# Patient Record
Sex: Female | Born: 1935
Health system: Southern US, Community
[De-identification: ages and names within clinical notes are randomized; demographics above are authoritative.]

## PROBLEM LIST (undated history)

## (undated) DIAGNOSIS — I493 Ventricular premature depolarization: Secondary | ICD-10-CM

## (undated) DIAGNOSIS — E785 Hyperlipidemia, unspecified: Secondary | ICD-10-CM

## (undated) DIAGNOSIS — E039 Hypothyroidism, unspecified: Secondary | ICD-10-CM

## (undated) DIAGNOSIS — I2510 Atherosclerotic heart disease of native coronary artery without angina pectoris: Secondary | ICD-10-CM

## (undated) DIAGNOSIS — I779 Disorder of arteries and arterioles, unspecified: Secondary | ICD-10-CM

## (undated) DIAGNOSIS — I447 Left bundle-branch block, unspecified: Secondary | ICD-10-CM

## (undated) DIAGNOSIS — R55 Syncope and collapse: Secondary | ICD-10-CM

## (undated) DIAGNOSIS — I5022 Chronic systolic (congestive) heart failure: Secondary | ICD-10-CM

## (undated) HISTORY — PX: KNEE ARTHROSCOPY: SHX127

## (undated) HISTORY — PX: FRACTURE SURGERY: SHX138

## (undated) HISTORY — PX: CATARACT EXTRACTION W/ INTRAOCULAR LENS  IMPLANT, BILATERAL: SHX1307

## (undated) HISTORY — PX: APPENDECTOMY: SHX54

## (undated) HISTORY — PX: BACK SURGERY: SHX140

## (undated) HISTORY — DX: Atherosclerotic heart disease of native coronary artery without angina pectoris: I25.10

## (undated) HISTORY — PX: WRIST FRACTURE SURGERY: SHX121

## (undated) HISTORY — DX: Hypothyroidism, unspecified: E03.9

## (undated) HISTORY — PX: OVARIAN CYST SURGERY: SHX726

## (undated) HISTORY — PX: CORONARY ANGIOPLASTY WITH STENT PLACEMENT: SHX49

---

## 1976-05-04 HISTORY — PX: LUMBAR LAMINECTOMY: SHX95

## 1998-11-21 ENCOUNTER — Other Ambulatory Visit: Admission: RE | Admit: 1998-11-21 | Discharge: 1998-11-21 | Payer: Self-pay | Admitting: Obstetrics and Gynecology

## 1998-12-30 ENCOUNTER — Encounter: Payer: Self-pay | Admitting: Cardiovascular Disease

## 1998-12-30 ENCOUNTER — Ambulatory Visit (HOSPITAL_COMMUNITY): Admission: RE | Admit: 1998-12-30 | Discharge: 1998-12-30 | Payer: Self-pay | Admitting: Cardiovascular Disease

## 1999-01-01 ENCOUNTER — Ambulatory Visit: Admission: RE | Admit: 1999-01-01 | Discharge: 1999-01-01 | Payer: Self-pay | Admitting: Cardiovascular Disease

## 1999-01-10 ENCOUNTER — Ambulatory Visit (HOSPITAL_COMMUNITY): Admission: RE | Admit: 1999-01-10 | Discharge: 1999-01-10 | Payer: Self-pay | Admitting: Cardiovascular Disease

## 2000-12-21 ENCOUNTER — Other Ambulatory Visit: Admission: RE | Admit: 2000-12-21 | Discharge: 2000-12-21 | Payer: Self-pay | Admitting: Obstetrics and Gynecology

## 2001-05-16 ENCOUNTER — Ambulatory Visit (HOSPITAL_COMMUNITY): Admission: RE | Admit: 2001-05-16 | Discharge: 2001-05-16 | Payer: Self-pay | Admitting: Gastroenterology

## 2003-11-13 ENCOUNTER — Inpatient Hospital Stay (HOSPITAL_COMMUNITY): Admission: EM | Admit: 2003-11-13 | Discharge: 2003-11-15 | Payer: Self-pay | Admitting: Emergency Medicine

## 2005-01-28 ENCOUNTER — Encounter: Admission: RE | Admit: 2005-01-28 | Discharge: 2005-01-28 | Payer: Self-pay | Admitting: Internal Medicine

## 2007-08-22 ENCOUNTER — Encounter: Admission: RE | Admit: 2007-08-22 | Discharge: 2007-08-22 | Payer: Self-pay | Admitting: Orthopedic Surgery

## 2007-09-01 ENCOUNTER — Encounter: Admission: RE | Admit: 2007-09-01 | Discharge: 2007-09-01 | Payer: Self-pay | Admitting: Orthopedic Surgery

## 2007-09-02 ENCOUNTER — Ambulatory Visit (HOSPITAL_BASED_OUTPATIENT_CLINIC_OR_DEPARTMENT_OTHER): Admission: RE | Admit: 2007-09-02 | Discharge: 2007-09-03 | Payer: Self-pay | Admitting: Orthopedic Surgery

## 2007-12-08 ENCOUNTER — Inpatient Hospital Stay (HOSPITAL_COMMUNITY): Admission: EM | Admit: 2007-12-08 | Discharge: 2007-12-09 | Payer: Self-pay | Admitting: Emergency Medicine

## 2007-12-08 ENCOUNTER — Other Ambulatory Visit: Payer: Self-pay | Admitting: Orthopedic Surgery

## 2008-03-08 ENCOUNTER — Ambulatory Visit (HOSPITAL_COMMUNITY): Admission: RE | Admit: 2008-03-08 | Discharge: 2008-03-08 | Payer: Self-pay | Admitting: Internal Medicine

## 2008-12-15 ENCOUNTER — Encounter: Admission: RE | Admit: 2008-12-15 | Discharge: 2008-12-15 | Payer: Self-pay | Admitting: Sports Medicine

## 2009-05-08 ENCOUNTER — Ambulatory Visit (HOSPITAL_COMMUNITY): Admission: RE | Admit: 2009-05-08 | Discharge: 2009-05-08 | Payer: Self-pay | Admitting: Internal Medicine

## 2009-12-31 ENCOUNTER — Ambulatory Visit: Payer: Self-pay | Admitting: Cardiovascular Disease

## 2010-01-23 ENCOUNTER — Observation Stay (HOSPITAL_COMMUNITY): Admission: EM | Admit: 2010-01-23 | Discharge: 2010-01-24 | Payer: Self-pay | Admitting: Emergency Medicine

## 2010-05-12 ENCOUNTER — Ambulatory Visit (HOSPITAL_COMMUNITY)
Admission: RE | Admit: 2010-05-12 | Discharge: 2010-05-12 | Payer: Self-pay | Source: Home / Self Care | Attending: Internal Medicine | Admitting: Internal Medicine

## 2010-07-17 ENCOUNTER — Ambulatory Visit: Payer: Self-pay | Admitting: Cardiovascular Disease

## 2010-07-17 LAB — PROTIME-INR
INR: 0.93 (ref 0.00–1.49)
Prothrombin Time: 12.7 seconds (ref 11.6–15.2)

## 2010-07-17 LAB — DIFFERENTIAL
Basophils Absolute: 0 10*3/uL (ref 0.0–0.1)
Lymphocytes Relative: 18 % (ref 12–46)
Monocytes Absolute: 0.4 10*3/uL (ref 0.1–1.0)
Neutro Abs: 5.7 10*3/uL (ref 1.7–7.7)
Neutrophils Relative %: 77 % (ref 43–77)

## 2010-07-17 LAB — BASIC METABOLIC PANEL
BUN: 10 mg/dL (ref 6–23)
Calcium: 9.3 mg/dL (ref 8.4–10.5)
GFR calc non Af Amer: >60 mL/min (ref >60)
Potassium: 3.7 mEq/L (ref 3.5–5.1)
Sodium: 142 mEq/L (ref 135–145)

## 2010-07-17 LAB — CBC
HCT: 37.3 % (ref 36.0–46.0)
RDW: 15.4 % (ref 11.5–15.5)
WBC: 7.4 10*3/uL (ref 4.0–10.5)

## 2010-07-17 LAB — TSH: TSH: 0.659 u[IU]/mL (ref 0.350–4.500)

## 2010-08-11 ENCOUNTER — Encounter: Payer: Self-pay | Admitting: Cardiovascular Disease

## 2010-08-11 ENCOUNTER — Ambulatory Visit (INDEPENDENT_AMBULATORY_CARE_PROVIDER_SITE_OTHER): Payer: Medicare Other | Admitting: Cardiovascular Disease

## 2010-08-11 DIAGNOSIS — I2510 Atherosclerotic heart disease of native coronary artery without angina pectoris: Secondary | ICD-10-CM | POA: Insufficient documentation

## 2010-08-11 DIAGNOSIS — E039 Hypothyroidism, unspecified: Secondary | ICD-10-CM | POA: Insufficient documentation

## 2010-08-11 DIAGNOSIS — E785 Hyperlipidemia, unspecified: Secondary | ICD-10-CM

## 2010-08-11 NOTE — Progress Notes (Signed)
History of Present Illness:  Susan [name] is an elderly female with a history of coronary artery disease-status post PTCA and stenting of her right coronary artery. She has a moderate stenosis in her proximal left anterior descending artery. She also has a history of Hypothyroidism.  She denies any episodes of chest pain or shortness breath. She still plays tennis on a regular basis.  Current Outpatient Prescriptions  Medication Sig Dispense Refill  . aspirin 81 MG tablet Take 81 mg by mouth daily.        . Calcium Carbonate-Vitamin D (CALCIUM + D PO) Take by mouth 2 (two) times daily.        . clopidogrel (PLAVIX) 75 MG tablet Take 75 mg by mouth daily.        . fish oil-omega-3 fatty acids 1000 MG capsule Take by mouth daily.        Marland Kitchen levothyroxine (SYNTHROID, LEVOTHROID) 50 MCG tablet Take 50 mcg by mouth daily.        . Multiple Vitamins-Minerals (PRESERVISION/LUTEIN PO) Take by mouth 2 (two) times daily.        . nitroGLYCERIN (NITROSTAT) 0.4 MG SL tablet Place 0.4 mg under the tongue every 5 (five) minutes as needed.        . rosuvastatin (CRESTOR) 20 MG tablet Take 20 mg by mouth daily.        Marland Kitchen venlafaxine (EFFEXOR-XR) 150 MG 24 hr capsule Take 150 mg by mouth daily.        . vitamin B-12 (CYANOCOBALAMIN) 1000 MCG tablet Take 1,000 mcg by mouth daily.        . Zoledronic Acid (RECLAST IV) Inject into the vein daily. YEARLY         No Known Allergies  Past Medical History  Diagnosis Date  . Coronary artery disease     POST PTCA AND STENTING OF HER RIGHT CORONARY ARTERY  . LAD stenosis     MODERATE 50-60% STENOSIS  . Hypothyroidism     Past Surgical History  Procedure Date  . Knee surgery     History  Smoking status  . Never Smoker   Smokeless tobacco  . Not on file    History  Alcohol Use No    Family History  Problem Relation Age of Onset  . Alzheimer's disease Mother   . Heart attack Father   . Aneurysm Sister     Reviw of Systems:  The patient denies any  heat or cold intolerance.  No weight gain or weight loss.  The patient denies headaches or blurry vision.  There is no cough or sputum production.  The patient denies dizziness.  There is no hematuria or hematochezia.  The patient denies any muscle aches or arthritis.  The patient denies any rash.  The patient denies frequent falling or instability.  There is no history of depression or anxiety.  All other systems were reviewed and are negative.  Physical Exam: BP 130/70  Pulse 76  Ht 5\' 3"  (1.6 m)  Wt 133 lb 3.2 oz (60.419 kg)  BMI 23.60 kg/m2 The patient is alert and oriented x 3.  The mood and affect are normal.  The skin is warm and dry.  Color is normal.  The HEENT exam reveals that the sclera are nonicteric.  The mucous membranes are moist.  The carotids are 2+ without bruits.  There is no thyromegaly.  There is no JVD.  The lungs are clear.  The chest wall is non tender.  The  heart exam reveals a regular rate with a normal S1 and S2.  There are no murmurs, gallops, or rubs.  The PMI is not displaced.   Abdominal exam reveals good bowel sounds.  There is no guarding or rebound.  There is no hepatosplenomegaly or tenderness.  There are no masses.  Exam of the legs reveal no clubbing, cyanosis, or edema.  The legs are without rashes.  The distal pulses are intact.  Cranial nerves II - XII are intact.  Motor and sensory functions are intact.  The gait is normal.  Assessment / Plan:

## 2010-08-11 NOTE — Assessment & Plan Note (Signed)
Susan [name] is doing very well. I'm pleased that she is not having any episodes chest pain. We will continue with the same medications. I'll see her again in 9 months for office visit, lipid profile, basic metabolic profile, and hepatic profile.

## 2010-08-16 ENCOUNTER — Emergency Department (HOSPITAL_COMMUNITY): Payer: Medicare Other

## 2010-08-16 ENCOUNTER — Inpatient Hospital Stay (HOSPITAL_COMMUNITY)
Admission: EM | Admit: 2010-08-16 | Discharge: 2010-08-19 | DRG: 287 | Disposition: A | Discharge status: Home / Self Care | Payer: Medicare Other | Attending: Endocrinology | Admitting: Endocrinology

## 2010-08-16 DIAGNOSIS — I5020 Unspecified systolic (congestive) heart failure: Secondary | ICD-10-CM | POA: Diagnosis present

## 2010-08-16 DIAGNOSIS — R55 Syncope and collapse: Secondary | ICD-10-CM

## 2010-08-16 DIAGNOSIS — E785 Hyperlipidemia, unspecified: Secondary | ICD-10-CM | POA: Diagnosis present

## 2010-08-16 DIAGNOSIS — E538 Deficiency of other specified B group vitamins: Secondary | ICD-10-CM | POA: Diagnosis present

## 2010-08-16 DIAGNOSIS — I509 Heart failure, unspecified: Secondary | ICD-10-CM | POA: Diagnosis present

## 2010-08-16 DIAGNOSIS — E039 Hypothyroidism, unspecified: Secondary | ICD-10-CM | POA: Diagnosis present

## 2010-08-16 DIAGNOSIS — E559 Vitamin D deficiency, unspecified: Secondary | ICD-10-CM | POA: Diagnosis present

## 2010-08-16 DIAGNOSIS — F3289 Other specified depressive episodes: Secondary | ICD-10-CM | POA: Diagnosis present

## 2010-08-16 DIAGNOSIS — F329 Major depressive disorder, single episode, unspecified: Secondary | ICD-10-CM | POA: Diagnosis present

## 2010-08-16 DIAGNOSIS — Z7902 Long term (current) use of antithrombotics/antiplatelets: Secondary | ICD-10-CM

## 2010-08-16 DIAGNOSIS — H3530 Unspecified macular degeneration: Secondary | ICD-10-CM | POA: Diagnosis present

## 2010-08-16 DIAGNOSIS — Z7982 Long term (current) use of aspirin: Secondary | ICD-10-CM

## 2010-08-16 DIAGNOSIS — M810 Age-related osteoporosis without current pathological fracture: Secondary | ICD-10-CM | POA: Diagnosis present

## 2010-08-16 LAB — POCT CARDIAC MARKERS
CKMB, poc: 2.2 ng/mL (ref 1.0–8.0)
Myoglobin, poc: 226 ng/mL (ref 12–200)
Troponin i, poc: <0.05 ng/mL (ref 0.00–0.09)

## 2010-08-16 LAB — TROPONIN I: Troponin I: 0.01 ng/mL (ref 0.00–0.06)

## 2010-08-16 LAB — CK TOTAL AND CKMB (NOT AT ARMC)
Relative Index: INVALID (ref 0.0–2.5)
Total CK: 75 U/L (ref 7–177)

## 2010-08-16 LAB — URINALYSIS, ROUTINE W REFLEX MICROSCOPIC
Glucose, UA: NEGATIVE mg/dL
Hgb urine dipstick: NEGATIVE
Protein, ur: NEGATIVE mg/dL
Specific Gravity, Urine: 1.008 (ref 1.005–1.030)
Urobilinogen, UA: 0.2 mg/dL (ref 0.0–1.0)

## 2010-08-16 LAB — CARDIAC PANEL(CRET KIN+CKTOT+MB+TROPI)
CK, MB: 2.3 ng/mL (ref 0.3–4.0)
Total CK: 80 U/L (ref 7–177)
Troponin I: 0.03 ng/mL (ref 0.00–0.06)

## 2010-08-16 LAB — CBC
Platelets: 240 10*3/uL (ref 150–400)
RBC: 3.93 MIL/uL (ref 3.87–5.11)
RDW: 13.9 % (ref 11.5–15.5)
WBC: 5.9 10*3/uL (ref 4.0–10.5)

## 2010-08-16 LAB — COMPREHENSIVE METABOLIC PANEL
Albumin: 3.8 g/dL (ref 3.5–5.2)
BUN: 11 mg/dL (ref 6–23)
Creatinine, Ser: 0.9 mg/dL (ref 0.4–1.2)
Glucose, Bld: 92 mg/dL (ref 70–99)
Total Protein: 6.9 g/dL (ref 6.0–8.3)

## 2010-08-16 LAB — DIFFERENTIAL
Basophils Absolute: 0 10*3/uL (ref 0.0–0.1)
Basophils Relative: 1 % (ref 0–1)
Eosinophils Absolute: 0.2 10*3/uL (ref 0.0–0.7)
Eosinophils Relative: 3 % (ref 0–5)
Lymphs Abs: 2.4 10*3/uL (ref 0.7–4.0)
Neutrophils Relative %: 49 % (ref 43–77)

## 2010-08-18 DIAGNOSIS — I2510 Atherosclerotic heart disease of native coronary artery without angina pectoris: Secondary | ICD-10-CM

## 2010-08-18 LAB — BASIC METABOLIC PANEL
CO2: 24 mEq/L (ref 19–32)
GFR calc non Af Amer: >60 mL/min (ref >60)
Glucose, Bld: 86 mg/dL (ref 70–99)
Potassium: 4.7 mEq/L (ref 3.5–5.1)
Sodium: 138 mEq/L (ref 135–145)

## 2010-08-18 LAB — PROTIME-INR: INR: 0.95 (ref 0.00–1.49)

## 2010-08-18 LAB — CARDIAC PANEL(CRET KIN+CKTOT+MB+TROPI)
CK, MB: 2 ng/mL (ref 0.3–4.0)
Relative Index: INVALID (ref 0.0–2.5)
Total CK: 74 U/L (ref 7–177)

## 2010-08-18 LAB — GLUCOSE, CAPILLARY: Glucose-Capillary: 89 mg/dL (ref 70–99)

## 2010-08-18 LAB — APTT: aPTT: 31 seconds (ref 24–37)

## 2010-08-19 DIAGNOSIS — I059 Rheumatic mitral valve disease, unspecified: Secondary | ICD-10-CM

## 2010-08-19 LAB — CBC
Hemoglobin: 11.9 g/dL — ABNORMAL LOW (ref 12.0–15.0)
MCH: 32 pg (ref 26.0–34.0)
RBC: 3.72 MIL/uL — ABNORMAL LOW (ref 3.87–5.11)
WBC: 6.3 10*3/uL (ref 4.0–10.5)

## 2010-08-19 LAB — BASIC METABOLIC PANEL
CO2: 25 mEq/L (ref 19–32)
Chloride: 108 mEq/L (ref 96–112)
Creatinine, Ser: 0.93 mg/dL (ref 0.4–1.2)
GFR calc Af Amer: >60 mL/min (ref >60)
Potassium: 4.2 mEq/L (ref 3.5–5.1)

## 2010-08-20 NOTE — Cardiovascular Report (Signed)
  NAMESELA, FALK              ACCOUNT NO.:  [id number]  MEDICAL RECORD NO.:  [id number]           PATIENT TYPE:  I  LOCATION:  3739                         FACILITY:  [facility]  PHYSICIAN:  Verne Carrow, MDDATE OF BIRTH:  11-24-1933  DATE OF PROCEDURE:  08/18/2010 DATE OF DISCHARGE:                           CARDIAC CATHETERIZATION   PRIMARY CARDIOLOGIST:  Vesta Mixer, MD  PRIMARY CARE PHYSICIAN:  Redge Gainer. Perini, MD  PROCEDURES PERFORMED: 1. Left heart catheterization. 2. Selective coronary angiography. 3. Left ventricular angiogram.  OPERATOR:  Verne Carrow, MD  INDICATIONS:  This is a 77 year old Caucasian female with a history of coronary artery disease and hyperlipidemia who underwent catheterization with placement of 2 drug-eluting stents in the right coronary artery in July 2005.  She was admitted to the hospital after a syncopal episode. Her cardiac enzymes were negative.  Cardiac catheterization was arranged because the patient's presentation prior to her last catheterization in 2005 with syncope.  PROCEDURE IN DETAIL:  The patient was brought to the Main Cardiac Catheterization Laboratory after signing informed consent for the procedure.  The right groin was prepped and draped in a sterile fashion. Lidocaine 1% was used for local anesthesia.  A 5-French sheath was inserted into the right femoral artery without difficulty.  Standard diagnostic catheters were used to perform selective coronary angiography.  A pigtail catheter was used to perform a left ventricular angiogram.  The patient tolerated the procedure well.  She was taken to the recovery area in stable condition.  There were no immediate complications.  HEMODYNAMIC FINDINGS:  Central aortic pressure 155/85.  Left ventricular pressure 151/6/10.  ANGIOGRAPHIC FINDINGS: 1. The left main coronary artery had no evidence of disease. 2. The left anterior descending was a moderate-sized  vessel in its     proximal portion and tapered to a smaller caliber vessel throughout     the mid and distal portion.  The midvessel had a long tubular 50%     stenosis.  This appeared to be unchanged from film in 2005. 3. The circumflex artery had minor luminal irregularities. 4. The right coronary artery was a large dominant vessel with 30%     proximal stenosis just prior to the stent in the mid segment.  The     stent in the mid segment was patent with no evidence of restenosis.     The stent in the distal segment was patent with no restenosis. 5. Left ventricular angiogram was performed in the RAO projection and     showed mild left ventricular systolic dysfunction with ejection     fraction of 45-50%.  IMPRESSION: 1. Double-vessel coronary artery disease, stable. 2. Mild left ventricular systolic dysfunction.  RECOMMENDATIONS:  I would recommend continued medical management at this time.  We will check a 2-D echocardiogram in the morning.     Verne Carrow, MD     CM/MEDQ  D:  08/18/2010  T:  08/19/2010  Job:  [id number]  cc:   Vesta Mixer, M.D. Redge Gainer. Perini, M.D.  Electronically Signed by Verne Carrow MD on 08/20/2010 01:58:27 PM

## 2010-08-21 NOTE — Consult Note (Signed)
NAMESHIRELY, TOREN              ACCOUNT NO.:  [id number]  MEDICAL RECORD NO.:  [id number]           PATIENT TYPE:  I  LOCATION:  3739                         FACILITY:  [facility]  PHYSICIAN:  Corbin Falck C. Eden Emms, MD, FACCDATE OF BIRTH:  02/24/1934  DATE OF CONSULTATION:  08/16/2010 DATE OF DISCHARGE:                                CONSULTATION   PRIMARY CARE PHYSICIAN:  Mark A. Perini, MD  PRIMARY CARDIOLOGIST:  Vesta Mixer, MD  CHIEF COMPLAINT:  Syncope.  HISTORY OF PRESENT ILLNESS:  Susan [name] is a 77 year old female with a history of coronary artery disease.  She was in her usual state of health until yesterday when she had general malaise all day.  She had no specific complaints.  Her BUN take was poor because she just did not feel like eating, but she was not really nauseated.  She did have some additional belching.  She was able to do her usual activities.  This a.m., she got out of bed and took her Synthroid first thing without eating as usual.  When she got up and went to the bathroom, she felt presyncopal and laid on the floor.  She had a complete loss of consciousness of unknown, but probably brief duration.  When she woke up, she was concerned and still felt weak, so she called 911 and came to the emergency room.  She had similar symptoms prior to her stent to the RCA in 2005 and was concerned about this.  PAST MEDICAL HISTORY: 1. Status post cardiac catheterization in July 2005 with LAD 60%,     circumflex normal, RCA mid 80% treated with PTCA and 2.75 x 16-mm     Taxus drug-eluting stent reducing the stenosis to 0, 60% distal RCA     and 20% PDA.  Her EF was normal, although the anterior apical wall     was felt to be slightly hypokinetic. 2. Hyperlipidemia. 3. Family history of coronary artery disease. 4. Hypothyroidism. 5. History of migraines. 6. History of macular degeneration. 7. History of osteoporosis. 8. History of depression since her husband  died.  SURGICAL HISTORY:  She is status post wrist surgery x2 after trauma, cardiac catheterization, knee surgery, lower back surgery and thyroid surgery.  ALLERGIES:  No known drug allergies.  CURRENT MEDICATIONS: 1. Reclast injection one see year. 2. Venlafaxine daily. 3. Plavix 75 mg a day. 4. Ocuvite b.i.d. 5. Fish oil daily. 6. Vitamin B12 daily. 7. Crestor 20 mg a day. 8. Calcium plus D daily. 9. Aspirin 81 mg a day. 10.Levoxyl 50 mcg daily. 11.Tylenol p.r.n.  SOCIAL HISTORY:  She lives in [place] alone since her husband died. She is a retired Haematologist.  She has no history of alcohol, tobacco or drug abuse.  She plays tennis three times a week and mows her yard without symptoms.  FAMILY HISTORY:  Mother died at 21 with coronary artery disease and respiratory issues.  Her father died at 58 with heart disease and she had one brother that died in his 50s with an MI.  REVIEW OF SYSTEMS:  Her depression is better on medications.  She  rarely has arthralgias.  She has problems with constipation, which she treats with fiber and high fiber foods.  She has had no recent illnesses, fevers or chills.  Full 14-point review of systems is otherwise negative except as stated in the HPI.  PHYSICAL EXAMINATION:  VITAL SIGNS:  Temperature is 98.2, blood pressure lying 133/83, heart rate 68; sitting 144/91, heart rate 78; standing 116/82, heart rate 101 with dizziness; respiratory rate 18; O2 saturation 100% on room air. GENERAL:  She is a well-developed elderly white female in no acute distress at rest. HEENT:  Normal. NECK:  There is no lymphadenopathy, thyromegaly, bruit or JVD noted. CV:  Her heart is regular in rate and rhythm with an S1-S2 and no significant murmur, rub, or gallop is noted.  Distal pulses are intact in all four extremities. LUNGS:  Clear to auscultation bilaterally. SKIN:  No rashes or lesions are noted. ABDOMEN:  Soft and nontender with active bowel  sounds. EXTREMITIES:  There is no cyanosis, clubbing or edema noted. MUSCULOSKELETAL:  There is no joint deformity or effusions and no spine or CVA tenderness noted. NEUROLOGIC:  She is alert and oriented.  Cranial nerves II through XII grossly intact.  Chest x-ray, no acute disease.  EKG; sinus rhythm, rate 78, with no acute ischemic changes.  LABORATORY VALUES:  Hemoglobin 12.8, hematocrit 37.6, WBCs 5.9, platelets 240.  Sodium 138, potassium 3.8, chloride 106, CO2 25, BUN 11, creatinine 0.9, glucose 92.  CK-MB and troponin-I negative x1.  Initial point-of-care markers negative except for an elevated myoglobin at 226 and urinalysis was negative.  IMPRESSION:  Susan [name] was seen today by Dr. Eden Emms, the patient evaluated and the data reviewed.  She is a 77 year old female with a history of coronary artery disease.  She had exactly the same symptoms today that she had prior to her previous stent in 2005.  At that time, she had moderate left anterior descending artery disease.  The risks and benefits of treatments were discussed with the patient and her daughter. Cardiac catheterization is recommended and the patient and her daughter indicated understanding and agreed with proceed.  We will add heparin and IV nitroglycerin if her enzymes elevate and make sure she is on full strength aspirin.  We will continue her on Plavix and add a low- dose beta-blocker if her blood pressure and heart rate will tolerate. Her orthostatics were positive and she is being hydrated by Dr. Evlyn Kanner. We will continue to follow her closely with you.     Theodore Demark, PA-C   ______________________________ Noralyn Pick. Eden Emms, MD, [facility]    RB/MEDQ  D:  08/16/2010  T:  08/17/2010  Job:  [id number]  Electronically Signed by Theodore Demark PA-C on 08/20/2010 10:10:42 AM Electronically Signed by Charlton Haws MD FACC on 08/21/2010 11:32:18 AM

## 2010-09-02 NOTE — Discharge Summary (Signed)
[name], [name]              ACCOUNT NO.:  [id number]  MEDICAL RECORD NO.:  [id number]           PATIENT TYPE:  I  LOCATION:  3739                         FACILITY:  [facility]  PHYSICIAN:  Carsen Machi A. Kiet Geer, M.D.   DATE OF BIRTH:  1934/04/22  DATE OF ADMISSION:  08/16/2010 DATE OF DISCHARGE:  08/19/2010                              DISCHARGE SUMMARY   DISCHARGE DIAGNOSES: 1. Syncope of unclear cause.  She may have been somewhat     intravascularly depleted from being somewhat dehydrated from     activity. 2. Atherosclerotic coronary disease status post cardiac     catheterization this admission showed patent stent and moderate     coronary disease, otherwise 3. Mild systolic congestive heart failure by cardiac catheterization.     Left ventricular function data, however, 2-D echocardiogram     appeared essentially normal. 4. Hyperlipidemia on treatment. 5. Hypothyroidism. 6. Osteoporosis. 7. Chronic depression. 8. Vitamin D deficiency. 9. Vitamin B12 deficiency. 10.Macular degeneration.  PROCEDURES: 1. Cardiac catheterization on August 18, 2010 showing stable double-     vessel coronary artery disease and mild left ventricular systolic     dysfunction with an EF of 45-50%.  However, 2-D echo on August 19, 2010 showed normal left ventricular size with mild left ventricular     hypertrophy with normal systolic function with an ejection fraction     of 55-60% with no regional wall motion abnormalities and grade 1     diastolic dysfunction.  There was mild mitral regurgitation,     trivial pulmonic regurgitation, mild tricuspid regurgitation.  CT     scan of the head without contrast showed no intracranial     abnormality that was acute.  She had stable atrophy with mild white     matter disease, chronic right maxillary sinus disease.  There was     mild prominence of the pituitary gland but it was difficult to     assess on the noncontrasted CT.  A nonemergent MRI could be  done if     felt necessary for further evaluation.  DISCHARGE MEDICATIONS: 1. Tylenol 650 mg every 6 hours as needed. 2. Aspirin 81 mg daily. 3. Calcium with D 1 tablet daily with food. 4. Crestor 20 mg daily. 5. Vitamin B12 250 mcg daily long-term. 6. Fish oil 1 tablet daily. 7. Levoxyl 50 mcg daily. 8. Ocuvite 1 tablet twice daily. 9. Plavix 75 mg daily. 10.Effexor XR 150 mg daily. 11.Reclast injections once yearly for osteoporosis.  HISTORY OF PRESENT ILLNESS:  Susan [name] is a pleasant 77 year old female who had been in her usual state of health up.  She had mowing her lawn and playing tennis and volunteering frequently.  The day before admission, she had a general sense of malaise and sort of an out of body feeling that she relates to the way she felt prior to her previous presentation of cardiac disease in 2005.  She went to bed the night before admission and felt fairly normal but when she awoke, she still had this vague sense of malaise.  She went to sit on  the toilet and had a feeling that she was going to pass out.  She was able to lower herself carefully to the floor and did not fall or injure herself.  She is not sure if she totally passed out.  She had no incontinence. Symptoms did resolve.  She had slight diarrhea the day before admission but no fevers, chills, or sweats.  She presented to the emergency room and was admitted for further evaluation.  HOSPITAL COURSE:  Yaret was admitted to a telemetry bed.  She remained stable from a cardiovascular and pulmonary standpoint during her admission and there were no concerning findings on telemetry.  She did have occasional PVCs but was otherwise in normal sinus rhythm. Cardiology was consulted and a cardiac catheterization was performed with results as noted above.  A 2-D echocardiogram was also performed. Fortunately all of these appeared relatively benign.  There were no new findings or cause of syncope found.   Therefore on August 19, 2010, she was deemed stable for discharge home.  DISCHARGE PHYSICAL EXAM:  VITAL SIGNS:  Temperature 97.9, afebrile, pulse 56, blood pressure 112/75, respiratory rate 18, 95% saturation on room air. GENERAL:  She is in no acute distress. LUNGS:  Clear to auscultation bilaterally with no wheezes, rales, or rhonchi. HEART:  Regular rate and rhythm with no murmur, rub, or gallop. ABDOMEN:  Soft, nontender, nondistended with no edema.  She moved extremities x4 and was grossly neurologically intact and she was ambulating in the halls prior to discharge.  DISCHARGE LABORATORY DATA:  On August 19, 2010, sodium was 139, potassium 4.2, chloride 108, CO2 25, BUN 12, creatinine 0.93, GFR 59, glucose 86, calcium 9.1, white count 6.3, hemoglobin 11.9, platelet count 219,000, MCV 95.7, platelet function P2Y12 was 177 which is low, P2Y12 percent inhibition was 54%, platelet baseline function was 387.  PTT, PT, and INR were all normal on August 18, 2010.  TSH was 1.33.  Cardiac enzymes remained negative.  Urinalysis was negative upon admission.  DISCHARGE INSTRUCTIONS:  Malessa is to follow a low-salt, heart-healthy diet.  She is to increase her activity slowly.  She is to take care of her cath wound per cardiology instructions.  She is to follow with Dr. Waynard Edwards with her June 2012 physical and keep her next visit with Dr. Eden Emms and is to call or return to the emergency room with any recurrent problems.  A 40 minutes were spent in the discharge process on the unit with the patient.     Jaystin Mcgarvey A. Waynard Edwards, M.D.     MAP/MEDQ  D:  08/21/2010  T:  08/21/2010  Job:  [id number]  Electronically Signed by Rodrigo Ran M.D. on 09/02/2010 08:04:15 AM

## 2010-09-08 NOTE — H&P (Signed)
Susan [name], Susan [name]              ACCOUNT NO.:  [id number]  MEDICAL RECORD NO.:  [id number]           PATIENT TYPE:  E  LOCATION:  [facility]                         FACILITY:  [facility]  PHYSICIAN:  Tera Mater. [place], M.D. DATE OF BIRTH:  10-03-1933  DATE OF ADMISSION:  08/16/2010 DATE OF DISCHARGE:                             HISTORY & PHYSICAL   HISTORY OF PRESENT ILLNESS:  This is a 77 year old white female with a history of coronary artery disease, hypertension, hyperlipidemia, who presents today after syncopal episode.  She has been in her usual state of good health last 3-4 weeks.  She has been mowing her lawn, she has been playing competitive tennis and volunteering frequently.  Yesterday, she had a general sense of malaise, sort of an out-of-body kind of feeling that she relates to the way she felt when she previously presented with cardiac disease.  She did volunteer again last evening, went to bed at the normal time, woke up this morning, feeling reasonably well, but still is vague malaise persisted.  She went to the bathroom to take her Levoxyl and was sitting on the toilet and had this sensation that she was going to pass out.  She was able to lower herself carefully to the floor and did not fall or injure herself in anyway.  She is sure that she totally passed out, the length of time is undeterminable since she was thereby herself.  There is no evidence of any incontinence.  No soreness of her tongue or lips.  No pain in her extremities as if she had hit anything.  No muscle soreness to suggest a seizure-like event. She did have some pain in her left face and some tingling around her mouth.  This is now resolved partially.  She still has some vague uneasy feeling in her left face.  She again has been able to be fairly active. She has had no exertional angina.  She did have a little diarrhea yesterday.  She has had no fevers, chills, or sweats.  She had a tiny amount of nausea  this morning.  Her vision has been stable.  She had no gait disorder prior to this.  After awakening, she did call her aunts and niece to a phone to call 911.  At that time, she was able to communicate effectively with them with no evidence of any dysarthria or other deficits.  She has not had a passing out episode before this she is aware of.  Again, she has been doing well relatively recently.  PAST MEDICAL HISTORY:  Primary hypothyroidism, osteoporosis, hyperlipidemia, macular generation, depression, coronary artery disease with a stent back in 2005, vitamin D deficiency, vitamin B12 deficiency, seasonal allergic rhinitis, history of endometriosis.  Her cath in 2005 did show LAD, there was 60% stenosis and was irregular, left circumflex was fairly normal, right coronary was large and dominant with the proximal 20-30 stenosis followed by an 80%, some luminal irregularities, PDA had 20-30% ejection fraction, is not discussed, there were stents placed to the mid RCA and distal RCA, left ventricular systolic function was normal.  I do not see any cardiac  studies after that in the [facility] system.  Her last admission here was in 2011 when I admitted her with headache and new onset of hypertension.  PAST SURGICAL HISTORY:  Right wrist fracture twice since 1997, unilateral oophorectomy in 1971, and ruptured disk back in 1977, appendectomy in 1971, cataract removal of the left eye, and left wrist fracture most recently in April 2009.  SOCIAL HISTORY:  She has been widowed since September 2010.  She has three children, three grandchildren.  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  Father died at 5 of heart tach.  Mother died at 43. Brother died at 20, heart disease.  Sister died at 39.  MEDICATIONS:  She comes to [place] today on the medications including, 1. Effexor XR 150 once daily. 2. Plavix 75 once daily. 3. Crestor 20 mg once daily. 4. Levothyroxine 50 mcg once daily and p.r.n. Robaxin.   I do see that     she had been on Reclast in the past.  PHYSICAL EXAMINATION:  VITAL SIGNS:  On exam here in the emergency room, blood pressure is 155/78, pulse was 72 and what appears to be a sinus rhythm with occasional PVCs, respirations 60 and unlabored, O2 sat is 100% on room air. GENERAL:  She is well tanned and seems a bit uncomfortable and anxious, a bit tearful. HEENT:  Sclerae anicteric.  The left pupil does oscillate a little where the cataract repair has been done.  Extraocular movements were intact with no nystagmus.  She has conjugate gaze.  Left face appears symmetric to the right.  She says the sensation is down a little bit.  I do not see any flattening or nasal labial fold.  Oral mucous membranes are moist.  No evidence of tongue or teeth trauma is present. NECK:  Supple.  I cannot appreciate any bruits.  No thyromegaly is present. LUNGS:  Clear without wheezes, rales, or rhonchi. HEART:  Regular.  I can hear occasional premature beats.  There is a soft murmur. ABDOMEN:  Soft and nontender.  No splenomegaly is present.  Bowel sounds are normoactive. EXTREMITIES:  Distal pulses are strong.  There is no edema.  There is no evidence of any trauma of her hands or feet or arms or legs. NEUROLOGIC:  The patient is awake and alert.  She has clear low volume speech.  Her mentation appears intact.  Speech is fluent.  Grip is equal bilaterally.  Gait was not tested.  She has no resting tremor.  LABORATORY DATA:  Lab data thus far she has had is essentially normal workup including a UA, which was negative.  Chemistries showed a sodium 138, potassium 3.8, chloride 106, CO2 of 25, BUN 11, creatinine 0.90, glucose of 92.  Estimated GFR greater than 60.  Her alk phos is 56, AST is 26, ALT is 15, albumin is 3.8, total protein is 6.9, calcium is 9. Estimated GFR is greater than 60.  First cardiac markers, a point of care myoglobin is 226, slightly elevated.  Troponin is less than  0.05. CK-MB is 2.2.  White count is 5900, hemoglobin 12.8, platelets 240,000. Radiology imaging already completed.  A two-view chest shows stable nodular density in the right base.  No changes since 2009.  No acute cardiopulmonary abnormalities.  CT of the head has been done today, which shows mild prominence of pituitary gland, stable atrophy, mild white matter disease, chronic right maxillary sinusitis, no acute intracranial abnormality or significant interval change.  EKG is sinus rhythm  with no ischemic changes.  ASSESSMENT AND PLAN:  In summary, we have a 77 year old white female with known cardiac disease, prior hypertension, hyperlipidemia, presenting now with a syncopal episode.  It is quite unusual, she did have a very clear prodrome, which gave her enough time to get to the floor and was out for undetermined amount of time.  There is no clear evidence of any seizure activity.  She had no incontinence, tongue biting, or muscle soreness.  Her myoglobin is not up significantly.  Her electrolytes all look fine.  Her EKG monitor shows some PVCs that seem to be unifocal and not any runs.  I wonder if this is perhaps a vasovagal episode, arrhythmia, or Stokes-Adams attack.  The face pain is bit unusual.  CT acutely is negative for any bleeding or tumor.  She may need an MRI if this persists.  I will get Cardiology to look over my shoulder just given that she presented before with cardiac symptoms that were vague like this.  First set of enzymes, however, are negative. Blood pressure is up a bit, but not too bad for been in the emergency room after a syncopal episode.  She is not currently on therapy.  Her thyroid disease will be checked with a TSH.  Her lipid management is done as an outpatient.  The patient will be watched on telemetry, Lovenox, continuation of Plavix, and some gentle hydration will be given.  We will keep her at bedrest for the present time.           ______________________________ Tera Mater Evlyn Kanner, M.D.     SAS/MEDQ  D:  08/16/2010  T:  08/16/2010  Job:  [id number]  Electronically Signed by Adrian Prince M.D. on 09/08/2010 12:45:25 PM

## 2010-09-16 NOTE — Op Note (Signed)
Susan [name], Susan [name]              ACCOUNT NO.:  [id number]   MEDICAL RECORD NO.:  [id number]          PATIENT TYPE:  AMB   LOCATION:  [facility]                          FACILITY:  [facility]   PHYSICIAN:  Cindee Salt, M.D.       DATE OF BIRTH:  Jan 16, 1934   DATE OF PROCEDURE:  12/08/2007  DATE OF DISCHARGE:                               OPERATIVE REPORT   PREOPERATIVE DIAGNOSIS:  Status post open reduction and internal  fixation left distal radius fracture with a volar Orthofix plate with  collapse of the radial styloid penetration of one screw.   POSTOPERATIVE DIAGNOSIS:  Status post open reduction and internal  fixation left distal radius fracture with a volar Orthofix plate with  collapse of the radial styloid penetration of one screw.   OPERATION:  Removal of dorsal distal volar radius plate, left wrist.   SURGEON:  Cindee Salt, MD.   ASSISTANT:  None.   ANESTHESIA:  General.   DATE OF OPERATION:  December 08, 2007.   ANESTHESIOLOGIST:  Kaylyn Layer. Michelle Piper, MD   HISTORY:  The patient is a 77 year old female with a history of an intra-  articular fracture of her left distal radius which underwent open  reduction and internal fixation.  The radial styloid fragment collapsed  with penetration of one screw.  She is admitted now for removal of the  plate and screws.  She is aware of the risks and complications including  infection, recurrence injury to arteries, nerves, and tendons, complete  relief of symptoms, and dystrophy.  In the preoperative area, the  patient is seen, the extremity marked by both patient and surgeon.  Antibiotic given.   PROCEDURE:  The patient was brought to the operating room and general  anesthetic carried out under the direction of Dr. Michelle Piper.  She was  prepped using DuraPrep, supine position, left arm free.  The limb was  exsanguinated after a time-out.  A tourniquet placed high on the upper  arm was inflated to 250 mmHg.  The old incision was used, carried down  through the subcutaneous tissue.  Bleeders were electrocauterized.  Dissection carried down through the flexor carpi radialis sheath.  The  flexor pollicis longus was elevated.  The pronator quadratus has healed,  was intact.  This was incised on its radial aspect.  The plate and  screws were removed without difficulty.  The fracture was healed.  X-  rays confirmed this.  The wound irrigated.  The pronator quadratus  repaired with figure-of-eight 4-0 Vicryl sutures.  The subcutaneous  tissue with interrupted 4-0 Vicryl and the skin with interrupted 5-0  Vicryl Rapide  sutures.  Sterile compressive dressing and splint to the wrist was  applied.  The patient tolerated the procedure well and was taken to the  recovery room for observation in satisfactory condition.  She will be  discharged home to return to the [facility] in 1 week, on  Vicodin.           ______________________________  Cindee Salt, M.D.     GK/MEDQ  D:  12/08/2007  T:  12/09/2007  Job:  [id number]
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
## 2010-09-16 NOTE — Discharge Summary (Signed)
[name], Susan              ACCOUNT NO.:  [id number]   MEDICAL RECORD NO.:  [id number]          PATIENT TYPE:  INP   LOCATION:  2629                         FACILITY:  [facility]   PHYSICIAN:  Colleen Can. Deborah Chalk, M.D.DATE OF BIRTH:  1933-12-31   DATE OF ADMISSION:  12/08/2007  DATE OF DISCHARGE:  12/09/2007                               DISCHARGE SUMMARY   DISCHARGE DIAGNOSES:  1. Postoperative chest pain.  2. Revised DVR plate to the left radius per Dr. Merlyn Lot.  3. Known ischemic heart disease with previous stenting of the right      coronary in July 2005 with known residual left anterior descending      disease.  4. Previous left first wrist fracture in April 2009.  5. Hyperlipidemia on Crestor.  6. Hypothyroidism on replacement.   HISTORY OF PRESENT ILLNESS:  Susan [name] is a very pleasant 77 year old  female who [facility]  Emergency Room with chest discomfort.  She has had revision of right  plate in her left radius from a previous fracture that she suffered back  in April.  Following this surgery, she had chest heaviness.  She did  have some shortness of breath.  She was treated with nitroglycerin and  Dilaudid and she was subsequently transferred to the emergency room and  admitted for overnight observation.  This discomfort is not like her  previous presentation when she had her stent placement in 2005.  At that  time, she had syncope.   Please see history and physical for further patient presentation and  profile.   LABORATORY DATA ON ADMISSION:  Her TSH is normal.  Her CBC is normal.  Her chemistries are normal except for a glucose of 113.  Cardiac enzymes  are all negative x3.  Urinalysis is negative.  Chest x-ray showed no  acute disease and EKG showed no acute findings.   HOSPITAL COURSE:  The patient was admitted.  She was placed on Nitrol  paste and ruled out per serial enzymes.  She had significant headache  with subsequent nausea from the nitroglycerin which was subsequently  discontinued.  She ruled out negative for myocardial infarction and  today on December 09, 2007, she is basically doing well with no further  episodes of chest discomfort.  She is felt to be a satisfactory  candidate for discharge with probable outpatient evaluation.   DISCHARGE CONDITION:  Stable.   DISCHARGE DIET:  Heart healthy.   DISCHARGE MEDICINES:  All that she was taking before per the med rec  sheet, which include;  1. Crestor 20 mg a day.  2. Plavix 75 mg a day.  3. Levoxyl 50 mg a day.  4. Fish oil daily.  5. Calcium supplement daily.  6. Aspirin 81 mg a day.  7. Ocuvite drops daily.   We will be adding Pepcid 40 mg a day as well as nitroglycerin sublingual  p.r.n.   We will have her see Dr. Kristeen Miss in the office next week for  further evaluation.  She is to call if any problems arise  in the  interim.      Sharlee Blew, N.P.      Colleen Can. Deborah Chalk, M.D.  Electronically Signed    LC/MEDQ  D:  12/09/2007  T:  12/09/2007  Job:  [id number]   cc:   Vesta Mixer, M.D.

## 2010-09-19 NOTE — Cardiovascular Report (Signed)
NAME:  Susan [name], Susan [name]                        ACCOUNT NO.:  [id number]   MEDICAL RECORD NO.:  [id number]                   PATIENT TYPE:  INP   LOCATION:  3736                                 FACILITY:  [facility]   PHYSICIAN:  Vesta Mixer, M.D.              DATE OF BIRTH:  [birth year]   DATE OF PROCEDURE:  11/14/2003  DATE OF DISCHARGE:                              CARDIAC CATHETERIZATION   INDICATIONS FOR PROCEDURE:  Susan [name] is 77 year old female with a known  history of moderate coronary artery disease.  She was admitted to the  hospital yesterday after having a presyncopal spell after playing tennis for  many hours.  She ruled out for myocardial infarction.  She is brought to the  catheterization lab because of some EKG changes.   PROCEDURE:  Left-heart catheterization with coronary angiography.   DESCRIPTION OF PROCEDURE:  The right femoral artery was easily cannulated  using a modified Seldinger technique.   HEMODYNAMICS:  The LV pressure is 149/13 with an aortic pressure of 149/85.   ANGIOGRAPHY:  1. The left main coronary artery is fairly smooth and normal.  2. The left anterior descending artery is diffusely diseased.  The midvessel     has a very long, 60% stenosis.  This stenosis is fairly irregular.  The     remainder of the LAD has moderate luminal irregularities.  3. The left circumflex artery is a moderate size vessel and is fairly     normal.  4. The right coronary artery is large and dominant. There is a proximal 20-     30% stenosis followed by a mid-80% stenosis.  There are minor luminal     irregularities and then there is a 60% stenosis in the distal right     coronary artery.  The posterior descending artery has a 20-30% stenosis     at its origin.  The posterolateral segment artery has moderate luminal     irregularities.   LEFT VENTRICULOGRAM:  A left ventriculogram was performed in a 30 RAO  position. It reveals overall normal left ventricular  systolic function. The  anterior apical wall may be slightly hypokinetic.   PTCA:  The right coronary artery was engaged using the Judkins right 4, 6  [place] guide. The right coronary artery was wired using a BMW guidewire. A  3.0 x 15 mm Quantum Mavik was positioned across the stenosis.  It was  inflated up to 16 atmospheres for 36 seconds followed by 16 atmospheres for  22 seconds.  This was done with improvement of the vessel lumen.  Following  this it was moved down to the distal aspect of the vessel and was inflated  up to 8 atmospheres for 20 seconds.  At this point a 2.75 x 16 mm Taxus  stent was placed across the mid-stenosis.  It was deployed at 18 atmospheres  for 43 seconds and then a follow up inflation  of 15 atmospheres for 25  seconds was performed.  This balloon was removed and a 2.75 x 32 mm Taxus  stent was positioned across the distal stenosis. It was deployed at 14  atmospheres for 29 seconds; a second inflation of 10 atmospheres for 12  seconds was performed to help loosen the balloon from the stent. At this  point, the 3.0 x 15 mm Quantum Mavik was reinserted for postdilatation. We  positioned the balloon in the distal stent and performed 3 separate  inflations at 12 atmospheres each.  The Quantum Mavik was then pulled back  to the midlesion where it was inflated to 15 atmospheres for 25 seconds.  This resulted in a marked improvement of the vessel lumen with brisk flow.   COMPLICATIONS:  None.   CONCLUSIONS:  1. Modecate disease of the mid-LAD. This lesion may need a percutaneous     transluminal coronary angioplasty and stent later on.  We will perform a     stress Cardiolite study in the near future for further evaluation.  This     lesion does not look as straightforward as the right coronary lesions,     and may be best treated medically.  We will perform a stress test before     proceeding with angioplasty of this unless she has further symptoms, at     that  point the mid-LAD.  2. Successful percutaneous transluminal coronary angioplasty and stenting of     the mid-RCA and distal RCA.  3. She has normal left ventricular systolic function.                                               Vesta Mixer, M.D.    PJN/MEDQ  D:  11/14/2003  T:  11/14/2003  Job:  [id number]

## 2010-09-19 NOTE — Discharge Summary (Signed)
NAME:  Susan [name], Susan [name]                        ACCOUNT NO.:  [id number]   MEDICAL RECORD NO.:  [id number]                   PATIENT TYPE:  INP   LOCATION:  6522                                 FACILITY:  [facility]   PHYSICIAN:  Vesta Mixer, M.D.              DATE OF BIRTH:  1933/08/22   DATE OF ADMISSION:  11/13/2003  DATE OF DISCHARGE:  11/15/2003                                 DISCHARGE SUMMARY   DISCHARGE DIAGNOSES:  1.  Coronary artery disease.  She is status post percutaneous transluminal      coronary angioplasty and stenting of the mid right coronary artery and      the distal right coronary artery.  2.  Hypothyroidism.  3.  Hyperlipidemia.   DISCHARGE MEDICATIONS:  1.  Aspirin 325 mg a day.  2.  Plavix 75 mg a day.  3.  Zocor 40 mg a day.  4.  Synthroid 0.05 mg a day.  5.  Nitroglycerin as needed.   DISPOSITION:  The patient will see Dr. Elease Hashimoto in one to two weeks.   FOLLOWUP:  She will follow up with Dr. Rodrigo Ran in several weeks.   HISTORY:  [name] is a 77 year old female with a history of moderate  coronary artery disease by heart catheterization several years ago.  She  presented through the emergency room with episodes of weakness and vague  chest pain.   HOSPITAL COURSE:  Problem #1.  Coronary artery disease.  The patient  underwent heart catheterization on November 14, 2003.  She was found to have  moderate disease involving the mid left anterior descending artery between  50 and 60%.  This vessel was relatively small size, and the lesion was  approximately 35 mm in length.  It was not favorable for stenting although  stenting could be considered in the future.  She also had mid 80% stenosis,  and a distal 60-70% stenosis.  She underwent PTCA and stenting of the mid  and distal lesions.  She tolerated the procedure quite well.  She did not have any further episodes of chest pain or shortness of breath.  She will be followed up by Dr. Elease Hashimoto in the next  several weeks.   All of her other medical problems are stable, and will be followed by Dr.  Waynard Edwards.                                                Vesta Mixer, M.D.    PJN/MEDQ  D:  01/11/2004  T:  01/12/2004  Job:  [id number]   cc:   Loraine Leriche A. Waynard Edwards, M.D.  [address]  [place]  [place] [address]  Fax: [phone]

## 2010-09-19 NOTE — H&P (Signed)
NAME:  Susan [name], BLOODSWORTH                        ACCOUNT NO.:  [id number]   MEDICAL RECORD NO.:  [id number]                   PATIENT TYPE:  INP   LOCATION:  1824                                 FACILITY:  [facility]   PHYSICIAN:  Vesta Mixer, M.D.              DATE OF BIRTH:  1934/02/18   DATE OF ADMISSION:  11/13/2003  DATE OF DISCHARGE:                                HISTORY & PHYSICAL   Susan [name] is a 77 year old female with a history of moderate coronary artery  disease.  She is admitted for evaluation after having an episode of weakness  and presyncope.   The patient has a history of moderate coronary artery disease.  She had a  heart catheterization back in 2000 which revealed moderate coronary disease.  She was treated medically and did very well.  She is quite active and plays  tennis several times a week.  Today, she was playing tennis in the hot sun.  It was approximately 92 degrees outside with very high humidity.  She tried  to keep herself hydrated with water, but after she had been playing for an  hour or so, she became very weak and presyncopal.  She was brought to the  emergency room  for further evaluation.  Here, she was found to have some  EKG changes and is now admitted for further evaluation.   She denies any previous problems.  She plays tennis several times a week on  a competitive basis and has not had any problems.   CURRENT MEDICATIONS:  Synthroid once a day, Lescol once a day.   ALLERGIES:  She has no allergies.   PAST MEDICAL HISTORY:  1. Moderate coronary artery disease.  2. Hypothyroidism.  3. Hypercholesterolemia.   SOCIAL HISTORY:  The patient is very active.  She plays tennis  competitively.  She does not smoke.   REVIEW OF SYMPTOMS:  Essentially negative.   FAMILY HISTORY:  Unremarkable.   PHYSICAL EXAMINATION:  GENERAL:  She is an elderly female in no acute distress.  She is alert and  oriented x 3.  Her mood and affect are normal.  VITAL SIGNS:  Heart rate 80, blood pressure 130/75.  NECK:  No JVD, no thyromegaly.  LUNGS:  Clear to auscultation.  HEART:  Regular rate, S1 and S2.  She has no murmurs, gallops, or rubs.  ABDOMEN:  Good bowel sounds, nontender.  EXTREMITIES:  No clubbing, cyanosis or edema.  NEUROLOGICAL:  Nonfocal.  Her pulses are intact.   LABORATORY DATA:  Her initial set of CPK and MB are negative.  Her EKG  reveals normal sinus rhythm.  She has poor R wave progression anteriorly  consistent with a previous anterior septal myocardial infarction.  She has  slight widening of her QRS duration.   IMPRESSION:  Susan [name] presents with an episode of vague chest pain, some  weakness, and presyncope.  Her EKG suggests that  she might have had an  anterior wall MI in the past.  Given her history and these new symptoms and  EKG changes, I would favor heart catheterization tomorrow.  We will place  her on heparin tonight.  She appears to be quite comfortable and I do not  think that we need to do the dual heart cath tonight.  We have discussed the  risks, benefits, and options of heart catheterization.  She understands and  agrees to proceed.                                                Vesta Mixer, M.D.    PJN/MEDQ  D:  11/13/2003  T:  11/13/2003  Job:  [id number]   cc:   Loraine Leriche A. Waynard Edwards, M.D.  [address]  [place]  [place] [address]  Fax: [phone]

## 2010-09-19 NOTE — Procedures (Signed)
[facility]  Patient:    Susan [name], Susan [name] Visit Number: [id number] MRN: [id number]          Service Type: END Location: ENDO Attending Physician:  Louie Bun Dictated by:   Everardo All Madilyn Fireman, M.D. Proc. Date: 05/16/01 Admit Date:  05/16/2001   CC:         Rodrigo Ran, M.D.   Procedure Report  PROCEDURE:  Colonoscopy.  INDICATION FOR PROCEDURE:  Screening colonoscopy in a 77 year old patient with no previous colon screening.  DESCRIPTION OF PROCEDURE:  The patient was placed in the left lateral decubitus position and placed on the pulse monitor with continuous low-flow oxygen delivered by nasal cannula.  She was sedated with 70 mg of IV Demerol and 7.5 mg of IV Versed.  The Olympus video colonoscope was inserted into the rectum and advanced to the cecum, confirmed by transillumination at McBurneys point and visualization of the ileocecal valve and appendiceal orifice.  The prep was suboptimal in the right colon, particularly the cecum, and I could not rule out small lesions less than 1 cm in all areas in the cecum, and there were some limitations to visibility in the ascending colon as well. Otherwise, the cecum, ascending, transverse, descending, and sigmoid colon all appeared normal with no masses, polyps, diverticula or other mucosal abnormalities.  The rectum likewise appeared normal, and retroflex view of the anus revealed no obvious internal hemorrhoids.  The colonoscope was then withdrawn, and the patient returned to the recovery room in stable condition. She tolerated the procedure well, and there were no immediate complications.  IMPRESSION:  Essentially normal screening colonoscopy. Dictated by:   Everardo All Madilyn Fireman, M.D. Attending Physician:  Louie Bun DD:  05/16/01 TD:  05/16/01 Job: [id number]

## 2011-01-30 LAB — COMPREHENSIVE METABOLIC PANEL
Alkaline Phosphatase: 52
BUN: 15
Calcium: 8.5
Creatinine, Ser: 0.66
Glucose, Bld: 113 — ABNORMAL HIGH
Total Protein: 6.2

## 2011-01-30 LAB — URINALYSIS, ROUTINE W REFLEX MICROSCOPIC
Bilirubin Urine: NEGATIVE
Ketones, ur: NEGATIVE
Nitrite: NEGATIVE
pH: 7.5

## 2011-01-30 LAB — CARDIAC PANEL(CRET KIN+CKTOT+MB+TROPI)
CK, MB: 2.5
Relative Index: INVALID
Total CK: 99

## 2011-01-30 LAB — CK TOTAL AND CKMB (NOT AT ARMC)
CK, MB: 2.8
Total CK: 100

## 2011-01-30 LAB — CBC
MCHC: 32.9
RDW: 14.1
WBC: 7.6

## 2011-01-30 LAB — TSH: TSH: 2.715

## 2011-01-30 LAB — PROTIME-INR
INR: 1
Prothrombin Time: 13.6

## 2011-01-30 LAB — TROPONIN I: Troponin I: 0.01

## 2011-01-30 LAB — POCT HEMOGLOBIN-HEMACUE: Hemoglobin: 13.4

## 2011-03-20 ENCOUNTER — Other Ambulatory Visit: Payer: Self-pay | Admitting: Cardiovascular Disease

## 2011-05-06 ENCOUNTER — Other Ambulatory Visit: Payer: Self-pay | Admitting: Cardiovascular Disease

## 2011-05-06 NOTE — Telephone Encounter (Signed)
Pt needs appointment then refill can be made Fax Received. Refill Completed. Susan [name] (R.M.A)   

## 2011-05-11 ENCOUNTER — Other Ambulatory Visit: Payer: Self-pay | Admitting: *Deleted

## 2011-05-11 ENCOUNTER — Other Ambulatory Visit: Payer: Self-pay | Admitting: Cardiovascular Disease

## 2011-05-11 MED ORDER — CLOPIDOGREL BISULFATE 75 MG PO TABS: 75.0000 mg | ORAL_TABLET | Freq: Every day | ORAL | Status: DC

## 2011-05-11 MED ORDER — ROSUVASTATIN CALCIUM 20 MG PO TABS: 20.0000 mg | ORAL_TABLET | Freq: Every day | ORAL | Status: DC

## 2011-05-11 NOTE — Telephone Encounter (Signed)
Called pt msg left, rx was sent,

## 2011-05-11 NOTE — Telephone Encounter (Signed)
Refill completed.

## 2011-05-11 NOTE — Telephone Encounter (Signed)
New msg  Pt wants generic rx for plavix and refill crestor . She is going out of town and needs refill before appt in feb She uses [facility] on [address]

## 2011-05-12 ENCOUNTER — Other Ambulatory Visit (HOSPITAL_COMMUNITY): Payer: Self-pay | Admitting: *Deleted

## 2011-05-13 ENCOUNTER — Other Ambulatory Visit (HOSPITAL_COMMUNITY): Payer: Self-pay

## 2011-05-15 ENCOUNTER — Encounter (HOSPITAL_COMMUNITY): Payer: Self-pay

## 2011-05-15 ENCOUNTER — Encounter (HOSPITAL_COMMUNITY)
Admission: RE | Admit: 2011-05-15 | Discharge: 2011-05-15 | Disposition: A | Discharge status: Home / Self Care | Payer: Medicare Other | Source: Ambulatory Visit | Attending: Internal Medicine | Admitting: Internal Medicine

## 2011-05-15 DIAGNOSIS — M810 Age-related osteoporosis without current pathological fracture: Secondary | ICD-10-CM | POA: Insufficient documentation

## 2011-05-15 MED ORDER — ZOLEDRONIC ACID 5 MG/100ML IV SOLN
5.0000 mg | Freq: Once | INTRAVENOUS | Status: AC
  Administered 2011-05-15: 5 mg via INTRAVENOUS
  Filled 2011-05-15: qty 100

## 2011-05-15 MED ORDER — SODIUM CHLORIDE 0.9 % IV SOLN
INTRAVENOUS | Status: AC
  Administered 2011-05-15: 11:00:00 via INTRAVENOUS

## 2011-05-20 ENCOUNTER — Encounter (HOSPITAL_COMMUNITY): Payer: Medicare Other

## 2011-06-15 ENCOUNTER — Telehealth: Payer: Self-pay | Admitting: Cardiovascular Disease

## 2011-06-15 NOTE — Telephone Encounter (Signed)
SAMPLES PROVIDED

## 2011-06-15 NOTE — Telephone Encounter (Signed)
New Msg: pt calling wanting to know if we have any crestor 20 mg samples. Please return pt call to discuss further.    Pt pharmacy refuses to refill RX until pt sees Md. Pt has appt to see Dr. Elease Hashimoto 06/22/11.

## 2011-06-22 ENCOUNTER — Encounter: Payer: Self-pay | Admitting: Cardiovascular Disease

## 2011-06-22 ENCOUNTER — Ambulatory Visit (INDEPENDENT_AMBULATORY_CARE_PROVIDER_SITE_OTHER): Payer: Medicare Other | Admitting: Cardiovascular Disease

## 2011-06-22 VITALS — BP 137/87 | HR 83 | Ht 63.5 in | Wt 132.0 lb

## 2011-06-22 DIAGNOSIS — I2510 Atherosclerotic heart disease of native coronary artery without angina pectoris: Secondary | ICD-10-CM

## 2011-06-22 MED ORDER — ROSUVASTATIN CALCIUM 20 MG PO TABS: 20.0000 mg | ORAL_TABLET | Freq: Every day | ORAL | Status: DC

## 2011-06-22 MED ORDER — NITROGLYCERIN 0.4 MG SL SUBL: 0.4000 mg | SUBLINGUAL_TABLET | SUBLINGUAL | Status: AC | PRN

## 2011-06-22 NOTE — Assessment & Plan Note (Signed)
She is doing well.  Her BP is a bit elevated today - most likely due to eating [place] food yesterday.  Her BP is typically OK. She has not had any angina. We will consider getting a stress myoview after her next visit.

## 2011-06-22 NOTE — Progress Notes (Signed)
Susan [name] Date of Birth  1933-06-05 [facility]      Office  [address]    [address]   [address] [place], [place]  [address]    [place], [place]  [address] [phone]  Fax  [phone]  [phone]  Fax [phone]  Problem list: 1. Coronary artery disease-status post PTCA and stenting of her right coronary artery. We placed a 2.75 x 16 mm Taxus stent deployed at 18 atmospheres. She also has a moderate proximal LAD stenosis. 2. Hypothyroidism  History of Present Illness:   Susan [name] is a 78 yo with a hx of CAD.  She is still playing tennis regularly.  She has not had any angina.  She has some DOE.    Current Outpatient Prescriptions on File Prior to Visit  Medication Sig Dispense Refill  . aspirin 81 MG tablet Take 81 mg by mouth daily.        . Calcium Carbonate-Vitamin D (CALCIUM + D PO) Take by mouth 2 (two) times daily.        . clopidogrel (PLAVIX) 75 MG tablet Take 1 tablet (75 mg total) by mouth daily.  30 tablet  1  . fish oil-omega-3 fatty acids 1000 MG capsule Take by mouth daily.        Marland Kitchen levothyroxine (SYNTHROID, LEVOTHROID) 50 MCG tablet Take 50 mcg by mouth daily.        . Multiple Vitamins-Minerals (PRESERVISION/LUTEIN PO) Take by mouth daily.       . nitroGLYCERIN (NITROSTAT) 0.4 MG SL tablet Place 0.4 mg under the tongue every 5 (five) minutes as needed.        . rosuvastatin (CRESTOR) 20 MG tablet Take 1 tablet (20 mg total) by mouth daily.  30 tablet  1  . venlafaxine (EFFEXOR-XR) 150 MG 24 hr capsule Take 150 mg by mouth daily.        . vitamin B-12 (CYANOCOBALAMIN) 1000 MCG tablet Take 1,000 mcg by mouth daily.        . Zoledronic Acid (RECLAST IV) Inject into the vein daily. YEARLY         No Known Allergies  Past Medical History  Diagnosis Date  . Coronary artery disease     POST PTCA AND STENTING OF HER RIGHT CORONARY ARTERY  . LAD stenosis     MODERATE 50-60% STENOSIS  . Hypothyroidism   . Osteoporosis      Past Surgical History  Procedure Date  . Knee surgery     arthroscopic  torn meniscus  . Lumbar laminectomy     years  ago  . Ovarian cyst rupture and appy   . Fractured wrist repair     History  Smoking status  . Never Smoker   Smokeless tobacco  . Not on file    History  Alcohol Use No    Family History  Problem Relation Age of Onset  . Alzheimer's disease Mother   . Heart attack Father   . Aneurysm Sister     Reviw of Systems:  Reviewed in the HPI.  All other systems are negative.  Physical Exam: Blood pressure 137/87, pulse 83, height 5' 3.5" (1.613 m), weight 132 lb (59.875 kg). General: Well developed, well nourished, in no acute distress.  Head: Normocephalic, atraumatic, sclera non-icteric, mucus membranes are moist,   Neck: Supple. Negative for carotid bruits. JVD not elevated.  Lungs: Clear bilaterally to auscultation without wheezes, rales, or rhonchi. Breathing is unlabored.  Heart:  RRR with S1 S2. No murmurs, rubs, or gallops appreciated.  Abdomen: Soft, non-tender, non-distended with normoactive bowel sounds. No hepatomegaly. No rebound/guarding. No obvious abdominal masses.  Msk:  Strength and tone appear normal for age.  Extremities: No clubbing or cyanosis. No edema.  Distal pedal pulses are 2+ and equal bilaterally.  Neuro: Alert and oriented X 3. Moves all extremities spontaneously.  Psych:  Responds to questions appropriately with a normal affect.  ECG:  Assessment / Plan:

## 2011-06-22 NOTE — Patient Instructions (Signed)
Your physician wants you to follow-up in: 6 months.   You will receive a reminder letter in the mail two months in advance. If you don't receive a letter, please call our office to schedule the follow-up appointment.  Your physician recommends that you return for lab work in: 6 months at your next appt (lipid, liver and bmet)

## 2011-07-13 ENCOUNTER — Emergency Department (HOSPITAL_COMMUNITY): Payer: Medicare Other

## 2011-07-13 ENCOUNTER — Other Ambulatory Visit: Payer: Self-pay

## 2011-07-13 ENCOUNTER — Inpatient Hospital Stay (HOSPITAL_COMMUNITY)
Admission: EM | Admit: 2011-07-13 | Discharge: 2011-07-15 | DRG: 645 | Disposition: A | Discharge status: Home / Self Care | Payer: Medicare Other | Attending: Internal Medicine | Admitting: Internal Medicine

## 2011-07-13 ENCOUNTER — Encounter (HOSPITAL_COMMUNITY): Payer: Self-pay | Admitting: Emergency Medicine

## 2011-07-13 DIAGNOSIS — G459 Transient cerebral ischemic attack, unspecified: Secondary | ICD-10-CM | POA: Diagnosis present

## 2011-07-13 DIAGNOSIS — E860 Dehydration: Secondary | ICD-10-CM | POA: Diagnosis present

## 2011-07-13 DIAGNOSIS — R55 Syncope and collapse: Secondary | ICD-10-CM | POA: Diagnosis present

## 2011-07-13 DIAGNOSIS — E039 Hypothyroidism, unspecified: Secondary | ICD-10-CM | POA: Diagnosis present

## 2011-07-13 DIAGNOSIS — D352 Benign neoplasm of pituitary gland: Principal | ICD-10-CM | POA: Diagnosis present

## 2011-07-13 DIAGNOSIS — I2510 Atherosclerotic heart disease of native coronary artery without angina pectoris: Secondary | ICD-10-CM | POA: Diagnosis present

## 2011-07-13 DIAGNOSIS — R479 Unspecified speech disturbances: Secondary | ICD-10-CM | POA: Insufficient documentation

## 2011-07-13 DIAGNOSIS — Z9861 Coronary angioplasty status: Secondary | ICD-10-CM

## 2011-07-13 DIAGNOSIS — M810 Age-related osteoporosis without current pathological fracture: Secondary | ICD-10-CM | POA: Diagnosis present

## 2011-07-13 LAB — CBC
Hemoglobin: 11.1 g/dL — ABNORMAL LOW (ref 12.0–15.0)
MCH: 30.7 pg (ref 26.0–34.0)
RBC: 3.61 MIL/uL — ABNORMAL LOW (ref 3.87–5.11)

## 2011-07-13 LAB — DIFFERENTIAL
Lymphocytes Relative: 20 % (ref 12–46)
Monocytes Absolute: 0.7 10*3/uL (ref 0.1–1.0)
Monocytes Relative: 8 % (ref 3–12)
Neutro Abs: 6.1 10*3/uL (ref 1.7–7.7)

## 2011-07-13 LAB — URINE MICROSCOPIC-ADD ON

## 2011-07-13 LAB — POCT I-STAT, CHEM 8
BUN: 10 mg/dL (ref 6–23)
HCT: 35 % — ABNORMAL LOW (ref 36.0–46.0)
Sodium: 142 mEq/L (ref 135–145)
TCO2: 22 mmol/L (ref 0–100)

## 2011-07-13 LAB — URINALYSIS, ROUTINE W REFLEX MICROSCOPIC
Glucose, UA: NEGATIVE mg/dL
Hgb urine dipstick: NEGATIVE
Ketones, ur: NEGATIVE mg/dL
Protein, ur: NEGATIVE mg/dL

## 2011-07-13 LAB — PROTIME-INR: INR: 1.08 (ref 0.00–1.49)

## 2011-07-13 LAB — COMPREHENSIVE METABOLIC PANEL
BUN: 12 mg/dL (ref 6–23)
CO2: 19 mEq/L (ref 19–32)
Chloride: 104 mEq/L (ref 96–112)
Creatinine, Ser: 0.84 mg/dL (ref 0.50–1.10)
GFR calc non Af Amer: 66 mL/min — ABNORMAL LOW (ref >90)
Total Bilirubin: 0.3 mg/dL (ref 0.3–1.2)

## 2011-07-13 LAB — GLUCOSE, CAPILLARY: Glucose-Capillary: 135 mg/dL — ABNORMAL HIGH (ref 70–99)

## 2011-07-13 LAB — CK TOTAL AND CKMB (NOT AT ARMC)
CK, MB: 3.2 ng/mL (ref 0.3–4.0)
Total CK: 89 U/L (ref 7–177)

## 2011-07-13 MED ORDER — SODIUM CHLORIDE 0.9 % IV SOLN
INTRAVENOUS | Status: DC
  Administered 2011-07-14: 02:00:00 via INTRAVENOUS

## 2011-07-13 MED ORDER — ONDANSETRON HCL 4 MG/2ML IJ SOLN
INTRAMUSCULAR | Status: AC
  Filled 2011-07-13: qty 2

## 2011-07-13 MED ORDER — ASPIRIN 81 MG PO CHEW
324.0000 mg | CHEWABLE_TABLET | Freq: Once | ORAL | Status: AC
  Administered 2011-07-13: 324 mg via ORAL
  Filled 2011-07-13: qty 4

## 2011-07-13 NOTE — ED Notes (Signed)
To CT with pt.

## 2011-07-13 NOTE — ED Notes (Signed)
Pt arrived from [facility] ED for a Code Stroke.  MD at bedside.  Neurology to meet pt in CT.  Pt with L sided facial droop.  Confused and stuttering.

## 2011-07-13 NOTE — ED Notes (Signed)
Unable to obtain blood prior to pt transport.

## 2011-07-13 NOTE — H&P (Signed)
PCP:   Ezequiel Kayser, MD, MD  Card[facility]  Chief Complaint:   presyncope  HPI: Susan [name] is a 78 y.o. female   has a past medical history of Coronary artery disease; LAD stenosis; Hypothyroidism; and Osteoporosis.   Presented with  Feeling like she is about to pass out. Today she mowed her yard, had only couple of crackers and apple for lunch and imidiatly went to church and donated blood almost immediately fell weak and felt like she about to pass out. No chest pain no numbness no weakness. Her blood pressure went down to 60's. She was brought to [facility]. While at [facility] she developed trouble answering questions and code stroke was called her symptoms rapidly improved. She remembers the event states she could not understand questions that are asked of her.  NO TPA was given patient is being admitted to medicine.   Review of Systems:    Pertinent positives include: chills, trouble verbalizing  Constitutional:  No weight loss, night sweats, Fevers, chills, fatigue, weight loss  HEENT:  No headaches, Difficulty swallowing,Tooth/dental problems,Sore throat,  No sneezing, itching, ear ache, nasal congestion, post nasal drip,  Cardio-vascular:  No chest pain, Orthopnea, PND, anasarca, dizziness, palpitations.no Bilateral lower extremity swelling  GI:  No heartburn, indigestion, abdominal pain, nausea, vomiting, diarrhea, change in bowel habits, loss of appetite, melena, blood in stool, hematoemesis Resp:  no shortness of breath at rest. No dyspnea on exertion, No excess mucus, no productive cough, No non-productive cough, No coughing up of blood.No change in color of mucus.No wheezing. Skin:  no rash or lesions. No jaundice GU:  no dysuria, change in color of urine, no urgency or frequency. No straining to urinate.  No flank pain.  Musculoskeletal:  No joint pain or no joint swelling. No decreased range of motion. No back pain.  Psych:  No change in mood or affect. No  depression or anxiety. No memory loss.  Neuro: no localizing neurological complaints, no tingling, no weakness, no double vision, no gait abnormality, no slurred speech, no confusion  Otherwise ROS are negative except for above, 10 systems were reviewed  Past Medical History: Past Medical History  Diagnosis Date  . Coronary artery disease     POST PTCA AND STENTING OF HER RIGHT CORONARY ARTERY  . LAD stenosis     MODERATE 50-60% STENOSIS  . Hypothyroidism   . Osteoporosis    Past Surgical History  Procedure Date  . Knee surgery     arthroscopic  torn meniscus  . Lumbar laminectomy     years  ago  . Ovarian cyst rupture and appy   . Fractured wrist repair      Medications: Prior to Admission medications   Medication Sig Start Date End Date Taking? Authorizing Provider  aspirin 81 MG tablet Take 81 mg by mouth daily.      Historical Provider, MD  Calcium Carbonate-Vitamin D (CALCIUM + D PO) Take by mouth 2 (two) times daily.      Historical Provider, MD  clopidogrel (PLAVIX) 75 MG tablet Take 1 tablet (75 mg total) by mouth daily. 05/11/11   Vesta Mixer, MD  fish oil-omega-3 fatty acids 1000 MG capsule Take by mouth daily.      Historical Provider, MD  levothyroxine (SYNTHROID, LEVOTHROID) 50 MCG tablet Take 50 mcg by mouth daily.      Historical Provider, MD  Multiple Vitamins-Minerals (PRESERVISION/LUTEIN PO) Take by mouth daily.     Historical Provider, MD  nitroGLYCERIN (  NITROSTAT) 0.4 MG SL tablet Place 1 tablet (0.4 mg total) under the tongue every 5 (five) minutes as needed. 06/22/11   Vesta Mixer, MD  rosuvastatin (CRESTOR) 20 MG tablet Take 1 tablet (20 mg total) by mouth daily. 06/22/11   Vesta Mixer, MD  venlafaxine (EFFEXOR-XR) 150 MG 24 hr capsule Take 150 mg by mouth daily.      Historical Provider, MD  vitamin B-12 (CYANOCOBALAMIN) 1000 MCG tablet Take 1,000 mcg by mouth daily.      Historical Provider, MD  Zoledronic Acid (RECLAST IV) Inject into the vein  daily. YEARLY     Historical Provider, MD    Allergies:  No Known Allergies  Social History:  Ambulatory independently very active Lives at home alone, she is a widow   reports that she has never smoked. She does not have any smokeless tobacco history on file. She reports that she does not drink alcohol or use illicit drugs.   Family History: family history includes Alzheimer's disease in her mother; Aneurysm in her sister; and Heart attack in her father.    Physical Exam: Patient Vitals for the past 24 hrs:  BP Temp Temp src Pulse Resp SpO2 Height Weight  07/14/11 0505 104/63 mmHg 97.9 F (36.6 C) Oral 73  17  93 % - -  07/14/11 0300 97/61 mmHg 98.1 F (36.7 C) Oral 79  16  95 % - -  07/14/11 0100 95/60 mmHg 98.2 F (36.8 C) Oral 77  18  94 % - -  07/13/11 2306 119/78 mmHg 98 F (36.7 C) Oral 72  16  93 % 5\' 3"  (1.6 m) 60.3 kg (132 lb 15 oz)  07/13/11 2151 136/69 mmHg - - 88  18  100 % - -  07/13/11 2100 - 98.6 F (37 C) - - - - - -  07/13/11 2021 125/67 mmHg - - 88  16  100 % - -  07/13/11 2015 125/67 mmHg - - 84  13  99 % - -  07/13/11 2000 120/65 mmHg - - 85  17  100 % - -  07/13/11 1933 - 97.7 F (36.5 C) Oral - - - - -  07/13/11 1929 126/72 mmHg - - 83  16  100 % - -  07/13/11 1844 152/74 mmHg 97.5 F (36.4 C) Oral 88  25  100 % - -  07/13/11 1757 143/52 mmHg 97.5 F (36.4 C) Oral 72  - 100 % - -    1. General:  in No Acute distress 2. Psychological: Alert and Oriented 3. Head/ENT:    Dry Mucous Membranes                          Head Non traumatic, neck supple                          Normal Dentition 4. SKIN:  decreased Skin turgor,  Skin clean Dry and intact no rash 5. Heart: Regular rate and rhythm no Murmur, Rub or gallop, heart rate increases as patient sits up 6. Lungs: Clear to auscultation bilaterally, no wheezes or crackles   7. Abdomen: Soft, non-tender, Non distended 8. Lower extremities: no clubbing, cyanosis, or edema 9. Neurologically strength  5 out of 5 in all 4 extremities cranial 2 through 12 intact 10. MSK: Normal range of motion  body mass index is 23.55 kg/(m^2).   Labs on Admission:  Basename 07/13/11 2121 07/13/11 1930  NA 142 136  K 3.7 3.5  CL 108 104  CO2 -- 19  GLUCOSE 111* 128*  BUN 10 12  CREATININE 1.00 0.84  CALCIUM -- 8.8  MG -- --  PHOS -- --    Basename 07/13/11 1930  AST 19  ALT 13  ALKPHOS 56  BILITOT 0.3  PROT 6.9  ALBUMIN 3.6   No results found for this basename: LIPASE:2,AMYLASE:2 in the last 72 hours  Basename 07/13/11 2121 07/13/11 1930  WBC -- 8.5  NEUTROABS -- 6.1  HGB 11.9* 11.1*  HCT 35.0* 33.2*  MCV -- 92.0  PLT -- 190    Basename 07/13/11 1930  CKTOTAL 89  CKMB 3.2  CKMBINDEX --  TROPONINI <0.30   No results found for this basename: TSH,T4TOTAL,FREET3,T3FREE,THYROIDAB in the last 72 hours No results found for this basename: VITAMINB12:2,FOLATE:2,FERRITIN:2,TIBC:2,IRON:2,RETICCTPCT:2 in the last 72 hours No results found for this basename: HGBA1C    Estimated Creatinine Clearance: 40.2 ml/min (by C-G formula based on Cr of 1). ABG    Component Value Date/Time   TCO2 22 07/13/2011 2121     No results found for this basename: DDIMER     Other results:  I have pearsonaly reviewed this: ECG REPORT  Rate:87  Rhythm: NSR ST&T Change: No ischemic changes  UA 3-6 wbc asymptomatic   Cultures: No results found for this basename: sdes, specrequest, cult, reptstatus       Radiological Exams on Admission: Ct Head Wo Contrast  07/13/2011  *RADIOLOGY REPORT*  Clinical Data: Left facial droop  CT HEAD WITHOUT CONTRAST  Technique:  Contiguous axial images were obtained from the base of the skull through the vertex without contrast.  Comparison: None.  Findings: Chronic ischemic changes.  Mild global atrophy. Hypodensity in the anterior limb of the right internal capsule is slightly more pronounced.  No mass effect, midline shift, or acute intracranial hemorrhage.  Dense mucosal thickening in the right maxillary sinus is redemonstrated.  There is thickening of the wall of the right maxillary sinus indicating chronic inflammation.  IMPRESSION: Chronic ischemic changes and atrophy are noted.  Hypodensity in the right internal capsule is slightly more pronounced an acute ischemia in this vicinity is not excluded.  Original Report Authenticated By: Donavan Burnet, M.D.    Assessment/Plan  78 yo w possible TIA vs CVA like symptoms in the setting of dehydration and blood donation  Present on Admission:  .Speech abnormality - seems to be at a typical but will workup as a TIA  .TIA (transient ischemic attack) -  - will admit based on TIA/CVA protocol, await results of MRA/MRI, Carotid Doppler and Echo, obtain cardiac enzymes,  ECG, Homocysteine, Lipid panel, TSH. Order PT/OT evaluation. Will make sure patient is on antiplatelet agent. Neurology seen in ED.      Marland KitchenCoronary artery disease - given presyncopal-like state we'll cycle cardiac markers obtained serial EKGs obtain echo gram  .Dehydration - we'll give IV fluids Abnormal UA slightly no symptoms will await urine culture results before treating   Prophylaxis: SCD Protonix  CODE STATUS: Full Code   Zaniya Mcaulay 07/13/2011, 11:28 PM

## 2011-07-13 NOTE — ED Notes (Signed)
Dr Thad Ranger assessing pt.  Pt speaking in complete sentences.

## 2011-07-13 NOTE — ED Notes (Signed)
Per EMS, pt gave blood and passed out afterwards. Pt reports dizziness at the time but denies n/v/dizziness now.

## 2011-07-13 NOTE — ED Notes (Signed)
Pt has bed.  Internal medicine paged.

## 2011-07-13 NOTE — ED Notes (Signed)
New order for Code Stroke, [facility] here to transport patient at time of order. Emtala, notes and face sheet sent with [facility]. Charge nurse aware.

## 2011-07-13 NOTE — ED Notes (Signed)
Per EMS, pt received 4mg  zofran enroute

## 2011-07-13 NOTE — ED Provider Notes (Signed)
History     CSN: [id number]  Arrival date & time 07/13/11  1744   First MD Initiated Contact with Patient 07/13/11 1820    5 caveat due to patient's to call the with speech  Chief Complaint  Patient presents with  . Loss of Consciousness    (Consider location/radiation/quality/duration/timing/severity/associated sxs/prior treatment) HPI  Patient Is a 78 year old female whose history is given by her friend. The friend was with her this afternoon when she gave blood. Her friend states that Ms. Underwood stated that she would be better in a minute and place her head between her knees. She then sat up but continued to have a glazed look on her face. She is having speech difficulty since that time. The patient is able to speak to me to me but states that she is having difficulty speaking.  She is unable to remember her health history or her medication list. She normally is very active and plays tennis and was working in her yard this morning.   Past Medical History  Diagnosis Date  . Coronary artery disease     POST PTCA AND STENTING OF HER RIGHT CORONARY ARTERY  . LAD stenosis     MODERATE 50-60% STENOSIS  . Hypothyroidism   . Osteoporosis     Past Surgical History  Procedure Date  . Knee surgery     arthroscopic  torn meniscus  . Lumbar laminectomy     years  ago  . Ovarian cyst rupture and appy   . Fractured wrist repair     Family History  Problem Relation Age of Onset  . Alzheimer's disease Mother   . Heart attack Father   . Aneurysm Sister     History  Substance Use Topics  . Smoking status: Never Smoker   . Smokeless tobacco: Not on file  . Alcohol Use: No    OB History    Grav Para Term Preterm Abortions TAB SAB Ect Mult Living                  Review of Systems  Unable to perform ROS   Allergies  Review of patient's allergies indicates no known allergies.  Home Medications   Current Outpatient Rx  Name Route Sig Dispense Refill  . ASPIRIN 81 MG  PO TABS Oral Take 81 mg by mouth daily.      Marland Kitchen CALCIUM + D PO Oral Take by mouth 2 (two) times daily.      Marland Kitchen CLOPIDOGREL BISULFATE 75 MG PO TABS Oral Take 1 tablet (75 mg total) by mouth daily. 30 tablet 1  . OMEGA-3 FATTY ACIDS 1000 MG PO CAPS Oral Take by mouth daily.      Marland Kitchen LEVOTHYROXINE SODIUM 50 MCG PO TABS Oral Take 50 mcg by mouth daily.      Marland Kitchen PRESERVISION/LUTEIN PO Oral Take by mouth daily.     Marland Kitchen NITROGLYCERIN 0.4 MG SL SUBL Sublingual Place 1 tablet (0.4 mg total) under the tongue every 5 (five) minutes as needed. 25 tablet 3  . ROSUVASTATIN CALCIUM 20 MG PO TABS Oral Take 1 tablet (20 mg total) by mouth daily. 90 tablet 3    Pt needs appointment then refill can be made  . VENLAFAXINE HCL ER 150 MG PO CP24 Oral Take 150 mg by mouth daily.      Marland Kitchen VITAMIN B-12 1000 MCG PO TABS Oral Take 1,000 mcg by mouth daily.      . RECLAST IV Intravenous Inject into the  vein daily. YEARLY       BP 143/52  Pulse 72  Temp(Src) 97.5 F (36.4 C) (Oral)  SpO2 100%  Physical Exam  Nursing note and vitals reviewed. Constitutional: She is oriented to person, place, and time. She appears well-developed and well-nourished.  HENT:  Head: Normocephalic and atraumatic.  Right Ear: External ear normal.  Left Ear: External ear normal.  Mouth/Throat: Oropharynx is clear and moist.  Eyes: Conjunctivae are normal. Pupils are equal, round, and reactive to light.  Neck: Normal range of motion. Neck supple.  Cardiovascular: Normal rate and regular rhythm.   Pulmonary/Chest: Effort normal and breath sounds normal.  Abdominal: Soft.  Musculoskeletal: Normal range of motion.  Neurological: She is alert and oriented to person, place, and time. She has normal reflexes. She displays normal reflexes.       The patient's speech is hesitant and stuttering. Does not have any lateralized deficits noted.  Skin: Skin is warm and dry.    ED Course  Procedures (including critical care time)   Labs Reviewed  CBC    BASIC METABOLIC PANEL  PROTIME-INR  APTT  CARDIAC PANEL(CRET KIN+CKTOT+MB+TROPI)   No results found.   No diagnosis found.    MDM  I called a code stroke upon evaluating the patient at 1840. Dr. Roseanne Reno called back and the patient will be transferred to [facility].      Hilario Quarry, MD 07/13/11 [phone]

## 2011-07-13 NOTE — Consult Note (Signed)
Referring Physician: Ray     Chief Complaint: Dizziness and difficulty with speech  HPI: Susan [name] is an 78 y.o. female who was at [facility] today giving blood.  Did well but afterward became dizzy and felt as if she was going to pass out.  Then became unable to get her words out.  Code stroke was called and patient was transported to [facility] for further evaluation.  Initial NIHSS of 0.  LSN: [id number] tPA Given: No: Resolution of symptoms mRankin: 0  Past Medical History  Diagnosis Date  . Coronary artery disease     POST PTCA AND STENTING OF HER RIGHT CORONARY ARTERY  . LAD stenosis     MODERATE 50-60% STENOSIS  . Hypothyroidism   . Osteoporosis      -  Depression  Past Surgical History  Procedure Date  . Knee surgery     arthroscopic  torn meniscus  . Lumbar laminectomy     years  ago  . Ovarian cyst rupture and appy   . Fractured wrist repair     Family History  Problem Relation Age of Onset  . Alzheimer's disease Mother   . Heart attack Father   . Aneurysm Sister    Social History:  reports that she has never smoked. She does not have any smokeless tobacco history on file. She reports that she does not drink alcohol or use illicit drugs.  Allergies: No Known Allergies  Medications: I have reviewed the patient's current medications. Prior to Admission:  Synthroid, ASA, Plavix, Crestor, Calcium carbonate, Fish Oil, MVI, Nitrostat, Vitamin B-12, Reclast, Effexor XR  ROS: History obtained from the patient  General ROS: negative for - chills, fatigue, fever, night sweats, weight gain or weight loss Psychological ROS: negative for - behavioral disorder, hallucinations, memory difficulties, mood swings or suicidal ideation Ophthalmic ROS: negative for - blurry vision, double vision, eye pain or loss of vision ENT ROS: negative for - epistaxis, nasal discharge, oral lesions, sore throat, tinnitus  Allergy and Immunology ROS: negative for - hives or itchy/watery  eyes Hematological and Lymphatic ROS: negative for - bleeding problems, bruising or swollen lymph nodes Endocrine ROS: negative for - galactorrhea, hair pattern changes, polydipsia/polyuria or temperature intolerance Respiratory ROS: negative for - cough, hemoptysis, shortness of breath or wheezing Cardiovascular ROS: negative for - chest pain, dyspnea on exertion, edema or irregular heartbeat Gastrointestinal ROS: negative for - abdominal pain, diarrhea, hematemesis, nausea/vomiting or stool incontinence Genito-Urinary ROS: negative for - dysuria, hematuria, incontinence or urinary frequency/urgency Musculoskeletal ROS: negative for - joint swelling or muscular weakness Neurological ROS: as noted in HPI Dermatological ROS: negative for rash and skin lesion changes  Physical Examination: Blood pressure 126/72, pulse 83, temperature 97.5 F (36.4 C), temperature source Oral, resp. rate 16, SpO2 100.00%.  Neurologic Examination: Mental Status: Alert, oriented, thought content appropriate.  Speech fluent without evidence of aphasia.  Able to follow 3 step commands without difficulty. Cranial Nerves: II: visual fields grossly normal, pupils equal, round, reactive to light and accommodation III,IV, VI: ptosis not present, extra-ocular motions intact bilaterally V,VII: smile symmetric, facial light touch sensation normal bilaterally VIII: hearing normal bilaterally IX,X: gag reflex present XI: trapezius strength/neck flexion strength normal bilaterally XII: tongue strength normal  Motor: Right : Upper extremity   5/5    Left:     Upper extremity   5/5  Lower extremity   5/5     Lower extremity   5/5 Tone and bulk:normal tone throughout; no atrophy  noted Sensory: Pinprick and light touch intact throughout, bilaterally Deep Tendon Reflexes: 2+ and symmetric throughout Plantars: Right: mute   Left: mute Cerebellar: normal finger-to-nose and normal heel-to-shin test  Results for orders  placed during the hospital encounter of 07/13/11 (from the past 48 hour(s))  GLUCOSE, CAPILLARY     Status: Abnormal   Collection Time   07/13/11  7:25 PM      Component Value Range Comment   Glucose-Capillary 135 (*) 70 - 99 (mg/dL)    Comment 1 Documented in Chart      Comment 2 Notify RN      No results found.  Assessment: 78 y.o. female presenting with dizziness and difficulty with speech.  Symptoms have cleared.  Head CT shows no acute changes.  tPA not given due to resolution of symptoms but can not rule out possibility of TIA with patient's risk factors.  Further work up recommended.  Stroke Risk Factors - hyperlipidemia and CAD  Plan: 1. HgbA1c, fasting lipid panel 2. MRI, MRA  of the brain without contrast 4. Echocardiogram 5. Carotid dopplers 6. Prophylactic therapy-continue ASA and Plavix 7. Risk factor modification 8. Telemetry monitoring  Thana Farr, MD [facility] [phone] 07/13/2011, 7:29 PM

## 2011-07-13 NOTE — ED Provider Notes (Signed)
Dr Thad Ranger has evaluated the patient.  The symptoms have resolved.  She would like the patient worked up for TIA.  Please see dr Ray's note.  Celene Kras, MD 07/13/11 [phone]

## 2011-07-13 NOTE — ED Notes (Signed)
Patient transported to MRI 

## 2011-07-13 NOTE — ED Notes (Signed)
[id number] Ready

## 2011-07-13 NOTE — ED Notes (Signed)
Lab is saying that they do not see orders for labs.

## 2011-07-14 ENCOUNTER — Inpatient Hospital Stay (HOSPITAL_COMMUNITY): Payer: Medicare Other

## 2011-07-14 DIAGNOSIS — R55 Syncope and collapse: Secondary | ICD-10-CM | POA: Diagnosis present

## 2011-07-14 DIAGNOSIS — E860 Dehydration: Secondary | ICD-10-CM | POA: Diagnosis present

## 2011-07-14 DIAGNOSIS — G459 Transient cerebral ischemic attack, unspecified: Secondary | ICD-10-CM | POA: Diagnosis present

## 2011-07-14 DIAGNOSIS — I359 Nonrheumatic aortic valve disorder, unspecified: Secondary | ICD-10-CM

## 2011-07-14 LAB — CARDIAC PANEL(CRET KIN+CKTOT+MB+TROPI)
CK, MB: 2.9 ng/mL (ref 0.3–4.0)
CK, MB: 2.8 ng/mL (ref 0.3–4.0)
CK, MB: 2.9 ng/mL (ref 0.3–4.0)
Relative Index: INVALID (ref 0.0–2.5)
Relative Index: INVALID (ref 0.0–2.5)
Relative Index: INVALID (ref 0.0–2.5)
Total CK: 90 U/L (ref 7–177)
Total CK: 91 U/L (ref 7–177)
Total CK: 90 U/L (ref 7–177)
Troponin I: <0.3 ng/mL (ref <0.30)
Troponin I: <0.3 ng/mL (ref <0.30)
Troponin I: <0.3 ng/mL (ref <0.30)
Troponin I: <0.3 ng/mL (ref <0.30)

## 2011-07-14 LAB — LIPID PANEL
Cholesterol: 132 mg/dL (ref 0–200)
HDL: 47 mg/dL (ref >39)
LDL Cholesterol: 58 mg/dL (ref 0–99)
Total CHOL/HDL Ratio: 2.8 RATIO
Triglycerides: 137 mg/dL (ref <150)
VLDL: 27 mg/dL (ref 0–40)

## 2011-07-14 LAB — GLUCOSE, CAPILLARY
Glucose-Capillary: 73 mg/dL (ref 70–99)
Glucose-Capillary: 77 mg/dL (ref 70–99)

## 2011-07-14 LAB — FOLLICLE STIMULATING HORMONE: FSH: 16.9 m[IU]/mL

## 2011-07-14 LAB — TSH: TSH: 3.771 u[IU]/mL (ref 0.350–4.500)

## 2011-07-14 LAB — HEMOGLOBIN A1C
Hgb A1c MFr Bld: 6 % — ABNORMAL HIGH (ref <5.7)
Mean Plasma Glucose: 126 mg/dL — ABNORMAL HIGH (ref <117)

## 2011-07-14 LAB — LUTEINIZING HORMONE: LH: 8.5 m[IU]/mL

## 2011-07-14 LAB — PROLACTIN: Prolactin: 35.5 ng/mL

## 2011-07-14 MED ORDER — VITAMIN B-12 1000 MCG PO TABS
1000.0000 ug | ORAL_TABLET | Freq: Every day | ORAL | Status: DC
  Administered 2011-07-14 – 2011-07-15 (×2): 1000 ug via ORAL
  Filled 2011-07-14 (×2): qty 1

## 2011-07-14 MED ORDER — CLOPIDOGREL BISULFATE 75 MG PO TABS
75.0000 mg | ORAL_TABLET | Freq: Every day | ORAL | Status: DC
  Administered 2011-07-14 – 2011-07-15 (×2): 75 mg via ORAL
  Filled 2011-07-14 (×2): qty 1

## 2011-07-14 MED ORDER — ATORVASTATIN CALCIUM 40 MG PO TABS
40.0000 mg | ORAL_TABLET | Freq: Every day | ORAL | Status: DC
  Administered 2011-07-14: 40 mg via ORAL
  Filled 2011-07-14 (×2): qty 1

## 2011-07-14 MED ORDER — ASPIRIN 81 MG PO TABS: 81.0000 mg | ORAL_TABLET | Freq: Every day | ORAL | Status: DC

## 2011-07-14 MED ORDER — LEVOTHYROXINE SODIUM 50 MCG PO TABS
50.0000 ug | ORAL_TABLET | Freq: Every day | ORAL | Status: DC
  Administered 2011-07-14 – 2011-07-15 (×2): 50 ug via ORAL
  Filled 2011-07-14 (×4): qty 1

## 2011-07-14 MED ORDER — VENLAFAXINE HCL ER 150 MG PO CP24
150.0000 mg | ORAL_CAPSULE | Freq: Every day | ORAL | Status: DC
  Administered 2011-07-14 – 2011-07-15 (×2): 150 mg via ORAL
  Filled 2011-07-14 (×2): qty 1

## 2011-07-14 MED ORDER — ACETAMINOPHEN 325 MG PO TABS
650.0000 mg | ORAL_TABLET | ORAL | Status: DC | PRN
  Administered 2011-07-14: 650 mg via ORAL

## 2011-07-14 MED ORDER — GADOBENATE DIMEGLUMINE 529 MG/ML IV SOLN
8.0000 mL | Freq: Once | INTRAVENOUS | Status: AC
  Administered 2011-07-14: 8 mL via INTRAVENOUS

## 2011-07-14 MED ORDER — ASPIRIN 81 MG PO CHEW
81.0000 mg | CHEWABLE_TABLET | Freq: Every day | ORAL | Status: DC
  Administered 2011-07-14 – 2011-07-15 (×2): 81 mg via ORAL
  Filled 2011-07-14 (×2): qty 1

## 2011-07-14 MED ORDER — IOHEXOL 350 MG/ML SOLN
50.0000 mL | Freq: Once | INTRAVENOUS | Status: AC | PRN
  Administered 2011-07-14: 50 mL via INTRAVENOUS

## 2011-07-14 NOTE — Consult Note (Signed)
Reason for Consult: Pituitary lesion Referring Physician: Dr. Siri Cole is an 78 y.o. female.  HPI: Asian is a 78 year old right-handed white female who had an episode of near syncope after having donated blood yesterday. Workup included a CT scan of the brain which demonstrated an abnormality in the pituitary region a subsequent MRI demonstrates the presence of a enlargement of the pituitary fossa. Enhanced MRI has not been performed nor have specific cuts through the pituitary fossa been performed. The patient denies any problems with her vision. She notes that her health in general has been good however she felt weak nearly immediately after donating blood yesterday.  Past Medical History  Diagnosis Date  . Coronary artery disease     POST PTCA AND STENTING OF HER RIGHT CORONARY ARTERY  . LAD stenosis     MODERATE 50-60% STENOSIS  . Hypothyroidism   . Osteoporosis     Past Surgical History  Procedure Date  . Knee surgery     arthroscopic  torn meniscus  . Lumbar laminectomy     years  ago  . Ovarian cyst rupture and appy   . Fractured wrist repair     Family History  Problem Relation Age of Onset  . Alzheimer's disease Mother   . Heart attack Father   . Aneurysm Sister     Social History:  reports that she has never smoked. She does not have any smokeless tobacco history on file. She reports that she drinks alcohol. She reports that she does not use illicit drugs.  Allergies: No Known Allergies  Medications: Not reviewed  Results for orders placed during the hospital encounter of 07/13/11 (from the past 48 hour(s))  GLUCOSE, CAPILLARY     Status: Abnormal   Collection Time   07/13/11  7:25 PM      Component Value Range Comment   Glucose-Capillary 135 (*) 70 - 99 (mg/dL)    Comment 1 Documented in Chart      Comment 2 Notify RN     PROTIME-INR     Status: Normal   Collection Time   07/13/11  7:30 PM      Component Value Range Comment   Prothrombin  Time 14.2  11.6 - 15.2 (seconds)    INR 1.08  0.00 - 1.49    APTT     Status: Normal   Collection Time   07/13/11  7:30 PM      Component Value Range Comment   aPTT 26  24 - 37 (seconds)   CBC     Status: Abnormal   Collection Time   07/13/11  7:30 PM      Component Value Range Comment   WBC 8.5  4.0 - 10.5 (K/uL)    RBC 3.61 (*) 3.87 - 5.11 (MIL/uL)    Hemoglobin 11.1 (*) 12.0 - 15.0 (g/dL)    HCT 11.9 (*) 14.7 - 46.0 (%)    MCV 92.0  78.0 - 100.0 (fL)    MCH 30.7  26.0 - 34.0 (pg)    MCHC 33.4  30.0 - 36.0 (g/dL)    RDW 82.9  56.2 - 13.0 (%)    Platelets 190  150 - 400 (K/uL)   DIFFERENTIAL     Status: Normal   Collection Time   07/13/11  7:30 PM      Component Value Range Comment   Neutrophils Relative 72  43 - 77 (%)    Neutro Abs 6.1  1.7 - 7.7 (K/uL)  Lymphocytes Relative 20  12 - 46 (%)    Lymphs Abs 1.7  0.7 - 4.0 (K/uL)    Monocytes Relative 8  3 - 12 (%)    Monocytes Absolute 0.7  0.1 - 1.0 (K/uL)    Eosinophils Relative 1  0 - 5 (%)    Eosinophils Absolute 0.1  0.0 - 0.7 (K/uL)    Basophils Relative 0  0 - 1 (%)    Basophils Absolute 0.0  0.0 - 0.1 (K/uL)   COMPREHENSIVE METABOLIC PANEL     Status: Abnormal   Collection Time   07/13/11  7:30 PM      Component Value Range Comment   Sodium 136  135 - 145 (mEq/L)    Potassium 3.5  3.5 - 5.1 (mEq/L)    Chloride 104  96 - 112 (mEq/L)    CO2 19  19 - 32 (mEq/L)    Glucose, Bld 128 (*) 70 - 99 (mg/dL)    BUN 12  6 - 23 (mg/dL)    Creatinine, Ser 1.61  0.50 - 1.10 (mg/dL)    Calcium 8.8  8.4 - 10.5 (mg/dL)    Total Protein 6.9  6.0 - 8.3 (g/dL)    Albumin 3.6  3.5 - 5.2 (g/dL)    AST 19  0 - 37 (U/L)    ALT 13  0 - 35 (U/L)    Alkaline Phosphatase 56  39 - 117 (U/L)    Total Bilirubin 0.3  0.3 - 1.2 (mg/dL)    GFR calc non Af Amer 66 (*) >90 (mL/min)    GFR calc Af Amer 77 (*) >90 (mL/min)   CK TOTAL AND CKMB     Status: Normal   Collection Time   07/13/11  7:30 PM      Component Value Range Comment   Total  CK 89  7 - 177 (U/L)    CK, MB 3.2  0.3 - 4.0 (ng/mL)    Relative Index RELATIVE INDEX IS INVALID  0.0 - 2.5    TROPONIN I     Status: Normal   Collection Time   07/13/11  7:30 PM      Component Value Range Comment   Troponin I <0.30  <0.30 (ng/mL)   URINALYSIS, ROUTINE W REFLEX MICROSCOPIC     Status: Abnormal   Collection Time   07/13/11  8:36 PM      Component Value Range Comment   Color, Urine YELLOW  YELLOW     APPearance CLOUDY (*) CLEAR     Specific Gravity, Urine 1.006  1.005 - 1.030     pH 7.5  5.0 - 8.0     Glucose, UA NEGATIVE  NEGATIVE (mg/dL)    Hgb urine dipstick NEGATIVE  NEGATIVE     Bilirubin Urine NEGATIVE  NEGATIVE     Ketones, ur NEGATIVE  NEGATIVE (mg/dL)    Protein, ur NEGATIVE  NEGATIVE (mg/dL)    Urobilinogen, UA 0.2  0.0 - 1.0 (mg/dL)    Nitrite NEGATIVE  NEGATIVE     Leukocytes, UA SMALL (*) NEGATIVE    URINE MICROSCOPIC-ADD ON     Status: Normal   Collection Time   07/13/11  8:36 PM      Component Value Range Comment   Squamous Epithelial / LPF RARE  RARE     WBC, UA 3-6  <3 (WBC/hpf)    RBC / HPF 0-2  <3 (RBC/hpf)    Bacteria, UA RARE  RARE  POCT I-STAT, CHEM 8     Status: Abnormal   Collection Time   07/13/11  9:21 PM      Component Value Range Comment   Sodium 142  135 - 145 (mEq/L)    Potassium 3.7  3.5 - 5.1 (mEq/L)    Chloride 108  96 - 112 (mEq/L)    BUN 10  6 - 23 (mg/dL)    Creatinine, Ser 4.09  0.50 - 1.10 (mg/dL)    Glucose, Bld 811 (*) 70 - 99 (mg/dL)    Calcium, Ion 9.14  1.12 - 1.32 (mmol/L)    TCO2 22  0 - 100 (mmol/L)    Hemoglobin 11.9 (*) 12.0 - 15.0 (g/dL)    HCT 78.2 (*) 95.6 - 46.0 (%)   CARDIAC PANEL(CRET KIN+CKTOT+MB+TROPI)     Status: Normal   Collection Time   07/14/11 12:26 AM      Component Value Range Comment   Total CK 92  7 - 177 (U/L)    CK, MB 3.2  0.3 - 4.0 (ng/mL)    Troponin I <0.30  <0.30 (ng/mL)    Relative Index RELATIVE INDEX IS INVALID  0.0 - 2.5    HEMOGLOBIN A1C     Status: Abnormal   Collection  Time   07/14/11 12:27 AM      Component Value Range Comment   Hemoglobin A1C 6.0 (*) <5.7 (%)    Mean Plasma Glucose 126 (*) <117 (mg/dL)   LIPID PANEL     Status: Normal   Collection Time   07/14/11  5:54 AM      Component Value Range Comment   Cholesterol 132  0 - 200 (mg/dL)    Triglycerides 213  <150 (mg/dL)    HDL 47  >08 (mg/dL)    Total CHOL/HDL Ratio 2.8      VLDL 27  0 - 40 (mg/dL)    LDL Cholesterol 58  0 - 99 (mg/dL)   CARDIAC PANEL(CRET KIN+CKTOT+MB+TROPI)     Status: Normal   Collection Time   07/14/11  6:15 AM      Component Value Range Comment   Total CK 90  7 - 177 (U/L)    CK, MB 2.9  0.3 - 4.0 (ng/mL)    Troponin I <0.30  <0.30 (ng/mL)    Relative Index RELATIVE INDEX IS INVALID  0.0 - 2.5    GLUCOSE, CAPILLARY     Status: Normal   Collection Time   07/14/11  6:55 AM      Component Value Range Comment   Glucose-Capillary 73  70 - 99 (mg/dL)    Comment 1 Notify RN     GLUCOSE, CAPILLARY     Status: Normal   Collection Time   07/14/11 11:36 AM      Component Value Range Comment   Glucose-Capillary 77  70 - 99 (mg/dL)   CARDIAC PANEL(CRET KIN+CKTOT+MB+TROPI)     Status: Normal   Collection Time   07/14/11 12:33 PM      Component Value Range Comment   Total CK 91  7 - 177 (U/L)    CK, MB 2.8  0.3 - 4.0 (ng/mL)    Troponin I <0.30  <0.30 (ng/mL)    Relative Index RELATIVE INDEX IS INVALID  0.0 - 2.5      Ct Head Wo Contrast  07/13/2011  *RADIOLOGY REPORT*  Clinical Data: Left facial droop  CT HEAD WITHOUT CONTRAST  Technique:  Contiguous axial images were obtained from the  base of the skull through the vertex without contrast.  Comparison: None.  Findings: Chronic ischemic changes.  Mild global atrophy. Hypodensity in the anterior limb of the right internal capsule is slightly more pronounced.  No mass effect, midline shift, or acute intracranial hemorrhage. Dense mucosal thickening in the right maxillary sinus is redemonstrated.  There is thickening of the wall of  the right maxillary sinus indicating chronic inflammation.  IMPRESSION: Chronic ischemic changes and atrophy are noted.  Hypodensity in the right internal capsule is slightly more pronounced an acute ischemia in this vicinity is not excluded.  Original Report Authenticated By: Donavan Burnet, M.D.   Mr Angiogram Head Wo Contrast  07/14/2011  *RADIOLOGY REPORT*  Clinical Data:  Dizziness after blood donation.  Left facial droop and aphasia, now resolved.  MRI HEAD WITHOUT CONTRAST MRA HEAD WITHOUT CONTRAST  Technique:  Multiplanar, multiecho pulse sequences of the brain and surrounding structures were obtained without intravenous contrast. Angiographic images of the head were obtained using MRA technique without contrast.  Comparison:  CT head earlier in the day.  MRI HEAD  Findings:  There is no evidence for acute infarction, intracranial hemorrhage, mass lesion, hydrocephalus, or extra-axial fluid.  Mild to moderate atrophy is present.  There is mild chronic microvascular ischemic change in the periventricular and subcortical white matter.  The major intracranial vascular structures are patent.  Within the sella turcica, there is an apparent pituitary lesion with cross-sectional measurements of 13 x 15 x 9 mm.  This lesion has slightly decreased signal on T2-weighted images and intermediate signal on T1-weighted images.  I cannot exclude the possibility there may be a hemorrhagic component. No chiasmatic compression.  Slight extension into the right cavernous sinus.  MRI of the pituitary without and with contrast is recommended for further evaluation.  Near complete opacity of the right maxillary sinus with sclerotic surrounding bone suggests chronicity.   No air-fluid levels are seen.  There is moderate cervical spondylosis which appears worst at C3-4, incompletely evaluated.  There is no mastoid fluid.  IMPRESSION: 13 x 15 x 9 mm intrasellar lesion, likely pituitary adenoma, possibly hemorrhagic.  Recommend  MRI of the brain without and with contrast for further evaluation.  No acute stroke.  Mild to moderate atrophy and chronic microvascular ischemic change.  MRA HEAD  Findings: Widely patent internal carotid arteries, although the right ICA appears mildly stretched and displaced, possibly from the intrasellar lesion.  Basilar artery is widely patent with the right greater than left vertebral arteries contributing.  No intracranial stenosis or aneurysm.  IMPRESSION: Mild uncoiling of the right internal carotid artery in its cavernous segment could reflect the intrasellar lesion.  No focal stenosis.  Original Report Authenticated By: Elsie Stain, M.D.   Mr Brain Wo Contrast  07/14/2011  *RADIOLOGY REPORT*  Clinical Data:  Dizziness after blood donation.  Left facial droop and aphasia, now resolved.  MRI HEAD WITHOUT CONTRAST MRA HEAD WITHOUT CONTRAST  Technique:  Multiplanar, multiecho pulse sequences of the brain and surrounding structures were obtained without intravenous contrast. Angiographic images of the head were obtained using MRA technique without contrast.  Comparison:  CT head earlier in the day.  MRI HEAD  Findings:  There is no evidence for acute infarction, intracranial hemorrhage, mass lesion, hydrocephalus, or extra-axial fluid.  Mild to moderate atrophy is present.  There is mild chronic microvascular ischemic change in the periventricular and subcortical white matter.  The major intracranial vascular structures are patent.  Within the  sella turcica, there is an apparent pituitary lesion with cross-sectional measurements of 13 x 15 x 9 mm.  This lesion has slightly decreased signal on T2-weighted images and intermediate signal on T1-weighted images.  I cannot exclude the possibility there may be a hemorrhagic component. No chiasmatic compression.  Slight extension into the right cavernous sinus.  MRI of the pituitary without and with contrast is recommended for further evaluation.  Near complete  opacity of the right maxillary sinus with sclerotic surrounding bone suggests chronicity.   No air-fluid levels are seen.  There is moderate cervical spondylosis which appears worst at C3-4, incompletely evaluated.  There is no mastoid fluid.  IMPRESSION: 13 x 15 x 9 mm intrasellar lesion, likely pituitary adenoma, possibly hemorrhagic.  Recommend MRI of the brain without and with contrast for further evaluation.  No acute stroke.  Mild to moderate atrophy and chronic microvascular ischemic change.  MRA HEAD  Findings: Widely patent internal carotid arteries, although the right ICA appears mildly stretched and displaced, possibly from the intrasellar lesion.  Basilar artery is widely patent with the right greater than left vertebral arteries contributing.  No intracranial stenosis or aneurysm.  IMPRESSION: Mild uncoiling of the right internal carotid artery in its cavernous segment could reflect the intrasellar lesion.  No focal stenosis.  Original Report Authenticated By: Elsie Stain, M.D.    Review of Systems  Constitutional: Negative for fever, chills, weight loss, malaise/fatigue and diaphoresis.  HENT: Negative.   Eyes: Negative.  Negative for blurred vision, double vision, photophobia, pain, discharge and redness.  Respiratory: Negative.   Cardiovascular: Negative.   Gastrointestinal: Negative.   Genitourinary: Negative.   Musculoskeletal: Negative.   Skin: Negative.  Negative for itching and rash.  Neurological: Positive for loss of consciousness and weakness.  Endo/Heme/Allergies:       Recent blood donation  Psychiatric/Behavioral: Negative.    Blood pressure 120/77, pulse 68, temperature 98.3 F (36.8 C), temperature source Oral, resp. rate 17, height 5\' 3"  (1.6 m), weight 60.3 kg (132 lb 15 oz), SpO2 96.00%. Physical Exam  Constitutional: She is oriented to person, place, and time. She appears well-developed and well-nourished.  HENT:  Head: Normocephalic and atraumatic.  Eyes:  Conjunctivae and EOM are normal. Pupils are equal, round, and reactive to light.  Neck: Normal range of motion. Neck supple.  Cardiovascular: Normal rate, regular rhythm, normal heart sounds and intact distal pulses.   Respiratory: Effort normal and breath sounds normal.  GI: Soft. Bowel sounds are normal.  Musculoskeletal: Normal range of motion. She exhibits no edema and no tenderness.  Neurological: She is alert and oriented to person, place, and time. She has normal reflexes. She displays normal reflexes. No cranial nerve deficit. She exhibits normal muscle tone. Coordination normal.  Skin: Skin is warm and dry.  Psychiatric: She has a normal mood and affect. Her behavior is normal. Judgment and thought content normal.    Assessment/Plan: Pituitary lesion by CT and MRI.  Patient will require repeat MRI with specific pituitary protocol my suspicion is that this is a long-standing lesion that is not likely related to her current symptom complex and unless there are either visual changes or profound endocrinologic changes I would simply follow this lesion in this individual. We will review the MRI when it is performed.  Yanilen Adamik J 07/14/2011, 3:02 PM

## 2011-07-14 NOTE — Evaluation (Signed)
Physical Therapy Evaluation Patient Details Name: Susan [name] MRN: [id number] DOB: 03-09-1934 Today's Date: 07/14/2011  Problem List:  Patient Active Problem List  Diagnoses  . Coronary artery disease  . Hypothyroidism  . Speech abnormality  . TIA (transient ischemic attack)  . Dehydration    Past Medical History:  Past Medical History  Diagnosis Date  . Coronary artery disease     POST PTCA AND STENTING OF HER RIGHT CORONARY ARTERY  . LAD stenosis     MODERATE 50-60% STENOSIS  . Hypothyroidism   . Osteoporosis    Past Surgical History:  Past Surgical History  Procedure Date  . Knee surgery     arthroscopic  torn meniscus  . Lumbar laminectomy     years  ago  . Ovarian cyst rupture and appy   . Fractured wrist repair     PT Assessment/Plan/Recommendation PT Assessment Clinical Impression Statement: Patient presents with NIHSS2 (now 0) with symptoms suggestive of TIA. She presents with no physical deficits and is a very active 78 y/o lady. I anticipate no issues with return to all prior activities. We have discussed her risk factors for stroke and the warning signs as patient reports reading the beyond hospital booklet and is trying to educate herself to prevent future issues. PT Recommendation/Assessment: Patent does not need any further PT services No Skilled PT: All education completed;Patient at baseline level of functioning;Patient is independent with all acitivity/mobility PT Recommendation Follow Up Recommendations: No PT follow up Equipment Recommended: None recommended by PT PT Goals     PT Evaluation Precautions/Restrictions    Prior Functioning  Home Living Lives With: Alone Type of Home: House Home Layout: One level Home Access: Stairs to enter Entrance Stairs-Rails: Left Entrance Stairs-Number of Steps: 4 Bathroom Toilet: Standard Home Adaptive Equipment: None Prior Function Level of Independence: Independent with basic ADLs;Independent with  homemaking with ambulation Driving: Yes 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 Cognition Cognition Arousal/Alertness: Awake/alert Overall Cognitive Status: Appears within functional limits for tasks assessed Orientation Level: Oriented X4 Sensation/Coordination Sensation Light Touch: Appears Intact Proprioception: Appears Intact Coordination Gross Motor Movements are Fluid and Coordinated: Yes Fine Motor Movements are Fluid and Coordinated: Yes Extremity Assessment RLE Assessment RLE Assessment: Within Functional Limits LLE Assessment LLE Assessment: Within Functional Limits Mobility (including Balance) Bed Mobility Bed Mobility: Yes Rolling Right: 7: Independent Supine to Sit: 7: Independent Sit to Supine: 7: Independent Transfers Transfers: Yes Sit to Stand: 7: Independent;From bed;Without upper extremity assist Stand to Sit: 7: Independent;To bed;Without upper extremity assist Ambulation/Gait Ambulation/Gait: Yes Ambulation/Gait Assistance: 7: Independent Ambulation Distance (Feet): 400 Feet Assistive device: None Gait Pattern: Within Functional Limits Stairs: Yes Stairs Assistance: 7: Independent Stair Management Technique: No rails;Alternating pattern;Forwards Number of Stairs: 5   Posture/Postural Control Posture/Postural Control: No significant limitations Balance Balance Assessed: Yes Static Standing Balance Static Standing - Balance Support: No upper extremity supported Static Standing - Level of Assistance: 7: Independent Single Leg Stance - Right Leg: 10  Single Leg Stance - Left Leg: 10  Tandem Stance - Right Leg: 30  Tandem Stance - Left Leg: 30  Rhomberg - Eyes Opened: 60  Rhomberg - Eyes Closed: 60  Balance activities terminated by PT secondary to goal met Dynamic Gait Index Level Surface: Normal Change in Gait Speed: Normal Gait with Horizontal Head Turns: Normal Gait with Vertical Head Turns:  Normal Gait and Pivot Turn: Normal Step Over Obstacle: Normal Step Around Obstacles:  Normal Steps: Normal Total Score: 24  End of Session PT - End of Session Equipment Utilized During Treatment: Gait belt Activity Tolerance: Patient tolerated treatment well Patient left: in bed;with call bell in reach Nurse Communication: Mobility status for transfers;Mobility status for ambulation General Behavior During Session: [facility] for tasks performed Cognition: [facility] for tasks performed  Edwyna Perfect, PT  Pager [phone]  07/14/2011, 8:47 AM

## 2011-07-14 NOTE — Progress Notes (Signed)
Utilization Review Completed.Susan [name] T3/04/2012   

## 2011-07-14 NOTE — Progress Notes (Addendum)
Patient ID: Susan [name], female   DOB: October 26, 1933, 78 y.o.   MRN: [id number]  Assessment/Plan:  Principal Problem:   *Pre-syncope - unclear etiology but perhaps related to pituitary adenoma versus TIA - proceed with stroke work up; 2 D ECHO and carotid doppler - as per neurology, CT angio neck to explore carotid artery disease - neurosurgery consult appreciated for input on management of pituitary adenoma - follow up MRI brain with contrast - follow up prolactin, TSH, LH, FSH, ACTH levels - continue aspirin and plavix - PT evaluation - no PT follow up recomended  Active Problems:   HYPOTHYROIDISM - follow up TSH level - continue levothyroxine   EDUCATION - test results and diagnostic studies were discussed with patientat the bedside - patient has verbalized the understanding - questions were answered at the bedside and contact information was provided for additional questions or concerns  CONSULTS TO DATE: 1. NEUROLOGY 2. NEUROSURGERY 3. - PHYSICAL THERAPY  Subjective: No events overnight. Patient denies chest pain, shortness of breath, abdominal pain.   Objective:  Vital signs in last 24 hours:  Filed Vitals:   07/14/11 0300 07/14/11 0505 07/14/11 0653 07/14/11 1000  BP: 97/61 104/63 113/74 102/67  Pulse: 79 73 72 75  Temp: 98.1 F (36.7 C) 97.9 F (36.6 C) 97.9 F (36.6 C) 98.9 F (37.2 C)  TempSrc: Oral Oral Axillary Oral  Resp: 16 17 17 17   SpO2: 95% 93% 93% 95%    Intake/Output from previous day:   Intake/Output Summary (Last 24 hours) at 07/14/11 1404 Last data filed at 07/14/11 0620  Gross per 24 hour  Intake 458.33 ml  Output      0 ml  Net 458.33 ml    Physical Exam: General: Alert, awake, oriented x3, in no acute distress. HEENT: No bruits, no goiter. Moist mucous membranes, no scleral icterus, no conjunctival pallor. Heart: Regular rate and rhythm, S1/S2 +, no murmurs, rubs, gallops. Lungs: Clear to auscultation bilaterally. No  wheezing, no rhonchi, no rales.  Abdomen: Soft, nontender, nondistended, positive bowel sounds. Extremities: No clubbing or cyanosis, no pitting edema,  positive pedal pulses. Neuro: Grossly nonfocal.  Lab Results:   Lab 07/13/11 2121 07/13/11 1930  WBC -- 8.5  HGB 11.9* 11.1*  HCT 35.0* 33.2*  PLT -- 190  MCV -- 92.0    Lab 07/13/11 2121 07/13/11 1930  NA 142 136  K 3.7 3.5  CL 108 104  CO2 -- 19  GLUCOSE 111* 128*  BUN 10 12  CREATININE 1.00 0.84    Lab 07/13/11 1930  INR 1.08   Cardiac markers:  Lab 07/14/11 0615 07/14/11 0026 07/13/11 1930  CKMB 2.9 3.2 3.2  TROPONINI <0.30 <0.30 <0.30    Studies/Results: Ct Head Wo Contrast 07/13/2011   IMPRESSION: Chronic ischemic changes and atrophy are noted.  Hypodensity in the right internal capsule is slightly more pronounced an acute ischemia in this vicinity is not excluded.    Mr Angiogram Head Wo Contrast 07/14/2011  IMPRESSION: 13 x 15 x 9 mm intrasellar lesion, likely pituitary adenoma, possibly hemorrhagic.  Recommend MRI of the brain without and with contrast for further evaluation.  No acute stroke.  Mild to moderate atrophy and chronic microvascular ischemic change.  MRA HEAD  Findings: Widely patent internal carotid arteries, although the right ICA appears mildly stretched and displaced, possibly from the intrasellar lesion.  Basilar artery is widely patent with the right greater than left vertebral arteries contributing.  No intracranial stenosis or aneurysm.  IMPRESSION: Mild uncoiling of the right internal carotid artery in its cavernous segment could reflect the intrasellar lesion.  No focal stenosis.    Mr Brain Wo Contrast 07/14/2011   IMPRESSION: 13 x 15 x 9 mm intrasellar lesion, likely pituitary adenoma, possibly hemorrhagic.  Recommend MRI of the brain without and with contrast for further evaluation.  No acute stroke.  Mild to moderate atrophy and chronic microvascular ischemic change.  MRA HEAD  Findings:  Widely patent internal carotid arteries, although the right ICA appears mildly stretched and displaced, possibly from the intrasellar lesion.  Basilar artery is widely patent with the right greater than left vertebral arteries contributing.  No intracranial stenosis or aneurysm.  IMPRESSION: Mild uncoiling of the right internal carotid artery in its cavernous segment could reflect the intrasellar lesion.  No focal stenosis.     Medications: Scheduled Meds:   . aspirin  324 mg Oral Once  . aspirin  81 mg Oral Daily  . atorvastatin  40 mg Oral q1800  . clopidogrel  75 mg Oral Q breakfast  . levothyroxine  50 mcg Oral QAC breakfast  . ondansetron      . venlafaxine  150 mg Oral Daily  . vitamin B-12  1,000 mcg Oral Daily  . DISCONTD: aspirin  81 mg Oral Daily     LOS: 1 day   Susan [name] 07/14/2011, 2:04 PM  [facility] Pager: [phone]

## 2011-07-14 NOTE — Progress Notes (Signed)
Subjective:  Susan [name] is an 78 y.o. female who was at [facility] today giving blood. Did well but afterward became dizzy and felt as if she was going to pass out. Then became unable to get her words out. Code stroke was called and patient was transported to [facility] for further evaluation. Initial NIHSS of 0. She denies any focal neurological symptoms. She has no known history of carotid disease. She has no new complaints today  Objective: Vital signs in last 24 hours: Temp:  [97.5 F (36.4 C)-98.9 F (37.2 C)] 98.9 F (37.2 C) (03/12 1000) Pulse Rate:  [72-88] 75  (03/12 1000) Resp:  [13-25] 17  (03/12 1000) BP: (95-152)/(52-78) 102/67 mmHg (03/12 1000) SpO2:  [93 %-100 %] 95 % (03/12 1000) Weight:  [60.3 kg (132 lb 15 oz)] 60.3 kg (132 lb 15 oz) (03/11 2306) Weight change:     Intake/Output from previous day: 03/11 0701 - 03/12 0700 In: 458.3 [I.V.:458.3] Out: -  Intake/Output this shift:    Physical exam pleasant elderly Caucasian lady currently not in distress. Head is non traumatic. Neck is supple with soft right carotid bruit. Hearing is mildly decreased bilaterally. Cardiac exam no murmur or gallop. Lungs are clear to auscultation. Abdomen soft nontender.  Neurological exam Awake  Alert oriented x 3. Normal speech and language.eye movements full without nystagmus. Face symmetric. Tongue midline. Normal strength, tone, reflexes and coordination. Normal sensation. Gait deferred.  Lab Results:  [facility] 07/13/11 2121 07/13/11 1930  WBC -- 8.5  HGB 11.9* 11.1*  HCT 35.0* 33.2*  PLT -- 190   BMET  Basename 07/13/11 2121 07/13/11 1930  NA 142 136  K 3.7 3.5  CL 108 104  CO2 -- 19  GLUCOSE 111* 128*  BUN 10 12  CREATININE 1.00 0.84  CALCIUM -- 8.8    Studies/Results: Ct Head Wo Contrast  07/13/2011  *RADIOLOGY REPORT*  Clinical Data: Left facial droop  CT HEAD WITHOUT CONTRAST  Technique:  Contiguous axial images were obtained from the base of the skull through the  vertex without contrast.  Comparison: None.  Findings: Chronic ischemic changes.  Mild global atrophy. Hypodensity in the anterior limb of the right internal capsule is slightly more pronounced.  No mass effect, midline shift, or acute intracranial hemorrhage. Dense mucosal thickening in the right maxillary sinus is redemonstrated.  There is thickening of the wall of the right maxillary sinus indicating chronic inflammation.  IMPRESSION: Chronic ischemic changes and atrophy are noted.  Hypodensity in the right internal capsule is slightly more pronounced an acute ischemia in this vicinity is not excluded.  Original Report Authenticated By: Donavan Burnet, M.D.   Mr Angiogram Head Wo Contrast  07/14/2011  *RADIOLOGY REPORT*  Clinical Data:  Dizziness after blood donation.  Left facial droop and aphasia, now resolved.  MRI HEAD WITHOUT CONTRAST MRA HEAD WITHOUT CONTRAST  Technique:  Multiplanar, multiecho pulse sequences of the brain and surrounding structures were obtained without intravenous contrast. Angiographic images of the head were obtained using MRA technique without contrast.  Comparison:  CT head earlier in the day.  MRI HEAD  Findings:  There is no evidence for acute infarction, intracranial hemorrhage, mass lesion, hydrocephalus, or extra-axial fluid.  Mild to moderate atrophy is present.  There is mild chronic microvascular ischemic change in the periventricular and subcortical white matter.  The major intracranial vascular structures are patent.  Within the sella turcica, there is an apparent pituitary lesion with cross-sectional measurements of 13 x 15  x 9 mm.  This lesion has slightly decreased signal on T2-weighted images and intermediate signal on T1-weighted images.  I cannot exclude the possibility there may be a hemorrhagic component. No chiasmatic compression.  Slight extension into the right cavernous sinus.  MRI of the pituitary without and with contrast is recommended for further  evaluation.  Near complete opacity of the right maxillary sinus with sclerotic surrounding bone suggests chronicity.   No air-fluid levels are seen.  There is moderate cervical spondylosis which appears worst at C3-4, incompletely evaluated.  There is no mastoid fluid.  IMPRESSION: 13 x 15 x 9 mm intrasellar lesion, likely pituitary adenoma, possibly hemorrhagic.  Recommend MRI of the brain without and with contrast for further evaluation.  No acute stroke.  Mild to moderate atrophy and chronic microvascular ischemic change.  MRA HEAD  Findings: Widely patent internal carotid arteries, although the right ICA appears mildly stretched and displaced, possibly from the intrasellar lesion.  Basilar artery is widely patent with the right greater than left vertebral arteries contributing.  No intracranial stenosis or aneurysm.  IMPRESSION: Mild uncoiling of the right internal carotid artery in its cavernous segment could reflect the intrasellar lesion.  No focal stenosis.  Original Report Authenticated By: Elsie Stain, M.D.   Mr Brain Wo Contrast  07/14/2011  *RADIOLOGY REPORT*  Clinical Data:  Dizziness after blood donation.  Left facial droop and aphasia, now resolved.  MRI HEAD WITHOUT CONTRAST MRA HEAD WITHOUT CONTRAST  Technique:  Multiplanar, multiecho pulse sequences of the brain and surrounding structures were obtained without intravenous contrast. Angiographic images of the head were obtained using MRA technique without contrast.  Comparison:  CT head earlier in the day.  MRI HEAD  Findings:  There is no evidence for acute infarction, intracranial hemorrhage, mass lesion, hydrocephalus, or extra-axial fluid.  Mild to moderate atrophy is present.  There is mild chronic microvascular ischemic change in the periventricular and subcortical white matter.  The major intracranial vascular structures are patent.  Within the sella turcica, there is an apparent pituitary lesion with cross-sectional measurements of 13  x 15 x 9 mm.  This lesion has slightly decreased signal on T2-weighted images and intermediate signal on T1-weighted images.  I cannot exclude the possibility there may be a hemorrhagic component. No chiasmatic compression.  Slight extension into the right cavernous sinus.  MRI of the pituitary without and with contrast is recommended for further evaluation.  Near complete opacity of the right maxillary sinus with sclerotic surrounding bone suggests chronicity.   No air-fluid levels are seen.  There is moderate cervical spondylosis which appears worst at C3-4, incompletely evaluated.  There is no mastoid fluid.  IMPRESSION: 13 x 15 x 9 mm intrasellar lesion, likely pituitary adenoma, possibly hemorrhagic.  Recommend MRI of the brain without and with contrast for further evaluation.  No acute stroke.  Mild to moderate atrophy and chronic microvascular ischemic change.  MRA HEAD  Findings: Widely patent internal carotid arteries, although the right ICA appears mildly stretched and displaced, possibly from the intrasellar lesion.  Basilar artery is widely patent with the right greater than left vertebral arteries contributing.  No intracranial stenosis or aneurysm.  IMPRESSION: Mild uncoiling of the right internal carotid artery in its cavernous segment could reflect the intrasellar lesion.  No focal stenosis.  Original Report Authenticated By: Elsie Stain, M.D.   Carotid Doppler preliminary results suggest 60-79% right ICA stenosis   Medications: I have reviewed the patient's current medications.  Assessment/Plan:  32 year Caucasian lady with near syncopal episode following blood donation and non-focal neurological exam. MRI shows no evidence of stroke. She does have moderate extracranial carotid stenosis which could have been hemodynamically significant. Plan check CT angiogram of the neck to evaluate the right carotid stenosis further. Continue aspirin and rest of the stroke workup.  LOS: 1 day    Angila Wombles,PRAMODKUMAR P 07/14/2011, 12:21 PM

## 2011-07-14 NOTE — Progress Notes (Signed)
Bilateral carotid artery duplex completed.  Preliminary report is 60-79% ICA stenosis on the right and no evidence of significant stenosis on the left.

## 2011-07-15 DIAGNOSIS — D352 Benign neoplasm of pituitary gland: Secondary | ICD-10-CM | POA: Diagnosis present

## 2011-07-15 LAB — CBC
Hemoglobin: 9.8 g/dL — ABNORMAL LOW (ref 12.0–15.0)
MCH: 30.7 pg (ref 26.0–34.0)
MCHC: 33.1 g/dL (ref 30.0–36.0)
Platelets: 171 10*3/uL (ref 150–400)

## 2011-07-15 LAB — BASIC METABOLIC PANEL
Calcium: 8.5 mg/dL (ref 8.4–10.5)
GFR calc Af Amer: 74 mL/min — ABNORMAL LOW (ref >90)
GFR calc non Af Amer: 64 mL/min — ABNORMAL LOW (ref >90)
Glucose, Bld: 84 mg/dL (ref 70–99)
Potassium: 3.8 mEq/L (ref 3.5–5.1)
Sodium: 140 mEq/L (ref 135–145)

## 2011-07-15 NOTE — Progress Notes (Signed)
OT Discharge Note  Patient is being discharged from OT services secondary to:    Goals met and no further therapy needs identified.  Please see latest Therapy Progress Note for current level of functioning and progress toward goals.  Progress and discharge plan and discussed with patient/caregiver and they    Agree     Pt reports "i am back to myself" pt with no sensation changes or visual changes at this time. Pt independently dressed per pt report. Pt observed sit<>stand from chair to answer phone across the room during session.  OT TO SIGN OFF  Lucile Shutters   OTR/L Pager: [phone] Office: [phone] .

## 2011-07-15 NOTE — Progress Notes (Signed)
Patient ID: Susan [name], female   DOB: 12-07-1933, 78 y.o.   MRN: [id number] MRI reviewed. Lesion is most consistent with pituitary macroadenoma. Lesion does not appear to be compressing the optic chiasm or the optic apparatus. If patient is endocrinologically stable this lesion can be observed and I would recommend a followup MRI in one years time. If it is consistent with a prolactinoma patient can be treated medically. Will follow patient as an outpatient basis.

## 2011-07-15 NOTE — Discharge Instructions (Signed)
STROKE/TIA DISCHARGE INSTRUCTIONS SMOKING Cigarette smoking nearly doubles your risk of having a stroke & is the single most alterable risk factor  If you smoke or have smoked in the last 12 months, you are advised to quit smoking for your health.  Most of the excess cardiovascular risk related to smoking disappears within a year of stopping.  Ask you doctor about anti-smoking medications  [place] Quit Line: 1-800-QUIT NOW  Free Smoking Cessation Classes [phone]  CHOLESTEROL Know your levels; limit fat & cholesterol in your diet  Lipid Panel     Component Value Date/Time   CHOL 132 07/14/2011 0554   TRIG 137 07/14/2011 0554   HDL 47 07/14/2011 0554   CHOLHDL 2.8 07/14/2011 0554   VLDL 27 07/14/2011 0554   LDLCALC 58 07/14/2011 0554      Many patients benefit from treatment even if their cholesterol is at goal.  Goal: Total Cholesterol (CHOL) less than 160  Goal:  Triglycerides (TRIG) less than 150  Goal:  HDL greater than 40  Goal:  LDL (LDLCALC) less than 100   BLOOD PRESSURE American Stroke Association blood pressure target is less that 120/80 mm/Hg  Your discharge blood pressure is:  BP: 105/66 mmHg  Monitor your blood pressure  Limit your salt and alcohol intake  Many individuals will require more than one medication for high blood pressure  DIABETES (A1c is a blood sugar average for last 3 months) Goal HGBA1c is under 7% (HBGA1c is blood sugar average for last 3 months)  Diabetes: {STROKE DC DIABETES:22357}    Lab Results  Component Value Date   HGBA1C 6.0* 07/14/2011     Your HGBA1c can be lowered with medications, healthy diet, and exercise.  Check your blood sugar as directed by your physician  Call your physician if you experience unexplained or low blood sugars.  PHYSICAL ACTIVITY/REHABILITATION Goal is 30 minutes at least 4 days per week    {STROKE DC ACTIVITY/REHAB:22359}  Activity decreases your risk of heart attack and stroke and makes your heart  stronger.  It helps control your weight and blood pressure; helps you relax and can improve your mood.  Participate in a regular exercise program.  Talk with your doctor about the best form of exercise for you (dancing, walking, swimming, cycling).  DIET/WEIGHT Goal is to maintain a healthy weight  Your discharge diet is: Cardiac *** liquids Your height is:  Height: 5\' 3"  (160 cm) Your current weight is: Weight: 60.3 kg (132 lb 15 oz) Your Body Mass Index (BMI) is:  BMI (Calculated): 23.6   Following the type of diet specifically designed for you will help prevent another stroke.  Your goal weight range is:  ***  Your goal Body Mass Index (BMI) is 19-24.  Healthy food habits can help reduce 3 risk factors for stroke:  High cholesterol, hypertension, and excess weight.  RESOURCES Stroke/Support Group:  Call [phone]  they meet the 3rd Sunday of the month on the Rehab Unit at [facility], [place] ( no meetings June, July & Aug).  STROKE EDUCATION PROVIDED/REVIEWED AND GIVEN TO PATIENT Stroke warning signs and symptoms How to activate emergency medical system (call 911). Medications prescribed at discharge. Need for follow-up after discharge. Personal risk factors for stroke. Pneumonia vaccine given:   {STROKE DC YES/NO/DATE:22363} Flu vaccine given:   {STROKE DC YES/NO/DATE:22363} My questions have been answered, the writing is legible, and I understand these instructions.  I will adhere to these goals & educational materials that have  been provided to me after my discharge from the hospital.

## 2011-07-30 NOTE — Discharge Summary (Signed)
Physician Discharge Summary  Patient ID: Susan [name] MRN: [id number] DOB/AGE: Aug 21, 1933 78 y.o.  Admit date: 07/13/2011 Discharge date: 07/30/2011  Primary Care Physician:  Ezequiel Kayser, MD, MD   Discharge Diagnoses:   1. Pre-syncope 2. pituitary macroadenoma 3. 50-60% stenosis of the left vertebral artery 4. Coronary artery disease, status post PCI and stenting of the RCA, LAD with 50% stenosis 5. Hypothyroidism 6. Dehydration 7. osteoporosis   Medication List  As of 07/30/2011  3:03 PM   TAKE these medications         aspirin 81 MG tablet   Take 81 mg by mouth daily.      CALCIUM + D PO   Take by mouth 2 (two) times daily.      clopidogrel 75 MG tablet   Commonly known as: PLAVIX   Take 1 tablet (75 mg total) by mouth daily.      fish oil-omega-3 fatty acids 1000 MG capsule   Take by mouth daily.      levothyroxine 50 MCG tablet   Commonly known as: SYNTHROID, LEVOTHROID   Take 50 mcg by mouth daily.      nitroGLYCERIN 0.4 MG SL tablet   Commonly known as: NITROSTAT   Place 1 tablet (0.4 mg total) under the tongue every 5 (five) minutes as needed.      PRESERVISION/LUTEIN PO   Take by mouth daily.      rosuvastatin 20 MG tablet   Commonly known as: CRESTOR   Take 1 tablet (20 mg total) by mouth daily.      venlafaxine 150 MG 24 hr capsule   Commonly known as: EFFEXOR-XR   Take 150 mg by mouth daily.      vitamin B-12 1000 MCG tablet   Commonly known as: CYANOCOBALAMIN   Take 1,000 mcg by mouth daily.      zolendronic acid 4 MG/5ML injection   Commonly known as: ZOMETA   Inject 4 mg into the vein once.             Disposition and Follow-up:  1. PCP in 1 week 2. Dr.Elsner in 1 year  Consults:  1. Dr.Sethi-neurology 2. Dr.Elsner-neurosurgery   Significant Diagnostic Studies:  Ct Head Wo Contrast 07/13/2011 .  IMPRESSION: Chronic ischemic changes and atrophy are noted.  Hypodensity in the right internal capsule is slightly more  pronounced an acute ischemia in this vicinity is not excluded.  Original Report Authenticated By: Donavan Burnet, M.D.   Ct Angio Neck W/cm &/or Wo/cm 07/14/2011  IMPRESSION:  1.  50-60% stenosis of the proximal left vertebral artery. 2.  Atherosclerotic calcifications at the carotid bifurcations bilaterally without significant stenosis relative to the distal vessels. 3.  Fibromuscular dysplasia bilaterally. 4.  Small ulceration of the right proximal internal carotid artery. 5.  Focal spondylosis of the cervical spine at C3-4.  Original Report Authenticated By: Jamesetta Orleans. MATTERN, M.D.   Mr Angiogram Head Wo Contrast 07/14/2011  IMPRESSION: 13 x 15 x 9 mm intrasellar lesion, likely pituitary adenoma, possibly hemorrhagic.  Recommend MRI of the brain without and with contrast for further evaluation.  No acute stroke.  Mild to moderate atrophy and chronic microvascular ischemic change.  MRA HEAD  Findings: Widely patent internal carotid arteries, although the right ICA appears mildly stretched and displaced, possibly from the intrasellar lesion.  Basilar artery is widely patent with the right greater than left vertebral arteries contributing.  No intracranial stenosis or aneurysm.  IMPRESSION: Mild uncoiling of the right  internal carotid artery in its cavernous segment could reflect the intrasellar lesion.  No focal stenosis.  Original Report Authenticated By: Elsie Stain, M.D.   Mr Brain Wo Contrast 07/14/2011    IMPRESSION: 13 x 15 x 9 mm intrasellar lesion, likely pituitary adenoma, possibly hemorrhagic.  Recommend MRI of the brain without and with contrast for further evaluation.  No acute stroke.  Mild to moderate atrophy and chronic microvascular ischemic change.  MRA HEAD  Findings: Widely patent internal carotid arteries, although the right ICA appears mildly stretched and displaced, possibly from the intrasellar lesion.  Basilar artery is widely patent with the right greater than left vertebral  arteries contributing.  No intracranial stenosis or aneurysm.  IMPRESSION: Mild uncoiling of the right internal carotid artery in its cavernous segment could reflect the intrasellar lesion.  No focal stenosis.  Original Report Authenticated By: Elsie Stain, M.D.    Brief H and P: Feeling like she is about to pass out. On the day of admission  she mowed her yard, had only couple of crackers and apple for lunch and imidiatly went to church and donated blood almost immediately fell weak and felt like she about to pass out. No chest pain no numbness no weakness. Her blood pressure went down to 60's. She was brought to [facility]. While at [facility] she developed trouble answering questions and code stroke was called her symptoms rapidly improved. She remembers the event states she could not understand questions that are asked of her. NO TPA was given patient is being admitted to medicine.   Hospital Course:  1. Pre-syncope: This was apparently associated questionable speech abnormality and hence initially there was concern for TIA versus CVA. She was seen by Dr.Sethi-neurology in consultation who recommended a full stroke workup with MRI MRA carotid Dopplers etc. her stroke workup was negative however it did reveal a small pituitary macro adenoma. Suspected that her presyncope was likely secondary to a vagal response in the setting of blood donation that day and dehydration. She also ruled out for acute MI based on EKG and cardiac markers, and 2-D echocardiogram was unremarkable as well. 2. Pituitary adenoma: This was an incidental finding on MRI , She was seen by Dr. Barnett Abu in consultation, and hence was evaluated for pituitary-related endocrine abnormalities. Her prolactin FSH and LH levels all came back normal. Dr. Danielle Dess recommend followup MRI in one year.   Time spent on Discharge:  Signed: Daiveon Markman [facility] Pager: [phone] 07/30/2011, 3:03 PM

## 2011-08-04 DIAGNOSIS — H3530 Unspecified macular degeneration: Secondary | ICD-10-CM | POA: Insufficient documentation

## 2012-01-05 ENCOUNTER — Encounter: Payer: Self-pay | Admitting: *Deleted

## 2012-01-20 ENCOUNTER — Encounter: Payer: Self-pay | Admitting: Cardiovascular Disease

## 2012-02-16 ENCOUNTER — Other Ambulatory Visit: Payer: Self-pay | Admitting: Neurological Surgery

## 2012-02-16 DIAGNOSIS — D497 Neoplasm of unspecified behavior of endocrine glands and other parts of nervous system: Secondary | ICD-10-CM

## 2012-02-17 ENCOUNTER — Ambulatory Visit (INDEPENDENT_AMBULATORY_CARE_PROVIDER_SITE_OTHER): Payer: Medicare Other | Admitting: Cardiovascular Disease

## 2012-02-17 ENCOUNTER — Encounter: Payer: Self-pay | Admitting: Cardiovascular Disease

## 2012-02-17 VITALS — BP 131/78 | HR 89 | Ht 64.0 in | Wt 133.6 lb

## 2012-02-17 DIAGNOSIS — I2510 Atherosclerotic heart disease of native coronary artery without angina pectoris: Secondary | ICD-10-CM

## 2012-02-17 NOTE — Progress Notes (Signed)
Susan [name] Date of Birth  [birth year] [facility]      Office  [address]    [address]   [address] [place], [place]  [address]    [place], [place]  [address] [phone]  Fax  [phone]  [phone]  Fax [phone]  Problem list: 1. Coronary artery disease-status post PTCA and stenting of her right coronary artery. We placed a 2.75 x 16 mm Taxus stent deployed at 18 atmospheres. She also has a moderate proximal LAD stenosis. 2. Hypothyroidism 3. Hyperlipidemia-   History of Present Illness:   Susan [name] is a 78 yo with a hx of CAD.  She is still playing tennis regularly.  She has not had any angina.  She has some DOE.     Current Outpatient Prescriptions on File Prior to Visit  Medication Sig Dispense Refill  . aspirin 81 MG tablet Take 81 mg by mouth daily.        . Calcium Carbonate-Vitamin D (CALCIUM + D PO) Take by mouth 2 (two) times daily.        . clopidogrel (PLAVIX) 75 MG tablet Take 1 tablet (75 mg total) by mouth daily.  30 tablet  1  . fish oil-omega-3 fatty acids 1000 MG capsule Take by mouth daily.        Marland Kitchen levothyroxine (SYNTHROID, LEVOTHROID) 50 MCG tablet Take 50 mcg by mouth daily. Take 1 and 1/2 pill      . Multiple Vitamins-Minerals (PRESERVISION/LUTEIN PO) Take by mouth daily.       . nitroGLYCERIN (NITROSTAT) 0.4 MG SL tablet Place 1 tablet (0.4 mg total) under the tongue every 5 (five) minutes as needed.  25 tablet  3  . rosuvastatin (CRESTOR) 20 MG tablet Take 1 tablet (20 mg total) by mouth daily.  90 tablet  3  . venlafaxine (EFFEXOR-XR) 150 MG 24 hr capsule Take 150 mg by mouth daily.        . vitamin B-12 (CYANOCOBALAMIN) 1000 MCG tablet Take 1,000 mcg by mouth daily.        Marland Kitchen zolendronic acid (ZOMETA) 4 MG/5ML injection Inject 4 mg into the vein once.        No Known Allergies  Past Medical History  Diagnosis Date  . Coronary artery disease     POST PTCA AND STENTING OF HER RIGHT CORONARY ARTERY  . LAD  stenosis     MODERATE 50-60% STENOSIS  . Hypothyroidism   . Osteoporosis     Past Surgical History  Procedure Date  . Knee surgery     arthroscopic  torn meniscus  . Lumbar laminectomy     years  ago  . Ovarian cyst rupture and appy   . Fractured wrist repair     History  Smoking status  . Never Smoker   Smokeless tobacco  . Not on file    History  Alcohol Use  . Yes    wine occasional    Family History  Problem Relation Age of Onset  . Alzheimer's disease Mother   . Heart attack Father   . Aneurysm Sister     Reviw of Systems:  Reviewed in the HPI.  All other systems are negative.  Physical Exam: Blood pressure 131/78, pulse 89, height 5\' 4"  (1.626 m), weight 133 lb 9.6 oz (60.601 kg). General: Well developed, well nourished, in no acute distress.  Head: Normocephalic, atraumatic, sclera non-icteric, mucus membranes are moist,   Neck: Supple. Negative for carotid  bruits. JVD not elevated.  Lungs: Clear bilaterally to auscultation without wheezes, rales, or rhonchi. Breathing is unlabored.  Heart: RRR with S1 S2. No murmurs, rubs, or gallops appreciated.  Abdomen: Soft, non-tender, non-distended with normoactive bowel sounds. No hepatomegaly. No rebound/guarding. No obvious abdominal masses.  Msk:  Strength and tone appear normal for age.  Extremities: No clubbing or cyanosis. No edema.  Distal pedal pulses are 2+ and equal bilaterally.  Neuro: Alert and oriented X 3. Moves all extremities spontaneously.  Psych:  Responds to questions appropriately with a normal affect.  ECG:  Assessment / Plan:

## 2012-02-17 NOTE — Patient Instructions (Addendum)
Your physician wants you to follow-up in: 6 months  You will receive a reminder letter in the mail two months in advance. If you don't receive a letter, please call our office to schedule the follow-up appointment.  Your physician recommends that you continue on your current medications as directed. Please refer to the Current Medication list given to you today.  

## 2012-02-17 NOTE — Assessment & Plan Note (Signed)
Susan [name] is doing very well. She's not had any episodes of chest pain. We'll continue with her same medications. Please that she's been able to remain active with tennis.  See her again in 6 months. We'll check fasting lipids at that time.

## 2012-02-22 ENCOUNTER — Encounter: Payer: Self-pay | Admitting: Cardiovascular Disease

## 2012-02-23 ENCOUNTER — Ambulatory Visit
Admission: RE | Admit: 2012-02-23 | Discharge: 2012-02-23 | Disposition: A | Discharge status: Home / Self Care | Payer: Medicare Other | Source: Ambulatory Visit | Attending: Neurological Surgery | Admitting: Neurological Surgery

## 2012-02-23 DIAGNOSIS — D497 Neoplasm of unspecified behavior of endocrine glands and other parts of nervous system: Secondary | ICD-10-CM

## 2012-02-23 MED ORDER — GADOBENATE DIMEGLUMINE 529 MG/ML IV SOLN
9.0000 mL | Freq: Once | INTRAVENOUS | Status: AC | PRN
  Administered 2012-02-23: 9 mL via INTRAVENOUS

## 2012-03-16 ENCOUNTER — Other Ambulatory Visit: Payer: Self-pay | Admitting: *Deleted

## 2012-03-16 MED ORDER — CLOPIDOGREL BISULFATE 75 MG PO TABS: 75.0000 mg | ORAL_TABLET | Freq: Every day | ORAL | Status: DC

## 2012-03-16 NOTE — Telephone Encounter (Signed)
Fax Received. Refill Completed. Felesia Stahlecker Chowoe (R.M.A)   

## 2012-03-18 ENCOUNTER — Other Ambulatory Visit: Payer: Self-pay | Admitting: *Deleted

## 2012-03-18 MED ORDER — CLOPIDOGREL BISULFATE 75 MG PO TABS: 75.0000 mg | ORAL_TABLET | Freq: Every day | ORAL | Status: DC

## 2012-07-01 ENCOUNTER — Other Ambulatory Visit (HOSPITAL_COMMUNITY): Payer: Self-pay | Admitting: Internal Medicine

## 2012-07-06 ENCOUNTER — Encounter (HOSPITAL_COMMUNITY): Payer: Self-pay

## 2012-07-06 ENCOUNTER — Ambulatory Visit (HOSPITAL_COMMUNITY)
Admission: RE | Admit: 2012-07-06 | Discharge: 2012-07-06 | Disposition: A | Discharge status: Home / Self Care | Payer: Medicare Other | Source: Ambulatory Visit | Attending: Internal Medicine | Admitting: Internal Medicine

## 2012-07-06 DIAGNOSIS — M810 Age-related osteoporosis without current pathological fracture: Secondary | ICD-10-CM | POA: Insufficient documentation

## 2012-07-06 MED ORDER — SODIUM CHLORIDE 0.9 % IV SOLN
Freq: Once | INTRAVENOUS | Status: AC
  Administered 2012-07-06: 11:00:00 via INTRAVENOUS

## 2012-07-06 MED ORDER — ZOLEDRONIC ACID 5 MG/100ML IV SOLN
5.0000 mg | Freq: Once | INTRAVENOUS | Status: AC
  Administered 2012-07-06: 5 mg via INTRAVENOUS
  Filled 2012-07-06: qty 100

## 2012-08-12 DIAGNOSIS — H26491 Other secondary cataract, right eye: Secondary | ICD-10-CM | POA: Insufficient documentation

## 2012-08-12 DIAGNOSIS — T8529XA Other mechanical complication of intraocular lens, initial encounter: Secondary | ICD-10-CM | POA: Insufficient documentation

## 2012-08-12 DIAGNOSIS — H2510 Age-related nuclear cataract, unspecified eye: Secondary | ICD-10-CM | POA: Insufficient documentation

## 2012-08-19 ENCOUNTER — Ambulatory Visit: Payer: Medicare Other | Admitting: Cardiovascular Disease

## 2012-08-26 ENCOUNTER — Other Ambulatory Visit: Payer: Self-pay | Admitting: *Deleted

## 2012-08-26 MED ORDER — ROSUVASTATIN CALCIUM 20 MG PO TABS: 20.0000 mg | ORAL_TABLET | Freq: Every day | ORAL | Status: DC

## 2012-08-26 NOTE — Telephone Encounter (Signed)
Fax Received. Refill Completed. Dejon Jungman Chowoe (R.M.A)   

## 2012-10-26 ENCOUNTER — Encounter: Payer: Self-pay | Admitting: Cardiovascular Disease

## 2012-10-26 ENCOUNTER — Ambulatory Visit (INDEPENDENT_AMBULATORY_CARE_PROVIDER_SITE_OTHER): Payer: Medicare Other | Admitting: Cardiovascular Disease

## 2012-10-26 VITALS — BP 100/70 | HR 73 | Ht 63.0 in | Wt 131.0 lb

## 2012-10-26 DIAGNOSIS — I2510 Atherosclerotic heart disease of native coronary artery without angina pectoris: Secondary | ICD-10-CM

## 2012-10-26 DIAGNOSIS — E785 Hyperlipidemia, unspecified: Secondary | ICD-10-CM

## 2012-10-26 NOTE — Progress Notes (Signed)
Susan [name] Date of Birth  03-Aug-1933 [facility]     [facility]ael [facility]o Office  [address]    [address]   [address] [place], [place]  [address]    [place], [place]  [address] [phone]  Fax  [phone]  [phone]  Fax [phone]  Problem list: 1. Coronary artery disease-status post PTCA and stenting of her right coronary artery. We placed a 2.75 x 16 mm Taxus stent deployed at 18 atmospheres. She also has a moderate proximal LAD stenosis. 2. Hypothyroidism 3. Hyperlipidemia-   History of Present Illness:   Susan [name] is a 79 yo with a hx of CAD.  She is still playing tennis regularly.  She has not had any angina.  She has some DOE.     October 26, 2012:  Susan [name] is doing well.  She is still playing lots of tennis.  No CP.  She has had a lifeline screening - her carotid arteries are normal.  She denies episodes of chest pain or shortness of breath.  Current Outpatient Prescriptions on File Prior to Visit  Medication Sig Dispense Refill  . aspirin 81 MG tablet Take 81 mg by mouth daily.        . Calcium Carbonate-Vitamin D (CALCIUM + D PO) Take by mouth 2 (two) times daily.        . clopidogrel (PLAVIX) 75 MG tablet Take 1 tablet (75 mg total) by mouth daily.  90 tablet  1  . fish oil-omega-3 fatty acids 1000 MG capsule Take by mouth daily.        Marland Kitchen levothyroxine (SYNTHROID, LEVOTHROID) 50 MCG tablet Take 50 mcg by mouth daily. Take 1 and 1/2 pill      . Multiple Vitamins-Minerals (PRESERVISION/LUTEIN PO) Take by mouth daily.       . nitroGLYCERIN (NITROSTAT) 0.4 MG SL tablet Place 1 tablet (0.4 mg total) under the tongue every 5 (five) minutes as needed.  25 tablet  3  . rosuvastatin (CRESTOR) 20 MG tablet Take 1 tablet (20 mg total) by mouth daily.  90 tablet  3  . venlafaxine (EFFEXOR-XR) 150 MG 24 hr capsule Take 150 mg by mouth daily.        . vitamin B-12 (CYANOCOBALAMIN) 1000 MCG tablet Take 1,000 mcg by mouth daily.        Marland Kitchen zolendronic acid (ZOMETA) 4  MG/5ML injection Inject 4 mg into the vein once.       No current facility-administered medications on file prior to visit.    No Known Allergies  Past Medical History  Diagnosis Date  . Coronary artery disease     POST PTCA AND STENTING OF HER RIGHT CORONARY ARTERY  . LAD stenosis     MODERATE 50-60% STENOSIS  . Hypothyroidism   . Osteoporosis     Past Surgical History  Procedure Laterality Date  . Knee surgery      arthroscopic  torn meniscus  . Lumbar laminectomy      years  ago  . Ovarian cyst rupture and appy    . Fractured wrist repair      History  Smoking status  . Never Smoker   Smokeless tobacco  . Not on file    History  Alcohol Use  . Yes    Comment: wine occasional    Family History  Problem Relation Age of Onset  . Alzheimer's disease Mother   . Heart attack Father   . Aneurysm Sister  Reviw of Systems:  Reviewed in the HPI.  All other systems are negative.  Physical Exam: Blood pressure 100/70, pulse 73, height 5\' 3"  (1.6 m), weight 131 lb (59.421 kg). General: Well developed, well nourished, in no acute distress.  Head: Normocephalic, atraumatic, sclera non-icteric, mucus membranes are moist,   Neck: Supple. Negative for carotid bruits. JVD not elevated.  Lungs: Clear bilaterally to auscultation without wheezes, rales, or rhonchi. Breathing is unlabored.  Heart: RRR with S1 S2. No murmurs, rubs, or gallops appreciated.  Abdomen: Soft, non-tender, non-distended with normoactive bowel sounds. No hepatomegaly. No rebound/guarding. No obvious abdominal masses.  Msk:  Strength and tone appear normal for age.  Extremities: No clubbing or cyanosis. No edema.  Distal pedal pulses are 2+ and equal bilaterally.  Neuro: Alert and oriented X 3. Moves all extremities spontaneously.  Psych:  Responds to questions appropriately with a normal affect.  ECG: 10/26/2012: EKG reveals normal sinus rhythm. She has an incomplete left bundle branch  block. The left bundle branch block is new from her previous tracing. Assessment / Plan:

## 2012-10-26 NOTE — Patient Instructions (Addendum)
Your physician recommends that you return for lab work in: 4 WEEKS LAB FOR LIPOMED PROFILE.  Your physician wants you to follow-up in: 6 MONTHS  You will receive a reminder letter in the mail two months in advance. If you don't receive a letter, please call our office to schedule the follow-up appointment.  Your physician recommends that you return for a FASTING lipid profile: 6 MONTHS FOR LIPOMED

## 2012-11-23 ENCOUNTER — Other Ambulatory Visit: Payer: Medicare Other

## 2012-11-23 DIAGNOSIS — I2510 Atherosclerotic heart disease of native coronary artery without angina pectoris: Secondary | ICD-10-CM

## 2012-11-23 DIAGNOSIS — E785 Hyperlipidemia, unspecified: Secondary | ICD-10-CM

## 2013-02-17 ENCOUNTER — Encounter: Payer: Self-pay | Admitting: Cardiovascular Disease

## 2013-04-19 ENCOUNTER — Other Ambulatory Visit: Payer: Self-pay | Admitting: Cardiovascular Disease

## 2013-07-12 ENCOUNTER — Other Ambulatory Visit (HOSPITAL_COMMUNITY): Payer: Self-pay | Admitting: Internal Medicine

## 2013-07-12 ENCOUNTER — Encounter (HOSPITAL_COMMUNITY)
Admission: RE | Admit: 2013-07-12 | Discharge: 2013-07-12 | Disposition: A | Discharge status: Home / Self Care | Payer: Medicare Other | Source: Ambulatory Visit | Attending: Internal Medicine | Admitting: Internal Medicine

## 2013-07-12 ENCOUNTER — Encounter (HOSPITAL_COMMUNITY): Payer: Self-pay

## 2013-07-12 DIAGNOSIS — M810 Age-related osteoporosis without current pathological fracture: Secondary | ICD-10-CM | POA: Insufficient documentation

## 2013-07-12 MED ORDER — ZOLEDRONIC ACID 5 MG/100ML IV SOLN: 5.0000 mg | Freq: Once | INTRAVENOUS | Status: DC

## 2013-07-12 MED ORDER — ZOLEDRONIC ACID 5 MG/100ML IV SOLN
5.0000 mg | Freq: Once | INTRAVENOUS | Status: AC
  Administered 2013-07-12: 5 mg via INTRAVENOUS
  Filled 2013-07-12: qty 100

## 2013-07-12 MED ORDER — SODIUM CHLORIDE 0.9 % IV SOLN
Freq: Once | INTRAVENOUS | Status: AC
  Administered 2013-07-12: 250 mL via INTRAVENOUS

## 2013-07-12 NOTE — Discharge Instructions (Signed)
Drink fluids/water as tolerated over next 72hrs °Tylenol or Ibuprofen OTC as directed °Continue calcium and Vit D as directed by your MDZoledronic Acid injection (Paget's Disease, Osteoporosis) °What is this medicine? °ZOLEDRONIC ACID (ZOE le dron ik AS id) lowers the amount of calcium loss from bone. It is used to treat Paget's disease and osteoporosis in women. °This medicine may be used for other purposes; ask your health care provider or pharmacist if you have questions. °COMMON BRAND NAME(S): Reclast, Zometa °What should I tell my health care provider before I take this medicine? °They need to know if you have any of these conditions: °-aspirin-sensitive asthma °-cancer, especially if you are receiving medicines used to treat cancer °-dental disease or wear dentures °-infection °-kidney disease °-low levels of calcium in the blood °-past surgery on the parathyroid gland or intestines °-receiving corticosteroids like dexamethasone or prednisone °-an unusual or allergic reaction to zoledronic acid, other medicines, foods, dyes, or preservatives °-pregnant or trying to get pregnant °-breast-feeding °How should I use this medicine? °This medicine is for infusion into a vein. It is given by a health care professional in a hospital or clinic setting. °Talk to your pediatrician regarding the use of this medicine in children. This medicine is not approved for use in children. °Overdosage: If you think you have taken too much of this medicine contact a poison control center or emergency room at once. °NOTE: This medicine is only for you. Do not share this medicine with others. °What if I miss a dose? °It is important not to miss your dose. Call your doctor or health care professional if you are unable to keep an appointment. °What may interact with this medicine? °-certain antibiotics given by injection °-NSAIDs, medicines for pain and inflammation, like ibuprofen or naproxen °-some diuretics like bumetanide,  furosemide °-teriparatide °This list may not describe all possible interactions. Give your health care provider a list of all the medicines, herbs, non-prescription drugs, or dietary supplements you use. Also tell them if you smoke, drink alcohol, or use illegal drugs. Some items may interact with your medicine. °What should I watch for while using this medicine? °Visit your doctor or health care professional for regular checkups. It may be some time before you see the benefit from this medicine. Do not stop taking your medicine unless your doctor tells you to. Your doctor may order blood tests or other tests to see how you are doing. °Women should inform their doctor if they wish to become pregnant or think they might be pregnant. There is a potential for serious side effects to an unborn child. Talk to your health care professional or pharmacist for more information. °You should make sure that you get enough calcium and vitamin D while you are taking this medicine. Discuss the foods you eat and the vitamins you take with your health care professional. °Some people who take this medicine have severe bone, joint, and/or muscle pain. This medicine may also increase your risk for jaw problems or a broken thigh bone. Tell your doctor right away if you have severe pain in your jaw, bones, joints, or muscles. Tell your doctor if you have any pain that does not go away or that gets worse. °Tell your dentist and dental surgeon that you are taking this medicine. You should not have major dental surgery while on this medicine. See your dentist to have a dental exam and fix any dental problems before starting this medicine. Take good care of your teeth while on   this medicine. Make sure you see your dentist for regular follow-up appointments. °What side effects may I notice from receiving this medicine? °Side effects that you should report to your doctor or health care professional as soon as possible: °-allergic reactions  like skin rash, itching or hives, swelling of the face, lips, or tongue °-anxiety, confusion, or depression °-breathing problems °-changes in vision °-eye pain °-feeling faint or lightheaded, falls °-jaw pain, especially after dental work °-mouth sores °-muscle cramps, stiffness, or weakness °-trouble passing urine or change in the amount of urine °Side effects that usually do not require medical attention (report to your doctor or health care professional if they continue or are bothersome): °-bone, joint, or muscle pain °-constipation °-diarrhea °-fever °-hair loss °-irritation at site where injected °-loss of appetite °-nausea, vomiting °-stomach upset °-trouble sleeping °-trouble swallowing °-weak or tired °This list may not describe all possible side effects. Call your doctor for medical advice about side effects. You may report side effects to FDA at 1-800-FDA-1088. °Where should I keep my medicine? °This drug is given in a hospital or clinic and will not be stored at home. °NOTE: This sheet is a summary. It may not cover all possible information. If you have questions about this medicine, talk to your doctor, pharmacist, or health care provider. °© 2014, [employer]vier/Gold Standard. (2012-10-03 10:03:48) ° °

## 2013-08-08 ENCOUNTER — Other Ambulatory Visit: Payer: Self-pay | Admitting: Cardiovascular Disease

## 2013-08-30 ENCOUNTER — Other Ambulatory Visit: Payer: Self-pay | Admitting: Cardiovascular Disease

## 2013-09-01 ENCOUNTER — Other Ambulatory Visit: Payer: Self-pay

## 2013-09-01 MED ORDER — CLOPIDOGREL BISULFATE 75 MG PO TABS: ORAL_TABLET | ORAL | Status: DC

## 2013-09-14 ENCOUNTER — Other Ambulatory Visit: Payer: Self-pay | Admitting: Cardiovascular Disease

## 2013-10-11 ENCOUNTER — Ambulatory Visit (INDEPENDENT_AMBULATORY_CARE_PROVIDER_SITE_OTHER): Payer: Medicare Other | Admitting: Cardiovascular Disease

## 2013-10-11 ENCOUNTER — Encounter: Payer: Self-pay | Admitting: Cardiovascular Disease

## 2013-10-11 VITALS — BP 130/82 | HR 109 | Ht 63.5 in | Wt 131.0 lb

## 2013-10-11 DIAGNOSIS — F419 Anxiety disorder, unspecified: Secondary | ICD-10-CM

## 2013-10-11 DIAGNOSIS — I2510 Atherosclerotic heart disease of native coronary artery without angina pectoris: Secondary | ICD-10-CM

## 2013-10-11 DIAGNOSIS — E039 Hypothyroidism, unspecified: Secondary | ICD-10-CM

## 2013-10-11 DIAGNOSIS — F411 Generalized anxiety disorder: Secondary | ICD-10-CM

## 2013-10-11 MED ORDER — CLOPIDOGREL BISULFATE 75 MG PO TABS: ORAL_TABLET | ORAL | Status: DC

## 2013-10-11 MED ORDER — METOPROLOL TARTRATE 25 MG PO TABS: 25.0000 mg | ORAL_TABLET | Freq: Two times a day (BID) | ORAL | Status: DC

## 2013-10-11 MED ORDER — VENLAFAXINE HCL ER 150 MG PO CP24: 150.0000 mg | ORAL_CAPSULE | Freq: Every day | ORAL | Status: DC

## 2013-10-11 NOTE — Assessment & Plan Note (Signed)
She seems to be very stable. She's not had any episodes of chest pain or shortness of breath.

## 2013-10-11 NOTE — Patient Instructions (Addendum)
Your physician recommends that you have lipid profile: [place] has recommended you make the following change in your medication:  1. START METOPROLOL TART 25 MG 1 TAB TWICE DAILY  Your physician recommends that you schedule a follow-up appointment in: 2 -3 MONTHS WITH DR. Acie Fredrickson

## 2013-10-11 NOTE — Assessment & Plan Note (Addendum)
She presents today with lots of anxiety issues.   One of her friends died recently. She was diagnosed as having some some damage on her face. She ran out of her medications because she could not get back in to see me. All of these issues cause her to be quite anxious.   She feels like she is going to "crawl out of her skin". Her heart rate is very fast. She is in sinus rhythm but she does have some PACs.  We will check a TSH. I've given her some metoprolol 25 mg twice a day to help slow her heart rate down. I'll see her again in several months for followup visit and we'll reassess things.  She is quite anxious about not being able to get in to see me in a timely manner. She ran out of her medications for time before they were refilled.I have asked her to call Dr. Silvestre Mesi office for further recs.  Addendum: TSH is supressed Lab Results  Component Value Date   TSH 0.25* 10/11/2013   Will have her decrease her dose to 50 mcg a day  ( from 75 mcg a day with none on Sundays) and call Dr. Silvestre Mesi office .   I have called patient and explained. This.

## 2013-10-11 NOTE — Progress Notes (Signed)
Susan [name] Date of Birth  12/29/1933 [place] [address]    [place]   [place], [place]  [address]    [place], [place]  [address] [phone]  Fax  [phone]  [phone]  Fax [phone]  Problem list: 1. Coronary artery disease-status post PTCA and stenting of her right coronary artery. We placed a 2.75 x 16 mm Taxus stent deployed at 18 atmospheres. She also has a moderate proximal LAD stenosis. 2. Hypothyroidism 3. Hyperlipidemia-   History of Present Illness:   Susan [name] is a 80 yo with a hx of CAD.  She is still playing tennis regularly.  She has not had any angina.  She has some DOE.     October 26, 2012:  Susan [name] is doing well.  She is still playing lots of tennis.  No CP.  She has had a lifeline screening - her carotid arteries are normal.  She denies episodes of chest pain or shortness of breath.  October 11, 2013:  Susan [name] is not feeling well today. "Feels like she is jumping out of her skin" Got back from a trip , she found out that a friend had died.   She ha  Current Outpatient Prescriptions on File Prior to Visit  Medication Sig Dispense Refill  . Calcium Carbonate-Vitamin D (CALCIUM + D PO) Take by mouth 2 (two) times daily.        . clopidogrel (PLAVIX) 75 MG tablet TAKE ONE TABLET BY MOUTH ONCE DAILY-NEEDS TO BE SEEN  30 tablet  1  . CRESTOR 20 MG tablet TAKE ONE TABLET BY MOUTH ONCE DAILY  90 tablet  0  . fish oil-omega-3 fatty acids 1000 MG capsule Take by mouth daily.        Marland Kitchen levothyroxine (SYNTHROID, LEVOTHROID) 50 MCG tablet Take 50 mcg by mouth daily. Take 1 and 1/2 pill      . nitroGLYCERIN (NITROSTAT) 0.4 MG SL tablet Place 1 tablet (0.4 mg total) under the tongue every 5 (five) minutes as needed.  25 tablet  3  . venlafaxine (EFFEXOR-XR) 150 MG 24 hr capsule Take 150 mg by mouth daily.        . vitamin B-12 (CYANOCOBALAMIN) 1000 MCG tablet Take 1,000 mcg by mouth daily.        Marland Kitchen  zolendronic acid (ZOMETA) 4 MG/5ML injection Inject 4 mg into the vein once.       No current facility-administered medications on file prior to visit.    No Known Allergies  Past Medical History  Diagnosis Date  . Coronary artery disease     POST PTCA AND STENTING OF HER RIGHT CORONARY ARTERY  . LAD stenosis     MODERATE 50-60% STENOSIS  . Hypothyroidism   . Osteoporosis     Past Surgical History  Procedure Laterality Date  . Knee surgery      arthroscopic  torn meniscus  . Lumbar laminectomy      years  ago  . Ovarian cyst rupture and appy    . Fractured wrist repair      History  Smoking status  . Never Smoker   Smokeless tobacco  . Not on file    History  Alcohol Use  . Yes    Comment: wine occasional    Family History  Problem Relation Age of Onset  . Alzheimer's disease Mother   . Heart attack Father   . Aneurysm Sister  Reviw of Systems:  Reviewed in the HPI.  All other systems are negative.  Physical Exam: Blood pressure 130/82, pulse 109, height 5' 3.5" (1.613 m), weight 131 lb (59.421 kg). General: Well developed, well nourished, in no acute distress.  Head: Normocephalic, atraumatic, sclera non-icteric, mucus membranes are moist,   Neck: Supple. Negative for carotid bruits. JVD not elevated.  Lungs: Clear bilaterally to auscultation without wheezes, rales, or rhonchi. Breathing is unlabored.  Heart: Irreg. Irreg.  No murmurs, rubs, or gallops appreciated.  Abdomen: Soft, non-tender, non-distended with normoactive bowel sounds. No hepatomegaly. No rebound/guarding. No obvious abdominal masses.  Msk:  Strength and tone appear normal for age.  Extremities: No clubbing or cyanosis. No edema.  Distal pedal pulses are 2+ and equal bilaterally.  Neuro: Alert and oriented X 3. Moves all extremities spontaneously.  Psych:  She is very anxious today, crying. .  ECG: 10/11/2013: Sinus tachycardia with occasional PACs. Her heart rate is 109.  She is a left bundle branch block. Assessment / Plan:

## 2013-10-12 LAB — LIPID PANEL
CHOLESTEROL: 177 mg/dL (ref 0–200)
HDL: 50.3 mg/dL (ref >39.00)
LDL CALC: 71 mg/dL (ref 0–99)
NonHDL: 126.7
Total CHOL/HDL Ratio: 4
Triglycerides: 277 mg/dL — ABNORMAL HIGH (ref 0.0–149.0)
VLDL: 55.4 mg/dL — ABNORMAL HIGH (ref 0.0–40.0)

## 2013-10-12 LAB — HEPATIC FUNCTION PANEL
ALK PHOS: 52 U/L (ref 39–117)
ALT: 15 U/L (ref 0–35)
AST: 20 U/L (ref 0–37)
Albumin: 3.9 g/dL (ref 3.5–5.2)
BILIRUBIN DIRECT: 0 mg/dL (ref 0.0–0.3)
BILIRUBIN TOTAL: 0.7 mg/dL (ref 0.2–1.2)
Total Protein: 7.3 g/dL (ref 6.0–8.3)

## 2013-10-12 LAB — BASIC METABOLIC PANEL
BUN: 15 mg/dL (ref 6–23)
CALCIUM: 9.7 mg/dL (ref 8.4–10.5)
CO2: 25 mEq/L (ref 19–32)
CREATININE: 0.9 mg/dL (ref 0.4–1.2)
Chloride: 107 mEq/L (ref 96–112)
GFR: 64.39 mL/min (ref >60.00)
Glucose, Bld: 85 mg/dL (ref 70–99)
Potassium: 4 mEq/L (ref 3.5–5.1)
Sodium: 140 mEq/L (ref 135–145)

## 2013-10-12 LAB — TSH: TSH: 0.25 u[IU]/mL — ABNORMAL LOW (ref 0.35–4.50)

## 2013-10-12 MED ORDER — LEVOTHYROXINE SODIUM 50 MCG PO TABS: 50.0000 ug | ORAL_TABLET | Freq: Every day | ORAL | Status: DC

## 2013-10-12 NOTE — Addendum Note (Signed)
Addended by: Thayer Headings on: 10/12/2013 05:35 PM   Modules accepted: Orders, Medications

## 2013-10-25 DIAGNOSIS — Z961 Presence of intraocular lens: Secondary | ICD-10-CM | POA: Insufficient documentation

## 2013-12-15 ENCOUNTER — Ambulatory Visit: Payer: Medicare Other | Admitting: Cardiovascular Disease

## 2013-12-18 ENCOUNTER — Other Ambulatory Visit: Payer: Self-pay | Admitting: Cardiovascular Disease

## 2013-12-18 ENCOUNTER — Ambulatory Visit: Payer: Medicare Other | Admitting: Cardiovascular Disease

## 2013-12-22 ENCOUNTER — Ambulatory Visit (INDEPENDENT_AMBULATORY_CARE_PROVIDER_SITE_OTHER): Payer: Medicare Other | Admitting: Cardiovascular Disease

## 2013-12-22 ENCOUNTER — Encounter: Payer: Self-pay | Admitting: Cardiovascular Disease

## 2013-12-22 VITALS — BP 110/80 | HR 80 | Ht 63.5 in | Wt 132.1 lb

## 2013-12-22 DIAGNOSIS — I2510 Atherosclerotic heart disease of native coronary artery without angina pectoris: Secondary | ICD-10-CM

## 2013-12-22 MED ORDER — CLOPIDOGREL BISULFATE 75 MG PO TABS: ORAL_TABLET | ORAL | Status: DC

## 2013-12-22 MED ORDER — ROSUVASTATIN CALCIUM 20 MG PO TABS: 20.0000 mg | ORAL_TABLET | Freq: Every day | ORAL | Status: DC

## 2013-12-22 NOTE — Patient Instructions (Signed)
Your physician recommends that you continue on your current medications as directed. Please refer to the Current Medication list given to you today.  Your physician wants you to follow-up in: 6 months with Dr. Nahser.  You will receive a reminder letter in the mail two months in advance. If you don't receive a letter, please call our office to schedule the follow-up appointment.  

## 2013-12-22 NOTE — Progress Notes (Signed)
Susan [name] Date of Birth  29-Oct-1933 [place] [address]    [place]   [place], [place]  [address]    [place], [place]  [address] [phone]  Fax  [phone]  [phone]  Fax [phone]  Problem list: 1. Coronary artery disease-status post PTCA and stenting of her right coronary artery. We placed a 2.75 x 16 mm Taxus stent deployed at 18 atmospheres. She also has a moderate proximal LAD stenosis. 2. Hypothyroidism 3. Hyperlipidemia-   History of Present Illness:   Susan [name] is a 80 yo with a hx of CAD.  She is still playing tennis regularly.  She has not had any angina.  She has some DOE.     October 26, 2012:  Susan [name] is doing well.  She is still playing lots of tennis.  No CP.  She has had a lifeline screening - her carotid arteries are normal.  She denies episodes of chest pain or shortness of breath.  October 11, 2013:  Susan [name] is not feeling well today. "Feels like she is jumping out of her skin" Got back from a trip , she found out that a friend had died.   01-07-14:  Susan [name] is doing ok.  She has had some eye problems.  Her eyesite is ok.   No angina.   She does have some mild dyspnea if she climbs 3-4 flights of stairs.  Still playing tennis regularly.    Current Outpatient Prescriptions on File Prior to Visit  Medication Sig Dispense Refill  . Calcium Carbonate-Vitamin D (CALCIUM + D PO) Take by mouth 2 (two) times daily.        . clopidogrel (PLAVIX) 75 MG tablet TAKE ONE TABLET BY MOUTH ONCE DAILY-NEEDS TO BE SEEN  90 tablet  1  . CRESTOR 20 MG tablet TAKE ONE TABLET BY MOUTH ONCE DAILY  90 tablet  0  . fish oil-omega-3 fatty acids 1000 MG capsule Take by mouth daily.        Marland Kitchen levothyroxine (SYNTHROID) 50 MCG tablet Take 1 tablet (50 mcg total) by mouth daily before breakfast.  1 tablet    . nitroGLYCERIN (NITROSTAT) 0.4 MG SL tablet Place 1 tablet (0.4 mg total) under the tongue every 5  (five) minutes as needed.  25 tablet  3  . venlafaxine XR (EFFEXOR-XR) 150 MG 24 hr capsule Take 1 capsule (150 mg total) by mouth daily.  90 capsule  1  . vitamin B-12 (CYANOCOBALAMIN) 1000 MCG tablet Take 1,000 mcg by mouth daily.        Marland Kitchen zolendronic acid (ZOMETA) 4 MG/5ML injection Inject 4 mg into the vein once.       No current facility-administered medications on file prior to visit.    No Known Allergies  Past Medical History  Diagnosis Date  . Coronary artery disease     POST PTCA AND STENTING OF HER RIGHT CORONARY ARTERY  . LAD stenosis     MODERATE 50-60% STENOSIS  . Hypothyroidism   . Osteoporosis     Past Surgical History  Procedure Laterality Date  . Knee surgery      arthroscopic  torn meniscus  . Lumbar laminectomy      years  ago  . Ovarian cyst rupture and appy    . Fractured wrist repair      History  Smoking status  . Never Smoker   Smokeless tobacco  .  Not on file    History  Alcohol Use  . Yes    Comment: wine occasional    Family History  Problem Relation Age of Onset  . Alzheimer's disease Mother   . Heart attack Father   . Aneurysm Sister     Reviw of Systems:  Reviewed in the HPI.  All other systems are negative.  Physical Exam: Blood pressure 110/80, pulse 80, height 5' 3.5" (1.613 m), weight 132 lb 1.9 oz (59.929 kg). General: Well developed, well nourished, in no acute distress.  Head: Normocephalic, atraumatic, sclera non-icteric, mucus membranes are moist,   Neck: Supple. Negative for carotid bruits. JVD not elevated.  Lungs: Clear bilaterally to auscultation without wheezes, rales, or rhonchi. Breathing is unlabored.  Heart: Irreg. Irreg.  No murmurs, rubs, or gallops appreciated.  Abdomen: Soft, non-tender, non-distended with normoactive bowel sounds. No hepatomegaly. No rebound/guarding. No obvious abdominal masses.  Msk:  Strength and tone appear normal for age.  Extremities: No clubbing or cyanosis. No edema.   Distal pedal pulses are 2+ and equal bilaterally.  Neuro: Alert and oriented X 3. Moves all extremities spontaneously.  Psych:  She is very anxious today, crying. .  ECG: 10/11/2013: Sinus tachycardia with occasional PACs. Her heart rate is 109. She is a left bundle branch block. Assessment / Plan:

## 2013-12-22 NOTE — Assessment & Plan Note (Signed)
Part is feeling well. She's not having episodes of angina. She still active playing tennis on a regular basis. Continue with same medications.

## 2014-07-17 DIAGNOSIS — H353190 Nonexudative age-related macular degeneration, unspecified eye, stage unspecified: Secondary | ICD-10-CM | POA: Insufficient documentation

## 2014-07-17 DIAGNOSIS — Z961 Presence of intraocular lens: Secondary | ICD-10-CM | POA: Insufficient documentation

## 2014-07-18 ENCOUNTER — Encounter: Payer: Self-pay | Admitting: Cardiovascular Disease

## 2014-07-18 ENCOUNTER — Ambulatory Visit (INDEPENDENT_AMBULATORY_CARE_PROVIDER_SITE_OTHER): Payer: Medicare Other | Admitting: Cardiovascular Disease

## 2014-07-18 VITALS — BP 126/83 | HR 85 | Ht 63.5 in | Wt 130.8 lb

## 2014-07-18 DIAGNOSIS — I2510 Atherosclerotic heart disease of native coronary artery without angina pectoris: Secondary | ICD-10-CM | POA: Diagnosis not present

## 2014-07-18 DIAGNOSIS — E785 Hyperlipidemia, unspecified: Secondary | ICD-10-CM

## 2014-07-18 NOTE — Patient Instructions (Signed)
Your physician recommends that you continue on your current medications as directed. Please refer to the Current Medication list given to you today.  Your physician wants you to follow-up in: 1 year with Dr. Nahser.  You will receive a reminder letter in the mail two months in advance. If you don't receive a letter, please call our office to schedule the follow-up appointment.  

## 2014-07-18 NOTE — Progress Notes (Signed)
Cardiology Office Note   Date:  07/18/2014   ID:  Susan [name], Susan [name] [birth year], MRN [id number]  PCP:  Jerlyn Ly, MD  Cardiologist:   Thayer Headings, MD   Chief Complaint  Patient presents with  . Coronary Artery Disease   1. Coronary artery disease-status post PTCA and stenting of her right coronary artery. We placed a 2.75 x 16 mm Taxus stent deployed at 18 atmospheres. She also has a moderate proximal LAD stenosis. 2. Hypothyroidism 3. Hyperlipidemia-   History of Present Illness:  Susan [name] is a 81 yo with a hx of CAD. She is still playing tennis regularly. She has not had any angina. She has some DOE.   October 26, 2012:  Susan [name] is doing well. She is still playing lots of tennis. No CP. She has had a lifeline screening - her carotid arteries are normal. She denies episodes of chest pain or shortness of breath.  October 11, 2013:  Susan [name] is not feeling well today. "Feels like she is jumping out of her skin" Got back from a trip , she found out that a friend had died.   01/20/2014:  Susan [name] is doing ok. She has had some eye problems. Her eyesite is ok.  No angina. She does have some mild dyspnea if she climbs 3-4 flights of stairs. Still playing tennis regularly.    July 18, 2014:  Susan [name] is a 81 y.o. female who presents for follow up for her CAD. Just got back from a mission trip in [place]. Did lots of painting and cleaning. No angina or dyspnea.   Is not playing as much tennis - her tennis team has moved on.  2 of her partners have died , 1 is too unstable on her feet.    Past Medical History  Diagnosis Date  . Coronary artery disease     POST PTCA AND STENTING OF HER RIGHT CORONARY ARTERY  . LAD stenosis     MODERATE 50-60% STENOSIS  . Hypothyroidism   . Osteoporosis     Past Surgical History  Procedure Laterality Date  . Knee surgery      arthroscopic  torn meniscus  . Lumbar laminectomy      years  ago    . Ovarian cyst rupture and appy    . Fractured wrist repair       Current Outpatient Prescriptions  Medication Sig Dispense Refill  . Calcium Carbonate-Vitamin D (CALCIUM + D PO) Take by mouth 2 (two) times daily.      . clopidogrel (PLAVIX) 75 MG tablet TAKE ONE TABLET BY MOUTH ONCE DAILY 92 tablet 3  . fish oil-omega-3 fatty acids 1000 MG capsule Take by mouth daily.      Marland Kitchen levothyroxine (SYNTHROID) 50 MCG tablet Take 1 tablet (50 mcg total) by mouth daily before breakfast. 1 tablet   . nitroGLYCERIN (NITROSTAT) 0.4 MG SL tablet Place 1 tablet (0.4 mg total) under the tongue every 5 (five) minutes as needed. 25 tablet 3  . rosuvastatin (CRESTOR) 20 MG tablet Take 1 tablet (20 mg total) by mouth daily. 90 tablet 3  . venlafaxine XR (EFFEXOR-XR) 150 MG 24 hr capsule Take 1 capsule (150 mg total) by mouth daily. 90 capsule 1  . vitamin B-12 (CYANOCOBALAMIN) 1000 MCG tablet Take 1,000 mcg by mouth daily.      Marland Kitchen zolendronic acid (ZOMETA) 4 MG/5ML injection Inject 4 mg into the vein once.     No current  facility-administered medications for this visit.    Allergies:   Review of patient's allergies indicates no known allergies.    Social History:  The patient  reports that she has never smoked. She does not have any smokeless tobacco history on file. She reports that she drinks alcohol. She reports that she does not use illicit drugs.   Family History:  The patient's family history includes Alzheimer's disease in her mother; Aneurysm in her sister; Heart attack in her father.    ROS:  Please see the history of present illness.    Review of Systems: Constitutional:  denies fever, chills, diaphoresis, appetite change and fatigue.  HEENT: denies photophobia, eye pain, redness, hearing loss, ear pain, congestion, sore throat, rhinorrhea, sneezing, neck pain, neck stiffness and tinnitus.  Respiratory: denies SOB, DOE, cough, chest tightness, and wheezing.  Cardiovascular: denies chest  pain, palpitations and leg swelling.  Gastrointestinal: denies nausea, vomiting, abdominal pain, diarrhea, constipation, blood in stool.  Genitourinary: denies dysuria, urgency, frequency, hematuria, flank pain and difficulty urinating.  Musculoskeletal: denies  myalgias, back pain, joint swelling, arthralgias and gait problem.   Skin: denies pallor, rash and wound.  Neurological: denies dizziness, seizures, syncope, weakness, light-headedness, numbness and headaches.   Hematological: denies adenopathy, easy bruising, personal or family bleeding history.  Psychiatric/ Behavioral: denies suicidal ideation, mood changes, confusion, nervousness, sleep disturbance and agitation.       All other systems are reviewed and negative.    PHYSICAL EXAM: VS:  BP 126/83 mmHg  Pulse 85  Ht 5' 3.5" (1.613 m)  Wt 130 lb 12.8 oz (59.33 kg)  BMI 22.80 kg/m2 , BMI Body mass index is 22.8 kg/(m^2). GEN: Well nourished, well developed, in no acute distress HEENT: normal Neck: no JVD, carotid bruits, or masses Cardiac: RRR; no murmurs, rubs, or gallops,no edema  Respiratory:  clear to auscultation bilaterally, normal work of breathing GI: soft, nontender, nondistended, + BS MS: no deformity or atrophy Skin: warm and dry, no rash Neuro:  Strength and sensation are intact Psych: normal   EKG:  EKG is ordered today. The ekg ordered today demonstrates NSR at 85 with PVCs, LBBB. No changes from previous tracing    Recent Labs: 10/11/2013: ALT 15; BUN 15; Creatinine 0.9; Potassium 4.0; Sodium 140; TSH 0.25*    Lipid Panel    Component Value Date/Time   CHOL 177 10/11/2013 1618   TRIG 277.0* 10/11/2013 1618   HDL 50.30 10/11/2013 1618   CHOLHDL 4 10/11/2013 1618   VLDL 55.4* 10/11/2013 1618   LDLCALC 71 10/11/2013 1618      Wt Readings from Last 3 Encounters:  07/18/14 130 lb 12.8 oz (59.33 kg)  12/22/13 132 lb 1.9 oz (59.929 kg)  10/11/13 131 lb (59.421 kg)      Other studies  Reviewed: Additional studies/ records that were reviewed today include: . Review of the above records demonstrates:    ASSESSMENT AND PLAN:  1. Coronary artery disease-status post PTCA and stenting of her right coronary artery. We placed a 2.75 x 16 mm Taxus stent deployed at 18 atmospheres. She also has a moderate proximal LAD stenosis. She remains very active.  No angina.    2. Hypothyroidism-  followed by Dr. Joylene Draft.  3. Hyperlipidemia- her lipid levels of been stable. Her most recent levels were at Dr. Silvestre Mesi office. Continue current medications.   Current medicines are reviewed at length with the patient today.  The patient does not have concerns regarding medicines.  The following changes  have been made:  no change  Labs/ tests ordered today include:  No orders of the defined types were placed in this encounter.     Disposition:   FU with me in 1 year    Signed, Cherrie Franca, Wonda Cheng, MD  07/18/2014 3:41 PM    [facility] [address], [place], [place]  [address] Phone: [phone]; Fax: [phone]

## 2014-07-19 ENCOUNTER — Encounter: Payer: Self-pay | Admitting: Cardiovascular Disease

## 2014-07-31 ENCOUNTER — Other Ambulatory Visit (HOSPITAL_COMMUNITY): Payer: Self-pay | Admitting: Internal Medicine

## 2014-08-02 ENCOUNTER — Encounter (HOSPITAL_COMMUNITY): Payer: Self-pay

## 2014-08-02 ENCOUNTER — Ambulatory Visit (HOSPITAL_COMMUNITY)
Admission: RE | Admit: 2014-08-02 | Discharge: 2014-08-02 | Disposition: A | Discharge status: Home / Self Care | Payer: Medicare Other | Source: Ambulatory Visit | Attending: Internal Medicine | Admitting: Internal Medicine

## 2014-08-02 DIAGNOSIS — M810 Age-related osteoporosis without current pathological fracture: Secondary | ICD-10-CM | POA: Insufficient documentation

## 2014-08-02 MED ORDER — SODIUM CHLORIDE 0.9 % IV SOLN
INTRAVENOUS | Status: AC
  Administered 2014-08-02: 15:00:00 via INTRAVENOUS

## 2014-08-02 MED ORDER — ZOLEDRONIC ACID 5 MG/100ML IV SOLN
5.0000 mg | Freq: Once | INTRAVENOUS | Status: AC
  Administered 2014-08-02: 5 mg via INTRAVENOUS
  Filled 2014-08-02: qty 100

## 2014-08-02 NOTE — Discharge Instructions (Signed)

## 2014-08-19 ENCOUNTER — Other Ambulatory Visit: Payer: Self-pay | Admitting: Cardiovascular Disease

## 2014-10-25 NOTE — Addendum Note (Signed)
Addended by: Stephannie Peters on: 10/25/2014 03:14 PM   Modules accepted: Orders

## 2015-01-05 ENCOUNTER — Other Ambulatory Visit: Payer: Self-pay | Admitting: Cardiovascular Disease

## 2015-03-22 ENCOUNTER — Other Ambulatory Visit: Payer: Self-pay | Admitting: Cardiovascular Disease

## 2015-04-11 ENCOUNTER — Telehealth: Payer: Self-pay | Admitting: *Deleted

## 2015-04-11 ENCOUNTER — Other Ambulatory Visit: Payer: Self-pay | Admitting: *Deleted

## 2015-04-11 MED ORDER — ROSUVASTATIN CALCIUM 20 MG PO TABS: 20.0000 mg | ORAL_TABLET | Freq: Every day | ORAL | Status: DC

## 2015-04-11 NOTE — Telephone Encounter (Signed)
Left message on voicemail asking for Generic of Crestor for cost reasons. Spoke with Dr. Elmarie Shiley Nurse Selinda Eon about patient wanting to switch to generic if it is ok per Dr. Acie Fredrickson. He is ok with the switch as long as [facility] has Rosuvastatin to fill rx.  I spoke with the pharmacy staff at [facility] on [address], they will have to order Rosuvastatin and will have it ready for her by the end of the weekend.  Notified patient about rx. She was enough for 5 days and is very thankful for the return call and verification of her rx.

## 2015-07-25 ENCOUNTER — Encounter: Payer: Self-pay | Admitting: Cardiovascular Disease

## 2015-07-25 ENCOUNTER — Ambulatory Visit (INDEPENDENT_AMBULATORY_CARE_PROVIDER_SITE_OTHER): Payer: Medicare Other | Admitting: Cardiovascular Disease

## 2015-07-25 VITALS — BP 118/74 | HR 67 | Ht 63.5 in | Wt 134.0 lb

## 2015-07-25 DIAGNOSIS — E785 Hyperlipidemia, unspecified: Secondary | ICD-10-CM | POA: Diagnosis not present

## 2015-07-25 DIAGNOSIS — I2510 Atherosclerotic heart disease of native coronary artery without angina pectoris: Secondary | ICD-10-CM

## 2015-07-25 MED ORDER — ROSUVASTATIN CALCIUM 40 MG PO TABS: 40.0000 mg | ORAL_TABLET | Freq: Every day | ORAL | Status: DC

## 2015-07-25 NOTE — Patient Instructions (Signed)

## 2015-07-25 NOTE — Progress Notes (Signed)
Cardiology Office Note   Date:  07/25/2015   ID:  Susan [name], Susan [name] 14-Jan-1934, MRN [id number]  PCP:  Jerlyn Ly, MD  Cardiologist:   Thayer Headings, MD   Chief Complaint  Patient presents with  . Follow-up  . Coronary Artery Disease   1. Coronary artery disease-status post PTCA and stenting of her right coronary artery. We placed a 2.75 x 16 mm Taxus stent deployed at 18 atmospheres. She also has a moderate proximal LAD stenosis. 2. Hypothyroidism 3. Hyperlipidemia-   History of Present Illness:  Susan [name] is a 82 yo with a hx of CAD. She is still playing tennis regularly. She has not had any angina. She has some DOE.   October 26, 2012:  Susan [name] is doing well. She is still playing lots of tennis. No CP. She has had a lifeline screening - her carotid arteries are normal. She denies episodes of chest pain or shortness of breath.  October 11, 2013:  Susan [name] is not feeling well today. "Feels like she is jumping out of her skin" Got back from a trip , she found out that a friend had died.   01/03/2014:  Susan [name] is doing ok. She has had some eye problems. Her eyesite is ok.  No angina. She does have some mild dyspnea if she climbs 3-4 flights of stairs. Still playing tennis regularly.    July 18, 2014:  Susan [name] is a 82 y.o. female who presents for follow up for her CAD. Just got back from a mission trip in [place]. Did lots of painting and cleaning. No angina or dyspnea.   Is not playing as much tennis - her tennis team has moved on.  2 of her partners have died , 1 is too unstable on her feet.   July 25, 2015:  Doing well.  Very active in her garden .  Plays tennis regularly . Plays on soft courts because of her back and knee pain .   Past Medical History  Diagnosis Date  . Coronary artery disease     POST PTCA AND STENTING OF HER RIGHT CORONARY ARTERY  . LAD stenosis     MODERATE 50-60% STENOSIS  . Hypothyroidism   .  Osteoporosis     Past Surgical History  Procedure Laterality Date  . Knee surgery      arthroscopic  torn meniscus  . Lumbar laminectomy      years  ago  . Ovarian cyst rupture and appy    . Fractured wrist repair       Current Outpatient Prescriptions  Medication Sig Dispense Refill  . Calcium Carbonate-Vitamin D (CALCIUM + D PO) Take by mouth 2 (two) times daily.      . clopidogrel (PLAVIX) 75 MG tablet TAKE ONE TABLET BY MOUTH ONCE DAILY. 90 tablet 2  . fish oil-omega-3 fatty acids 1000 MG capsule Take by mouth daily.      Marland Kitchen levothyroxine (SYNTHROID) 50 MCG tablet Take 1 tablet (50 mcg total) by mouth daily before breakfast. 1 tablet   . nitroGLYCERIN (NITROSTAT) 0.4 MG SL tablet Place 1 tablet (0.4 mg total) under the tongue every 5 (five) minutes as needed. 25 tablet 3  . rosuvastatin (CRESTOR) 20 MG tablet Take 1 tablet (20 mg total) by mouth daily. (Patient taking differently: Take 40 mg by mouth daily. ) 90 tablet 1  . venlafaxine XR (EFFEXOR-XR) 150 MG 24 hr capsule TAKE ONE CAPSULE BY MOUTH ONCE DAILY 90  capsule 3  . vitamin B-12 (CYANOCOBALAMIN) 1000 MCG tablet Take 1,000 mcg by mouth daily.      . zoledronic acid (RECLAST) 5 MG/100ML SOLN injection Inject 5 mg into the vein once.     No current facility-administered medications for this visit.    Allergies:   Review of patient's allergies indicates no known allergies.    Social History:  The patient  reports that she has never smoked. She does not have any smokeless tobacco history on file. She reports that she drinks alcohol. She reports that she does not use illicit drugs.   Family History:  The patient's family history includes Alzheimer's disease in her mother; Aneurysm in her sister; Heart attack in her father.    ROS:  Please see the history of present illness.    Review of Systems: Constitutional:  denies fever, chills, diaphoresis, appetite change and fatigue.  HEENT: denies photophobia, eye pain,  redness, hearing loss, ear pain, congestion, sore throat, rhinorrhea, sneezing, neck pain, neck stiffness and tinnitus.  Respiratory: denies SOB, DOE, cough, chest tightness, and wheezing.  Cardiovascular: denies chest pain, palpitations and leg swelling.  Gastrointestinal: denies nausea, vomiting, abdominal pain, diarrhea, constipation, blood in stool.  Genitourinary: denies dysuria, urgency, frequency, hematuria, flank pain and difficulty urinating.  Musculoskeletal: denies  myalgias, back pain, joint swelling, arthralgias and gait problem.   Skin: denies pallor, rash and wound.  Neurological: denies dizziness, seizures, syncope, weakness, light-headedness, numbness and headaches.   Hematological: denies adenopathy, easy bruising, personal or family bleeding history.  Psychiatric/ Behavioral: denies suicidal ideation, mood changes, confusion, nervousness, sleep disturbance and agitation.       All other systems are reviewed and negative.    PHYSICAL EXAM: VS:  BP 118/74 mmHg  Pulse 67  Ht 5' 3.5" (1.613 m)  Wt 134 lb (60.782 kg)  BMI 23.36 kg/m2  SpO2 98% , BMI Body mass index is 23.36 kg/(m^2). GEN: Well nourished, well developed, in no acute distress HEENT: normal Neck: no JVD, carotid bruits, or masses Cardiac: RRR; no murmurs, rubs, or gallops,no edema  Respiratory:  clear to auscultation bilaterally, normal work of breathing GI: soft, nontender, nondistended, + BS MS: no deformity or atrophy Skin: warm and dry, no rash Neuro:  Strength and sensation are intact Psych: normal   EKG:  EKG is ordered today. The ekg ordered today demonstrates NSR at 67   LBBB. The PVCs have resolved otherwise No changes from previous tracing    Recent Labs: No results found for requested labs within last 365 days.    Lipid Panel    Component Value Date/Time   CHOL 177 10/11/2013 1618   TRIG 277.0* 10/11/2013 1618   HDL 50.30 10/11/2013 1618   CHOLHDL 4 10/11/2013 1618   VLDL  55.4* 10/11/2013 1618   LDLCALC 71 10/11/2013 1618      Wt Readings from Last 3 Encounters:  07/25/15 134 lb (60.782 kg)  07/18/14 130 lb 12.8 oz (59.33 kg)  12/22/13 132 lb 1.9 oz (59.929 kg)      Other studies Reviewed: Additional studies/ records that were reviewed today include: . Review of the above records demonstrates:    ASSESSMENT AND PLAN:  1. Coronary artery disease-status post PTCA and stenting of her right coronary artery. We placed a 2.75 x 16 mm Taxus stent deployed at 18 atmospheres. She also has a moderate proximal LAD stenosis. She remains very active.  Working out in her garden .   No angina.   Continue  current meds.   2. Hypothyroidism-  followed by Dr. Joylene Draft.  3. Hyperlipidemia- crestor was increased to 40 mg a day  By Dr. Joylene Draft.   Will recheck labs at his office.   Current medicines are reviewed at length with the patient today.  The patient does not have concerns regarding medicines.  The following changes have been made:  no change  Labs/ tests ordered today include:  No orders of the defined types were placed in this encounter.     Disposition:   FU with me in 1 year    Signed, Dagmar Adcox, Wonda Cheng, MD  07/25/2015 9:46 AM    [facility] [address], [place], [place]  [address] Phone: [phone]; Fax: [phone]

## 2015-07-26 DIAGNOSIS — L244 Irritant contact dermatitis due to drugs in contact with skin: Secondary | ICD-10-CM | POA: Diagnosis not present

## 2015-07-26 DIAGNOSIS — L570 Actinic keratosis: Secondary | ICD-10-CM | POA: Diagnosis not present

## 2015-07-26 DIAGNOSIS — Z85828 Personal history of other malignant neoplasm of skin: Secondary | ICD-10-CM | POA: Diagnosis not present

## 2015-09-10 DIAGNOSIS — H353130 Nonexudative age-related macular degeneration, bilateral, stage unspecified: Secondary | ICD-10-CM | POA: Diagnosis not present

## 2015-09-10 DIAGNOSIS — Z961 Presence of intraocular lens: Secondary | ICD-10-CM | POA: Diagnosis not present

## 2015-09-10 DIAGNOSIS — H26492 Other secondary cataract, left eye: Secondary | ICD-10-CM | POA: Diagnosis not present

## 2015-09-20 DIAGNOSIS — E784 Other hyperlipidemia: Secondary | ICD-10-CM | POA: Diagnosis not present

## 2015-09-30 ENCOUNTER — Other Ambulatory Visit: Payer: Self-pay | Admitting: Cardiovascular Disease

## 2015-12-24 DIAGNOSIS — E559 Vitamin D deficiency, unspecified: Secondary | ICD-10-CM | POA: Diagnosis not present

## 2015-12-24 DIAGNOSIS — M810 Age-related osteoporosis without current pathological fracture: Secondary | ICD-10-CM | POA: Diagnosis not present

## 2015-12-31 DIAGNOSIS — E559 Vitamin D deficiency, unspecified: Secondary | ICD-10-CM | POA: Diagnosis not present

## 2015-12-31 DIAGNOSIS — M810 Age-related osteoporosis without current pathological fracture: Secondary | ICD-10-CM | POA: Diagnosis not present

## 2015-12-31 DIAGNOSIS — Z6824 Body mass index (BMI) 24.0-24.9, adult: Secondary | ICD-10-CM | POA: Diagnosis not present

## 2015-12-31 DIAGNOSIS — Z79899 Other long term (current) drug therapy: Secondary | ICD-10-CM | POA: Diagnosis not present

## 2016-01-07 ENCOUNTER — Other Ambulatory Visit: Payer: Self-pay | Admitting: Cardiovascular Disease

## 2016-01-09 ENCOUNTER — Ambulatory Visit (HOSPITAL_COMMUNITY)
Admission: RE | Admit: 2016-01-09 | Discharge: 2016-01-09 | Disposition: A | Discharge status: Home / Self Care | Payer: Medicare Other | Source: Ambulatory Visit | Attending: Internal Medicine | Admitting: Internal Medicine

## 2016-01-09 ENCOUNTER — Other Ambulatory Visit (HOSPITAL_COMMUNITY): Payer: Self-pay | Admitting: Internal Medicine

## 2016-01-09 ENCOUNTER — Encounter (HOSPITAL_COMMUNITY): Payer: Self-pay

## 2016-01-09 DIAGNOSIS — M810 Age-related osteoporosis without current pathological fracture: Secondary | ICD-10-CM | POA: Insufficient documentation

## 2016-01-09 MED ORDER — ZOLEDRONIC ACID 5 MG/100ML IV SOLN
5.0000 mg | Freq: Once | INTRAVENOUS | Status: AC
  Administered 2016-01-09: 5 mg via INTRAVENOUS
  Filled 2016-01-09: qty 100

## 2016-01-09 MED ORDER — SODIUM CHLORIDE 0.9 % IV SOLN
Freq: Once | INTRAVENOUS | Status: AC
  Administered 2016-01-09: 09:00:00 via INTRAVENOUS

## 2016-02-20 DIAGNOSIS — Z1231 Encounter for screening mammogram for malignant neoplasm of breast: Secondary | ICD-10-CM | POA: Diagnosis not present

## 2016-03-04 DIAGNOSIS — D225 Melanocytic nevi of trunk: Secondary | ICD-10-CM | POA: Diagnosis not present

## 2016-03-04 DIAGNOSIS — L821 Other seborrheic keratosis: Secondary | ICD-10-CM | POA: Diagnosis not present

## 2016-03-04 DIAGNOSIS — C44612 Basal cell carcinoma of skin of right upper limb, including shoulder: Secondary | ICD-10-CM | POA: Diagnosis not present

## 2016-03-04 DIAGNOSIS — Z85828 Personal history of other malignant neoplasm of skin: Secondary | ICD-10-CM | POA: Diagnosis not present

## 2016-03-04 DIAGNOSIS — L570 Actinic keratosis: Secondary | ICD-10-CM | POA: Diagnosis not present

## 2016-03-04 DIAGNOSIS — L820 Inflamed seborrheic keratosis: Secondary | ICD-10-CM | POA: Diagnosis not present

## 2016-03-04 DIAGNOSIS — D0461 Carcinoma in situ of skin of right upper limb, including shoulder: Secondary | ICD-10-CM | POA: Diagnosis not present

## 2016-03-17 DIAGNOSIS — Z85828 Personal history of other malignant neoplasm of skin: Secondary | ICD-10-CM | POA: Diagnosis not present

## 2016-03-17 DIAGNOSIS — D0461 Carcinoma in situ of skin of right upper limb, including shoulder: Secondary | ICD-10-CM | POA: Diagnosis not present

## 2016-03-17 DIAGNOSIS — L570 Actinic keratosis: Secondary | ICD-10-CM | POA: Diagnosis not present

## 2016-04-28 DIAGNOSIS — Z961 Presence of intraocular lens: Secondary | ICD-10-CM | POA: Diagnosis not present

## 2016-04-28 DIAGNOSIS — H353130 Nonexudative age-related macular degeneration, bilateral, stage unspecified: Secondary | ICD-10-CM | POA: Diagnosis not present

## 2016-04-28 DIAGNOSIS — H26491 Other secondary cataract, right eye: Secondary | ICD-10-CM | POA: Diagnosis not present

## 2016-04-28 DIAGNOSIS — H43813 Vitreous degeneration, bilateral: Secondary | ICD-10-CM | POA: Insufficient documentation

## 2016-05-13 DIAGNOSIS — E559 Vitamin D deficiency, unspecified: Secondary | ICD-10-CM | POA: Diagnosis not present

## 2016-05-13 DIAGNOSIS — E784 Other hyperlipidemia: Secondary | ICD-10-CM | POA: Diagnosis not present

## 2016-05-13 DIAGNOSIS — E038 Other specified hypothyroidism: Secondary | ICD-10-CM | POA: Diagnosis not present

## 2016-05-13 DIAGNOSIS — E538 Deficiency of other specified B group vitamins: Secondary | ICD-10-CM | POA: Diagnosis not present

## 2016-06-04 DIAGNOSIS — Z0000 Encounter for general adult medical examination without abnormal findings: Secondary | ICD-10-CM | POA: Diagnosis not present

## 2016-06-04 DIAGNOSIS — I2510 Atherosclerotic heart disease of native coronary artery without angina pectoris: Secondary | ICD-10-CM | POA: Diagnosis not present

## 2016-06-04 DIAGNOSIS — E538 Deficiency of other specified B group vitamins: Secondary | ICD-10-CM | POA: Diagnosis not present

## 2016-06-04 DIAGNOSIS — F329 Major depressive disorder, single episode, unspecified: Secondary | ICD-10-CM | POA: Diagnosis not present

## 2016-06-19 DIAGNOSIS — Z961 Presence of intraocular lens: Secondary | ICD-10-CM | POA: Diagnosis not present

## 2016-06-19 DIAGNOSIS — H26491 Other secondary cataract, right eye: Secondary | ICD-10-CM | POA: Diagnosis not present

## 2016-07-06 ENCOUNTER — Ambulatory Visit (INDEPENDENT_AMBULATORY_CARE_PROVIDER_SITE_OTHER): Payer: Medicare Other | Admitting: Cardiology

## 2016-07-06 ENCOUNTER — Encounter: Payer: Self-pay | Admitting: Cardiology

## 2016-07-06 VITALS — BP 118/60 | HR 81 | Ht 63.5 in | Wt 137.0 lb

## 2016-07-06 DIAGNOSIS — I2510 Atherosclerotic heart disease of native coronary artery without angina pectoris: Secondary | ICD-10-CM | POA: Diagnosis not present

## 2016-07-06 DIAGNOSIS — R002 Palpitations: Secondary | ICD-10-CM | POA: Diagnosis not present

## 2016-07-06 NOTE — Progress Notes (Signed)
Cardiology Office Note   Date:  07/06/2016   ID:  Annelise, Blizard 1933/09/17, MRN [id number]  PCP:  Jerlyn Ly, MD  Cardiologist:  Dr. Acie Fredrickson     Chief Complaint  Patient presents with  . Coronary Artery Disease    extra beats      History of Present Illness: Susan [name] is a 83 y.o. female who presents for CAD s/p PTCA and stenting of RCA.  Also mod. prox LAD stenosis.  HLD, hypothyoridism,   On last visit a year ago she was stable.  No chest pain.   Last echo 2013 with EF 50-55%.  Today pt asked to be seen earlier than scheduled appt.  She had to hard heart beats and was worried that she had a heart problem.  She was worried about her grandson at the time.  She has had no episodes sine.  I reviewed her last cath in 2012 which she did not remember.  She had been admitted with syncope.  She does become SOB with walking upstairs and has to stop.    Past Medical History:  Diagnosis Date  . Coronary artery disease    POST PTCA AND STENTING OF HER RIGHT CORONARY ARTERY  . Hypothyroidism   . LAD stenosis    MODERATE 50-60% STENOSIS  . Osteoporosis     Past Surgical History:  Procedure Laterality Date  . fractured wrist repair    . KNEE SURGERY     arthroscopic  torn meniscus  . LUMBAR LAMINECTOMY     years  ago  . ovarian cyst rupture and appy       Current Outpatient Prescriptions  Medication Sig Dispense Refill  . Calcium Carbonate-Vitamin D (CALCIUM + D PO) Take by mouth 2 (two) times daily.      . clopidogrel (PLAVIX) 75 MG tablet TAKE ONE TABLET BY MOUTH ONCE DAILY 90 tablet 1  . fish oil-omega-3 fatty acids 1000 MG capsule Take by mouth daily.      Marland Kitchen levothyroxine (SYNTHROID) 50 MCG tablet Take 1 tablet (50 mcg total) by mouth daily before breakfast. 1 tablet   . nitroGLYCERIN (NITROSTAT) 0.4 MG SL tablet Place 1 tablet (0.4 mg total) under the tongue every 5 (five) minutes as needed. 25 tablet 3  . rosuvastatin (CRESTOR) 40 MG tablet Take 1 tablet  (40 mg total) by mouth daily. 90 tablet 3  . venlafaxine XR (EFFEXOR-XR) 150 MG 24 hr capsule TAKE ONE CAPSULE BY MOUTH ONCE DAILY 90 capsule 2  . vitamin B-12 (CYANOCOBALAMIN) 1000 MCG tablet Take 1,000 mcg by mouth daily.      . zoledronic acid (RECLAST) 5 MG/100ML SOLN injection Inject 5 mg into the vein once.     No current facility-administered medications for this visit.     Allergies:   Patient has no known allergies.    Social History:  The patient  reports that she has never smoked. She has never used smokeless tobacco. She reports that she drinks alcohol. She reports that she does not use drugs.   Family History:  The patient's family history includes Alzheimer's disease in her mother; Aneurysm in her sister; Heart attack in her father.    ROS:  General:no colds or fevers, no weight changes Skin:no rashes or ulcers HEENT:no blurred vision, no congestion CV:see HPI PUL:see HPI GI:no diarrhea constipation or melena, no indigestion GU:no hematuria, no dysuria MS:no joint pain, no claudication Neuro:no syncope, no lightheadedness Endo:no diabetes, + thyroid disease  Wt  Readings from Last 3 Encounters:  07/06/16 137 lb (62.1 kg)  07/25/15 134 lb (60.8 kg)  07/18/14 130 lb 12.8 oz (59.3 kg)     PHYSICAL EXAM: VS:  BP 118/60   Pulse 81   Ht 5' 3.5" (1.613 m)   Wt 137 lb (62.1 kg)   BMI 23.89 kg/m  , BMI Body mass index is 23.89 kg/m. General:Pleasant affect, NAD Skin:Warm and dry, brisk capillary refill HEENT:normocephalic, sclera clear, mucus membranes moist Neck:supple, no JVD, no bruits  Heart:S1S2 RRR without murmur, gallup, rub or click Lungs:clear without rales, rhonchi, or wheezes [id number], non tender, + BS, do not palpate liver spleen or masses Ext:no lower ext edema, 2+ pedal pulses, 2+ radial pulses Neuro:alert and oriented X 3, MAE, follows commands, + facial symmetry    EKG:  EKG is ordered today. The ekg ordered today demonstrates SR with LBBB  chronic   Recent Labs: No results found for requested labs within last 8760 hours.    Lipid Panel    Component Value Date/Time   CHOL 177 10/11/2013 1618   TRIG 277.0 (H) 10/11/2013 1618   HDL 50.30 10/11/2013 1618   CHOLHDL 4 10/11/2013 1618   VLDL 55.4 (H) 10/11/2013 1618   LDLCALC 71 10/11/2013 1618       Other studies Reviewed: Additional studies/ records that were reviewed today include: .  Last cath 08/2010 ANGIOGRAPHIC FINDINGS: 1. The left main coronary artery had no evidence of disease. 2. The left anterior descending was a moderate-sized vessel in its     proximal portion and tapered to a smaller caliber vessel throughout     the mid and distal portion.  The midvessel had a long tubular 50%     stenosis.  This appeared to be unchanged from film in 2005. 3. The circumflex artery had minor luminal irregularities. 4. The right coronary artery was a large dominant vessel with 30%     proximal stenosis just prior to the stent in the mid segment.  The     stent in the mid segment was patent with no evidence of restenosis.     The stent in the distal segment was patent with no restenosis. 5. Left ventricular angiogram was performed in the RAO projection and     showed mild left ventricular systolic dysfunction with ejection     fraction of 45-50%.  IMPRESSION: 1. Double-vessel coronary artery disease, stable. 2. Mild left ventricular systolic dysfunction.  RECOMMENDATIONS:  I would recommend continued medical management  ASSESSMENT AND PLAN:  1.  Palpitations once episode may have been due to anxiety.  We discussed lexiscan myoview but she was convinced MD should be in the room with her like it was before.  I explained but she became nervous.  At this point she prefers to do nothing unless re-occurrence - her labs recently were normal.  She will follow up with Dr. Acie Fredrickson on 07/28/16  2. CAD with stents to RCA   Current medicines are reviewed with the patient  today.  The patient Has no concerns regarding medicines.  The following changes have been made:  See above Labs/ tests ordered today include:see above  Disposition:   FU:  see above  Signed, [name], NP  07/06/2016 5:31 PM    [facility] [address], [place], [place], [place] Phone: [phone]; Fax: [phone]

## 2016-07-06 NOTE — Patient Instructions (Signed)
Medication Instructions:  Your physician recommends that you continue on your current medications as directed. Please refer to the Current Medication list given to you today.   Labwork: None  Testing/Procedures: None  Follow-Up: You have an appointment with Dr. Acie Fredrickson 07/28/16 at 1:45PM.  Any Other Special Instructions Will Be Listed Below (If Applicable).     If you need a refill on your cardiac medications before your next appointment, please call your pharmacy.

## 2016-07-16 ENCOUNTER — Encounter: Payer: Self-pay | Admitting: *Deleted

## 2016-07-28 ENCOUNTER — Encounter: Payer: Self-pay | Admitting: Cardiovascular Disease

## 2016-07-28 ENCOUNTER — Ambulatory Visit (INDEPENDENT_AMBULATORY_CARE_PROVIDER_SITE_OTHER): Payer: Medicare Other | Admitting: Cardiovascular Disease

## 2016-07-28 ENCOUNTER — Ambulatory Visit: Payer: Medicare Other | Admitting: Cardiovascular Disease

## 2016-07-28 VITALS — BP 112/70 | HR 96 | Ht 63.5 in | Wt 134.1 lb

## 2016-07-28 DIAGNOSIS — I2510 Atherosclerotic heart disease of native coronary artery without angina pectoris: Secondary | ICD-10-CM

## 2016-07-28 DIAGNOSIS — E038 Other specified hypothyroidism: Secondary | ICD-10-CM | POA: Diagnosis not present

## 2016-07-28 NOTE — Progress Notes (Signed)
Cardiology Office Note   Date:  07/28/2016   ID:  Susan [name], Susan [name] 12-Mar-1934, MRN [id number]  PCP:  Jerlyn Ly, MD  Cardiologist:   Mertie Moores, MD   Chief Complaint  Patient presents with  . Coronary Artery Disease  . Hyperlipidemia   1. Coronary artery disease-status post PTCA and stenting of her right coronary artery. We placed a 2.75 x 16 mm Taxus stent deployed at 18 atmospheres. She also has a moderate proximal LAD stenosis. 2. Hypothyroidism 3. Hyperlipidemia-   Previous Notes:    Susan [name] is a 83 yo with a hx of CAD. She is still playing tennis regularly. She has not had any angina. She has some DOE.   October 26, 2012:  Susan [name] is doing well. She is still playing lots of tennis. No CP. She has had a lifeline screening - her carotid arteries are normal. She denies episodes of chest pain or shortness of breath.  October 11, 2013:  Susan [name] is not feeling well today. "Feels like she is jumping out of her skin" Got back from a trip , she found out that a friend had died.   2014-01-11:  Susan [name] is doing ok. She has had some eye problems. Her eyesite is ok.  No angina. She does have some mild dyspnea if she climbs 3-4 flights of stairs. Still playing tennis regularly.    July 18, 2014:  Susan [name] is a 83 y.o. female who presents for follow up for her CAD. Just got back from a mission trip in [place]. Did lots of painting and cleaning. No angina or dyspnea.   Is not playing as much tennis - her tennis team has moved on.  2 of her partners have died , 1 is too unstable on her feet.   July 25, 2015:  Doing well.  Very active in her garden .  Plays tennis regularly . Plays on soft courts because of her back and knee pain .  July 28, 2016:  Susan [name] is seen today  Recent labs at [facility] reveals Chol = 155 Trigs = 113 HDL = 59 LDL = 73  She is upset today .   Her grandson OD'd on cough syrup He is doing to a  rehab place today   Saw Susan Kicks, NP on March 5 for palpitations.  Walking some.   Looking forward to getting back into tennis . No CP,  Palpitations have resolved.    Past Medical History:  Diagnosis Date  . Coronary artery disease    POST PTCA AND STENTING OF HER RIGHT CORONARY ARTERY  . Hypothyroidism   . LAD stenosis    MODERATE 50-60% STENOSIS  . Osteoporosis     Past Surgical History:  Procedure Laterality Date  . fractured wrist repair    . KNEE SURGERY     arthroscopic  torn meniscus  . LUMBAR LAMINECTOMY     years  ago  . ovarian cyst rupture and appy       Current Outpatient Prescriptions  Medication Sig Dispense Refill  . Calcium Carbonate-Vitamin D (CALCIUM + D PO) Take by mouth 2 (two) times daily.      . clopidogrel (PLAVIX) 75 MG tablet TAKE ONE TABLET BY MOUTH ONCE DAILY 90 tablet 1  . fish oil-omega-3 fatty acids 1000 MG capsule Take by mouth daily.      Marland Kitchen levothyroxine (SYNTHROID) 50 MCG tablet Take 1 tablet (50 mcg total) by mouth daily before breakfast.  1 tablet   . nitroGLYCERIN (NITROSTAT) 0.4 MG SL tablet Place 1 tablet (0.4 mg total) under the tongue every 5 (five) minutes as needed. 25 tablet 3  . rosuvastatin (CRESTOR) 40 MG tablet Take 1 tablet (40 mg total) by mouth daily. 90 tablet 3  . venlafaxine XR (EFFEXOR-XR) 150 MG 24 hr capsule TAKE ONE CAPSULE BY MOUTH ONCE DAILY 90 capsule 2  . vitamin B-12 (CYANOCOBALAMIN) 1000 MCG tablet Take 1,000 mcg by mouth daily.      . zoledronic acid (RECLAST) 5 MG/100ML SOLN injection Inject 5 mg into the vein once.     No current facility-administered medications for this visit.     Allergies:   Patient has no known allergies.    Social History:  The patient  reports that she has never smoked. She has never used smokeless tobacco. She reports that she drinks alcohol. She reports that she does not use drugs.   Family History:  The patient's family history includes Alzheimer's disease in her mother;  Aneurysm in her sister; Heart attack in her father.    ROS:  Please see the history of present illness.    Review of Systems: Constitutional:  denies fever, chills, diaphoresis, appetite change and fatigue.  HEENT: denies photophobia, eye pain, redness, hearing loss, ear pain, congestion, sore throat, rhinorrhea, sneezing, neck pain, neck stiffness and tinnitus.  Respiratory: denies SOB, DOE, cough, chest tightness, and wheezing.  Cardiovascular: denies chest pain, palpitations and leg swelling.  Gastrointestinal: denies nausea, vomiting, abdominal pain, diarrhea, constipation, blood in stool.  Genitourinary: denies dysuria, urgency, frequency, hematuria, flank pain and difficulty urinating.  Musculoskeletal: denies  myalgias, back pain, joint swelling, arthralgias and gait problem.   Skin: denies pallor, rash and wound.  Neurological: denies dizziness, seizures, syncope, weakness, light-headedness, numbness and headaches.   Hematological: denies adenopathy, easy bruising, personal or family bleeding history.  Psychiatric/ Behavioral: denies suicidal ideation, mood changes, confusion, nervousness, sleep disturbance and agitation.       All other systems are reviewed and negative.    PHYSICAL EXAM: VS:  BP 112/70 (BP Location: Right Arm, Patient Position: Sitting, Cuff Size: Normal)   Pulse 96   Ht 5' 3.5" (1.613 m)   Wt 134 lb 1.9 oz (60.8 kg)   SpO2 96%   BMI 23.39 kg/m  , BMI Body mass index is 23.39 kg/m. GEN: Well nourished, well developed, in no acute distress  HEENT: normal  Neck: no JVD, carotid bruits, or masses Cardiac: RRR; no murmurs, rubs, or gallops,no edema  Respiratory:  clear to auscultation bilaterally, normal work of breathing GI: soft, nontender, nondistended, + BS MS: no deformity or atrophy  Skin: warm and dry, no rash Neuro:  Strength and sensation are intact Psych: normal   EKG:  EKG is ordered today. The ekg ordered today demonstrates NSR at 67    LBBB. The PVCs have resolved otherwise No changes from previous tracing    Recent Labs: No results found for requested labs within last 8760 hours.    Lipid Panel    Component Value Date/Time   CHOL 177 10/11/2013 1618   TRIG 277.0 (H) 10/11/2013 1618   HDL 50.30 10/11/2013 1618   CHOLHDL 4 10/11/2013 1618   VLDL 55.4 (H) 10/11/2013 1618   LDLCALC 71 10/11/2013 1618      Wt Readings from Last 3 Encounters:  07/28/16 134 lb 1.9 oz (60.8 kg)  07/06/16 137 lb (62.1 kg)  07/25/15 134 lb (60.8 kg)  Other studies Reviewed: Additional studies/ records that were reviewed today include: . Review of the above records demonstrates:    ASSESSMENT AND PLAN:  1. Coronary artery disease-status post PTCA and stenting of her right coronary artery in 2005 . We placed a 2.75 x 16 mm Taxus stent deployed at 18 atmospheres. She also has a moderate proximal LAD stenosis. Repeat cath by [facility] in 2012 .   She remains very active.      No angina.   Continue current meds.  return in 6 months   2. Hypothyroidism-  followed by Dr. Joylene Draft.  3. Hyperlipidemia-  On crestor  40 mg a day.   Will recheck labs in 6 months   Current medicines are reviewed at length with the patient today.  The patient does not have concerns regarding medicines.  The following changes have been made:  no change  Labs/ tests ordered today include:  No orders of the defined types were placed in this encounter.     Signed, Mertie Moores, MD  07/28/2016 1:56 PM    [facility] [place], [place], [place]  [address] Phone: [phone]; Fax: [phone]

## 2016-07-28 NOTE — Patient Instructions (Signed)

## 2016-08-10 ENCOUNTER — Other Ambulatory Visit: Payer: Self-pay | Admitting: Cardiovascular Disease

## 2016-09-28 ENCOUNTER — Other Ambulatory Visit: Payer: Self-pay | Admitting: Cardiovascular Disease

## 2016-10-27 DIAGNOSIS — Z961 Presence of intraocular lens: Secondary | ICD-10-CM | POA: Diagnosis not present

## 2016-10-27 DIAGNOSIS — H43813 Vitreous degeneration, bilateral: Secondary | ICD-10-CM | POA: Diagnosis not present

## 2016-10-27 DIAGNOSIS — H26491 Other secondary cataract, right eye: Secondary | ICD-10-CM | POA: Diagnosis not present

## 2016-10-27 DIAGNOSIS — H353131 Nonexudative age-related macular degeneration, bilateral, early dry stage: Secondary | ICD-10-CM | POA: Diagnosis not present

## 2017-03-04 DIAGNOSIS — Z1231 Encounter for screening mammogram for malignant neoplasm of breast: Secondary | ICD-10-CM | POA: Diagnosis not present

## 2017-03-10 DIAGNOSIS — Z23 Encounter for immunization: Secondary | ICD-10-CM | POA: Diagnosis not present

## 2017-03-11 ENCOUNTER — Other Ambulatory Visit: Payer: Self-pay | Admitting: Cardiovascular Disease

## 2017-03-11 ENCOUNTER — Encounter: Payer: Self-pay | Admitting: Cardiovascular Disease

## 2017-03-11 ENCOUNTER — Encounter (INDEPENDENT_AMBULATORY_CARE_PROVIDER_SITE_OTHER): Payer: Self-pay

## 2017-03-11 ENCOUNTER — Ambulatory Visit: Payer: Medicare Other | Admitting: Cardiovascular Disease

## 2017-03-11 VITALS — BP 110/60 | HR 76 | Ht 63.5 in | Wt 131.4 lb

## 2017-03-11 DIAGNOSIS — I2510 Atherosclerotic heart disease of native coronary artery without angina pectoris: Secondary | ICD-10-CM | POA: Diagnosis not present

## 2017-03-11 DIAGNOSIS — E782 Mixed hyperlipidemia: Secondary | ICD-10-CM | POA: Diagnosis not present

## 2017-03-11 NOTE — Patient Instructions (Signed)
Medication Instructions:  Your physician recommends that you continue on your current medications as directed. Please refer to the Current Medication list given to you today.   Labwork: None Ordered   Testing/Procedures: None Ordered   Follow-Up: Your physician wants you to follow-up in: 6 months with Dr. Nahser.  You will receive a reminder letter in the mail two months in advance. If you don't receive a letter, please call our office to schedule the follow-up appointment.   If you need a refill on your cardiac medications before your next appointment, please call your pharmacy.   Thank you for choosing [facility]! Rainah Kirshner, RN [phone]    

## 2017-03-11 NOTE — Progress Notes (Signed)
Cardiology Office Note   Date:  03/11/2017   ID:  Susan [name], Susan [name] 10-May-1933, MRN [id number]  PCP:  Crist Infante, MD  Cardiologist:   Mertie Moores, MD   Chief Complaint  Patient presents with  . Coronary Artery Disease   1. Coronary artery disease-status post PTCA and stenting of her right coronary artery. We placed a 2.75 x 16 mm Taxus stent deployed at 18 atmospheres. She also has a moderate proximal LAD stenosis. 2. Hypothyroidism 3. Hyperlipidemia-   Previous Notes:    Susan [name] is a 83 yo with a hx of CAD. She is still playing tennis regularly. She has not had any angina. She has some DOE.   October 26, 2012:  Susan [name] is doing well. She is still playing lots of tennis. No CP. She has had a lifeline screening - her carotid arteries are normal. She denies episodes of chest pain or shortness of breath.  October 11, 2013:  Susan [name] is not feeling well today. "Feels like she is jumping out of her skin" Got back from a trip , she found out that a friend had died.   01-10-14:  Susan [name] is doing ok. She has had some eye problems. Her eyesite is ok.  No angina. She does have some mild dyspnea if she climbs 3-4 flights of stairs. Still playing tennis regularly.    July 18, 2014:  Susan [name] is a 83 y.o. female who presents for follow up for her CAD. Just got back from a mission trip in [place]. Did lots of painting and cleaning. No angina or dyspnea.   Is not playing as much tennis - her tennis team has moved on.  2 of her partners have died , 1 is too unstable on her feet.   July 25, 2015:  Doing well.  Very active in her garden .  Plays tennis regularly . Plays on soft courts because of her back and knee pain .  July 28, 2016:  Susan [name] is seen today  Recent labs at [facility] reveals Chol = 155 Trigs = 113 HDL = 59 LDL = 73  She is upset today .   Her grandson OD'd on cough syrup He is doing to a rehab place today     Saw [name], NP on March 5 for palpitations.  Walking some.   Looking forward to getting back into tennis . No CP,  Palpitations have resolved.   Nov. 8, 2018  Doing well Still playing tennis  Plays on soft courts  No CP or dyspnea  Has occasional episodes of near syncope .  Most of the time when she is sitting down.   Last for a second or so.   Feels fine right after the episode  Has had 2 episodes over the past 6 months .   Did not pass out   Past Medical History:  Diagnosis Date  . Coronary artery disease    POST PTCA AND STENTING OF HER RIGHT CORONARY ARTERY  . Hypothyroidism   . LAD stenosis    MODERATE 50-60% STENOSIS  . Osteoporosis     Past Surgical History:  Procedure Laterality Date  . fractured wrist repair    . KNEE SURGERY     arthroscopic  torn meniscus  . LUMBAR LAMINECTOMY     years  ago  . ovarian cyst rupture and appy       Current Outpatient Medications  Medication Sig Dispense Refill  . Calcium  Carbonate-Vitamin D (CALCIUM + D PO) Take by mouth 2 (two) times daily.      . clopidogrel (PLAVIX) 75 MG tablet TAKE ONE TABLET BY MOUTH ONCE DAILY. 90 tablet 1  . fish oil-omega-3 fatty acids 1000 MG capsule Take by mouth daily.      Marland Kitchen levothyroxine (SYNTHROID) 50 MCG tablet Take 1 tablet (50 mcg total) by mouth daily before breakfast. 1 tablet   . multivitamin-lutein (OCUVITE-LUTEIN) CAPS capsule Take 1 capsule daily by mouth.    . nitroGLYCERIN (NITROSTAT) 0.4 MG SL tablet Place 1 tablet (0.4 mg total) under the tongue every 5 (five) minutes as needed. 25 tablet 3  . rosuvastatin (CRESTOR) 40 MG tablet TAKE ONE TABLET BY MOUTH ONCE DAILY 90 tablet 2  . venlafaxine XR (EFFEXOR-XR) 150 MG 24 hr capsule TAKE ONE CAPSULE BY MOUTH ONCE DAILY 90 capsule 2  . vitamin B-12 (CYANOCOBALAMIN) 1000 MCG tablet Take 1,000 mcg by mouth daily.       No current facility-administered medications for this visit.     Allergies:   Patient has no known allergies.     Social History:  The patient  reports that  has never smoked. she has never used smokeless tobacco. She reports that she drinks alcohol. She reports that she does not use drugs.   Family History:  The patient's family history includes Alzheimer's disease in her mother; Aneurysm in her sister; Heart attack in her father.    ROS:  As per history, otherwise negative     All other systems are reviewed and negative.   Physical Exam: Blood pressure 110/60, pulse 76, height 5' 3.5" (1.613 m), weight 131 lb 6.4 oz (59.6 kg), SpO2 97 %.  GEN:  Well nourished, well developed in no acute distress HEENT: Normal NECK: No JVD; No carotid bruits LYMPHATICS: No lymphadenopathy CARDIAC: RRR , no murmurs, rubs, gallops RESPIRATORY:  Clear to auscultation without rales, wheezing or rhonchi  ABDOMEN: Soft, non-tender, non-distended MUSCULOSKELETAL:  No edema; No deformity  SKIN: Warm and dry NEUROLOGIC:  Alert and oriented x 3   EKG:    Recent Labs: No results found for requested labs within last 8760 hours.    Lipid Panel    Component Value Date/Time   CHOL 177 10/11/2013 1618   TRIG 277.0 (H) 10/11/2013 1618   HDL 50.30 10/11/2013 1618   CHOLHDL 4 10/11/2013 1618   VLDL 55.4 (H) 10/11/2013 1618   LDLCALC 71 10/11/2013 1618      Wt Readings from Last 3 Encounters:  03/11/17 131 lb 6.4 oz (59.6 kg)  07/28/16 134 lb 1.9 oz (60.8 kg)  07/06/16 137 lb (62.1 kg)      Other studies Reviewed: Additional studies/ records that were reviewed today include: . Review of the above records demonstrates:    ASSESSMENT AND PLAN:  1. Coronary artery disease-status post PTCA and stenting of her right coronary artery in 2005 . We placed a 2.75 x 16 mm Taxus stent deployed at 18 atmospheres. She also has a moderate proximal LAD stenosis. Repeat cath by [facility] in 2012 .  Is not having any CP or dyspnea.  Plays tennis without any problems. Overall  she seems to be very stable.    2.  Near syncope:   Offered to place a monitor.   The episodes are not frequent enough.   She does not want a monitor yet    3. Hyperlipidemia-  Managed by Dr. Joylene Draft. Trigs = 113 HDL = 59 LDL =  73   Current medicines are reviewed at length with the patient today.  The patient does not have concerns regarding medicines.  The following changes have been made:  no change  Labs/ tests ordered today include:  No orders of the defined types were placed in this encounter.     Signed, Mertie Moores, MD  03/11/2017 9:11 AM    [facility] [place], [place], [place]  [address] Phone: [phone]; Fax: [phone]

## 2017-06-18 DIAGNOSIS — R82998 Other abnormal findings in urine: Secondary | ICD-10-CM | POA: Diagnosis not present

## 2017-06-18 DIAGNOSIS — E7849 Other hyperlipidemia: Secondary | ICD-10-CM | POA: Diagnosis not present

## 2017-06-18 DIAGNOSIS — E538 Deficiency of other specified B group vitamins: Secondary | ICD-10-CM | POA: Diagnosis not present

## 2017-06-18 DIAGNOSIS — E559 Vitamin D deficiency, unspecified: Secondary | ICD-10-CM | POA: Diagnosis not present

## 2017-06-18 DIAGNOSIS — E038 Other specified hypothyroidism: Secondary | ICD-10-CM | POA: Diagnosis not present

## 2017-06-22 ENCOUNTER — Other Ambulatory Visit: Payer: Self-pay | Admitting: Cardiovascular Disease

## 2017-06-25 DIAGNOSIS — E538 Deficiency of other specified B group vitamins: Secondary | ICD-10-CM | POA: Diagnosis not present

## 2017-06-25 DIAGNOSIS — F3289 Other specified depressive episodes: Secondary | ICD-10-CM | POA: Diagnosis not present

## 2017-06-25 DIAGNOSIS — Z0000 Encounter for general adult medical examination without abnormal findings: Secondary | ICD-10-CM | POA: Diagnosis not present

## 2017-06-25 DIAGNOSIS — I2510 Atherosclerotic heart disease of native coronary artery without angina pectoris: Secondary | ICD-10-CM | POA: Diagnosis not present

## 2017-07-01 DIAGNOSIS — Z1212 Encounter for screening for malignant neoplasm of rectum: Secondary | ICD-10-CM | POA: Diagnosis not present

## 2017-07-05 ENCOUNTER — Other Ambulatory Visit: Payer: Self-pay | Admitting: Cardiovascular Disease

## 2017-07-06 DIAGNOSIS — H26491 Other secondary cataract, right eye: Secondary | ICD-10-CM | POA: Diagnosis not present

## 2017-07-06 DIAGNOSIS — H43813 Vitreous degeneration, bilateral: Secondary | ICD-10-CM | POA: Diagnosis not present

## 2017-07-06 DIAGNOSIS — H353131 Nonexudative age-related macular degeneration, bilateral, early dry stage: Secondary | ICD-10-CM | POA: Diagnosis not present

## 2017-07-06 DIAGNOSIS — Z961 Presence of intraocular lens: Secondary | ICD-10-CM | POA: Diagnosis not present

## 2017-07-28 DIAGNOSIS — D0461 Carcinoma in situ of skin of right upper limb, including shoulder: Secondary | ICD-10-CM | POA: Diagnosis not present

## 2017-07-28 DIAGNOSIS — C44722 Squamous cell carcinoma of skin of right lower limb, including hip: Secondary | ICD-10-CM | POA: Diagnosis not present

## 2017-07-28 DIAGNOSIS — L821 Other seborrheic keratosis: Secondary | ICD-10-CM | POA: Diagnosis not present

## 2017-07-28 DIAGNOSIS — Z85828 Personal history of other malignant neoplasm of skin: Secondary | ICD-10-CM | POA: Diagnosis not present

## 2017-08-25 DIAGNOSIS — E038 Other specified hypothyroidism: Secondary | ICD-10-CM | POA: Diagnosis not present

## 2017-08-31 DIAGNOSIS — K1370 Unspecified lesions of oral mucosa: Secondary | ICD-10-CM | POA: Diagnosis not present

## 2017-08-31 DIAGNOSIS — E038 Other specified hypothyroidism: Secondary | ICD-10-CM | POA: Diagnosis not present

## 2017-09-22 ENCOUNTER — Encounter (INDEPENDENT_AMBULATORY_CARE_PROVIDER_SITE_OTHER): Payer: Self-pay

## 2017-09-22 ENCOUNTER — Encounter: Payer: Self-pay | Admitting: Cardiovascular Disease

## 2017-09-22 ENCOUNTER — Ambulatory Visit: Payer: Medicare Other | Admitting: Cardiovascular Disease

## 2017-09-22 VITALS — BP 118/80 | HR 71 | Ht 63.0 in | Wt 134.0 lb

## 2017-09-22 DIAGNOSIS — R0600 Dyspnea, unspecified: Secondary | ICD-10-CM

## 2017-09-22 DIAGNOSIS — R0609 Other forms of dyspnea: Secondary | ICD-10-CM

## 2017-09-22 DIAGNOSIS — I2510 Atherosclerotic heart disease of native coronary artery without angina pectoris: Secondary | ICD-10-CM

## 2017-09-22 NOTE — Progress Notes (Signed)
Cardiology Office Note   Date:  09/22/2017   ID:  Susan, [name] [birth year], MRN [id number]  PCP:  Crist Infante, MD  Cardiologist:   Mertie Moores, MD   Chief Complaint  Patient presents with  . Coronary Artery Disease   1. Coronary artery disease-status post PTCA and stenting of her right coronary artery. We placed a 2.75 x 16 mm Taxus stent deployed at 18 atmospheres. She also has a moderate proximal LAD stenosis. 2. Hypothyroidism 3. Hyperlipidemia-   Previous Notes:    Susan [name] is a 84 yo with a hx of CAD. She is still playing tennis regularly. She has not had any angina. She has some DOE.   October 26, 2012:  Tailor is doing well. She is still playing lots of tennis. No CP. She has had a lifeline screening - her carotid arteries are normal. She denies episodes of chest pain or shortness of breath.  October 11, 2013:  Susan [name] is not feeling well today. "Feels like she is jumping out of her skin" Got back from a trip , she found out that a friend had died.   2014-01-19:  Susan [name] is doing ok. She has had some eye problems. Her eyesite is ok.  No angina. She does have some mild dyspnea if she climbs 3-4 flights of stairs. Still playing tennis regularly.    July 18, 2014:  Susan [name] is a 84 y.o. female who presents for follow up for her CAD. Just got back from a mission trip in [place]. Did lots of painting and cleaning. No angina or dyspnea.   Is not playing as much tennis - her tennis team has moved on.  2 of her partners have died , 1 is too unstable on her feet.   July 25, 2015:  Doing well.  Very active in her garden .  Plays tennis regularly . Plays on soft courts because of her back and knee pain .  July 28, 2016:  Susan [name] is seen today  Recent labs at [facility] reveals Chol = 155 Trigs = 113 HDL = 59 LDL = 73  She is upset today .   Her grandson OD'd on cough syrup He is doing to a rehab place today     Saw [name], NP on March 5 for palpitations.  Walking some.   Looking forward to getting back into tennis . No CP,  Palpitations have resolved.   Nov. 8, 2018  Doing well Still playing tennis  Plays on soft courts  No CP or dyspnea  Has occasional episodes of near syncope .  Most of the time when she is sitting down.   Last for a second or so.   Feels fine right after the episode  Has had 2 episodes over the past 6 months .   Did not pass out  Sep 22, 2017:  Doing well.   Not playing as much tennis now - most of her tennis friends have died. .    Has occasional DOE while playing tennis for a long point or walking up a hill.  No real chest pain   Past Medical History:  Diagnosis Date  . Coronary artery disease    POST PTCA AND STENTING OF HER RIGHT CORONARY ARTERY  . Hypothyroidism   . LAD stenosis    MODERATE 50-60% STENOSIS  . Osteoporosis     Past Surgical History:  Procedure Laterality Date  . fractured wrist repair    .  KNEE SURGERY     arthroscopic  torn meniscus  . LUMBAR LAMINECTOMY     years  ago  . ovarian cyst rupture and appy       Current Outpatient Medications  Medication Sig Dispense Refill  . Calcium Carbonate-Vitamin D (CALCIUM + D PO) Take by mouth 2 (two) times daily.      . clopidogrel (PLAVIX) 75 MG tablet TAKE 1 TABLET BY MOUTH ONCE DAILY 90 tablet 1  . fish oil-omega-3 fatty acids 1000 MG capsule Take by mouth daily.      Marland Kitchen levothyroxine (SYNTHROID) 50 MCG tablet Take 1 tablet (50 mcg total) by mouth daily before breakfast. 1 tablet   . multivitamin-lutein (OCUVITE-LUTEIN) CAPS capsule Take 1 capsule daily by mouth.    . nitroGLYCERIN (NITROSTAT) 0.4 MG SL tablet Place 1 tablet (0.4 mg total) under the tongue every 5 (five) minutes as needed. 25 tablet 3  . rosuvastatin (CRESTOR) 40 MG tablet TAKE 1 TABLET BY MOUTH ONCE DAILY 90 tablet 1  . venlafaxine XR (EFFEXOR-XR) 150 MG 24 hr capsule TAKE 1 CAPSULE BY MOUTH ONCE DAILY 90 capsule 2   . vitamin B-12 (CYANOCOBALAMIN) 1000 MCG tablet Take 1,000 mcg by mouth daily.       No current facility-administered medications for this visit.     Allergies:   Patient has no known allergies.    Social History:  The patient  reports that she has never smoked. She has never used smokeless tobacco. She reports that she drinks alcohol. She reports that she does not use drugs.   Family History:  The patient's family history includes Alzheimer's disease in her mother; Aneurysm in her sister; Heart attack in her father.    ROS:   Noted in current history, otherwise review of systems is negative.  Physical Exam: Blood pressure 118/80, pulse 71, height 5\' 3"  (1.6 m), weight 134 lb (60.8 kg), SpO2 99 %.  GEN:  Well nourished, well developed in no acute distress HEENT: Normal NECK: No JVD; No carotid bruits LYMPHATICS: No lymphadenopathy CARDIAC: RRR , no murmurs, rubs, gallops RESPIRATORY:  Clear to auscultation without rales, wheezing or rhonchi  ABDOMEN: Soft, non-tender, non-distended MUSCULOSKELETAL:  No edema; No deformity  SKIN: Warm and dry NEUROLOGIC:  Alert and oriented x 3   EKG:    Sep 22, 2017: Normal sinus rhythm at 71.  Left bundle branch block.  Recent Labs: No results found for requested labs within last 8760 hours.    Lipid Panel    Component Value Date/Time   CHOL 177 10/11/2013 1618   TRIG 277.0 (H) 10/11/2013 1618   HDL 50.30 10/11/2013 1618   CHOLHDL 4 10/11/2013 1618   VLDL 55.4 (H) 10/11/2013 1618   LDLCALC 71 10/11/2013 1618      Wt Readings from Last 3 Encounters:  09/22/17 134 lb (60.8 kg)  03/11/17 131 lb 6.4 oz (59.6 kg)  07/28/16 134 lb 1.9 oz (60.8 kg)      Other studies Reviewed: Additional studies/ records that were reviewed today include: . Review of the above records demonstrates:    ASSESSMENT AND PLAN:  1. Coronary artery disease-status post PTCA and stenting of her right coronary artery in 2005 . We placed a 2.75 x 16 mm  Taxus stent deployed at 18 atmospheres. She also has a moderate proximal LAD stenosis. Repeat cath by [facility] in 2012 .   2.  DOE:  Will get an echo Would have a low threshold to do a [employer]  myoview if she has an increase in symptoms .    2. Near syncope:   No recurrent symptoms   3. Hyperlipidemia-  Managed by Dr. Joylene Draft    Current medicines are reviewed at length with the patient today.  The patient does not have concerns regarding medicines.  The following changes have been made:  no change  Labs/ tests ordered today include:  No orders of the defined types were placed in this encounter.     Signed, Mertie Moores, MD  09/22/2017 11:31 AM    [facility] [place], [place], [place]  [address] Phone: [phone]; Fax: [phone]

## 2017-09-22 NOTE — Patient Instructions (Signed)
Medication Instructions:  Your physician recommends that you continue on your current medications as directed. Please refer to the Current Medication list given to you today.   Labwork: None Ordered   Testing/Procedures: Your physician has requested that you have an echocardiogram. Echocardiography is a painless test that uses sound waves to create images of your heart. It provides your doctor with information about the size and shape of your heart and how well your heart's chambers and valves are working. This procedure takes approximately one hour. There are no restrictions for this procedure.   Follow-Up Your physician wants you to follow-up in: 6 months with Dr. Nahser.  You will receive a reminder letter in the mail two months in advance. If you don't receive a letter, please call our office to schedule the follow-up appointment.   If you need a refill on your cardiac medications before your next appointment, please call your pharmacy.   Thank you for choosing [facility]! Zandra Lajeunesse, RN [phone]    

## 2017-09-22 NOTE — H&P (View-Only) (Signed)
Cardiology Office Note   Date:  09/22/2017   ID:  Susan [name], Susan [name] 1933/08/09, MRN [id number]  PCP:  Crist Infante, MD  Cardiologist:   Mertie Moores, MD   Chief Complaint  Patient presents with  . Coronary Artery Disease   1. Coronary artery disease-status post PTCA and stenting of her right coronary artery. We placed a 2.75 x 16 mm Taxus stent deployed at 18 atmospheres. She also has a moderate proximal LAD stenosis. 2. Hypothyroidism 3. Hyperlipidemia-   Previous Notes:    Susan [name] is a 84 yo with a hx of CAD. She is still playing tennis regularly. She has not had any angina. She has some DOE.   October 26, 2012:  Susan [name] is doing well. She is still playing lots of tennis. No CP. She has had a lifeline screening - her carotid arteries are normal. She denies episodes of chest pain or shortness of breath.  October 11, 2013:  Susan [name] is not feeling well today. "Feels like she is jumping out of her skin" Got back from a trip , she found out that a friend had died.   12/26/2013:  Susan [name] is doing ok. She has had some eye problems. Her eyesite is ok.  No angina. She does have some mild dyspnea if she climbs 3-4 flights of stairs. Still playing tennis regularly.    July 18, 2014:  Susan [name] is a 84 y.o. female who presents for follow up for her CAD. Just got back from a mission trip in [place]. Did lots of painting and cleaning. No angina or dyspnea.   Is not playing as much tennis - her tennis team has moved on.  2 of her partners have died , 1 is too unstable on her feet.   July 25, 2015:  Doing well.  Very active in her garden .  Plays tennis regularly . Plays on soft courts because of her back and knee pain .  July 28, 2016:  Susan [name] is seen today  Recent labs at [facility] reveals Chol = 155 Trigs = 113 HDL = 59 LDL = 73  She is upset today .   Her grandson OD'd on cough syrup He is doing to a rehab place today     Saw [name], NP on March 5 for palpitations.  Walking some.   Looking forward to getting back into tennis . No CP,  Palpitations have resolved.   Nov. 8, 2018  Doing well Still playing tennis  Plays on soft courts  No CP or dyspnea  Has occasional episodes of near syncope .  Most of the time when she is sitting down.   Last for a second or so.   Feels fine right after the episode  Has had 2 episodes over the past 6 months .   Did not pass out  Sep 22, 2017:  Doing well.   Not playing as much tennis now - most of her tennis friends have died. .    Has occasional DOE while playing tennis for a long point or walking up a hill.  No real chest pain   Past Medical History:  Diagnosis Date  . Coronary artery disease    POST PTCA AND STENTING OF HER RIGHT CORONARY ARTERY  . Hypothyroidism   . LAD stenosis    MODERATE 50-60% STENOSIS  . Osteoporosis     Past Surgical History:  Procedure Laterality Date  . fractured wrist repair    .  KNEE SURGERY     arthroscopic  torn meniscus  . LUMBAR LAMINECTOMY     years  ago  . ovarian cyst rupture and appy       Current Outpatient Medications  Medication Sig Dispense Refill  . Calcium Carbonate-Vitamin D (CALCIUM + D PO) Take by mouth 2 (two) times daily.      . clopidogrel (PLAVIX) 75 MG tablet TAKE 1 TABLET BY MOUTH ONCE DAILY 90 tablet 1  . fish oil-omega-3 fatty acids 1000 MG capsule Take by mouth daily.      Marland Kitchen levothyroxine (SYNTHROID) 50 MCG tablet Take 1 tablet (50 mcg total) by mouth daily before breakfast. 1 tablet   . multivitamin-lutein (OCUVITE-LUTEIN) CAPS capsule Take 1 capsule daily by mouth.    . nitroGLYCERIN (NITROSTAT) 0.4 MG SL tablet Place 1 tablet (0.4 mg total) under the tongue every 5 (five) minutes as needed. 25 tablet 3  . rosuvastatin (CRESTOR) 40 MG tablet TAKE 1 TABLET BY MOUTH ONCE DAILY 90 tablet 1  . venlafaxine XR (EFFEXOR-XR) 150 MG 24 hr capsule TAKE 1 CAPSULE BY MOUTH ONCE DAILY 90 capsule 2   . vitamin B-12 (CYANOCOBALAMIN) 1000 MCG tablet Take 1,000 mcg by mouth daily.       No current facility-administered medications for this visit.     Allergies:   Patient has no known allergies.    Social History:  The patient  reports that she has never smoked. She has never used smokeless tobacco. She reports that she drinks alcohol. She reports that she does not use drugs.   Family History:  The patient's family history includes Alzheimer's disease in her mother; Aneurysm in her sister; Heart attack in her father.    ROS:   Noted in current history, otherwise review of systems is negative.  Physical Exam: Blood pressure 118/80, pulse 71, height 5\' 3"  (1.6 m), weight 134 lb (60.8 kg), SpO2 99 %.  GEN:  Well nourished, well developed in no acute distress HEENT: Normal NECK: No JVD; No carotid bruits LYMPHATICS: No lymphadenopathy CARDIAC: RRR , no murmurs, rubs, gallops RESPIRATORY:  Clear to auscultation without rales, wheezing or rhonchi  ABDOMEN: Soft, non-tender, non-distended MUSCULOSKELETAL:  No edema; No deformity  SKIN: Warm and dry NEUROLOGIC:  Alert and oriented x 3   EKG:    Sep 22, 2017: Normal sinus rhythm at 71.  Left bundle branch block.  Recent Labs: No results found for requested labs within last 8760 hours.    Lipid Panel    Component Value Date/Time   CHOL 177 10/11/2013 1618   TRIG 277.0 (H) 10/11/2013 1618   HDL 50.30 10/11/2013 1618   CHOLHDL 4 10/11/2013 1618   VLDL 55.4 (H) 10/11/2013 1618   LDLCALC 71 10/11/2013 1618      Wt Readings from Last 3 Encounters:  09/22/17 134 lb (60.8 kg)  03/11/17 131 lb 6.4 oz (59.6 kg)  07/28/16 134 lb 1.9 oz (60.8 kg)      Other studies Reviewed: Additional studies/ records that were reviewed today include: . Review of the above records demonstrates:    ASSESSMENT AND PLAN:  1. Coronary artery disease-status post PTCA and stenting of her right coronary artery in 2005 . We placed a 2.75 x 16 mm  Taxus stent deployed at 18 atmospheres. She also has a moderate proximal LAD stenosis. Repeat cath by [facility] in 2012 .   2.  DOE:  Will get an echo Would have a low threshold to do a [employer]  myoview if she has an increase in symptoms .    2. Near syncope:   No recurrent symptoms   3. Hyperlipidemia-  Managed by Dr. Joylene Draft    Current medicines are reviewed at length with the patient today.  The patient does not have concerns regarding medicines.  The following changes have been made:  no change  Labs/ tests ordered today include:  No orders of the defined types were placed in this encounter.     Signed, Mertie Moores, MD  09/22/2017 11:31 AM    [facility] [place], [place], [place]  [address] Phone: [phone]; Fax: [phone]

## 2017-09-24 ENCOUNTER — Other Ambulatory Visit: Payer: Self-pay

## 2017-09-24 ENCOUNTER — Ambulatory Visit (HOSPITAL_COMMUNITY): Payer: Medicare Other | Attending: Cardiology

## 2017-09-24 DIAGNOSIS — I083 Combined rheumatic disorders of mitral, aortic and tricuspid valves: Secondary | ICD-10-CM | POA: Insufficient documentation

## 2017-09-24 DIAGNOSIS — R0600 Dyspnea, unspecified: Secondary | ICD-10-CM

## 2017-09-24 DIAGNOSIS — Z9861 Coronary angioplasty status: Secondary | ICD-10-CM | POA: Insufficient documentation

## 2017-09-24 DIAGNOSIS — I2510 Atherosclerotic heart disease of native coronary artery without angina pectoris: Secondary | ICD-10-CM | POA: Diagnosis not present

## 2017-09-24 DIAGNOSIS — E039 Hypothyroidism, unspecified: Secondary | ICD-10-CM | POA: Diagnosis not present

## 2017-09-24 DIAGNOSIS — R0609 Other forms of dyspnea: Secondary | ICD-10-CM

## 2017-09-24 DIAGNOSIS — E785 Hyperlipidemia, unspecified: Secondary | ICD-10-CM | POA: Diagnosis not present

## 2017-09-24 DIAGNOSIS — R55 Syncope and collapse: Secondary | ICD-10-CM | POA: Insufficient documentation

## 2017-09-28 ENCOUNTER — Telehealth: Payer: Self-pay | Admitting: Nurse Practitioner

## 2017-09-28 DIAGNOSIS — R0600 Dyspnea, unspecified: Secondary | ICD-10-CM

## 2017-09-28 DIAGNOSIS — R931 Abnormal findings on diagnostic imaging of heart and coronary circulation: Secondary | ICD-10-CM

## 2017-09-28 DIAGNOSIS — R0609 Other forms of dyspnea: Principal | ICD-10-CM

## 2017-09-28 NOTE — Telephone Encounter (Signed)
-----   Message from Thayer Headings, MD sent at 09/28/2017  5:54 AM EDT ----- EF has fallen from 55% to 30-35%. She has known CAD .  She will need a cath for further evaluation of this decreased EF Has LBBB.  If she does not have significant CAD to explain this fall in EF, she will need a referral to EP for consideration of bi-V pacer / ICD. Please start Losartan 25 mg a day following ( we can start the Losartan following cath )

## 2017-09-28 NOTE — Telephone Encounter (Signed)
Reviewed echo results and Dr. Elmarie Shiley advice with patient who verbalized understanding and agreement. She requests to be scheduled for cath on Friday June 7 and is aware that I will call her back with scheduling instructions and that Dr. Acie Fredrickson will call her to discuss risks and benefits of the procedure. I answered questions to her satisfaction and she thanked me for the call.

## 2017-09-28 NOTE — Telephone Encounter (Signed)
@  [place] OFFICE [address] [place] [address] Dept: [phone] Loc: [phone]  Susan [name]  09/28/2017  You are scheduled for a Cardiac Catheterization on Friday, June 7 with Dr. Harrell Gave End.  1. Please arrive at the [facility] (Main Entrance A) at [facility]: [place], [place] [address] at 8:00 AM (two hours before your procedure to ensure your preparation). Free valet parking service is available.   Special note: Every effort is made to have your procedure done on time. Please understand that emergencies sometimes delay scheduled procedures.  2. Diet: No solid food after midnight the night before. You may have clear liquids until 5 AM on the day of the procedure  3. Labs: You will need to have blood drawn on Wednesday, June 5 at [facility] at [facility]. [address]  Open: 7:30am - 5pm    Phone: [phone]. You do not need to be fasting.  4. Medication instructions in preparation for your procedure:   On the morning of your procedure, take your Plavix/Clopidogrel and ASPIRIN 81 MG and any morning medicines NOT listed above.  You may use sips of water.  5. Plan for one night stay--bring personal belongings. 6. Bring a current list of your medications and current insurance cards. 7. You MUST have a responsible person to drive you home. 8. Someone MUST be with you the first 24 hours after you arrive home or your discharge will be delayed. 9. Please wear clothes that are easy to get on and off and wear slip-on shoes.  Thank you for allowing [place] to care for you!   -- [facility]

## 2017-09-29 NOTE — Telephone Encounter (Signed)
I have discussed the risks, benefits, options of cardiac cath with possible PCI She understands and agrees to proceed.

## 2017-09-30 ENCOUNTER — Telehealth: Payer: Self-pay | Admitting: Cardiovascular Disease

## 2017-09-30 ENCOUNTER — Encounter: Payer: Self-pay | Admitting: Nurse Practitioner

## 2017-09-30 NOTE — Telephone Encounter (Signed)
Reviewed pre-cath instructions with patient who verbalized understanding. She is scheduled for lab appointment on Wed. June 5 and requests a copy of the instructions at that time. I advised her to ask for me on Wednesday and I will give her a printed copy. I advised that she may call back with additional questions or concerns prior to procedure. She thanked me for the call.

## 2017-09-30 NOTE — Telephone Encounter (Signed)
Called patient and advised her to continue Plavix, including an additional dose on the morning of the cardiac cath. See telephone encounter dated 5/28 for all pre-cath instructions. Patient thanked me for the call.

## 2017-09-30 NOTE — Telephone Encounter (Signed)
New Message:       Pt c/o medication issue:  1. Name of Medication: Plavix  2. How are you currently taking this medication (dosage and times per day)? clopidogrel (PLAVIX) 75 MG tablet  3. Are you having a reaction (difficulty breathing--STAT)? No  4. What is your medication issue? Pt states she is needing to know if she is supposed to stop her Plavix a week before her procedure on 6/7. Pt states to please leave it on her voicemail if she is unavailable.

## 2017-10-03 ENCOUNTER — Other Ambulatory Visit: Payer: Self-pay | Admitting: Cardiovascular Disease

## 2017-10-06 ENCOUNTER — Other Ambulatory Visit: Payer: Medicare Other | Admitting: *Deleted

## 2017-10-06 DIAGNOSIS — R931 Abnormal findings on diagnostic imaging of heart and coronary circulation: Secondary | ICD-10-CM | POA: Diagnosis not present

## 2017-10-06 DIAGNOSIS — R0609 Other forms of dyspnea: Secondary | ICD-10-CM | POA: Diagnosis not present

## 2017-10-06 DIAGNOSIS — R0600 Dyspnea, unspecified: Secondary | ICD-10-CM

## 2017-10-07 ENCOUNTER — Telehealth: Payer: Self-pay | Admitting: *Deleted

## 2017-10-07 LAB — BASIC METABOLIC PANEL
BUN/Creatinine Ratio: 12 (ref 12–28)
BUN: 10 mg/dL (ref 8–27)
CALCIUM: 9.8 mg/dL (ref 8.7–10.3)
CO2: 23 mmol/L (ref 20–29)
CREATININE: 0.86 mg/dL (ref 0.57–1.00)
Chloride: 103 mmol/L (ref 96–106)
GFR, EST AFRICAN AMERICAN: 73 mL/min/{1.73_m2} (ref >59)
GFR, EST NON AFRICAN AMERICAN: 64 mL/min/{1.73_m2} (ref >59)
Glucose: 81 mg/dL (ref 65–99)
Potassium: 4.3 mmol/L (ref 3.5–5.2)
Sodium: 141 mmol/L (ref 134–144)

## 2017-10-07 LAB — CBC
HEMATOCRIT: 43.5 % (ref 34.0–46.6)
HEMOGLOBIN: 14.3 g/dL (ref 11.1–15.9)
MCH: 31.8 pg (ref 26.6–33.0)
MCHC: 32.9 g/dL (ref 31.5–35.7)
MCV: 97 fL (ref 79–97)
Platelets: 258 10*3/uL (ref 150–450)
RBC: 4.49 x10E6/uL (ref 3.77–5.28)
RDW: 14.5 % (ref 12.3–15.4)
WBC: 8.2 10*3/uL (ref 3.4–10.8)

## 2017-10-07 NOTE — Telephone Encounter (Addendum)
Pt contacted pre-catheterization scheduled at [facility] for: Friday October 08, 2017 10:30 AM Verified arrival time and place: [place] Entrance A at: 8 AM  No solid food after midnight prior to cath, clear liquids until 5 AM day of procedure. Verified allergies in Epic Verified no diabetes medications.  AM meds can be  taken pre-cath with sip of water including: ASA 81 mg Clopidogrel 75 mg  Confirmed patient has responsible person to drive home post procedure and observe patient for 24 hours: yes  Dr End recommended  pre-cath IV rate of 2mL/hr, this has been ordered.

## 2017-10-08 ENCOUNTER — Encounter (HOSPITAL_COMMUNITY)
Admission: RE | Disposition: A | Discharge status: Home / Self Care | Payer: Self-pay | Source: Ambulatory Visit | Attending: Internal Medicine

## 2017-10-08 ENCOUNTER — Ambulatory Visit (HOSPITAL_COMMUNITY)
Admission: RE | Admit: 2017-10-08 | Discharge: 2017-10-08 | Disposition: A | Discharge status: Home / Self Care | Payer: Medicare Other | Source: Ambulatory Visit | Attending: Internal Medicine | Admitting: Internal Medicine

## 2017-10-08 DIAGNOSIS — Z955 Presence of coronary angioplasty implant and graft: Secondary | ICD-10-CM | POA: Insufficient documentation

## 2017-10-08 DIAGNOSIS — Z7901 Long term (current) use of anticoagulants: Secondary | ICD-10-CM | POA: Insufficient documentation

## 2017-10-08 DIAGNOSIS — Z8249 Family history of ischemic heart disease and other diseases of the circulatory system: Secondary | ICD-10-CM | POA: Diagnosis not present

## 2017-10-08 DIAGNOSIS — Z79899 Other long term (current) drug therapy: Secondary | ICD-10-CM | POA: Diagnosis not present

## 2017-10-08 DIAGNOSIS — E039 Hypothyroidism, unspecified: Secondary | ICD-10-CM | POA: Insufficient documentation

## 2017-10-08 DIAGNOSIS — I2510 Atherosclerotic heart disease of native coronary artery without angina pectoris: Secondary | ICD-10-CM

## 2017-10-08 DIAGNOSIS — I429 Cardiomyopathy, unspecified: Secondary | ICD-10-CM | POA: Diagnosis not present

## 2017-10-08 DIAGNOSIS — E785 Hyperlipidemia, unspecified: Secondary | ICD-10-CM | POA: Insufficient documentation

## 2017-10-08 DIAGNOSIS — R0600 Dyspnea, unspecified: Secondary | ICD-10-CM | POA: Diagnosis present

## 2017-10-08 DIAGNOSIS — M810 Age-related osteoporosis without current pathological fracture: Secondary | ICD-10-CM | POA: Diagnosis not present

## 2017-10-08 DIAGNOSIS — Z9889 Other specified postprocedural states: Secondary | ICD-10-CM | POA: Insufficient documentation

## 2017-10-08 DIAGNOSIS — I2584 Coronary atherosclerosis due to calcified coronary lesion: Secondary | ICD-10-CM | POA: Diagnosis not present

## 2017-10-08 DIAGNOSIS — H579 Unspecified disorder of eye and adnexa: Secondary | ICD-10-CM | POA: Diagnosis not present

## 2017-10-08 DIAGNOSIS — Z79890 Hormone replacement therapy (postmenopausal): Secondary | ICD-10-CM | POA: Diagnosis not present

## 2017-10-08 DIAGNOSIS — R0609 Other forms of dyspnea: Secondary | ICD-10-CM | POA: Insufficient documentation

## 2017-10-08 HISTORY — PX: RIGHT/LEFT HEART CATH AND CORONARY ANGIOGRAPHY: CATH118266

## 2017-10-08 LAB — POCT I-STAT 3, VENOUS BLOOD GAS (G3P V)
ACID-BASE DEFICIT: 2 mmol/L (ref 0.0–2.0)
ACID-BASE DEFICIT: 2 mmol/L (ref 0.0–2.0)
BICARBONATE: 23.1 mmol/L (ref 20.0–28.0)
Bicarbonate: 23.2 mmol/L (ref 20.0–28.0)
O2 SAT: 70 %
O2 SAT: 71 %
PO2 VEN: 39 mmHg (ref 32.0–45.0)
TCO2: 24 mmol/L (ref 22–32)
TCO2: 24 mmol/L (ref 22–32)
pCO2, Ven: 40.1 mmHg — ABNORMAL LOW (ref 44.0–60.0)
pCO2, Ven: 40.4 mmHg — ABNORMAL LOW (ref 44.0–60.0)
pH, Ven: 7.365 (ref 7.250–7.430)
pH, Ven: 7.369 (ref 7.250–7.430)
pO2, Ven: 38 mmHg (ref 32.0–45.0)

## 2017-10-08 LAB — POCT I-STAT 3, ART BLOOD GAS (G3+)
Acid-base deficit: 2 mmol/L (ref 0.0–2.0)
Bicarbonate: 21.6 mmol/L (ref 20.0–28.0)
O2 SAT: 95 %
PH ART: 7.414 (ref 7.350–7.450)
TCO2: 23 mmol/L (ref 22–32)
pCO2 arterial: 33.7 mmHg (ref 32.0–48.0)
pO2, Arterial: 76 mmHg — ABNORMAL LOW (ref 83.0–108.0)

## 2017-10-08 SURGERY — RIGHT/LEFT HEART CATH AND CORONARY ANGIOGRAPHY
Anesthesia: LOCAL

## 2017-10-08 MED ORDER — NITROGLYCERIN 1 MG/10 ML FOR IR/CATH LAB
INTRA_ARTERIAL | Status: DC | PRN
  Administered 2017-10-08: 200 mL via INTRACORONARY

## 2017-10-08 MED ORDER — IOHEXOL 350 MG/ML SOLN
INTRAVENOUS | Status: DC | PRN
  Administered 2017-10-08: 50 mL via INTRA_ARTERIAL

## 2017-10-08 MED ORDER — HEPARIN SODIUM (PORCINE) 1000 UNIT/ML IJ SOLN
INTRAMUSCULAR | Status: DC | PRN
  Administered 2017-10-08: 3000 [IU] via INTRAVENOUS

## 2017-10-08 MED ORDER — CARVEDILOL 3.125 MG PO TABS: 3.1250 mg | ORAL_TABLET | Freq: Two times a day (BID) | ORAL | 5 refills | Status: DC

## 2017-10-08 MED ORDER — FENTANYL CITRATE (PF) 100 MCG/2ML IJ SOLN
INTRAMUSCULAR | Status: AC
  Filled 2017-10-08: qty 2

## 2017-10-08 MED ORDER — SODIUM CHLORIDE 0.9% FLUSH: 3.0000 mL | INTRAVENOUS | Status: DC | PRN

## 2017-10-08 MED ORDER — HEPARIN (PORCINE) IN NACL 2-0.9 UNITS/ML
INTRAMUSCULAR | Status: AC | PRN
  Administered 2017-10-08: 500 mL

## 2017-10-08 MED ORDER — LIDOCAINE HCL (PF) 1 % IJ SOLN
INTRAMUSCULAR | Status: DC | PRN
  Administered 2017-10-08 (×2): 2 mL

## 2017-10-08 MED ORDER — MIDAZOLAM HCL 2 MG/2ML IJ SOLN
INTRAMUSCULAR | Status: AC
  Filled 2017-10-08: qty 2

## 2017-10-08 MED ORDER — FENTANYL CITRATE (PF) 100 MCG/2ML IJ SOLN
INTRAMUSCULAR | Status: DC | PRN
  Administered 2017-10-08: 25 ug via INTRAVENOUS

## 2017-10-08 MED ORDER — MIDAZOLAM HCL 2 MG/2ML IJ SOLN
INTRAMUSCULAR | Status: DC | PRN
  Administered 2017-10-08: 0.5 mg via INTRAVENOUS

## 2017-10-08 MED ORDER — SODIUM CHLORIDE 0.9 % IV SOLN
INTRAVENOUS | Status: DC
  Administered 2017-10-08: 09:00:00 via INTRAVENOUS

## 2017-10-08 MED ORDER — ASPIRIN 81 MG PO CHEW: 81.0000 mg | CHEWABLE_TABLET | ORAL | Status: DC

## 2017-10-08 MED ORDER — HEPARIN SODIUM (PORCINE) 1000 UNIT/ML IJ SOLN
INTRAMUSCULAR | Status: AC
  Filled 2017-10-08: qty 1

## 2017-10-08 MED ORDER — VERAPAMIL HCL 2.5 MG/ML IV SOLN
INTRAVENOUS | Status: DC | PRN
  Administered 2017-10-08: 11:00:00 via INTRA_ARTERIAL

## 2017-10-08 MED ORDER — SODIUM CHLORIDE 0.9 % IV SOLN: 250.0000 mL | INTRAVENOUS | Status: DC | PRN

## 2017-10-08 MED ORDER — LIDOCAINE HCL (PF) 1 % IJ SOLN
INTRAMUSCULAR | Status: AC
  Filled 2017-10-08: qty 30

## 2017-10-08 MED ORDER — HEPARIN (PORCINE) IN NACL 1000-0.9 UT/500ML-% IV SOLN
INTRAVENOUS | Status: AC
  Filled 2017-10-08: qty 1000

## 2017-10-08 MED ORDER — NITROGLYCERIN 1 MG/10 ML FOR IR/CATH LAB
INTRA_ARTERIAL | Status: AC
  Filled 2017-10-08: qty 10

## 2017-10-08 MED ORDER — VERAPAMIL HCL 2.5 MG/ML IV SOLN
INTRAVENOUS | Status: AC
  Filled 2017-10-08: qty 2

## 2017-10-08 MED ORDER — CLOPIDOGREL BISULFATE 75 MG PO TABS: 75.0000 mg | ORAL_TABLET | ORAL | Status: DC

## 2017-10-08 MED ORDER — SODIUM CHLORIDE 0.9% FLUSH: 3.0000 mL | Freq: Two times a day (BID) | INTRAVENOUS | Status: DC

## 2017-10-08 SURGICAL SUPPLY — 19 items
CATH BALLN WEDGE 5F 110CM (CATHETERS) ×1 IMPLANT
CATH INFINITI 5FR ANG PIGTAIL (CATHETERS) ×1 IMPLANT
CATH OPTITORQUE TIG 4.0 5F (CATHETERS) ×1 IMPLANT
COVER PRB 48X5XTLSCP FOLD TPE (BAG) IMPLANT
COVER PROBE 5X48 (BAG) ×2
DEVICE RAD TR BAND REGULAR (VASCULAR PRODUCTS) ×1 IMPLANT
ELECT DEFIB PAD ADLT CADENCE (PAD) ×1 IMPLANT
GUIDEWIRE INQWIRE 1.5J.035X260 (WIRE) IMPLANT
INQWIRE 1.5J .035X260CM (WIRE) ×2
KIT HEART LEFT (KITS) ×2 IMPLANT
NDL PERC 21GX4CM (NEEDLE) IMPLANT
NEEDLE PERC 21GX4CM (NEEDLE) ×2 IMPLANT
PACK CARDIAC CATHETERIZATION (CUSTOM PROCEDURE TRAY) ×2 IMPLANT
SHEATH RAIN 4/5FR (SHEATH) ×2 IMPLANT
SHEATH RAIN RADIAL 21G 6FR (SHEATH) ×1 IMPLANT
SYR MEDRAD MARK V 150ML (SYRINGE) ×2 IMPLANT
TRANSDUCER W/STOPCOCK (MISCELLANEOUS) ×2 IMPLANT
TUBING CIL FLEX 10 FLL-RA (TUBING) ×2 IMPLANT
WIRE HI TORQ VERSACORE-J 145CM (WIRE) ×1 IMPLANT

## 2017-10-08 NOTE — Interval H&P Note (Signed)
History and Physical Interval Note:  10/08/2017 9:40 AM  Susan [name]  has presented today for cardiac catheterization, with the diagnosis of dyspnea on exertion and cardiomyopathy. The various methods of treatment have been discussed with the patient and family. After consideration of risks, benefits and other options for treatment, the patient has consented to  Procedure(s): RIGHT/LEFT HEART CATH AND CORONARY ANGIOGRAPHY (N/A) as a surgical intervention .  The patient's history has been reviewed, patient examined, no change in status, stable for surgery.  I have reviewed the patient's chart and labs.  Questions were answered to the patient's satisfaction.     Cath Lab Visit (complete for each Cath Lab visit)  Clinical Evaluation Leading to the Procedure:   ACS: No.  Non-ACS:    Anginal/HF Classification: NYHA class II  Anti-ischemic medical therapy: No Therapy  Non-Invasive Test Results: No non-invasive testing performed; LVEF 30-35% by echo.  Prior CABG: No previous CABG  Franci Oshana

## 2017-10-08 NOTE — Brief Op Note (Signed)
Brief Cardiac Catheterization Note  Date: 10/08/2017 Time: 11:04 AM  PATIENT:  Susan [name]  84 y.o. female  PRE-OPERATIVE DIAGNOSIS:  Dyspnea on exertion and cardiomyopathy  POST-OPERATIVE DIAGNOSIS:  Same  PROCEDURE:  Procedure(s): RIGHT/LEFT HEART CATH AND CORONARY ANGIOGRAPHY (N/A)  SURGEON:  Surgeon(s) and Role:    * Ross Hefferan, MD - Primary  FINDINGS: 1. Moderate to severe mid LAD stenosis (60-70%), slightly worsen than prior catheterization in 2012. 2. Mild, non-obstructive LCx and RCA disease.  Mid and distal RCA stents are widely patent. 3. Severely reduced LVEF with global hypokinesis. 4. Normal left heart, right heart, and pulmonary artery pressures.  RECOMMENDATIONS: 1. Medical therapy.  Will add carvedilol 3.125 mg BID with plans for further optimization of evidence based HF therapy as an outpatient. 2. Aggressive secondary prevention of CAD. 3. Medical management of LAD disease, which is only slightly worse than in 2012.  Degree of cardiomyopathy is out of proportion to CAD.  Nelva Bush, MD [facility] Pager: [phone]

## 2017-10-08 NOTE — Discharge Instructions (Signed)
**Note Nollie Shiflett-identified via Obfuscation** Radial Site Care °Refer to this sheet in the next few weeks. These instructions provide you with information about caring for yourself after your procedure. Your health care provider may also give you more specific instructions. Your treatment has been planned according to current medical practices, but problems sometimes occur. Call your health care provider if you have any problems or questions after your procedure. °What can I expect after the procedure? °After your procedure, it is typical to have the following: °· Bruising at the radial site that usually fades within 1-2 weeks. °· Blood collecting in the tissue (hematoma) that may be painful to the touch. It should usually decrease in size and tenderness within 1-2 weeks. ° °Follow these instructions at home: °· Take medicines only as directed by your health care provider. °· You may shower 24-48 hours after the procedure or as directed by your health care provider. Remove the bandage (dressing) and gently wash the site with plain soap and water. Pat the area dry with a clean towel. Do not rub the site, because this may cause bleeding. °· Do not take baths, swim, or use a hot tub until your health care provider approves. °· Check your insertion site every day for redness, swelling, or drainage. °· Do not apply powder or lotion to the site. °· Do not flex or bend the affected arm for 24 hours or as directed by your health care provider. °· Do not push or pull heavy objects with the affected arm for 24 hours or as directed by your health care provider. °· Do not lift over 10 lb (4.5 kg) for 5 days after your procedure or as directed by your health care provider. °· Ask your health care provider when it is okay to: °? Return to work or school. °? Resume usual physical activities or sports. °? Resume sexual activity. °· Do not drive home if you are discharged the same day as the procedure. Have someone else drive you. °· You may drive 24 hours after the procedure  unless otherwise instructed by your health care provider. °· Do not operate machinery or power tools for 24 hours after the procedure. °· If your procedure was done as an outpatient procedure, which means that you went home the same day as your procedure, a responsible adult should be with you for the first 24 hours after you arrive home. °· Keep all follow-up visits as directed by your health care provider. This is important. °Contact a health care provider if: °· You have a fever. °· You have chills. °· You have increased bleeding from the radial site. Hold pressure on the site. °Get help right away if: °· You have unusual pain at the radial site. °· You have redness, warmth, or swelling at the radial site. °· You have drainage (other than a small amount of blood on the dressing) from the radial site. °· The radial site is bleeding, and the bleeding does not stop after 30 minutes of holding steady pressure on the site. °· Your arm or hand becomes pale, cool, tingly, or numb. °This information is not intended to replace advice given to you by your health care provider. Make sure you discuss any questions you have with your health care provider. °Document Released: 05/23/2010 Document Revised: 09/26/2015 Document Reviewed: 11/06/2013 °Elsevier Interactive Patient Education © 2018 [employer] ° °

## 2017-10-11 ENCOUNTER — Encounter (HOSPITAL_COMMUNITY): Payer: Self-pay | Admitting: Internal Medicine

## 2017-10-11 ENCOUNTER — Telehealth: Payer: Self-pay | Admitting: Cardiovascular Disease

## 2017-10-11 NOTE — Telephone Encounter (Signed)
See 10/11/17 phone note

## 2017-10-11 NOTE — Telephone Encounter (Signed)
We use ASA with plavix very frequently so it is a standard combination. However, since it is causing nose bleeds, it would be OK for her to hold the ASA I would be happy to write a letter for her.   I will be back in the office in Wednesday

## 2017-10-11 NOTE — Telephone Encounter (Signed)
New Message   Patient is calling because she recently had a heart cath. She advises that she was under the impression that she should not take aspirin. But they have taking one daily and yesterday she had a nose bleed.   Pt c/o medication issue:  1. Name of Medication: aspirin  2. How are you currently taking this medication (dosage and times per day)?   3. Are you having a reaction (difficulty breathing--STAT)? Nose bleed   4. What is your medication issue? Patient has developed a nose bleed. Please call.

## 2017-10-11 NOTE — Telephone Encounter (Signed)
Pt advised that she may hold Aspirin per Dr. Acie Fredrickson. Will mail her letter for her airline after Dr. Acie Fredrickson dictates it on Wed 10/13/17.

## 2017-10-11 NOTE — Telephone Encounter (Signed)
Spoke with the pt and she reports that she was discharged on 10/08/17 and was sent home on ASA 81mg . Pt reports that she has been told to hold ASA in the past since being on Plavix but was sent home on it and had a minor nose bleed in church yesterday. She is concerned since it has never been a "regular" med for her and would like Dr. Acie Fredrickson to let her know if he would like her to continue. I advised her that things may have changed based on her cath results.  Pt also wanting to know if okay to get a letter stating that she cannot travel due to medical reasons. She is trying to get 2 flight tickets refunded for her and her granddaughter. They were planning to leave this weekend for [place] but she is uncomfortable traveling with just a 84 year old with the addition of new med.. Coreg and her cath results. Advised her I would consult with Sharyn Lull / Dr. Acie Fredrickson and let her know any changes.

## 2017-10-13 ENCOUNTER — Encounter: Payer: Self-pay | Admitting: Nurse Practitioner

## 2017-10-13 NOTE — Telephone Encounter (Signed)
Left message for patient that letter has been completed and asked her to call back to let us know where to send it. Also advised f/u appointment is needed in the next few weeks per Dr. Acie Fredrickson

## 2017-10-14 NOTE — Telephone Encounter (Signed)
Follow up    Pt wanted to let you know she is going to come by and pick up the form.

## 2017-10-28 ENCOUNTER — Ambulatory Visit (INDEPENDENT_AMBULATORY_CARE_PROVIDER_SITE_OTHER): Payer: Medicare Other | Admitting: Cardiovascular Disease

## 2017-10-28 ENCOUNTER — Encounter: Payer: Self-pay | Admitting: Cardiovascular Disease

## 2017-10-28 ENCOUNTER — Encounter (INDEPENDENT_AMBULATORY_CARE_PROVIDER_SITE_OTHER): Payer: Self-pay

## 2017-10-28 VITALS — BP 102/68 | HR 68 | Ht 63.0 in | Wt 133.1 lb

## 2017-10-28 DIAGNOSIS — I2510 Atherosclerotic heart disease of native coronary artery without angina pectoris: Secondary | ICD-10-CM | POA: Diagnosis not present

## 2017-10-28 DIAGNOSIS — I779 Disorder of arteries and arterioles, unspecified: Secondary | ICD-10-CM | POA: Diagnosis not present

## 2017-10-28 DIAGNOSIS — R0989 Other specified symptoms and signs involving the circulatory and respiratory systems: Secondary | ICD-10-CM

## 2017-10-28 DIAGNOSIS — I739 Peripheral vascular disease, unspecified: Secondary | ICD-10-CM

## 2017-10-28 DIAGNOSIS — I447 Left bundle-branch block, unspecified: Secondary | ICD-10-CM | POA: Diagnosis not present

## 2017-10-28 NOTE — Progress Notes (Signed)
Cardiology Office Note   Date:  10/28/2017   ID:  Susan [name], Susan [name] 06-22-1933, MRN [id number]  PCP:  Crist Infante, MD  Cardiologist:   Mertie Moores, MD   Chief Complaint  Patient presents with  . Coronary Artery Disease   1. Coronary artery disease-status post PTCA and stenting of her right coronary artery. We placed a 2.75 x 16 mm Taxus stent deployed at 18 atmospheres. She also has a moderate proximal LAD stenosis. 2. Hypothyroidism 3. Hyperlipidemia-   Previous Notes:    Susan [name] is a 84 yo with a hx of CAD. She is still playing tennis regularly. She has not had any angina. She has some DOE.   October 26, 2012:  Susan [name] is doing well. She is still playing lots of tennis. No CP. She has had a lifeline screening - her carotid arteries are normal. She denies episodes of chest pain or shortness of breath.  October 11, 2013:  Susan [name] is not feeling well today. "Feels like she is jumping out of her skin" Got back from a trip , she found out that a friend had died.   Dec 29, 2013:  Susan [name] is doing ok. She has had some eye problems. Her eyesite is ok.  No angina. She does have some mild dyspnea if she climbs 3-4 flights of stairs. Still playing tennis regularly.    July 18, 2014:  Susan [name] is a 84 y.o. female who presents for follow up for her CAD. Just got back from a mission trip in [place]. Did lots of painting and cleaning. No angina or dyspnea.   Is not playing as much tennis - her tennis team has moved on.  2 of her partners have died , 1 is too unstable on her feet.   July 25, 2015:  Doing well.  Very active in her garden .  Plays tennis regularly . Plays on soft courts because of her back and knee pain .  July 28, 2016:  Susan [name] is seen today  Recent labs at [facility] reveals Chol = 155 Trigs = 113 HDL = 59 LDL = 73  She is upset today .   Her grandson OD'd on cough syrup He is doing to a rehab place today     Saw [name], NP on March 5 for palpitations.  Walking some.   Looking forward to getting back into tennis . No CP,  Palpitations have resolved.   Nov. 8, 2018  Doing well Still playing tennis  Plays on soft courts  No CP or dyspnea  Has occasional episodes of near syncope .  Most of the time when she is sitting down.   Last for a second or so.   Feels fine right after the episode  Has had 2 episodes over the past 6 months .   Did not pass out  Sep 22, 2017:  Doing well.   Not playing as much tennis now - most of her tennis friends have died. .    Has occasional DOE while playing tennis for a long point or walking up a hill.  No real chest pain   October 28, 2017:  Susan [name] is seen back today for follow-up visit.  She has a history of coronary artery disease and coronary stenting.  She was having some episodes of chest pain when I last saw her in May.  Repeat heart catheterization revealed a moderate to severe mid LAD stenosis of 60 to 70%.  This  is slightly worse than it was in her heart catheterization in 2012.  She was found to have severely reduced left ventricular systolic function.  She was started on carvedilol 3.125 mg twice a day.  Past Medical History:  Diagnosis Date  . Coronary artery disease    POST PTCA AND STENTING OF HER RIGHT CORONARY ARTERY  . Hypothyroidism   . LAD stenosis    MODERATE 50-60% STENOSIS  . Osteoporosis     Past Surgical History:  Procedure Laterality Date  . fractured wrist repair    . KNEE SURGERY     arthroscopic  torn meniscus  . LUMBAR LAMINECTOMY     years  ago  . ovarian cyst rupture and appy    . RIGHT/LEFT HEART CATH AND CORONARY ANGIOGRAPHY N/A 10/08/2017   Procedure: RIGHT/LEFT HEART CATH AND CORONARY ANGIOGRAPHY;  Surgeon: Nelva Bush, MD;  Location: [facility];  Service: Cardiovascular;  Laterality: N/A;     Current Outpatient Medications  Medication Sig Dispense Refill  . Calcium Carbonate-Vitamin D  (CALCIUM + D PO) Take 1 tablet by mouth at bedtime.     . carvedilol (COREG) 3.125 MG tablet Take 1 tablet (3.125 mg total) by mouth 2 (two) times daily. 60 tablet 5  . Cholecalciferol (VITAMIN D3) 2000 units TABS Take 2,000 Units by mouth daily.    . clopidogrel (PLAVIX) 75 MG tablet TAKE 1 TABLET BY MOUTH ONCE DAILY 90 tablet 3  . levothyroxine (SYNTHROID) 50 MCG tablet Take 1 tablet (50 mcg total) by mouth daily before breakfast. 1 tablet   . multivitamin-lutein (OCUVITE-LUTEIN) CAPS capsule Take 1 capsule daily by mouth.    . nitroGLYCERIN (NITROSTAT) 0.4 MG SL tablet Place 1 tablet (0.4 mg total) under the tongue every 5 (five) minutes as needed. 25 tablet 3  . Omega-3 Fatty Acids (FISH OIL) 1200 MG CAPS Take 1,200 mg by mouth at bedtime.    . rosuvastatin (CRESTOR) 40 MG tablet TAKE 1 TABLET BY MOUTH ONCE DAILY 90 tablet 1  . venlafaxine XR (EFFEXOR-XR) 150 MG 24 hr capsule TAKE 1 CAPSULE BY MOUTH ONCE DAILY 90 capsule 2   No current facility-administered medications for this visit.     Allergies:   Patient has no known allergies.    Social History:  The patient  reports that she has never smoked. She has never used smokeless tobacco. She reports that she drinks alcohol. She reports that she does not use drugs.   Family History:  The patient's family history includes Alzheimer's disease in her mother; Aneurysm in her sister; Heart attack in her father.    ROS:    Noted in current history, otherwise review of systems is negative.  Physical Exam: Blood pressure 102/68, pulse 68, height 5\' 3"  (1.6 m), weight 133 lb 1.9 oz (60.4 kg), SpO2 98 %.  GEN:  Well nourished, well developed in no acute distress HEENT: Normal NECK: No JVD; No carotid bruits LYMPHATICS: No lymphadenopathy CARDIAC: RRR RESPIRATORY:  Clear to auscultation without rales, wheezing or rhonchi  ABDOMEN: Soft, non-tender, non-distended MUSCULOSKELETAL:  No edema; No deformity  SKIN: Warm and dry NEUROLOGIC:   Alert and oriented x 3    EKG:      Recent Labs: 10/06/2017: BUN 10; Creatinine, Ser 0.86; Hemoglobin 14.3; Platelets 258; Potassium 4.3; Sodium 141    Lipid Panel    Component Value Date/Time   CHOL 177 10/11/2013 1618   TRIG 277.0 (H) 10/11/2013 1618   HDL 50.30 10/11/2013 1618  CHOLHDL 4 10/11/2013 1618   VLDL 55.4 (H) 10/11/2013 1618   LDLCALC 71 10/11/2013 1618      Wt Readings from Last 3 Encounters:  10/28/17 133 lb 1.9 oz (60.4 kg)  10/08/17 129 lb (58.5 kg)  09/22/17 134 lb (60.8 kg)      Other studies Reviewed: Additional studies/ records that were reviewed today include: . Review of the above records demonstrates:    ASSESSMENT AND PLAN:  1. Coronary artery disease-status post PTCA and stenting of her right coronary artery in 2005 . We placed a 2.75 x 16 mm Taxus stent deployed at 18 atmospheres. She also has a moderate proximal LAD stenosis. Repeat cath by [facility] in 2012 . Recent heart catheterization revealed patent stents in the right coronary artery.  Her LAD stenosis is perhaps slightly worse than it was in 2012 but not significantly so. At this point it appears that she is relatively asymptomatic.  I do not think that her reduction in LV function is due to this moderate to severe proximal/mid LAD stenosis.  We will continue to follow this.  2.  Chronic systolic CHF:   - EF 93-23%.   Has a LBBB  On Coreg 3. 125 BID .   BP and heart rate are about as low as she can tolerate.  We will not be able to increase her medications at this time. We will get another echocardiogram in about 2 months.  If her ejection fraction has remained in the 30 to 35% range I think that she will need to be referred to EP for consideration for biventricular pacing.  3.  Carotid artery disease: She had moderate carotid artery disease by carotid ultrasound in 2013.  She is not having any symptoms.  We will repeat carotid duplex scan.    3. Hyperlipidemia-  Managed by primary MD     Current medicines are reviewed at length with the patient today.  The patient does not have concerns regarding medicines.  The following changes have been made:  no change  Labs/ tests ordered today include:   Orders Placed This Encounter  Procedures  . ECHOCARDIOGRAM COMPLETE  - Carotid duplex scan      Signed, Mertie Moores, MD  10/28/2017 10:54 AM    [facility] [place], [place], [place]  [address] Phone: [phone]; Fax: [phone]

## 2017-10-28 NOTE — Patient Instructions (Signed)
Medication Instructions: Your physician recommends that you continue on your current medications as directed. Please refer to the Current Medication list given to you today.  Labwork: None Ordered  Procedures/Testing: Your physician has requested that you have an echocardiogram (in 2 months). Echocardiography is a painless test that uses sound waves to create images of your heart. It provides your doctor with information about the size and shape of your heart and how well your heart's chambers and valves are working. This procedure takes approximately one hour. There are no restrictions for this procedure.  Your physician has requested that you have a carotid duplex. This test is an ultrasound of the carotid arteries in your neck. It looks at blood flow through these arteries that supply the brain with blood. Allow one hour for this exam. There are no restrictions or special instructions.  Follow-Up: Your physician recommends that you schedule a follow-up appointment in: 3 MONTHS with Dr. Acie Fredrickson.   If you need a refill on your cardiac medications before your next appointment, please call your pharmacy.

## 2017-10-29 ENCOUNTER — Other Ambulatory Visit: Payer: Self-pay | Admitting: Cardiovascular Disease

## 2017-10-29 DIAGNOSIS — I6523 Occlusion and stenosis of bilateral carotid arteries: Secondary | ICD-10-CM

## 2017-11-09 ENCOUNTER — Ambulatory Visit (HOSPITAL_COMMUNITY)
Admission: RE | Admit: 2017-11-09 | Discharge: 2017-11-09 | Disposition: A | Discharge status: Home / Self Care | Payer: Medicare Other | Source: Ambulatory Visit | Attending: Cardiovascular Disease | Admitting: Cardiovascular Disease

## 2017-11-09 DIAGNOSIS — I6523 Occlusion and stenosis of bilateral carotid arteries: Secondary | ICD-10-CM | POA: Diagnosis not present

## 2017-12-08 DIAGNOSIS — E038 Other specified hypothyroidism: Secondary | ICD-10-CM | POA: Diagnosis not present

## 2017-12-10 ENCOUNTER — Telehealth: Payer: Self-pay | Admitting: Cardiovascular Disease

## 2017-12-10 NOTE — Telephone Encounter (Signed)
New Message:      Pt states she would like to know if it is okay for her to go to [facility]. She states the elevation is much higher there but she states she feels fine. Pt states it is fine to leave it on her voicemail if she does not answer.

## 2017-12-10 NOTE — Telephone Encounter (Signed)
Left detailed message for patient that per Dr. Acie Fredrickson, she may travel to [place]. I advised her to stay well hydrated and stay on her medications and to enjoy her time there. I advised she may call back with additional questions or concerns.

## 2017-12-17 ENCOUNTER — Other Ambulatory Visit: Payer: Self-pay | Admitting: Internal Medicine

## 2017-12-17 DIAGNOSIS — D352 Benign neoplasm of pituitary gland: Secondary | ICD-10-CM

## 2017-12-31 ENCOUNTER — Encounter (INDEPENDENT_AMBULATORY_CARE_PROVIDER_SITE_OTHER): Payer: Self-pay

## 2017-12-31 ENCOUNTER — Ambulatory Visit (HOSPITAL_COMMUNITY): Payer: Medicare Other | Attending: Cardiology

## 2017-12-31 ENCOUNTER — Other Ambulatory Visit: Payer: Self-pay

## 2017-12-31 ENCOUNTER — Telehealth: Payer: Self-pay | Admitting: Nurse Practitioner

## 2017-12-31 DIAGNOSIS — I2510 Atherosclerotic heart disease of native coronary artery without angina pectoris: Secondary | ICD-10-CM | POA: Diagnosis not present

## 2017-12-31 DIAGNOSIS — I77810 Thoracic aortic ectasia: Secondary | ICD-10-CM | POA: Diagnosis not present

## 2017-12-31 DIAGNOSIS — I088 Other rheumatic multiple valve diseases: Secondary | ICD-10-CM | POA: Diagnosis not present

## 2017-12-31 DIAGNOSIS — I447 Left bundle-branch block, unspecified: Secondary | ICD-10-CM | POA: Insufficient documentation

## 2017-12-31 DIAGNOSIS — R0989 Other specified symptoms and signs involving the circulatory and respiratory systems: Secondary | ICD-10-CM | POA: Diagnosis not present

## 2017-12-31 DIAGNOSIS — I5022 Chronic systolic (congestive) heart failure: Secondary | ICD-10-CM

## 2017-12-31 DIAGNOSIS — I080 Rheumatic disorders of both mitral and aortic valves: Secondary | ICD-10-CM | POA: Insufficient documentation

## 2017-12-31 NOTE — Telephone Encounter (Signed)
Left detailed message for patient to call back and that we will proceed with EP consult as was discussed with her at her office visit with Dr. Acie Fredrickson. Referral placed for EP and message to scheduling

## 2017-12-31 NOTE — Telephone Encounter (Signed)
-----   Message from Thayer Headings, MD sent at 12/31/2017  4:38 PM EDT ----- EF remains low Her relatively low BP prohibits [place] from adding additional meds.  Please refer to EP for consideration for BI-V pacer  ICD

## 2018-01-02 ENCOUNTER — Other Ambulatory Visit: Payer: Self-pay | Admitting: Cardiovascular Disease

## 2018-01-04 ENCOUNTER — Ambulatory Visit
Admission: RE | Admit: 2018-01-04 | Discharge: 2018-01-04 | Disposition: A | Discharge status: Home / Self Care | Payer: Medicare Other | Source: Ambulatory Visit | Attending: Internal Medicine | Admitting: Internal Medicine

## 2018-01-04 DIAGNOSIS — D352 Benign neoplasm of pituitary gland: Secondary | ICD-10-CM

## 2018-01-04 DIAGNOSIS — Z86018 Personal history of other benign neoplasm: Secondary | ICD-10-CM | POA: Diagnosis not present

## 2018-01-04 MED ORDER — GADOBENATE DIMEGLUMINE 529 MG/ML IV SOLN
9.0000 mL | Freq: Once | INTRAVENOUS | Status: AC | PRN
  Administered 2018-01-04: 9 mL via INTRAVENOUS

## 2018-01-05 NOTE — Telephone Encounter (Signed)
Left message for patient to call back to discuss treatment plan

## 2018-01-05 NOTE — Telephone Encounter (Signed)
Reviewed results of echo and plan of care with patient who verbalized understanding and agreement. She is aware of appointment with Dr. Lovena Le on 9/24 and thanked me for our help.

## 2018-01-17 DIAGNOSIS — M810 Age-related osteoporosis without current pathological fracture: Secondary | ICD-10-CM | POA: Diagnosis not present

## 2018-01-25 ENCOUNTER — Encounter: Payer: Self-pay | Admitting: Internal Medicine

## 2018-01-25 ENCOUNTER — Ambulatory Visit: Payer: Medicare Other | Admitting: Internal Medicine

## 2018-01-25 VITALS — BP 116/70 | HR 77 | Ht 63.0 in | Wt 130.6 lb

## 2018-01-25 DIAGNOSIS — I447 Left bundle-branch block, unspecified: Secondary | ICD-10-CM

## 2018-01-25 DIAGNOSIS — I5022 Chronic systolic (congestive) heart failure: Secondary | ICD-10-CM

## 2018-01-25 NOTE — Patient Instructions (Addendum)
Medication Instructions:  Your physician recommends that you continue on your current medications as directed. Please refer to the Current Medication list given to you today.  Labwork: None ordered.  Testing/Procedures: None ordered.  Follow-Up: Your physician wants you to follow-up in: as needed with Dr. Taylor.      Any Other Special Instructions Will Be Listed Below (If Applicable).  If you need a refill on your cardiac medications before your next appointment, please call your pharmacy.   

## 2018-01-25 NOTE — Progress Notes (Signed)
HPI Susan [name] is referred today by Dr. Acie Fredrickson for consideration for biv PPM insertion. She is a pleasant elderly woman with a h/o CAD, chronic systolic LV dysfunction, Carotid vascular disease, and dyslipidemia. She was noted to have LV dysfunction by echo. She has had long standing LBBB although her QRS duration is only around 130 ms. She remains very active and denies any limitation. She states that she works in her yard, mows her own grass and plays tennis. No syncope. She denies anginal symptoms. She is on good medical therapy for her CAD and LV dysfunction.  No Known Allergies   Current Outpatient Medications  Medication Sig Dispense Refill  . Calcium Carbonate-Vitamin D (CALCIUM + D PO) Take 1 tablet by mouth at bedtime.     . carvedilol (COREG) 3.125 MG tablet Take 1 tablet (3.125 mg total) by mouth 2 (two) times daily. 60 tablet 5  . Cholecalciferol (VITAMIN D3) 2000 units TABS Take 2,000 Units by mouth daily.    . clopidogrel (PLAVIX) 75 MG tablet TAKE 1 TABLET BY MOUTH ONCE DAILY 90 tablet 3  . levothyroxine (SYNTHROID, LEVOTHROID) 75 MCG tablet Take 1 tablet by mouth daily.  0  . multivitamin-lutein (OCUVITE-LUTEIN) CAPS capsule Take 1 capsule daily by mouth.    . nitroGLYCERIN (NITROSTAT) 0.4 MG SL tablet Place 1 tablet (0.4 mg total) under the tongue every 5 (five) minutes as needed. 25 tablet 3  . Omega-3 Fatty Acids (FISH OIL) 1200 MG CAPS Take 1,200 mg by mouth at bedtime.    . rosuvastatin (CRESTOR) 40 MG tablet TAKE 1 TABLET BY MOUTH ONCE DAILY 90 tablet 3  . venlafaxine XR (EFFEXOR-XR) 150 MG 24 hr capsule TAKE 1 CAPSULE BY MOUTH ONCE DAILY 90 capsule 2  . vitamin B-12 (CYANOCOBALAMIN) 1000 MCG tablet Take 2 tablets by mouth daily.     No current facility-administered medications for this visit.      Past Medical History:  Diagnosis Date  . Coronary artery disease    POST PTCA AND STENTING OF HER RIGHT CORONARY ARTERY  . Hypothyroidism   . LAD stenosis    MODERATE 50-60% STENOSIS  . Osteoporosis     ROS:   All systems reviewed and negative except as noted in the HPI.   Past Surgical History:  Procedure Laterality Date  . fractured wrist repair    . KNEE SURGERY     arthroscopic  torn meniscus  . LUMBAR LAMINECTOMY     years  ago  . ovarian cyst rupture and appy    . RIGHT/LEFT HEART CATH AND CORONARY ANGIOGRAPHY N/A 10/08/2017   Procedure: RIGHT/LEFT HEART CATH AND CORONARY ANGIOGRAPHY;  Surgeon: Nelva Bush, MD;  Location: [facility];  Service: Cardiovascular;  Laterality: N/A;     Family History  Problem Relation Age of Onset  . Alzheimer's disease Mother   . Heart attack Father   . Aneurysm Sister      Social History   Socioeconomic History  . Marital status: Widowed    Spouse name: Not on file  . Number of children: Not on file  . Years of education: Not on file  . Highest education level: Not on file  Occupational History  . Not on file  Social Needs  . Financial resource strain: Not on file  . Food insecurity:    Worry: Not on file    Inability: Not on file  . Transportation needs:    Medical: Not on file  Non-medical: Not on file  Tobacco Use  . Smoking status: Never Smoker  . Smokeless tobacco: Never Used  Substance and Sexual Activity  . Alcohol use: Yes    Comment: wine occasional  . Drug use: No  . Sexual activity: Not on file  Lifestyle  . Physical activity:    Days per week: Not on file    Minutes per session: Not on file  . Stress: Not on file  Relationships  . Social connections:    Talks on phone: Not on file    Gets together: Not on file    Attends religious service: Not on file    Active member of club or organization: Not on file    Attends meetings of clubs or organizations: Not on file    Relationship status: Not on file  . Intimate partner violence:    Fear of current or ex partner: Not on file    Emotionally abused: Not on file    Physically abused: Not on file     Forced sexual activity: Not on file  Other Topics Concern  . Not on file  Social History Narrative  . Not on file     BP 116/70   Pulse 77   Ht 5\' 3"  (1.6 m)   Wt 130 lb 9.6 oz (59.2 kg)   SpO2 97%   BMI 23.13 kg/m   Physical Exam:  Well appearing NAD HEENT: Unremarkable Neck:  No JVD, no thyromegally Lymphatics:  No adenopathy Back:  No CVA tenderness Lungs:  Clear with no wheezes HEART:  Regular rate rhythm, no murmurs, no rubs, no clicks Abd:  soft, positive bowel sounds, no organomegally, no rebound, no guarding Ext:  2 plus pulses, no edema, no cyanosis, no clubbing Skin:  No rashes no nodules Neuro:  CN II through XII intact, motor grossly intact  EKG - nsr with LBBB  Assess/Plan: 1. Chronic systolic heart failure - she has marked LV dysfunction and LBBB but her QRS is only about 130 and her symptoms are really class 1. As such, there is no indication for BiV PPM insertion. If she were to delevelop syncope or worsening CHF then a Biv PPM would be a strong consideration. If her QRS were to widen to over 150 or she develop symptomatic heart block, then PPM insertion would be a consideration. With her advanced age, I do not think she is a candidate for prophylactic ICD insertion.  2. Carotid vascular disease - she is asymptomatic. 3. Dyslipidemia - she is tolerating her crestor.  Mikle Bosworth.D.

## 2018-01-29 DIAGNOSIS — Z23 Encounter for immunization: Secondary | ICD-10-CM | POA: Diagnosis not present

## 2018-01-31 ENCOUNTER — Emergency Department (HOSPITAL_COMMUNITY): Payer: Medicare Other

## 2018-01-31 ENCOUNTER — Inpatient Hospital Stay (HOSPITAL_COMMUNITY)
Admission: EM | Admit: 2018-01-31 | Discharge: 2018-02-11 | DRG: 516 | Disposition: A | Discharge status: Skilled Nursing Facility | Payer: Medicare Other | Attending: Internal Medicine | Admitting: Internal Medicine

## 2018-01-31 ENCOUNTER — Encounter (HOSPITAL_COMMUNITY): Payer: Self-pay

## 2018-01-31 DIAGNOSIS — I447 Left bundle-branch block, unspecified: Secondary | ICD-10-CM | POA: Diagnosis not present

## 2018-01-31 DIAGNOSIS — W01190A Fall on same level from slipping, tripping and stumbling with subsequent striking against furniture, initial encounter: Secondary | ICD-10-CM | POA: Diagnosis present

## 2018-01-31 DIAGNOSIS — M6281 Muscle weakness (generalized): Secondary | ICD-10-CM | POA: Diagnosis not present

## 2018-01-31 DIAGNOSIS — E039 Hypothyroidism, unspecified: Secondary | ICD-10-CM | POA: Diagnosis not present

## 2018-01-31 DIAGNOSIS — Z4789 Encounter for other orthopedic aftercare: Secondary | ICD-10-CM | POA: Diagnosis not present

## 2018-01-31 DIAGNOSIS — S0990XA Unspecified injury of head, initial encounter: Secondary | ICD-10-CM | POA: Diagnosis not present

## 2018-01-31 DIAGNOSIS — E876 Hypokalemia: Secondary | ICD-10-CM | POA: Diagnosis not present

## 2018-01-31 DIAGNOSIS — R0781 Pleurodynia: Secondary | ICD-10-CM | POA: Diagnosis not present

## 2018-01-31 DIAGNOSIS — T1490XA Injury, unspecified, initial encounter: Secondary | ICD-10-CM

## 2018-01-31 DIAGNOSIS — Z79899 Other long term (current) drug therapy: Secondary | ICD-10-CM

## 2018-01-31 DIAGNOSIS — I2510 Atherosclerotic heart disease of native coronary artery without angina pectoris: Secondary | ICD-10-CM | POA: Diagnosis not present

## 2018-01-31 DIAGNOSIS — E785 Hyperlipidemia, unspecified: Secondary | ICD-10-CM | POA: Diagnosis not present

## 2018-01-31 DIAGNOSIS — Z7902 Long term (current) use of antithrombotics/antiplatelets: Secondary | ICD-10-CM | POA: Diagnosis not present

## 2018-01-31 DIAGNOSIS — R079 Chest pain, unspecified: Secondary | ICD-10-CM | POA: Diagnosis not present

## 2018-01-31 DIAGNOSIS — S299XXA Unspecified injury of thorax, initial encounter: Secondary | ICD-10-CM | POA: Diagnosis not present

## 2018-01-31 DIAGNOSIS — R5381 Other malaise: Secondary | ICD-10-CM | POA: Diagnosis not present

## 2018-01-31 DIAGNOSIS — S22050A Wedge compression fracture of T5-T6 vertebra, initial encounter for closed fracture: Secondary | ICD-10-CM | POA: Diagnosis present

## 2018-01-31 DIAGNOSIS — R600 Localized edema: Secondary | ICD-10-CM | POA: Diagnosis not present

## 2018-01-31 DIAGNOSIS — S22059A Unspecified fracture of T5-T6 vertebra, initial encounter for closed fracture: Secondary | ICD-10-CM | POA: Diagnosis not present

## 2018-01-31 DIAGNOSIS — I429 Cardiomyopathy, unspecified: Secondary | ICD-10-CM

## 2018-01-31 DIAGNOSIS — Z743 Need for continuous supervision: Secondary | ICD-10-CM | POA: Diagnosis not present

## 2018-01-31 DIAGNOSIS — R0789 Other chest pain: Secondary | ICD-10-CM | POA: Diagnosis not present

## 2018-01-31 DIAGNOSIS — R279 Unspecified lack of coordination: Secondary | ICD-10-CM | POA: Diagnosis not present

## 2018-01-31 DIAGNOSIS — R2689 Other abnormalities of gait and mobility: Secondary | ICD-10-CM | POA: Diagnosis not present

## 2018-01-31 DIAGNOSIS — Z955 Presence of coronary angioplasty implant and graft: Secondary | ICD-10-CM

## 2018-01-31 DIAGNOSIS — E782 Mixed hyperlipidemia: Secondary | ICD-10-CM | POA: Diagnosis not present

## 2018-01-31 DIAGNOSIS — Y9301 Activity, walking, marching and hiking: Secondary | ICD-10-CM | POA: Diagnosis present

## 2018-01-31 DIAGNOSIS — G43901 Migraine, unspecified, not intractable, with status migrainosus: Secondary | ICD-10-CM | POA: Diagnosis not present

## 2018-01-31 DIAGNOSIS — S22000A Wedge compression fracture of unspecified thoracic vertebra, initial encounter for closed fracture: Secondary | ICD-10-CM

## 2018-01-31 DIAGNOSIS — I5022 Chronic systolic (congestive) heart failure: Secondary | ICD-10-CM | POA: Diagnosis present

## 2018-01-31 DIAGNOSIS — S22050K Wedge compression fracture of T5-T6 vertebra, subsequent encounter for fracture with nonunion: Secondary | ICD-10-CM | POA: Diagnosis not present

## 2018-01-31 DIAGNOSIS — S3991XA Unspecified injury of abdomen, initial encounter: Secondary | ICD-10-CM | POA: Diagnosis not present

## 2018-01-31 DIAGNOSIS — I110 Hypertensive heart disease with heart failure: Secondary | ICD-10-CM | POA: Diagnosis present

## 2018-01-31 DIAGNOSIS — G43909 Migraine, unspecified, not intractable, without status migrainosus: Secondary | ICD-10-CM | POA: Diagnosis not present

## 2018-01-31 DIAGNOSIS — M810 Age-related osteoporosis without current pathological fracture: Secondary | ICD-10-CM | POA: Diagnosis present

## 2018-01-31 DIAGNOSIS — M4854XA Collapsed vertebra, not elsewhere classified, thoracic region, initial encounter for fracture: Secondary | ICD-10-CM | POA: Diagnosis not present

## 2018-01-31 DIAGNOSIS — W19XXXA Unspecified fall, initial encounter: Secondary | ICD-10-CM | POA: Diagnosis not present

## 2018-01-31 DIAGNOSIS — S22050S Wedge compression fracture of T5-T6 vertebra, sequela: Secondary | ICD-10-CM | POA: Diagnosis not present

## 2018-01-31 DIAGNOSIS — R064 Hyperventilation: Secondary | ICD-10-CM | POA: Diagnosis not present

## 2018-01-31 DIAGNOSIS — R2681 Unsteadiness on feet: Secondary | ICD-10-CM | POA: Diagnosis not present

## 2018-01-31 DIAGNOSIS — Z741 Need for assistance with personal care: Secondary | ICD-10-CM | POA: Diagnosis not present

## 2018-01-31 DIAGNOSIS — S199XXA Unspecified injury of neck, initial encounter: Secondary | ICD-10-CM | POA: Diagnosis not present

## 2018-01-31 LAB — CBC WITH DIFFERENTIAL/PLATELET
Abs Immature Granulocytes: 0 10*3/uL (ref 0.0–0.1)
BASOS ABS: 0 10*3/uL (ref 0.0–0.1)
BASOS PCT: 0 %
EOS PCT: 1 %
Eosinophils Absolute: 0.1 10*3/uL (ref 0.0–0.7)
HCT: 37 % (ref 36.0–46.0)
Hemoglobin: 12.2 g/dL (ref 12.0–15.0)
Immature Granulocytes: 1 %
Lymphocytes Relative: 29 %
Lymphs Abs: 2.2 10*3/uL (ref 0.7–4.0)
MCH: 32.5 pg (ref 26.0–34.0)
MCHC: 33 g/dL (ref 30.0–36.0)
MCV: 98.7 fL (ref 78.0–100.0)
MONO ABS: 0.4 10*3/uL (ref 0.1–1.0)
Monocytes Relative: 6 %
NEUTROS PCT: 63 %
Neutro Abs: 4.8 10*3/uL (ref 1.7–7.7)
PLATELETS: 183 10*3/uL (ref 150–400)
RBC: 3.75 MIL/uL — AB (ref 3.87–5.11)
RDW: 14 % (ref 11.5–15.5)
WBC: 7.6 10*3/uL (ref 4.0–10.5)

## 2018-01-31 LAB — I-STAT CHEM 8, ED
BUN: 16 mg/dL (ref 8–23)
CALCIUM ION: 1.05 mmol/L — AB (ref 1.15–1.40)
CHLORIDE: 107 mmol/L (ref 98–111)
Creatinine, Ser: 0.7 mg/dL (ref 0.44–1.00)
GLUCOSE: 93 mg/dL (ref 70–99)
HCT: 37 % (ref 36.0–46.0)
Hemoglobin: 12.6 g/dL (ref 12.0–15.0)
POTASSIUM: 3.1 mmol/L — AB (ref 3.5–5.1)
Sodium: 140 mmol/L (ref 135–145)
TCO2: 20 mmol/L — ABNORMAL LOW (ref 22–32)

## 2018-01-31 LAB — COMPREHENSIVE METABOLIC PANEL
ALT: 16 U/L (ref 0–44)
AST: 23 U/L (ref 15–41)
Albumin: 3.4 g/dL — ABNORMAL LOW (ref 3.5–5.0)
Alkaline Phosphatase: 53 U/L (ref 38–126)
Anion gap: 8 (ref 5–15)
BUN: 15 mg/dL (ref 8–23)
CALCIUM: 8.3 mg/dL — AB (ref 8.9–10.3)
CO2: 20 mmol/L — AB (ref 22–32)
Chloride: 109 mmol/L (ref 98–111)
Creatinine, Ser: 0.84 mg/dL (ref 0.44–1.00)
GFR calc non Af Amer: >60 mL/min (ref >60)
Glucose, Bld: 101 mg/dL — ABNORMAL HIGH (ref 70–99)
Potassium: 3.1 mmol/L — ABNORMAL LOW (ref 3.5–5.1)
SODIUM: 137 mmol/L (ref 135–145)
Total Bilirubin: 0.3 mg/dL (ref 0.3–1.2)
Total Protein: 5.9 g/dL — ABNORMAL LOW (ref 6.5–8.1)

## 2018-01-31 LAB — I-STAT TROPONIN, ED: TROPONIN I, POC: 0 ng/mL (ref 0.00–0.08)

## 2018-01-31 MED ORDER — MORPHINE SULFATE (PF) 2 MG/ML IV SOLN
2.0000 mg | Freq: Once | INTRAVENOUS | Status: AC
  Administered 2018-01-31: 2 mg via INTRAVENOUS
  Filled 2018-01-31: qty 1

## 2018-01-31 MED ORDER — MORPHINE SULFATE (PF) 2 MG/ML IV SOLN
2.0000 mg | Freq: Once | INTRAVENOUS | Status: DC
  Administered 2018-01-31: 2 mg via INTRAVENOUS
  Filled 2018-01-31: qty 1

## 2018-01-31 MED ORDER — IOPAMIDOL (ISOVUE-370) INJECTION 76%
100.0000 mL | Freq: Once | INTRAVENOUS | Status: AC | PRN
  Administered 2018-01-31: 100 mL via INTRAVENOUS

## 2018-01-31 NOTE — ED Notes (Signed)
MD at bedside. 

## 2018-01-31 NOTE — ED Provider Notes (Signed)
[name] Provider Note   CSN: [id number] Arrival date & time: 01/31/18  2103     History   Chief Complaint Chief Complaint  Patient presents with  . Fall    HPI Susan [name] is a 84 y.o. female.  The history is provided by the patient and a relative. No language interpreter was used.  Fall    Susan [name] is a 84 y.o. female who presents to the Emergency Department complaining of fall. He presents for evaluation of injuries following a fall that happened about 6 PM today. She was at church getting ready for a sale when she tripped and fell forward, striking her knees on some object and striking her upper chest and neck on a table. She did hit her head. She did not pass out. She attempted to go home but has experienced significant pain across her chest and difficulty breathing and presents for evaluation. She does have some anterior neck pain as well as upper abdominal pain. She denies any pain to her knees. She does have a history of coronary artery disease and is on Plavix. Past Medical History:  Diagnosis Date  . Coronary artery disease    POST PTCA AND STENTING OF HER RIGHT CORONARY ARTERY  . Hypothyroidism   . LAD stenosis    MODERATE 50-60% STENOSIS  . Osteoporosis     Patient Active Problem List   Diagnosis Date Noted  . Hypokalemia 02/01/2018  . Dyspnea on exertion 10/08/2017  . Cardiomyopathy ([place]) 10/08/2017  . Posterior vitreous detachment of both eyes 04/28/2016  . Hyperlipidemia 07/18/2014  . Nonexudative age-related macular degeneration 07/17/2014  . Status post intraocular lens implant 07/17/2014  . Pseudophakia 10/25/2013  . Anxiety 10/11/2013  . Mechanical complication of intraocular lens 08/12/2012  . Nuclear sclerosis 08/12/2012  . Posterior capsule opacification, right 08/12/2012  . Macular degeneration 08/04/2011  . Pituitary adenoma ([place]) 07/15/2011  . Dehydration 07/14/2011  . Pre-syncope  07/14/2011  . Arteriosclerosis of coronary artery   . Hypothyroidism     Past Surgical History:  Procedure Laterality Date  . fractured wrist repair    . KNEE SURGERY     arthroscopic  torn meniscus  . LUMBAR LAMINECTOMY     years  ago  . ovarian cyst rupture and appy    . RIGHT/LEFT HEART CATH AND CORONARY ANGIOGRAPHY N/A 10/08/2017   Procedure: RIGHT/LEFT HEART CATH AND CORONARY ANGIOGRAPHY;  Surgeon: Nelva Bush, MD;  Location: [facility];  Service: Cardiovascular;  Laterality: N/A;     OB History   None      Home Medications    Prior to Admission medications   Medication Sig Start Date End Date Taking? Authorizing Provider  Calcium Carbonate-Vitamin D (CALCIUM + D PO) Take 1 tablet by mouth at bedtime.     [provider]  carvedilol (COREG) 3.125 MG tablet Take 1 tablet (3.125 mg total) by mouth 2 (two) times daily. 10/08/17 10/08/18  End, Harrell Gave, MD  Cholecalciferol (VITAMIN D3) 2000 units TABS Take 2,000 Units by mouth daily.    [provider]  clopidogrel (PLAVIX) 75 MG tablet TAKE 1 TABLET BY MOUTH ONCE DAILY 10/04/17   Nahser, Wonda Cheng, MD  levothyroxine (SYNTHROID, LEVOTHROID) 75 MCG tablet Take 1 tablet by mouth daily. 11/30/17   [provider]  multivitamin-lutein (OCUVITE-LUTEIN) CAPS capsule Take 1 capsule daily by mouth.    [provider]  nitroGLYCERIN (NITROSTAT) 0.4 MG SL tablet Place 1  tablet (0.4 mg total) under the tongue every 5 (five) minutes as needed. 06/22/11   Nahser, Wonda Cheng, MD  Omega-3 Fatty Acids (FISH OIL) 1200 MG CAPS Take 1,200 mg by mouth at bedtime.    [provider]  rosuvastatin (CRESTOR) 40 MG tablet TAKE 1 TABLET BY MOUTH ONCE DAILY 01/05/18   Nahser, Wonda Cheng, MD  venlafaxine XR (EFFEXOR-XR) 150 MG 24 hr capsule TAKE 1 CAPSULE BY MOUTH ONCE DAILY 06/22/17   Nahser, Wonda Cheng, MD  vitamin B-12 (CYANOCOBALAMIN) 1000 MCG tablet Take 2 tablets by mouth daily.    [provider]     Family History Family History  Problem Relation Age of Onset  . Alzheimer's disease Mother   . Heart attack Father   . Aneurysm Sister     Social History Social History   Tobacco Use  . Smoking status: Never Smoker  . Smokeless tobacco: Never Used  Substance Use Topics  . Alcohol use: Yes    Comment: wine occasional  . Drug use: No     Allergies   Patient has no known allergies.   Review of Systems Review of Systems  All other systems reviewed and are negative.    Physical Exam Updated Vital Signs BP (!) 145/69   Pulse 72   Temp 98.2 F (36.8 C) (Oral)   Resp 18   Ht 5\' 3"  (1.6 m)   Wt 57.6 kg   SpO2 98%   BMI 22.50 kg/m   Physical Exam  Constitutional: She is oriented to person, place, and time. She appears well-developed and well-nourished.  HENT:  Head: Normocephalic.  Mouth/Throat: Oropharynx is clear and moist.  Small amount of ecchymosis under the right lower mandible without local tenderness. Range of motion intact throughout the jaw.  Neck:  Small amount of ecchymosis over the lower right lateral anterior neck. No posterior neck tenderness to palpation.  Cardiovascular: Normal rate and regular rhythm.  No murmur heard. Pulmonary/Chest: Effort normal and breath sounds normal. No respiratory distress.  Ecchymosis over the right upper chest wall with tenderness to palpation. Splinting respirations  Abdominal: Soft. There is no rebound and no guarding.  Mild generalized abdominal tenderness  Musculoskeletal:  Ecchymosis and swelling to bilateral knees with range of motion intact. Mild local tenderness.  Neurological: She is alert and oriented to person, place, and time.  Skin: Skin is warm and dry.  Psychiatric: She has a normal mood and affect. Her behavior is normal.  Nursing note and vitals reviewed.    ED Treatments / Results  Labs (all labs ordered are listed, but only abnormal results are displayed) Labs Reviewed  CBC WITH  DIFFERENTIAL/PLATELET - Abnormal; Notable for the following components:      Result Value   RBC 3.75 (*)    All other components within normal limits  COMPREHENSIVE METABOLIC PANEL - Abnormal; Notable for the following components:   Potassium 3.1 (*)    CO2 20 (*)    Glucose, Bld 101 (*)    Calcium 8.3 (*)    Total Protein 5.9 (*)    Albumin 3.4 (*)    All other components within normal limits  I-STAT CHEM 8, ED - Abnormal; Notable for the following components:   Potassium 3.1 (*)    Calcium, Ion 1.05 (*)    TCO2 20 (*)    All other components within normal limits  I-STAT TROPONIN, ED    EKG EKG Interpretation  Date/Time:  Monday January 31 2018 21:43:25 EDT  Ventricular Rate:  79 PR Interval:    QRS Duration: 134 QT Interval:  434 QTC Calculation: 498 R Axis:   -9 Text Interpretation:  Sinus rhythm Left bundle branch block Baseline wander in lead(s) V1 Partial missing lead(s): V1 Confirmed by Quintella Reichert [phone]) on 01/31/2018 10:06:42 PM   Radiology Ct Head Wo Contrast  Result Date: 01/31/2018 CLINICAL DATA:  Trip and fall on to table. History of pituitary adenoma. EXAM: CT HEAD WITHOUT CONTRAST CT CERVICAL SPINE WITHOUT CONTRAST TECHNIQUE: Multidetector CT imaging of the head and cervical spine was performed following the standard protocol without intravenous contrast. Multiplanar CT image reconstructions of the cervical spine were also generated. COMPARISON:  MRI head January 04, 2018 FINDINGS: CT HEAD FINDINGS BRAIN: No intraparenchymal hemorrhage, mass effect nor midline shift. The ventricles and sulci are normal for age. Old basal ganglia lacunar infarcts. Patchy supratentorial white matter hypodensities less than expected for patient's age, though non-specific are most compatible with chronic small vessel ischemic disease. No acute large vascular territory infarcts. No abnormal extra-axial fluid collections. Basal cisterns are patent. VASCULAR: Mild calcific  atherosclerosis of the carotid siphons. SKULL: No skull fracture. Osteopenia. No significant scalp soft tissue swelling. SINUSES/ORBITS: Severe chronic RIGHT maxillary sinusitis with bony remodeling. Mastoid air cells are well aerated.The included ocular globes and orbital contents are non-suspicious. Status post bilateral ocular lens implants. OTHER: None. CT CERVICAL SPINE FINDINGS ALIGNMENT: Straightened lordosis.  Vertebral bodies in alignment. SKULL BASE AND VERTEBRAE: Cervical vertebral bodies and posterior elements are intact. Severe C3-4 disc height loss with endplate spurring compatible with degenerative disc. No destructive bony lesions. C1-2 articulation maintained. SOFT TISSUES AND SPINAL CANAL: Nonacute. DISC LEVELS: Moderate canal stenosis C3-4. Severe bilateral C3-4 and LEFT C6-7 neural foraminal narrowing. UPPER CHEST: Lung apices are clear. OTHER: None. IMPRESSION: CT HEAD: 1. No acute intracranial process. 2. Mild chronic small vessel ischemic changes and old basal ganglia lacunar infarcts. CT cervical spine: 1. No fracture or malalignment. 2. Moderate canal stenosis C3-4. Severe bilateral C3-4 and LEFT C6-7 neural foraminal narrowing. Electronically Signed   By: Elon Alas M.D.   On: 01/31/2018 23:52   Ct Angio Neck W And/or Wo Contrast  Result Date: 01/31/2018 CLINICAL DATA:  Blunted neck trauma.  Trip and fall onto table. EXAM: CT ANGIOGRAPHY NECK TECHNIQUE: Multidetector CT imaging of the neck was performed using the standard protocol during bolus administration of intravenous contrast. Multiplanar CT image reconstructions and MIPs were obtained to evaluate the vascular anatomy. Carotid stenosis measurements (when applicable) are obtained utilizing NASCET criteria, using the distal internal carotid diameter as the denominator. CONTRAST:  142mL ISOVUE-370 IOPAMIDOL (ISOVUE-370) INJECTION 76% COMPARISON:  None. FINDINGS: AORTIC ARCH: Normal appearance of the thoracic arch, normal  branch pattern. Mild calcific atherosclerosis aortic arch. The origins of the innominate, left Common carotid artery and subclavian artery are widely patent; patulous LEFT subclavian artery origin. RIGHT CAROTID SYSTEM: Common carotid artery is patent, tortuous course. Calcific atherosclerosis resulting in less than 50% stenosis by NASCET criteria. Slight linear filling defect. Patent internal carotid artery, focally beaded and tortuous. LEFT CAROTID SYSTEM: Common carotid artery is patent. Severe calcific atherosclerosis distal common carotid artery and LEFT internal carotid artery origin with less than 50% stenosis by NASCET criteria. Patent internal carotid artery, beaded tortuous cervical internal carotid artery. VERTEBRAL ARTERIES:Codominant vertebral arteries. Moderate stenosis LEFT vertebral artery. Luminal irregularity bilateral vertebral arteries. SKELETON: No acute osseous process though bone windows have not been submitted. OTHER NECK: RIGHT clavicular soft tissue swelling, possible  contusion. UPPER CHEST: Please see dedicated CT of chest from same day, reported separately. IMPRESSION: 1. No acute vascular process. 2. Less than 50% stenosis RIGHT ICA origin. Small non flow-limiting RIGHT ICA web or old dissection without pseudoaneurysm. 3. 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 4. Moderate stenosis LEFT vertebral artery origin. Bilateral vertebral artery atherosclerosis or FMD. Aortic Atherosclerosis (ICD10-I70.0). Electronically Signed   By: Elon Alas M.D.   On: 01/31/2018 23:31   Ct Chest W Contrast  Result Date: 01/31/2018 CLINICAL DATA:  Trauma EXAM: CT CHEST, ABDOMEN, AND PELVIS WITH CONTRAST TECHNIQUE: Multidetector CT imaging of the chest, abdomen and pelvis was performed following the standard protocol during bolus administration of intravenous contrast. CONTRAST:  118mL ISOVUE-370 IOPAMIDOL (ISOVUE-370) INJECTION 76% COMPARISON:  Chest x-ray 01/31/2018  FINDINGS: CT CHEST FINDINGS Cardiovascular: Nonaneurysmal aorta. Mild aortic atherosclerosis. Coronary vascular calcification. Normal heart size. No pericardial effusion. Mediastinum/Nodes: No enlarged mediastinal, hilar, or axillary lymph nodes. Thyroid gland, trachea, and esophagus demonstrate no significant findings. Lungs/Pleura: Mild blebs at the lung bases. No acute consolidation, pleural effusion or pneumothorax. Musculoskeletal: Possible acute to subacute compression fracture T6 with about 25% loss of height of the anterior vertebral body. Sternum is intact. CT ABDOMEN PELVIS FINDINGS Hepatobiliary: Cyst in the left hepatic lobe. No calcified gallstone or biliary dilatation Pancreas: Unremarkable. No pancreatic ductal dilatation or surrounding inflammatory changes. Spleen: No splenic injury or perisplenic hematoma. Adrenals/Urinary Tract: Adrenal glands are within normal limits. No hydronephrosis. 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 Prominent right extrarenal pelvis. Dilated urinary bladder Stomach/Bowel: Stomach is within normal limits. No evidence of bowel wall thickening, distention, or inflammatory changes. Vascular/Lymphatic: Nonaneurysmal aorta. Moderate aortic atherosclerosis. No significantly enlarged lymph nodes. Reproductive: Uterus and bilateral adnexa are unremarkable. Other: Negative for free air or free fluid. Musculoskeletal: No acute or significant osseous findings. Degenerative changes IMPRESSION: 1. No CT evidence for acute intrathoracic, intra-abdominal or intrapelvic abnormality. 2. Possible acute to subacute compression fracture T6 with about 25% loss of height of the anterior vertebral body 3. Ectopic right pelvic kidney Electronically Signed   By: Donavan Foil M.D.   On: 01/31/2018 23:28   Ct Abdomen Pelvis W Contrast  Result Date: 01/31/2018 CLINICAL DATA:  Trauma EXAM: CT CHEST, ABDOMEN, AND PELVIS WITH CONTRAST TECHNIQUE: Multidetector CT imaging of the chest,  abdomen and pelvis was performed following the standard protocol during bolus administration of intravenous contrast. CONTRAST:  125mL ISOVUE-370 IOPAMIDOL (ISOVUE-370) INJECTION 76% COMPARISON:  Chest x-ray 01/31/2018 FINDINGS: CT CHEST FINDINGS Cardiovascular: Nonaneurysmal aorta. Mild aortic atherosclerosis. Coronary vascular calcification. Normal heart size. No pericardial effusion. Mediastinum/Nodes: No enlarged mediastinal, hilar, or axillary lymph nodes. Thyroid gland, trachea, and esophagus demonstrate no significant findings. Lungs/Pleura: Mild blebs at the lung bases. No acute consolidation, pleural effusion or pneumothorax. Musculoskeletal: Possible acute to subacute compression fracture T6 with about 25% loss of height of the anterior vertebral body. Sternum is intact. CT ABDOMEN PELVIS FINDINGS Hepatobiliary: Cyst in the left hepatic lobe. No calcified gallstone or biliary dilatation Pancreas: Unremarkable. No pancreatic ductal dilatation or surrounding inflammatory changes. Spleen: No splenic injury or perisplenic hematoma. Adrenals/Urinary Tract: Adrenal glands are within normal limits. No hydronephrosis. 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 Prominent right extrarenal pelvis. Dilated urinary bladder Stomach/Bowel: Stomach is within normal limits. No evidence of bowel wall thickening, distention, or inflammatory changes. Vascular/Lymphatic: Nonaneurysmal aorta. Moderate aortic atherosclerosis. No significantly enlarged lymph nodes. Reproductive: Uterus and bilateral adnexa are unremarkable. Other: Negative for free air or free fluid. Musculoskeletal:  No acute or significant osseous findings. Degenerative changes IMPRESSION: 1. No CT evidence for acute intrathoracic, intra-abdominal or intrapelvic abnormality. 2. Possible acute to subacute compression fracture T6 with about 25% loss of height of the anterior vertebral body 3. Ectopic right pelvic kidney Electronically Signed   By: Donavan Foil M.D.   On: 01/31/2018 23:28   Ct C-spine No Charge  Result Date: 01/31/2018 CLINICAL DATA:  Trip and fall on to table. History of pituitary adenoma. EXAM: CT HEAD WITHOUT CONTRAST CT CERVICAL SPINE WITHOUT CONTRAST TECHNIQUE: Multidetector CT imaging of the head and cervical spine was performed following the standard protocol without intravenous contrast. Multiplanar CT image reconstructions of the cervical spine were also generated. COMPARISON:  MRI head January 04, 2018 FINDINGS: CT HEAD FINDINGS BRAIN: No intraparenchymal hemorrhage, mass effect nor midline shift. The ventricles and sulci are normal for age. Old basal ganglia lacunar infarcts. Patchy supratentorial white matter hypodensities less than expected for patient's age, though non-specific are most compatible with chronic small vessel ischemic disease. No acute large vascular territory infarcts. No abnormal extra-axial fluid collections. Basal cisterns are patent. VASCULAR: Mild calcific atherosclerosis of the carotid siphons. SKULL: No skull fracture. Osteopenia. No significant scalp soft tissue swelling. SINUSES/ORBITS: Severe chronic RIGHT maxillary sinusitis with bony remodeling. Mastoid air cells are well aerated.The included ocular globes and orbital contents are non-suspicious. Status post bilateral ocular lens implants. OTHER: None. CT CERVICAL SPINE FINDINGS ALIGNMENT: Straightened lordosis.  Vertebral bodies in alignment. SKULL BASE AND VERTEBRAE: Cervical vertebral bodies and posterior elements are intact. Severe C3-4 disc height loss with endplate spurring compatible with degenerative disc. No destructive bony lesions. C1-2 articulation maintained. SOFT TISSUES AND SPINAL CANAL: Nonacute. DISC LEVELS: Moderate canal stenosis C3-4. Severe bilateral C3-4 and LEFT C6-7 neural foraminal narrowing. UPPER CHEST: Lung apices are clear. OTHER: None. IMPRESSION: CT HEAD: 1. No acute intracranial process. 2. Mild chronic small vessel  ischemic changes and old basal ganglia lacunar infarcts. CT cervical spine: 1. No fracture or malalignment. 2. Moderate canal stenosis C3-4. Severe bilateral C3-4 and LEFT C6-7 neural foraminal narrowing. Electronically Signed   By: Elon Alas M.D.   On: 01/31/2018 23:52   Dg Chest Port 1 View  Result Date: 01/31/2018 CLINICAL DATA:  Chest pain after trip and fall striking table. EXAM: PORTABLE CHEST 1 VIEW COMPARISON:  08/16/2010 FINDINGS: Lower lung volumes from prior exam lead to bronchovascular crowding. Mild cardiomegaly is similar allowing for differences in technique, likely accentuated by low lung volumes. Unchanged mediastinal contours. No pneumothorax or focal airspace disease. No large pleural effusion. The bones are under mineralized. No acute osseous abnormalities are seen. IMPRESSION: Low lung volumes without acute abnormality. Electronically Signed   By: Keith Rake M.D.   On: 01/31/2018 22:23   Dg Knee Complete 4 Views Left  Result Date: 01/31/2018 CLINICAL DATA:  Trip and fall into table with bilateral knee pain. EXAM: LEFT KNEE - COMPLETE 4+ VIEW COMPARISON:  None. FINDINGS: No fracture or dislocation. No significant joint effusion. There is prepatellar soft tissue edema. Chondrocalcinosis of the medial and lateral tibiofemoral compartments with mild tricompartmental joint space narrowing and peripheral spurring. The bones are under mineralized. IMPRESSION: 1. Prepatellar soft tissue edema. No fracture or dislocation. 2. Chondrocalcinosis and mild tricompartmental osteoarthritis. Electronically Signed   By: Keith Rake M.D.   On: 01/31/2018 22:26   Dg Knee Complete 4 Views Right  Result Date: 01/31/2018 CLINICAL DATA:  Trip and fall into table with bilateral knee pain. EXAM: RIGHT  KNEE - COMPLETE 4+ VIEW COMPARISON:  None. FINDINGS: No fracture or dislocation. No significant joint effusion. There is prepatellar soft tissue edema. Medial and lateral tibiofemoral  chondrocalcinosis. Medial joint space narrowing with peripheral spurring. Small quadriceps tendon enthesophyte. IMPRESSION: 1. Prepatellar soft tissue edema.  No fracture or dislocation. 2. Mild osteoarthritis and chondrocalcinosis. Electronically Signed   By: Keith Rake M.D.   On: 01/31/2018 22:27    Procedures Procedures (including critical care time)  Medications Ordered in ED Medications  morphine 2 MG/ML injection 2 mg (2 mg Intravenous Given 01/31/18 2152)  iopamidol (ISOVUE-370) 76 % injection 100 mL (100 mLs Intravenous Contrast Given 01/31/18 2247)  morphine 2 MG/ML injection 2 mg (2 mg Intravenous Given 01/31/18 2353)     Initial Impression / Assessment and Plan / ED Course  I have reviewed the triage vital signs and the nursing notes.  Pertinent labs & imaging results that were available during my care of the patient were reviewed by me and considered in my medical decision making (see chart for details).     Patient here for evaluation of injuries following a mechanical fall. She is in significant pain on evaluation in the emergency department and was treated with pain medications. She has pain to her chest and upper back. Extremities with no evidence of acute fracture on imaging. Given location of her pain as well as location of ecchymosis CT head, CTA neck and CT chest abdomen and pelvis with IV contrast were obtained. CT is positive for T6 compression fracture, 25%. She is neurologically intact on examination. She does have persistent pain despite multiple doses of medications. Plan to admit for pain control, TLSO. Medicine consulted for admission.  Final Clinical Impressions(s) / ED Diagnoses   Final diagnoses:  Fall, initial encounter  Thoracic compression fracture [facility])    ED Discharge Orders    None       Quintella Reichert, MD 02/01/18 [phone]

## 2018-01-31 NOTE — ED Triage Notes (Signed)
Patient BIB [facility] for fall. Patient reports walking in her living when she tripped and fell onto a table hitting RIGHT knee and chin. Denies LOC or hitting head. Patient also c/o substernal chest pain that is relieved when she "holds a pillow to her chest". Denies hitting chest. Patient currently taking Plavix.   EMS gave 4 mg Zofran and 100 mcg Fentanyl.   BP 141/91 HR 78 99% RA  CBG 127

## 2018-02-01 ENCOUNTER — Observation Stay (HOSPITAL_COMMUNITY): Payer: Medicare Other

## 2018-02-01 ENCOUNTER — Encounter (HOSPITAL_COMMUNITY): Payer: Self-pay | Admitting: General Practice

## 2018-02-01 ENCOUNTER — Other Ambulatory Visit: Payer: Self-pay

## 2018-02-01 DIAGNOSIS — E876 Hypokalemia: Secondary | ICD-10-CM | POA: Diagnosis present

## 2018-02-01 DIAGNOSIS — E782 Mixed hyperlipidemia: Secondary | ICD-10-CM

## 2018-02-01 DIAGNOSIS — I2510 Atherosclerotic heart disease of native coronary artery without angina pectoris: Secondary | ICD-10-CM | POA: Diagnosis present

## 2018-02-01 DIAGNOSIS — S22050A Wedge compression fracture of T5-T6 vertebra, initial encounter for closed fracture: Secondary | ICD-10-CM | POA: Diagnosis not present

## 2018-02-01 DIAGNOSIS — R079 Chest pain, unspecified: Secondary | ICD-10-CM | POA: Diagnosis not present

## 2018-02-01 DIAGNOSIS — W19XXXA Unspecified fall, initial encounter: Secondary | ICD-10-CM

## 2018-02-01 DIAGNOSIS — E039 Hypothyroidism, unspecified: Secondary | ICD-10-CM

## 2018-02-01 DIAGNOSIS — S22059A Unspecified fracture of T5-T6 vertebra, initial encounter for closed fracture: Secondary | ICD-10-CM | POA: Diagnosis not present

## 2018-02-01 LAB — COMPREHENSIVE METABOLIC PANEL
ALT: 19 U/L (ref 0–44)
ANION GAP: 10 (ref 5–15)
AST: 25 U/L (ref 15–41)
Albumin: 3.9 g/dL (ref 3.5–5.0)
Alkaline Phosphatase: 59 U/L (ref 38–126)
BILIRUBIN TOTAL: 0.6 mg/dL (ref 0.3–1.2)
BUN: 13 mg/dL (ref 8–23)
CHLORIDE: 102 mmol/L (ref 98–111)
CO2: 23 mmol/L (ref 22–32)
Calcium: 9.5 mg/dL (ref 8.9–10.3)
Creatinine, Ser: 0.88 mg/dL (ref 0.44–1.00)
GFR calc Af Amer: >60 mL/min (ref >60)
GFR calc non Af Amer: 60 mL/min — ABNORMAL LOW (ref >60)
Glucose, Bld: 128 mg/dL — ABNORMAL HIGH (ref 70–99)
POTASSIUM: 3.6 mmol/L (ref 3.5–5.1)
Sodium: 135 mmol/L (ref 135–145)
TOTAL PROTEIN: 7.1 g/dL (ref 6.5–8.1)

## 2018-02-01 LAB — CBC
HEMATOCRIT: 40.8 % (ref 36.0–46.0)
HEMOGLOBIN: 13.4 g/dL (ref 12.0–15.0)
MCH: 32.1 pg (ref 26.0–34.0)
MCHC: 32.8 g/dL (ref 30.0–36.0)
MCV: 97.6 fL (ref 78.0–100.0)
Platelets: 203 10*3/uL (ref 150–400)
RBC: 4.18 MIL/uL (ref 3.87–5.11)
RDW: 14 % (ref 11.5–15.5)
WBC: 9.7 10*3/uL (ref 4.0–10.5)

## 2018-02-01 LAB — PHOSPHORUS: PHOSPHORUS: 4 mg/dL (ref 2.5–4.6)

## 2018-02-01 LAB — MAGNESIUM: MAGNESIUM: 2.1 mg/dL (ref 1.7–2.4)

## 2018-02-01 LAB — TSH: TSH: 0.202 u[IU]/mL — AB (ref 0.350–4.500)

## 2018-02-01 MED ORDER — POTASSIUM CHLORIDE CRYS ER 20 MEQ PO TBCR
40.0000 meq | EXTENDED_RELEASE_TABLET | Freq: Once | ORAL | Status: AC
  Administered 2018-02-01: 40 meq via ORAL
  Filled 2018-02-01: qty 2

## 2018-02-01 MED ORDER — SODIUM CHLORIDE 0.9 % IV SOLN
INTRAVENOUS | Status: AC
  Administered 2018-02-01: 03:00:00 via INTRAVENOUS

## 2018-02-01 MED ORDER — POLYETHYLENE GLYCOL 3350 17 G PO PACK
17.0000 g | PACK | Freq: Every day | ORAL | Status: DC | PRN
  Administered 2018-02-05: 17 g via ORAL
  Filled 2018-02-01: qty 1

## 2018-02-01 MED ORDER — TRAMADOL HCL 50 MG PO TABS
100.0000 mg | ORAL_TABLET | Freq: Four times a day (QID) | ORAL | Status: DC | PRN
  Administered 2018-02-01 – 2018-02-02 (×3): 100 mg via ORAL
  Filled 2018-02-01 (×3): qty 2

## 2018-02-01 MED ORDER — VENLAFAXINE HCL ER 150 MG PO CP24
150.0000 mg | ORAL_CAPSULE | Freq: Every day | ORAL | Status: DC
  Administered 2018-02-01 – 2018-02-11 (×11): 150 mg via ORAL
  Filled 2018-02-01 (×11): qty 1

## 2018-02-01 MED ORDER — POTASSIUM CHLORIDE CRYS ER 20 MEQ PO TBCR: 40.0000 meq | EXTENDED_RELEASE_TABLET | Freq: Once | ORAL | Status: DC

## 2018-02-01 MED ORDER — CARVEDILOL 3.125 MG PO TABS
3.1250 mg | ORAL_TABLET | Freq: Two times a day (BID) | ORAL | Status: DC
  Administered 2018-02-01 – 2018-02-11 (×17): 3.125 mg via ORAL
  Filled 2018-02-01 (×20): qty 1

## 2018-02-01 MED ORDER — BISACODYL 10 MG RE SUPP
10.0000 mg | Freq: Every day | RECTAL | Status: DC | PRN
  Administered 2018-02-07: 10 mg via RECTAL
  Filled 2018-02-01: qty 1

## 2018-02-01 MED ORDER — METHOCARBAMOL 750 MG PO TABS
750.0000 mg | ORAL_TABLET | Freq: Three times a day (TID) | ORAL | Status: DC | PRN
  Administered 2018-02-01 – 2018-02-04 (×3): 750 mg via ORAL
  Filled 2018-02-01 (×3): qty 1

## 2018-02-01 MED ORDER — ACETAMINOPHEN 650 MG RE SUPP: 650.0000 mg | Freq: Four times a day (QID) | RECTAL | Status: DC | PRN

## 2018-02-01 MED ORDER — ONDANSETRON HCL 4 MG PO TABS: 4.0000 mg | ORAL_TABLET | Freq: Four times a day (QID) | ORAL | Status: DC | PRN

## 2018-02-01 MED ORDER — MORPHINE SULFATE (PF) 2 MG/ML IV SOLN: 2.0000 mg | INTRAVENOUS | Status: DC | PRN

## 2018-02-01 MED ORDER — LEVOTHYROXINE SODIUM 75 MCG PO TABS
75.0000 ug | ORAL_TABLET | Freq: Every day | ORAL | Status: DC
  Administered 2018-02-01 – 2018-02-11 (×11): 75 ug via ORAL
  Filled 2018-02-01 (×11): qty 1

## 2018-02-01 MED ORDER — ACETAMINOPHEN 325 MG PO TABS
650.0000 mg | ORAL_TABLET | Freq: Four times a day (QID) | ORAL | Status: DC | PRN
  Administered 2018-02-02 – 2018-02-06 (×4): 650 mg via ORAL
  Filled 2018-02-01 (×4): qty 2

## 2018-02-01 MED ORDER — HYDROMORPHONE HCL 1 MG/ML IJ SOLN
0.5000 mg | INTRAMUSCULAR | Status: DC | PRN
  Administered 2018-02-01 – 2018-02-05 (×17): 0.5 mg via INTRAVENOUS
  Filled 2018-02-01 (×17): qty 1

## 2018-02-01 MED ORDER — SENNA 8.6 MG PO TABS
1.0000 | ORAL_TABLET | Freq: Two times a day (BID) | ORAL | Status: DC
  Administered 2018-02-01 – 2018-02-11 (×20): 8.6 mg via ORAL
  Filled 2018-02-01 (×19): qty 1

## 2018-02-01 MED ORDER — ALUM & MAG HYDROXIDE-SIMETH 200-200-20 MG/5ML PO SUSP
30.0000 mL | Freq: Four times a day (QID) | ORAL | Status: DC | PRN
  Administered 2018-02-01 – 2018-02-04 (×4): 30 mL via ORAL
  Filled 2018-02-01 (×3): qty 30

## 2018-02-01 MED ORDER — ONDANSETRON HCL 4 MG/2ML IJ SOLN
4.0000 mg | Freq: Four times a day (QID) | INTRAMUSCULAR | Status: DC | PRN
  Administered 2018-02-03 – 2018-02-04 (×2): 4 mg via INTRAVENOUS
  Filled 2018-02-01 (×2): qty 2

## 2018-02-01 MED ORDER — ROSUVASTATIN CALCIUM 10 MG PO TABS
40.0000 mg | ORAL_TABLET | Freq: Every day | ORAL | Status: DC
  Administered 2018-02-01 – 2018-02-11 (×11): 40 mg via ORAL
  Filled 2018-02-01 (×12): qty 4

## 2018-02-01 NOTE — Progress Notes (Signed)
Patient c/o indigestion and mid chest pain r/t fall. No c/o sob or resp distress. Patient states when she's laying flat and not moving the pain subsides. Patient denies pain to left side of chest or arm. Maalox standing order initiated and administered with effective results. Patient in bed at this time. Will cont to monitor.

## 2018-02-01 NOTE — Progress Notes (Signed)
PROGRESS NOTE  Susan [name]:[id number] DOB: 10-29-1933 DOA: 01/31/2018 PCP: Crist Infante, MD   LOS: 0 days   Brief Narrative / Interim history: Susan [name] is a 84 y/o female with medical history significant for CAD on plavix, chronic systolic LV dysfunction, carotid vascular disease, LBBB, and hypothyroidism who presented to the ED 9/30 with complaints of sternal chest pain after a mechanical fall. On presentation, she was hypertensive with splinting respirations and all other vitals stable. She was found to have mild ecchymosis of right lower mandible and right lower lateral neck, as well as over the right upper chest wall, and bilateral knees. EKG sinus rhythm with LBBB. Imaging of CT head/neck, CT angio neck, CT chest, CT abdomen and pelvis, CT c-spine, CXR, and bilateral knee xrays were done and acute finding for T6 compression fracture with 25% loss anterior vertebral body height. Labs significant for K 3.1 and CO2 20. Troponin negative. Initially she was treated with IV morphine and fentanyl still with significant pain. She was admitted for further management of closed wedge compression fracture of T6 vertebra with uncontrolled pain and hypokalemia.   Subjective: Patient says she is still in a lot of pain that radiates between her back and chest surrounding her sternum. Denis SOB, abdominal pain, bowel/bladder change.   Assessment & Plan: Active Problems:   Arteriosclerosis of coronary artery   Hypothyroidism   Hyperlipidemia   Cardiomyopathy (HCC)   Hypokalemia   Closed wedge compression fracture of T6 vertebra (HCC)   Chest pain   Coronary artery disease   Closed wedge compression fracture of T6 vertebra, chest pain - Chest pain secondary do to fall, likely due to T6 compression fracture - TLSO brace, continue pain management with tylenol, IV dilaudid and morphine, PO tramadol, and robaxin PRN - Radiology reviewed imaging with Dr. Estanislado Pandy, recommend MRI T-spine and  awaiting IR consult   Hypokalemia - Potassium 3.1 on admission, repleted with Klor con 40; K 3.6 today, follow labs and replete as needed - Mag 2.1 stable  Arteriosclerosis of coronary artery, CAD, hyperlipidemia  - On Plavix, last stents 10 years ago- currently holding Plavix pending IR consult for possible procedure - Continue statin  Hypothyroidism - TSH 0.202 - Continue Synthroid 21mcg   Scheduled Meds: . carvedilol  3.125 mg Oral BID WC  . levothyroxine  75 mcg Oral QAC breakfast  . potassium chloride  40 mEq Oral Once  . rosuvastatin  40 mg Oral Daily  . senna  1 tablet Oral BID  . venlafaxine XR  150 mg Oral Daily   Continuous Infusions: . sodium chloride 75 mL/hr at 02/01/18 0301   PRN Meds:.acetaminophen **OR** acetaminophen, alum & mag hydroxide-simeth, bisacodyl, HYDROmorphone (DILAUDID) injection, methocarbamol, morphine injection, ondansetron **OR** ondansetron (ZOFRAN) IV, polyethylene glycol, traMADol  DVT prophylaxis: SCDs Code Status: full code Family Communication: none at bedside Disposition Plan: pending clinical improvement  Consultants:   IR  Procedures:   none  Antimicrobials:  none  Objective: Vitals:   02/01/18 0217 02/01/18 0725 02/01/18 0839 02/01/18 0947  BP: (!) 146/73 140/73    Pulse: 67 72 70 64  Resp: 18 17    Temp: 97.7 F (36.5 C) 98 F (36.7 C)    TempSrc: Oral Oral    SpO2: 100% 99%  100%  Weight:      Height:        Intake/Output Summary (Last 24 hours) at 02/01/2018 1012 Last data filed at 02/01/2018 0630 Gross per 24 hour  Intake -  Output 200 ml  Net -200 ml   Filed Weights   01/31/18 2110  Weight: 57.6 kg    Examination:  Constitutional: sedated, uncomfortable NAD Neck: mild ecchymoses on right lower mandible and right anterior neck Respiratory: good air movement bilaterally, no wheezing Cardiovascular: Regular rate and rhythm, no murmurs; mild ecchymoses on right upper chest wall Abdomen: soft, non  tender Musculoskeletal: mild eccymosis on bilateral knees, no deformities Skin: no rashes, ulcers Neurologic: oriented x3, non focal Psychiatric: mood and affect appropriate   Data Reviewed: I have independently reviewed following labs and imaging studies   CBC: Recent Labs  Lab 01/31/18 2145 01/31/18 2152 02/01/18 0245  WBC 7.6  --  9.7  NEUTROABS 4.8  --   --   HGB 12.2 12.6 13.4  HCT 37.0 37.0 40.8  MCV 98.7  --  97.6  PLT 183  --  144   Basic Metabolic Panel: Recent Labs  Lab 01/31/18 2145 01/31/18 2152 02/01/18 0245  NA 137 140 135  K 3.1* 3.1* 3.6  CL 109 107 102  CO2 20*  --  23  GLUCOSE 101* 93 128*  BUN 15 16 13   CREATININE 0.84 0.70 0.88  CALCIUM 8.3*  --  9.5  MG  --   --  2.1  PHOS  --   --  4.0   GFR: Estimated Creatinine Clearance: 40.8 mL/min (by C-G formula based on SCr of 0.88 mg/dL). Liver Function Tests: Recent Labs  Lab 01/31/18 2145 02/01/18 0245  AST 23 25  ALT 16 19  ALKPHOS 53 59  BILITOT 0.3 0.6  PROT 5.9* 7.1  ALBUMIN 3.4* 3.9   No results for input(s): LIPASE, AMYLASE in the last 168 hours. No results for input(s): AMMONIA in the last 168 hours. Coagulation Profile: No results for input(s): INR, PROTIME in the last 168 hours. Cardiac Enzymes: No results for input(s): CKTOTAL, CKMB, CKMBINDEX, TROPONINI in the last 168 hours. BNP (last 3 results) No results for input(s): PROBNP in the last 8760 hours. HbA1C: No results for input(s): HGBA1C in the last 72 hours. CBG: No results for input(s): GLUCAP in the last 168 hours. Lipid Profile: No results for input(s): CHOL, HDL, LDLCALC, TRIG, CHOLHDL, LDLDIRECT in the last 72 hours. Thyroid Function Tests: Recent Labs    02/01/18 0245  TSH 0.202*   Anemia Panel: No results for input(s): VITAMINB12, FOLATE, FERRITIN, TIBC, IRON, RETICCTPCT in the last 72 hours. Urine analysis:    Component Value Date/Time   COLORURINE YELLOW 07/13/2011 2036   APPEARANCEUR CLOUDY (A)  07/13/2011 2036   LABSPEC 1.006 07/13/2011 2036   PHURINE 7.5 07/13/2011 2036   GLUCOSEU NEGATIVE 07/13/2011 2036   HGBUR NEGATIVE 07/13/2011 2036   BILIRUBINUR NEGATIVE 07/13/2011 2036   KETONESUR NEGATIVE 07/13/2011 2036   PROTEINUR NEGATIVE 07/13/2011 2036   UROBILINOGEN 0.2 07/13/2011 2036   NITRITE NEGATIVE 07/13/2011 2036   LEUKOCYTESUR SMALL (A) 07/13/2011 2036   Sepsis Labs: Invalid input(s): PROCALCITONIN, LACTICIDVEN  No results found for this or any previous visit (from the past 240 hour(s)).    Radiology Studies: Ct Head Wo Contrast  Result Date: 01/31/2018 CLINICAL DATA:  Trip and fall on to table. History of pituitary adenoma. EXAM: CT HEAD WITHOUT CONTRAST CT CERVICAL SPINE WITHOUT CONTRAST TECHNIQUE: Multidetector CT imaging of the head and cervical spine was performed following the standard protocol without intravenous contrast. Multiplanar CT image reconstructions of the cervical spine were also generated. COMPARISON:  MRI head January 04, 2018  FINDINGS: CT HEAD FINDINGS BRAIN: No intraparenchymal hemorrhage, mass effect nor midline shift. The ventricles and sulci are normal for age. Old basal ganglia lacunar infarcts. Patchy supratentorial white matter hypodensities less than expected for patient's age, though non-specific are most compatible with chronic small vessel ischemic disease. No acute large vascular territory infarcts. No abnormal extra-axial fluid collections. Basal cisterns are patent. VASCULAR: Mild calcific atherosclerosis of the carotid siphons. SKULL: No skull fracture. Osteopenia. No significant scalp soft tissue swelling. SINUSES/ORBITS: Severe chronic RIGHT maxillary sinusitis with bony remodeling. Mastoid air cells are well aerated.The included ocular globes and orbital contents are non-suspicious. Status post bilateral ocular lens implants. OTHER: None. CT CERVICAL SPINE FINDINGS ALIGNMENT: Straightened lordosis.  Vertebral bodies in alignment. SKULL BASE  AND VERTEBRAE: Cervical vertebral bodies and posterior elements are intact. Severe C3-4 disc height loss with endplate spurring compatible with degenerative disc. No destructive bony lesions. C1-2 articulation maintained. SOFT TISSUES AND SPINAL CANAL: Nonacute. DISC LEVELS: Moderate canal stenosis C3-4. Severe bilateral C3-4 and LEFT C6-7 neural foraminal narrowing. UPPER CHEST: Lung apices are clear. OTHER: None. IMPRESSION: CT HEAD: 1. No acute intracranial process. 2. Mild chronic small vessel ischemic changes and old basal ganglia lacunar infarcts. CT cervical spine: 1. No fracture or malalignment. 2. Moderate canal stenosis C3-4. Severe bilateral C3-4 and LEFT C6-7 neural foraminal narrowing. Electronically Signed   By: Elon Alas M.D.   On: 01/31/2018 23:52   Ct Angio Neck W And/or Wo Contrast  Result Date: 01/31/2018 CLINICAL DATA:  Blunted neck trauma.  Trip and fall onto table. EXAM: CT ANGIOGRAPHY NECK TECHNIQUE: Multidetector CT imaging of the neck was performed using the standard protocol during bolus administration of intravenous contrast. Multiplanar CT image reconstructions and MIPs were obtained to evaluate the vascular anatomy. Carotid stenosis measurements (when applicable) are obtained utilizing NASCET criteria, using the distal internal carotid diameter as the denominator. CONTRAST:  18mL ISOVUE-370 IOPAMIDOL (ISOVUE-370) INJECTION 76% COMPARISON:  None. FINDINGS: AORTIC ARCH: Normal appearance of the thoracic arch, normal branch pattern. Mild calcific atherosclerosis aortic arch. The origins of the innominate, left Common carotid artery and subclavian artery are widely patent; patulous LEFT subclavian artery origin. RIGHT CAROTID SYSTEM: Common carotid artery is patent, tortuous course. Calcific atherosclerosis resulting in less than 50% stenosis by NASCET criteria. Slight linear filling defect. Patent internal carotid artery, focally beaded and tortuous. LEFT CAROTID SYSTEM: Common  carotid artery is patent. Severe calcific atherosclerosis distal common carotid artery and LEFT internal carotid artery origin with less than 50% stenosis by NASCET criteria. Patent internal carotid artery, beaded tortuous cervical internal carotid artery. VERTEBRAL ARTERIES:Codominant vertebral arteries. Moderate stenosis LEFT vertebral artery. Luminal irregularity bilateral vertebral arteries. SKELETON: No acute osseous process though bone windows have not been submitted. OTHER NECK: RIGHT clavicular soft tissue swelling, possible contusion. UPPER CHEST: Please see dedicated CT of chest from same day, reported separately. IMPRESSION: 1. No acute vascular process. 2. Less than 50% stenosis RIGHT ICA origin. Small non flow-limiting RIGHT ICA web or old dissection without pseudoaneurysm. 3. 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 4. Moderate stenosis LEFT vertebral artery origin. Bilateral vertebral artery atherosclerosis or FMD. Aortic Atherosclerosis (ICD10-I70.0). Electronically Signed   By: Elon Alas M.D.   On: 01/31/2018 23:31   Ct Chest W Contrast  Result Date: 01/31/2018 CLINICAL DATA:  Trauma EXAM: CT CHEST, ABDOMEN, AND PELVIS WITH CONTRAST TECHNIQUE: Multidetector CT imaging of the chest, abdomen and pelvis was performed following the standard protocol during bolus administration of intravenous contrast. CONTRAST:  134mL ISOVUE-370 IOPAMIDOL (ISOVUE-370) INJECTION 76% COMPARISON:  Chest x-ray 01/31/2018 FINDINGS: CT CHEST FINDINGS Cardiovascular: Nonaneurysmal aorta. Mild aortic atherosclerosis. Coronary vascular calcification. Normal heart size. No pericardial effusion. Mediastinum/Nodes: No enlarged mediastinal, hilar, or axillary lymph nodes. Thyroid gland, trachea, and esophagus demonstrate no significant findings. Lungs/Pleura: Mild blebs at the lung bases. No acute consolidation, pleural effusion or pneumothorax. Musculoskeletal: Possible acute to subacute  compression fracture T6 with about 25% loss of height of the anterior vertebral body. Sternum is intact. CT ABDOMEN PELVIS FINDINGS Hepatobiliary: Cyst in the left hepatic lobe. No calcified gallstone or biliary dilatation Pancreas: Unremarkable. No pancreatic ductal dilatation or surrounding inflammatory changes. Spleen: No splenic injury or perisplenic hematoma. Adrenals/Urinary Tract: Adrenal glands are within normal limits. No hydronephrosis. 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 Prominent right extrarenal pelvis. Dilated urinary bladder Stomach/Bowel: Stomach is within normal limits. No evidence of bowel wall thickening, distention, or inflammatory changes. Vascular/Lymphatic: Nonaneurysmal aorta. Moderate aortic atherosclerosis. No significantly enlarged lymph nodes. Reproductive: Uterus and bilateral adnexa are unremarkable. Other: Negative for free air or free fluid. Musculoskeletal: No acute or significant osseous findings. Degenerative changes IMPRESSION: 1. No CT evidence for acute intrathoracic, intra-abdominal or intrapelvic abnormality. 2. Possible acute to subacute compression fracture T6 with about 25% loss of height of the anterior vertebral body 3. Ectopic right pelvic kidney Electronically Signed   By: Donavan Foil M.D.   On: 01/31/2018 23:28   Ct Abdomen Pelvis W Contrast  Result Date: 01/31/2018 CLINICAL DATA:  Trauma EXAM: CT CHEST, ABDOMEN, AND PELVIS WITH CONTRAST TECHNIQUE: Multidetector CT imaging of the chest, abdomen and pelvis was performed following the standard protocol during bolus administration of intravenous contrast. CONTRAST:  148mL ISOVUE-370 IOPAMIDOL (ISOVUE-370) INJECTION 76% COMPARISON:  Chest x-ray 01/31/2018 FINDINGS: CT CHEST FINDINGS Cardiovascular: Nonaneurysmal aorta. Mild aortic atherosclerosis. Coronary vascular calcification. Normal heart size. No pericardial effusion. Mediastinum/Nodes: No enlarged mediastinal, hilar, or axillary lymph nodes.  Thyroid gland, trachea, and esophagus demonstrate no significant findings. Lungs/Pleura: Mild blebs at the lung bases. No acute consolidation, pleural effusion or pneumothorax. Musculoskeletal: Possible acute to subacute compression fracture T6 with about 25% loss of height of the anterior vertebral body. Sternum is intact. CT ABDOMEN PELVIS FINDINGS Hepatobiliary: Cyst in the left hepatic lobe. No calcified gallstone or biliary dilatation Pancreas: Unremarkable. No pancreatic ductal dilatation or surrounding inflammatory changes. Spleen: No splenic injury or perisplenic hematoma. Adrenals/Urinary Tract: Adrenal glands are within normal limits. No hydronephrosis. 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 Prominent right extrarenal pelvis. Dilated urinary bladder Stomach/Bowel: Stomach is within normal limits. No evidence of bowel wall thickening, distention, or inflammatory changes. Vascular/Lymphatic: Nonaneurysmal aorta. Moderate aortic atherosclerosis. No significantly enlarged lymph nodes. Reproductive: Uterus and bilateral adnexa are unremarkable. Other: Negative for free air or free fluid. Musculoskeletal: No acute or significant osseous findings. Degenerative changes IMPRESSION: 1. No CT evidence for acute intrathoracic, intra-abdominal or intrapelvic abnormality. 2. Possible acute to subacute compression fracture T6 with about 25% loss of height of the anterior vertebral body 3. Ectopic right pelvic kidney Electronically Signed   By: Donavan Foil M.D.   On: 01/31/2018 23:28   Ct C-spine No Charge  Result Date: 01/31/2018 CLINICAL DATA:  Trip and fall on to table. History of pituitary adenoma. EXAM: CT HEAD WITHOUT CONTRAST CT CERVICAL SPINE WITHOUT CONTRAST TECHNIQUE: Multidetector CT imaging of the head and cervical spine was performed following the standard protocol without intravenous contrast. Multiplanar CT image reconstructions of the cervical spine were also generated. COMPARISON:  MRI  head January 04, 2018 FINDINGS: CT HEAD FINDINGS BRAIN: No intraparenchymal hemorrhage, mass effect nor midline shift. The ventricles and sulci are normal for age. Old basal ganglia lacunar infarcts. Patchy supratentorial white matter hypodensities less than expected for patient's age, though non-specific are most compatible with chronic small vessel ischemic disease. No acute large vascular territory infarcts. No abnormal extra-axial fluid collections. Basal cisterns are patent. VASCULAR: Mild calcific atherosclerosis of the carotid siphons. SKULL: No skull fracture. Osteopenia. No significant scalp soft tissue swelling. SINUSES/ORBITS: Severe chronic RIGHT maxillary sinusitis with bony remodeling. Mastoid air cells are well aerated.The included ocular globes and orbital contents are non-suspicious. Status post bilateral ocular lens implants. OTHER: None. CT CERVICAL SPINE FINDINGS ALIGNMENT: Straightened lordosis.  Vertebral bodies in alignment. SKULL BASE AND VERTEBRAE: Cervical vertebral bodies and posterior elements are intact. Severe C3-4 disc height loss with endplate spurring compatible with degenerative disc. No destructive bony lesions. C1-2 articulation maintained. SOFT TISSUES AND SPINAL CANAL: Nonacute. DISC LEVELS: Moderate canal stenosis C3-4. Severe bilateral C3-4 and LEFT C6-7 neural foraminal narrowing. UPPER CHEST: Lung apices are clear. OTHER: None. IMPRESSION: CT HEAD: 1. No acute intracranial process. 2. Mild chronic small vessel ischemic changes and old basal ganglia lacunar infarcts. CT cervical spine: 1. No fracture or malalignment. 2. Moderate canal stenosis C3-4. Severe bilateral C3-4 and LEFT C6-7 neural foraminal narrowing. Electronically Signed   By: Elon Alas M.D.   On: 01/31/2018 23:52   Dg Chest Port 1 View  Result Date: 01/31/2018 CLINICAL DATA:  Chest pain after trip and fall striking table. EXAM: PORTABLE CHEST 1 VIEW COMPARISON:  08/16/2010 FINDINGS: Lower lung  volumes from prior exam lead to bronchovascular crowding. Mild cardiomegaly is similar allowing for differences in technique, likely accentuated by low lung volumes. Unchanged mediastinal contours. No pneumothorax or focal airspace disease. No large pleural effusion. The bones are under mineralized. No acute osseous abnormalities are seen. IMPRESSION: Low lung volumes without acute abnormality. Electronically Signed   By: Keith Rake M.D.   On: 01/31/2018 22:23   Dg Knee Complete 4 Views Left  Result Date: 01/31/2018 CLINICAL DATA:  Trip and fall into table with bilateral knee pain. EXAM: LEFT KNEE - COMPLETE 4+ VIEW COMPARISON:  None. FINDINGS: No fracture or dislocation. No significant joint effusion. There is prepatellar soft tissue edema. Chondrocalcinosis of the medial and lateral tibiofemoral compartments with mild tricompartmental joint space narrowing and peripheral spurring. The bones are under mineralized. IMPRESSION: 1. Prepatellar soft tissue edema. No fracture or dislocation. 2. Chondrocalcinosis and mild tricompartmental osteoarthritis. Electronically Signed   By: Keith Rake M.D.   On: 01/31/2018 22:26   Dg Knee Complete 4 Views Right  Result Date: 01/31/2018 CLINICAL DATA:  Trip and fall into table with bilateral knee pain. EXAM: RIGHT KNEE - COMPLETE 4+ VIEW COMPARISON:  None. FINDINGS: No fracture or dislocation. No significant joint effusion. There is prepatellar soft tissue edema. Medial and lateral tibiofemoral chondrocalcinosis. Medial joint space narrowing with peripheral spurring. Small quadriceps tendon enthesophyte. IMPRESSION: 1. Prepatellar soft tissue edema.  No fracture or dislocation. 2. Mild osteoarthritis and chondrocalcinosis. Electronically Signed   By: Keith Rake M.D.   On: 01/31/2018 22:27       Time spent: [place], PA-S [facility]  If 7PM-7AM, please contact night-coverage [email or web address] Password [email or web address] 02/01/2018,  10:12 AM   [email or web address] @

## 2018-02-01 NOTE — ED Notes (Signed)
C-Collar removed per MD.

## 2018-02-01 NOTE — Plan of Care (Signed)

## 2018-02-01 NOTE — Evaluation (Addendum)
Occupational Therapy Evaluation Patient Details Name: Susan [name] MRN: [id number] DOB: Jul 25, 1933 Today's Date: 02/01/2018    History of Present Illness Pt is an 84 y/o female who presents s/p fall at home. Pt hit her R knee and top of her chin on the cofee table and sustained a T6 fracture. C-spine cleared. PMH significant for osteoporosis, LAD stenosis, hypothyroidism, CAD, right/left heart cath and coronary angiography 10/08/2017, lumbar laminectomy, fractured wrist repair.    Clinical Impression   PTA, pt was living alone and was independent; pt lethargic during session having just received pain medication and so unsure of home set up and family support. Pt reports she enjoys working in her yard and playing tennis. Pt currently requiring Min-Max A for UB ADLs, Mod-Max A for LB ADLs, and Min A +2 with RW for functional transfer/mobility. Pt highly motivated to participate in therapy despite pain and fatigue. Pt would benefit from further acute OT to facilitate safe dc. Due to pt's motivation and PLOF, recommend dc to CIR for further OT to optimize safety, independence with ADLs, and return to PLOF.      Follow Up Recommendations  CIR;Supervision/Assistance - 24 hour    Equipment Recommendations  Other (comment)(Will need further information about home set up)    Recommendations for Other Services Rehab consult;PT consult     Precautions / Restrictions Precautions Precautions: Fall;Back Precaution Booklet Issued: No Precaution Comments: Discussed log roll technique during functional mobility and educated Stage manager while in room. Will need further education regarding back precautions when pt is more awake.  Required Braces or Orthoses: Spinal Brace Spinal Brace: Thoracolumbosacral orthotic;Applied in sitting position(Ok to remove for showering) Restrictions Weight Bearing Restrictions: No      Mobility Bed Mobility Overal bed mobility: Needs Assistance Bed Mobility:  Rolling;Sidelying to Sit;Sit to Sidelying Rolling: Min assist Sidelying to sit: Mod assist;+2 for physical assistance     Sit to sidelying: Mod assist;+2 for safety/equipment General bed mobility comments: VC's for log roll technique. Physical assist required for proper sequencing.   Transfers Overall transfer level: Needs assistance Equipment used: Rolling walker (2 wheeled) Transfers: Sit to/from Stand Sit to Stand: Min assist;+2 physical assistance;+2 safety/equipment         General transfer comment: Pt initially stood without RW. Held to both therapists for support (likely due to pain) and then RW was brought up to her. Noted posterior lean and difficulty achieving full hip extension - again likely due to pain.     Balance Overall balance assessment: Needs assistance Sitting-balance support: Feet supported;No upper extremity supported Sitting balance-Leahy Scale: Fair Sitting balance - Comments: posterior lean but was able to sit unsupported EOB Postural control: Posterior lean Standing balance support: Bilateral upper extremity supported Standing balance-Leahy Scale: Poor Standing balance comment: Reliance on BUE support at this time.                            ADL either performed or assessed with clinical judgement   ADL Overall ADL's : Needs assistance/impaired Eating/Feeding: Set up;Supervision/ safety;Sitting   Grooming: Set up;Supervision/safety;Sitting   Upper Body Bathing: Minimal assistance;Sitting   Lower Body Bathing: Moderate assistance;+2 for physical assistance;Sit to/from stand   Upper Body Dressing : Maximal assistance;Sitting Upper Body Dressing Details (indicate cue type and reason): Max A to don brace while seated at EOB Lower Body Dressing: Maximal assistance;+2 for physical assistance;Sit to/from stand Lower Body Dressing Details (indicate cue type and reason): Max  A for LB dressing. Min A +2 for functional transfers     [place]ney and Hygiene: Minimal assistance;+2 for physical assistance;Sit to/from stand Toileting - Clothing Manipulation Details (indicate cue type and reason): Min A +2 for sit<>Stand and maintain balance during peri care. Pt performing peri care with cues to bend at knees and prevent bending at back     Functional mobility during ADLs: Minimal assistance;+2 for physical assistance;Rolling walker(side steps towards EOB) General ADL Comments: Pt with decreased functional performance and limited by lethargy and pain. Despite pain, pt motivated to participate in therapy.     Vision         Perception     Praxis      Pertinent Vitals/Pain Pain Assessment: 0-10 Pain Score: 9  Pain Location: Back Pain Descriptors / Indicators: Discomfort;Grimacing Pain Intervention(s): Monitored during session;Limited activity within patient's tolerance;Repositioned     Hand Dominance Right   Extremity/Trunk Assessment Upper Extremity Assessment Upper Extremity Assessment: Overall WFL for tasks assessed   Lower Extremity Assessment Lower Extremity Assessment: Defer to PT evaluation RLE Deficits / Details: Noted large bruise on R knee and edema in the surrounding area.    Cervical / Trunk Assessment Cervical / Trunk Assessment: Other exceptions Cervical / Trunk Exceptions: T6 fracture   Communication Communication Communication: No difficulties   Cognition Arousal/Alertness: Lethargic;Suspect due to medications Behavior During Therapy: Flat affect Overall Cognitive Status: Within Functional Limits for tasks assessed                                 General Comments: Following simple commands. Requiring significant amount of time. Suspected due to medication prior to session   General Comments  VSS on roomair. Placing pt back on 2L at end of session since she preered to lay in supine with  [facility] lowered    Exercises     Shoulder Instructions      Home Living  Family/patient expects to be discharged to:: Private residence Living Arrangements: Alone                               Additional Comments: Pt had pain medication prior to PT session and was very drowsy. Unsure about home set up or available assistance from family/friends/hired caregivers available at d/c.       Prior Functioning/Environment Level of Independence: Independent        Comments: PTA pt very independent. Apparently pt plays tennis and works in her yard.         OT Problem List: Decreased strength;Decreased range of motion;Decreased activity tolerance;Impaired balance (sitting and/or standing);Decreased knowledge of use of DME or AE;Decreased knowledge of precautions;Pain      OT Treatment/Interventions: Self-care/ADL training;Therapeutic exercise;Energy conservation;DME and/or AE instruction;Therapeutic activities;Patient/family education    OT Goals(Current goals can be found in the care plan section) Acute Rehab OT Goals Patient Stated Goal: Return home and working in the yard OT Goal Formulation: With patient Time For Goal Achievement: 02/15/18 Potential to Achieve Goals: Good ADL Goals Pt Will Perform Upper Body Dressing: with modified independence;sitting Pt Will Perform Lower Body Dressing: with modified independence;sit to/from stand;with adaptive equipment Pt Will Transfer to Toilet: with modified independence;ambulating;bedside commode Pt Will Perform Toileting - Clothing Manipulation and hygiene: with modified independence;sit to/from stand;with adaptive equipment Additional ADL Goal #1: Pt will perform bed mobility with supervision using log  roll technique in preparation for ADLs. Additional ADL Goal #2: Pt will verbalize 3/3 back precautions independently  OT Frequency: Min 2X/week   Barriers to D/C:            Co-evaluation PT/OT/SLP Co-Evaluation/Treatment: Yes Reason for Co-Treatment: Complexity of the patient's impairments  (multi-system involvement);For patient/therapist safety;To address functional/ADL transfers PT goals addressed during session: Mobility/safety with mobility;Balance;Proper use of DME OT goals addressed during session: ADL's and self-care      AM-PAC PT "6 Clicks" Daily Activity     Outcome Measure Help from another person eating meals?: None Help from another person taking care of personal grooming?: A Little Help from another person toileting, which includes using toliet, bedpan, or urinal?: A Little Help from another person bathing (including washing, rinsing, drying)?: A Lot Help from another person to put on and taking off regular upper body clothing?: A Lot Help from another person to put on and taking off regular lower body clothing?: A Lot 6 Click Score: 16   End of Session Equipment Utilized During Treatment: Gait belt;Rolling walker;Back brace;Oxygen Nurse Communication: Mobility status;Precautions  Activity Tolerance: Patient tolerated treatment well;Patient limited by lethargy Patient left: in bed;with call bell/phone within reach;with bed alarm set  OT Visit Diagnosis: Unsteadiness on feet (R26.81);Other abnormalities of gait and mobility (R26.89);Muscle weakness (generalized) (M62.81);Other symptoms and signs involving cognitive function;Pain Pain - part of body: (Back)                Time: 4010-2725 OT Time Calculation (min): 25 min Charges:  OT General Charges $OT Visit: 1 Visit OT Evaluation $OT Eval Moderate Complexity: Burney, OTR/L Acute Rehab Pager: [phone] Office: [place] 02/01/2018, 3:29 PM

## 2018-02-01 NOTE — Progress Notes (Signed)
Patient ID: Susan [name], female   DOB: Apr 16, 1934, 84 y.o.   MRN: [id number]   T6 fracture seen on CT Chest scan Post fall at home +Back pain  Request for T6 VP  Reviewed imaging with Dr Estanislado Pandy Order for MRI T spine per Dr Estanislado Pandy

## 2018-02-01 NOTE — Progress Notes (Signed)
PROGRESS NOTE    Susan [name]  FAO:[id number] DOB: April 12, 1934 DOA: 01/31/2018 PCP: Crist Infante, MD    Brief Narrative:  84 year old female who presented after a mechanical fall.  She does have the significant past medical history for coronary artery disease, chronic diastolic heart failure, carotid vascular disease, dyslipidemia and hypothyroidism.  Patient sustained a mechanical fall from her own height, trauma to her chest and back, no head trauma loss of consciousness.  On her initial physical examination blood pressure 145/69, heart rate 72, temperature 98.2, respiratory rate 18, oxygen saturation 98%.  Her lungs were clear to auscultation bilaterally, heart S1-S2 present rhythmic, abdomen soft nontender, no lower extremity edema.  CT of the abdomen showed T6 subacute compression fracture with about 25% loss of height of the anterior vertebral body.   Patient was admitted to the hospital with working diagnosis of T6 subacute compression fracture.  Assessment & Plan:   Active Problems:   Arteriosclerosis of coronary artery   Hypothyroidism   Hyperlipidemia   Cardiomyopathy (HCC)   Hypokalemia   Closed wedge compression fracture of T6 vertebra (HCC)   Chest pain   Coronary artery disease   1.  T6 acute/ subacute compression fracture. Patient with significant pain, worse with movement, will continue pain control with hydromorphone IV, methocarbamol, and tramadol. Follow on MRI result and IR recommendations, possible hypoplasty. Physical therapy evaluation, recommendation for CIR.   2. Dyslipidemia. Continue rosuvastatin.   3. Hypothyroid. On levothyroxine.   4. Hypertension. Will continue blood pressure control with carvedilol.  5. Hypokalemia. K at 3,6, will continue k correction with Kcl, will follow on renal panel in am. Preserved renal function with serum cr at 0.88.      DVT prophylaxis: scd   Code Status: full Family Communication: I spoke with patient's family at  the bedside and all questions were addressed.  Disposition Plan/ discharge barriers: pending MRI   Consultants:   IR  Procedures:     Antimicrobials:       Subjective: Patient is feeling persistent back and chest pain, moderate to severe in intensity, worse with movement, improved with IV analgesics, no radiation.   Objective: Vitals:   02/01/18 1141 02/01/18 1510 02/01/18 1627 02/01/18 1648  BP: 126/71 (!) 124/55    Pulse: 65 62 64   Resp: 13 14 (!) 24 16  Temp: 97.7 F (36.5 C) 98.1 F (36.7 C)    TempSrc: Oral Oral    SpO2: 100% 97% 98%   Weight:      Height:        Intake/Output Summary (Last 24 hours) at 02/01/2018 1701 Last data filed at 02/01/2018 1500 Gross per 24 hour  Intake 960 ml  Output 700 ml  Net 260 ml   Filed Weights   01/31/18 2110  Weight: 57.6 kg    Examination:   General: deconditioned  Neurology: Awake and alert, non focal  E ENT: mild pallor, no icterus, oral mucosa moist Cardiovascular: No JVD. S1-S2 present, rhythmic, no gallops, rubs, or murmurs. No lower extremity edema. Pulmonary: vesicular breath sounds bilaterally, adequate air movement, no wheezing, rhonchi or rales. Gastrointestinal. Abdomen with no organomegaly, non tender, no rebound or guarding Skin. No rashes Musculoskeletal: no joint deformities     Data Reviewed: I have personally reviewed following labs and imaging studies  CBC: Recent Labs  Lab 01/31/18 2145 01/31/18 2152 02/01/18 0245  WBC 7.6  --  9.7  NEUTROABS 4.8  --   --   HGB 12.2  12.6 13.4  HCT 37.0 37.0 40.8  MCV 98.7  --  97.6  PLT 183  --  921   Basic Metabolic Panel: Recent Labs  Lab 01/31/18 2145 01/31/18 2152 02/01/18 0245  NA 137 140 135  K 3.1* 3.1* 3.6  CL 109 107 102  CO2 20*  --  23  GLUCOSE 101* 93 128*  BUN 15 16 13   CREATININE 0.84 0.70 0.88  CALCIUM 8.3*  --  9.5  MG  --   --  2.1  PHOS  --   --  4.0   GFR: Estimated Creatinine Clearance: 40.8 mL/min (by C-G  formula based on SCr of 0.88 mg/dL). Liver Function Tests: Recent Labs  Lab 01/31/18 2145 02/01/18 0245  AST 23 25  ALT 16 19  ALKPHOS 53 59  BILITOT 0.3 0.6  PROT 5.9* 7.1  ALBUMIN 3.4* 3.9   No results for input(s): LIPASE, AMYLASE in the last 168 hours. No results for input(s): AMMONIA in the last 168 hours. Coagulation Profile: No results for input(s): INR, PROTIME in the last 168 hours. Cardiac Enzymes: No results for input(s): CKTOTAL, CKMB, CKMBINDEX, TROPONINI in the last 168 hours. BNP (last 3 results) No results for input(s): PROBNP in the last 8760 hours. HbA1C: No results for input(s): HGBA1C in the last 72 hours. CBG: No results for input(s): GLUCAP in the last 168 hours. Lipid Profile: No results for input(s): CHOL, HDL, LDLCALC, TRIG, CHOLHDL, LDLDIRECT in the last 72 hours. Thyroid Function Tests: Recent Labs    02/01/18 0245  TSH 0.202*   Anemia Panel: No results for input(s): VITAMINB12, FOLATE, FERRITIN, TIBC, IRON, RETICCTPCT in the last 72 hours.    Radiology Studies: I have reviewed all of the imaging during this hospital visit personally     Scheduled Meds: . carvedilol  3.125 mg Oral BID WC  . levothyroxine  75 mcg Oral QAC breakfast  . potassium chloride  40 mEq Oral Once  . rosuvastatin  40 mg Oral Daily  . senna  1 tablet Oral BID  . venlafaxine XR  150 mg Oral Daily   Continuous Infusions:   LOS: 0 days        Mauricio Gerome Apley, MD [facility] Pager [phone]

## 2018-02-01 NOTE — Plan of Care (Signed)
  Problem: Pain Managment: Goal: General experience of comfort will improve Outcome: Progressing   

## 2018-02-01 NOTE — H&P (Signed)
ADY HEIMANN SEG:[id number] DOB: [birth year] DOA: 01/31/2018     PCP: Crist Infante, MD   Outpatient Specialists:  CARDS:   Dr. Loma Boston   Patient arrived to ER on 01/31/18 at 2103  Patient coming from: home Lives alone,      Chief Complaint:  Chief Complaint  Patient presents with  . Fall    HPI: Susan [name] is a 84 y.o. female with medical history significant of CAD, chronic systolic LV dysfunction, Carotid vascular disease, and dyslipidemia.LBBB hypothyroidism    Presented with mechanical fall.  Patient was walking her living room tripped and fell onto the table hitting right knee and chin upper neck   this occurred at 6 PM with substantial sternal chest pain which is better when she holds a pillow to her chest EMS was called who gave her 100 mcg of fentanyl on arrival blood pressure 141/91 99% room air CBG 127 heart rate 78  Regarding pertinent Chronic problems: CAD on Plavix  Hypothyroidism on Synthroid Hyperlipidemia on Crestor While in ER: Pain treated with IV morphine  Have been having significant pain despite IV Fentanyl and 2 doses of Morphine  The following Work up has been ordered so far:  Orders Placed This Encounter  Procedures  . CT Head Wo Contrast  . CT Abdomen Pelvis W Contrast  . CT Chest W Contrast  . DG Chest Port 1 View  . CT Angio Neck W and/or Wo Contrast  . DG Knee Complete 4 Views Left  . DG Knee Complete 4 Views Right  . CT C-SPINE NO CHARGE  . CBC with Differential  . Comprehensive metabolic panel  . Cardiac monitoring  . Consult to hospitalist  . I-stat Chem 8, ED  . I-stat troponin, ED  . ED EKG  . EKG 12-Lead  . Saline lock IV      Following Medications were ordered in ER: Medications  morphine 2 MG/ML injection 2 mg (2 mg Intravenous Given 01/31/18 2152)  iopamidol (ISOVUE-370) 76 % injection 100 mL (100 mLs Intravenous Contrast Given 01/31/18 2247)  morphine 2 MG/ML injection 2 mg (2 mg Intravenous Given  01/31/18 2353)    Significant initial  Findings: Abnormal Labs Reviewed  CBC WITH DIFFERENTIAL/PLATELET - Abnormal; Notable for the following components:      Result Value   RBC 3.75 (*)    All other components within normal limits  COMPREHENSIVE METABOLIC PANEL - Abnormal; Notable for the following components:   Potassium 3.1 (*)    CO2 20 (*)    Glucose, Bld 101 (*)    Calcium 8.3 (*)    Total Protein 5.9 (*)    Albumin 3.4 (*)    All other components within normal limits  I-STAT CHEM 8, ED - Abnormal; Notable for the following components:   Potassium 3.1 (*)    Calcium, Ion 1.05 (*)    TCO2 20 (*)    All other components within normal limits   Na 140 K 3.1  Cr   stable,   Lab Results  Component Value Date   CREATININE 0.70 01/31/2018   CREATININE 0.84 01/31/2018   CREATININE 0.86 10/06/2017      WBC 7.6  HG/HCT  Stable,     Component Value Date/Time   HGB 12.6 01/31/2018 2152   HGB 14.3 10/06/2017 1130   HCT 37.0 01/31/2018 2152   HCT 43.5 10/06/2017 1130    Troponin (Point of Care Test) Recent Labs    01/31/18  2152  TROPIPOC 0.00      UA ordered   CT HEAD/neck -   NON acute CTA neck - non  acute CXR -  NON acute  CTabd/pelvis -T6 compression FRX 25% loss of height   ECG:  Personally reviewed by me showing: HR : 79 Rhythm:  LBBB,    no evidence of ischemic changes QTC 498    ED Triage Vitals  Enc Vitals Group     BP 01/31/18 2122 (!) 153/92     Pulse Rate 01/31/18 2122 73     Resp 01/31/18 2122 20     Temp 01/31/18 2122 98.2 F (36.8 C)     Temp Source 01/31/18 2122 Oral     SpO2 01/31/18 2122 98 %     Weight 01/31/18 2110 127 lb (57.6 kg)     Height 01/31/18 2110 5\' 3"  (1.6 m)     Head Circumference --      Peak Flow --      Pain Score 01/31/18 2110 10     Pain Loc --      Pain Edu? --      Excl. in [place]? --   TMAX(24)@       Latest  Blood pressure (!) 145/69, pulse 72, temperature 98.2 F (36.8 C), temperature source Oral,  resp. rate 18, height 5\' 3"  (1.6 m), weight 57.6 kg, SpO2 98 %.  Hospitalist was called for admission for  T6 compression fraction with uncontrollable pain    Review of Systems:    Pertinent positives include: fall  chest pain (due to fall)  Constitutional:  No weight loss, night sweats, Fevers, chills, fatigue, weight loss  HEENT:  No headaches, Difficulty swallowing,Tooth/dental problems,Sore throat,  No sneezing, itching, ear ache, nasal congestion, post nasal drip,  Cardio-vascular:  No, Orthopnea, PND, anasarca, dizziness, palpitations.no Bilateral lower extremity swelling  GI:  No heartburn, indigestion, abdominal pain, nausea, vomiting, diarrhea, change in bowel habits, loss of appetite, melena, blood in stool, hematemesis Resp:  no shortness of breath at rest. No dyspnea on exertion, No excess mucus, no productive cough, No non-productive cough, No coughing up of blood.No change in color of mucus.No wheezing. Skin:  no rash or lesions. No jaundice GU:  no dysuria, change in color of urine, no urgency or frequency. No straining to urinate.  No flank pain.  Musculoskeletal:  No joint pain or no joint swelling. No decreased range of motion. No back pain.  Psych:  No change in mood or affect. No depression or anxiety. No memory loss.  Neuro: no localizing neurological complaints, no tingling, no weakness, no double vision, no gait abnormality, no slurred speech, no confusion  All systems reviewed and apart from [place] all are negative  Past Medical History:   Past Medical History:  Diagnosis Date  . Coronary artery disease    POST PTCA AND STENTING OF HER RIGHT CORONARY ARTERY  . Hypothyroidism   . LAD stenosis    MODERATE 50-60% STENOSIS  . Osteoporosis       Past Surgical History:  Procedure Laterality Date  . fractured wrist repair    . KNEE SURGERY     arthroscopic  torn meniscus  . LUMBAR LAMINECTOMY     years  ago  . ovarian cyst rupture and appy    .  RIGHT/LEFT HEART CATH AND CORONARY ANGIOGRAPHY N/A 10/08/2017   Procedure: RIGHT/LEFT HEART CATH AND CORONARY ANGIOGRAPHY;  Surgeon: Nelva Bush, MD;  Location: [facility];  Service: Cardiovascular;  Laterality: N/A;    Social History:  Ambulatory independently       reports that she has never smoked. She has never used smokeless tobacco. She reports that she drinks alcohol. She reports that she does not use drugs.     Family History:   Family History  Problem Relation Age of Onset  . Alzheimer's disease Mother   . Heart attack Father   . Aneurysm Sister     Allergies: No Known Allergies   Prior to Admission medications   Medication Sig Start Date End Date Taking? Authorizing Provider  Calcium Carbonate-Vitamin D (CALCIUM + D PO) Take 1 tablet by mouth at bedtime.     [provider]  carvedilol (COREG) 3.125 MG tablet Take 1 tablet (3.125 mg total) by mouth 2 (two) times daily. 10/08/17 10/08/18  End, Harrell Gave, MD  Cholecalciferol (VITAMIN D3) 2000 units TABS Take 2,000 Units by mouth daily.    [provider]  clopidogrel (PLAVIX) 75 MG tablet TAKE 1 TABLET BY MOUTH ONCE DAILY 10/04/17   Nahser, Wonda Cheng, MD  levothyroxine (SYNTHROID, LEVOTHROID) 75 MCG tablet Take 1 tablet by mouth daily. 11/30/17   [provider]  multivitamin-lutein (OCUVITE-LUTEIN) CAPS capsule Take 1 capsule daily by mouth.    [provider]  nitroGLYCERIN (NITROSTAT) 0.4 MG SL tablet Place 1 tablet (0.4 mg total) under the tongue every 5 (five) minutes as needed. 06/22/11   Nahser, Wonda Cheng, MD  Omega-3 Fatty Acids (FISH OIL) 1200 MG CAPS Take 1,200 mg by mouth at bedtime.    [provider]  rosuvastatin (CRESTOR) 40 MG tablet TAKE 1 TABLET BY MOUTH ONCE DAILY 01/05/18   Nahser, Wonda Cheng, MD  venlafaxine XR (EFFEXOR-XR) 150 MG 24 hr capsule TAKE 1 CAPSULE BY MOUTH ONCE DAILY 06/22/17   Nahser, Wonda Cheng, MD  vitamin B-12 (CYANOCOBALAMIN) 1000 MCG tablet  Take 2 tablets by mouth daily.    [provider]   Physical Exam: Blood pressure (!) 145/69, pulse 72, temperature 98.2 F (36.8 C), temperature source Oral, resp. rate 18, height 5\' 3"  (1.6 m), weight 57.6 kg, SpO2 98 %. 1. General:  in No Acute distress  well  -appearing 2. Psychological: Alert and   Oriented 3. Head/ENT:    Dry Mucous Membranes                          Head Non traumatic, neck supple                          Poor Dentition 4. SKIN:  decreased Skin turgor,  Skin clean Dry and intact no rash 5. Heart: Regular rate and rhythm no  Murmur, no Rub or gallop 6. Lungs:  no wheezes or crackles   7. Abdomen: Soft,  non-tender, Non distended  bowel sounds present 8. Lower extremities: no clubbing, cyanosis, or edema 9. Neurologically Grossly intact, moving all 4 extremities equally  10. MSK: Normal range of motion   LABS:     Recent Labs  Lab 01/31/18 2145 01/31/18 2152  WBC 7.6  --   NEUTROABS 4.8  --   HGB 12.2 12.6  HCT 37.0 37.0  MCV 98.7  --   PLT 183  --    Basic Metabolic Panel: Recent Labs  Lab 01/31/18 2145 01/31/18 2152  NA 137 140  K 3.1* 3.1*  CL 109 107  CO2 20*  --  GLUCOSE 101* 93  BUN 15 16  CREATININE 0.84 0.70  CALCIUM 8.3*  --       Recent Labs  Lab 01/31/18 2145  AST 23  ALT 16  ALKPHOS 53  BILITOT 0.3  PROT 5.9*  ALBUMIN 3.4*   No results for input(s): LIPASE, AMYLASE in the last 168 hours. No results for input(s): AMMONIA in the last 168 hours.    HbA1C: No results for input(s): HGBA1C in the last 72 hours. CBG: No results for input(s): GLUCAP in the last 168 hours.    Urine analysis:    Component Value Date/Time   COLORURINE YELLOW 07/13/2011 2036   APPEARANCEUR CLOUDY (A) 07/13/2011 2036   LABSPEC 1.006 07/13/2011 2036   PHURINE 7.5 07/13/2011 2036   GLUCOSEU NEGATIVE 07/13/2011 2036   HGBUR NEGATIVE 07/13/2011 2036   BILIRUBINUR NEGATIVE 07/13/2011 2036   KETONESUR NEGATIVE 07/13/2011 2036    PROTEINUR NEGATIVE 07/13/2011 2036   UROBILINOGEN 0.2 07/13/2011 2036   NITRITE NEGATIVE 07/13/2011 2036   LEUKOCYTESUR SMALL (A) 07/13/2011 2036       Cultures: No results found for: SDES, SPECREQUEST, CULT, REPTSTATUS   Radiological Exams on Admission: Ct Head Wo Contrast  Result Date: 01/31/2018 CLINICAL DATA:  Trip and fall on to table. History of pituitary adenoma. EXAM: CT HEAD WITHOUT CONTRAST CT CERVICAL SPINE WITHOUT CONTRAST TECHNIQUE: Multidetector CT imaging of the head and cervical spine was performed following the standard protocol without intravenous contrast. Multiplanar CT image reconstructions of the cervical spine were also generated. COMPARISON:  MRI head January 04, 2018 FINDINGS: CT HEAD FINDINGS BRAIN: No intraparenchymal hemorrhage, mass effect nor midline shift. The ventricles and sulci are normal for age. Old basal ganglia lacunar infarcts. Patchy supratentorial white matter hypodensities less than expected for patient's age, though non-specific are most compatible with chronic small vessel ischemic disease. No acute large vascular territory infarcts. No abnormal extra-axial fluid collections. Basal cisterns are patent. VASCULAR: Mild calcific atherosclerosis of the carotid siphons. SKULL: No skull fracture. Osteopenia. No significant scalp soft tissue swelling. SINUSES/ORBITS: Severe chronic RIGHT maxillary sinusitis with bony remodeling. Mastoid air cells are well aerated.The included ocular globes and orbital contents are non-suspicious. Status post bilateral ocular lens implants. OTHER: None. CT CERVICAL SPINE FINDINGS ALIGNMENT: Straightened lordosis.  Vertebral bodies in alignment. SKULL BASE AND VERTEBRAE: Cervical vertebral bodies and posterior elements are intact. Severe C3-4 disc height loss with endplate spurring compatible with degenerative disc. No destructive bony lesions. C1-2 articulation maintained. SOFT TISSUES AND SPINAL CANAL: Nonacute. DISC LEVELS:  Moderate canal stenosis C3-4. Severe bilateral C3-4 and LEFT C6-7 neural foraminal narrowing. UPPER CHEST: Lung apices are clear. OTHER: None. IMPRESSION: CT HEAD: 1. No acute intracranial process. 2. Mild chronic small vessel ischemic changes and old basal ganglia lacunar infarcts. CT cervical spine: 1. No fracture or malalignment. 2. Moderate canal stenosis C3-4. Severe bilateral C3-4 and LEFT C6-7 neural foraminal narrowing. Electronically Signed   By: Elon Alas M.D.   On: 01/31/2018 23:52   Ct Angio Neck W And/or Wo Contrast  Result Date: 01/31/2018 CLINICAL DATA:  Blunted neck trauma.  Trip and fall onto table. EXAM: CT ANGIOGRAPHY NECK TECHNIQUE: Multidetector CT imaging of the neck was performed using the standard protocol during bolus administration of intravenous contrast. Multiplanar CT image reconstructions and MIPs were obtained to evaluate the vascular anatomy. Carotid stenosis measurements (when applicable) are obtained utilizing NASCET criteria, using the distal internal carotid diameter as the denominator. CONTRAST:  165mL ISOVUE-370 IOPAMIDOL (ISOVUE-370) INJECTION 76%  COMPARISON:  None. FINDINGS: AORTIC ARCH: Normal appearance of the thoracic arch, normal branch pattern. Mild calcific atherosclerosis aortic arch. The origins of the innominate, left Common carotid artery and subclavian artery are widely patent; patulous LEFT subclavian artery origin. RIGHT CAROTID SYSTEM: Common carotid artery is patent, tortuous course. Calcific atherosclerosis resulting in less than 50% stenosis by NASCET criteria. Slight linear filling defect. Patent internal carotid artery, focally beaded and tortuous. LEFT CAROTID SYSTEM: Common carotid artery is patent. Severe calcific atherosclerosis distal common carotid artery and LEFT internal carotid artery origin with less than 50% stenosis by NASCET criteria. Patent internal carotid artery, beaded tortuous cervical internal carotid artery. VERTEBRAL  ARTERIES:Codominant vertebral arteries. Moderate stenosis LEFT vertebral artery. Luminal irregularity bilateral vertebral arteries. SKELETON: No acute osseous process though bone windows have not been submitted. OTHER NECK: RIGHT clavicular soft tissue swelling, possible contusion. UPPER CHEST: Please see dedicated CT of chest from same day, reported separately. IMPRESSION: 1. No acute vascular process. 2. Less than 50% stenosis RIGHT ICA origin. Small non flow-limiting RIGHT ICA web or old dissection without pseudoaneurysm. 3. 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 4. Moderate stenosis LEFT vertebral artery origin. Bilateral vertebral artery atherosclerosis or FMD. Aortic Atherosclerosis (ICD10-I70.0). Electronically Signed   By: Elon Alas M.D.   On: 01/31/2018 23:31   Ct Chest W Contrast  Result Date: 01/31/2018 CLINICAL DATA:  Trauma EXAM: CT CHEST, ABDOMEN, AND PELVIS WITH CONTRAST TECHNIQUE: Multidetector CT imaging of the chest, abdomen and pelvis was performed following the standard protocol during bolus administration of intravenous contrast. CONTRAST:  169mL ISOVUE-370 IOPAMIDOL (ISOVUE-370) INJECTION 76% COMPARISON:  Chest x-ray 01/31/2018 FINDINGS: CT CHEST FINDINGS Cardiovascular: Nonaneurysmal aorta. Mild aortic atherosclerosis. Coronary vascular calcification. Normal heart size. No pericardial effusion. Mediastinum/Nodes: No enlarged mediastinal, hilar, or axillary lymph nodes. Thyroid gland, trachea, and esophagus demonstrate no significant findings. Lungs/Pleura: Mild blebs at the lung bases. No acute consolidation, pleural effusion or pneumothorax. Musculoskeletal: Possible acute to subacute compression fracture T6 with about 25% loss of height of the anterior vertebral body. Sternum is intact. CT ABDOMEN PELVIS FINDINGS Hepatobiliary: Cyst in the left hepatic lobe. No calcified gallstone or biliary dilatation Pancreas: Unremarkable. No pancreatic ductal  dilatation or surrounding inflammatory changes. Spleen: No splenic injury or perisplenic hematoma. Adrenals/Urinary Tract: Adrenal glands are within normal limits. No hydronephrosis. 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 Prominent right extrarenal pelvis. Dilated urinary bladder Stomach/Bowel: Stomach is within normal limits. No evidence of bowel wall thickening, distention, or inflammatory changes. Vascular/Lymphatic: Nonaneurysmal aorta. Moderate aortic atherosclerosis. No significantly enlarged lymph nodes. Reproductive: Uterus and bilateral adnexa are unremarkable. Other: Negative for free air or free fluid. Musculoskeletal: No acute or significant osseous findings. Degenerative changes IMPRESSION: 1. No CT evidence for acute intrathoracic, intra-abdominal or intrapelvic abnormality. 2. Possible acute to subacute compression fracture T6 with about 25% loss of height of the anterior vertebral body 3. Ectopic right pelvic kidney Electronically Signed   By: Donavan Foil M.D.   On: 01/31/2018 23:28   Ct Abdomen Pelvis W Contrast  Result Date: 01/31/2018 CLINICAL DATA:  Trauma EXAM: CT CHEST, ABDOMEN, AND PELVIS WITH CONTRAST TECHNIQUE: Multidetector CT imaging of the chest, abdomen and pelvis was performed following the standard protocol during bolus administration of intravenous contrast. CONTRAST:  146mL ISOVUE-370 IOPAMIDOL (ISOVUE-370) INJECTION 76% COMPARISON:  Chest x-ray 01/31/2018 FINDINGS: CT CHEST FINDINGS Cardiovascular: Nonaneurysmal aorta. Mild aortic atherosclerosis. Coronary vascular calcification. Normal heart size. No pericardial effusion. Mediastinum/Nodes: No enlarged mediastinal, hilar, or axillary lymph nodes.  Thyroid gland, trachea, and esophagus demonstrate no significant findings. Lungs/Pleura: Mild blebs at the lung bases. No acute consolidation, pleural effusion or pneumothorax. Musculoskeletal: Possible acute to subacute compression fracture T6 with about 25% loss of height  of the anterior vertebral body. Sternum is intact. CT ABDOMEN PELVIS FINDINGS Hepatobiliary: Cyst in the left hepatic lobe. No calcified gallstone or biliary dilatation Pancreas: Unremarkable. No pancreatic ductal dilatation or surrounding inflammatory changes. Spleen: No splenic injury or perisplenic hematoma. Adrenals/Urinary Tract: Adrenal glands are within normal limits. No hydronephrosis. 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 Prominent right extrarenal pelvis. Dilated urinary bladder Stomach/Bowel: Stomach is within normal limits. No evidence of bowel wall thickening, distention, or inflammatory changes. Vascular/Lymphatic: Nonaneurysmal aorta. Moderate aortic atherosclerosis. No significantly enlarged lymph nodes. Reproductive: Uterus and bilateral adnexa are unremarkable. Other: Negative for free air or free fluid. Musculoskeletal: No acute or significant osseous findings. Degenerative changes IMPRESSION: 1. No CT evidence for acute intrathoracic, intra-abdominal or intrapelvic abnormality. 2. Possible acute to subacute compression fracture T6 with about 25% loss of height of the anterior vertebral body 3. Ectopic right pelvic kidney Electronically Signed   By: Donavan Foil M.D.   On: 01/31/2018 23:28   Ct C-spine No Charge  Result Date: 01/31/2018 CLINICAL DATA:  Trip and fall on to table. History of pituitary adenoma. EXAM: CT HEAD WITHOUT CONTRAST CT CERVICAL SPINE WITHOUT CONTRAST TECHNIQUE: Multidetector CT imaging of the head and cervical spine was performed following the standard protocol without intravenous contrast. Multiplanar CT image reconstructions of the cervical spine were also generated. COMPARISON:  MRI head January 04, 2018 FINDINGS: CT HEAD FINDINGS BRAIN: No intraparenchymal hemorrhage, mass effect nor midline shift. The ventricles and sulci are normal for age. Old basal ganglia lacunar infarcts. Patchy supratentorial white matter hypodensities less than expected for  patient's age, though non-specific are most compatible with chronic small vessel ischemic disease. No acute large vascular territory infarcts. No abnormal extra-axial fluid collections. Basal cisterns are patent. VASCULAR: Mild calcific atherosclerosis of the carotid siphons. SKULL: No skull fracture. Osteopenia. No significant scalp soft tissue swelling. SINUSES/ORBITS: Severe chronic RIGHT maxillary sinusitis with bony remodeling. Mastoid air cells are well aerated.The included ocular globes and orbital contents are non-suspicious. Status post bilateral ocular lens implants. OTHER: None. CT CERVICAL SPINE FINDINGS ALIGNMENT: Straightened lordosis.  Vertebral bodies in alignment. SKULL BASE AND VERTEBRAE: Cervical vertebral bodies and posterior elements are intact. Severe C3-4 disc height loss with endplate spurring compatible with degenerative disc. No destructive bony lesions. C1-2 articulation maintained. SOFT TISSUES AND SPINAL CANAL: Nonacute. DISC LEVELS: Moderate canal stenosis C3-4. Severe bilateral C3-4 and LEFT C6-7 neural foraminal narrowing. UPPER CHEST: Lung apices are clear. OTHER: None. IMPRESSION: CT HEAD: 1. No acute intracranial process. 2. Mild chronic small vessel ischemic changes and old basal ganglia lacunar infarcts. CT cervical spine: 1. No fracture or malalignment. 2. Moderate canal stenosis C3-4. Severe bilateral C3-4 and LEFT C6-7 neural foraminal narrowing. Electronically Signed   By: Elon Alas M.D.   On: 01/31/2018 23:52   Dg Chest Port 1 View  Result Date: 01/31/2018 CLINICAL DATA:  Chest pain after trip and fall striking table. EXAM: PORTABLE CHEST 1 VIEW COMPARISON:  08/16/2010 FINDINGS: Lower lung volumes from prior exam lead to bronchovascular crowding. Mild cardiomegaly is similar allowing for differences in technique, likely accentuated by low lung volumes. Unchanged mediastinal contours. No pneumothorax or focal airspace disease. No large pleural effusion. The bones  are under mineralized. No acute osseous abnormalities are seen. IMPRESSION:  Low lung volumes without acute abnormality. Electronically Signed   By: Keith Rake M.D.   On: 01/31/2018 22:23   Dg Knee Complete 4 Views Left  Result Date: 01/31/2018 CLINICAL DATA:  Trip and fall into table with bilateral knee pain. EXAM: LEFT KNEE - COMPLETE 4+ VIEW COMPARISON:  None. FINDINGS: No fracture or dislocation. No significant joint effusion. There is prepatellar soft tissue edema. Chondrocalcinosis of the medial and lateral tibiofemoral compartments with mild tricompartmental joint space narrowing and peripheral spurring. The bones are under mineralized. IMPRESSION: 1. Prepatellar soft tissue edema. No fracture or dislocation. 2. Chondrocalcinosis and mild tricompartmental osteoarthritis. Electronically Signed   By: Keith Rake M.D.   On: 01/31/2018 22:26   Dg Knee Complete 4 Views Right  Result Date: 01/31/2018 CLINICAL DATA:  Trip and fall into table with bilateral knee pain. EXAM: RIGHT KNEE - COMPLETE 4+ VIEW COMPARISON:  None. FINDINGS: No fracture or dislocation. No significant joint effusion. There is prepatellar soft tissue edema. Medial and lateral tibiofemoral chondrocalcinosis. Medial joint space narrowing with peripheral spurring. Small quadriceps tendon enthesophyte. IMPRESSION: 1. Prepatellar soft tissue edema.  No fracture or dislocation. 2. Mild osteoarthritis and chondrocalcinosis. Electronically Signed   By: Keith Rake M.D.   On: 01/31/2018 22:27    Chart has been reviewed    Assessment/Plan  84 y.o. female with medical history significant of CAD, chronic systolic LV dysfunction, Carotid vascular disease, and dyslipidemia.LBBB hypothyroidism Admitted for fall resulting in T6 compression fraction with uncontrollable pain    Present on Admission:  . Closed wedge compression fracture of T6 vertebra (HCC) - TLSO brace, pain control, pt eval and if no improvement may need IT  consult  IV pain meds, IR consult for possible proceedure . Chest pain - secondary to fall, likely due to t6 compression fracture . Hypokalemia - - will replace and repeat in AM,  check magnesium level and replace as needed  . Arteriosclerosis of coronary artery - - on Plavix, last stents were 10 years ago. Will hold in case would benefit from IR proceedure . Hyperlipidemia - continue statin . Hypothyroidism - - Check TSH continue home medications at current dose   Other plan as per orders.  DVT prophylaxis:  SCD   Code Status:  FULL CODE  as per patient   I had personally discussed CODE STATUS with patient and family     Family Communication:   Family   at  Bedside  plan of care was discussed with   Daughter,  Disposition Plan:     likely will need placement for rehabilitation                                          Would benefit from PT/OT eval prior to DC  Ordered                                      Consults called: none    Admission status:    Obs    Level of care    medical floor            Toy Baker 02/01/2018, 1:22 AM    [facility]  Pager [phone]   after 2 AM please page floor coverage PA If 7AM-7PM, please contact the day team taking care of the  patient  [email or web address]  Password [email or web address]

## 2018-02-01 NOTE — Evaluation (Signed)
Physical Therapy Evaluation Patient Details Name: Susan [name] MRN: [id number] DOB: 09-20-1933 Today's Date: 02/01/2018   History of Present Illness  Pt is an 84 y/o female who presents s/p fall at home. Pt hit her R knee and top of her chin on the cofee table and sustained a T6 fracture. C-spine cleared. PMH significant for osteoporosis, LAD stenosis, hypothyroidism, CAD, right/left heart cath and coronary angiography 10/08/2017, lumbar laminectomy, fractured wrist repair.   Clinical Impression  Pt admitted with above diagnosis. Pt currently with functional limitations due to the deficits listed below (see PT Problem List). At the time of PT eval pt was able to perform transfers and minimal ambulation with up to +2 mod assist for balance support and safety. Pt with high pain today and was given pain medication prior to eval, causing lethargy. It was difficult to discern what the home set up is and if pt will have good support at d/c. Apparently pt was very independent and even playing tennis PTA. Recommending CIR consult to maximize functional independence and return to PLOF. Pt will benefit from skilled PT to increase their independence and safety with mobility to allow discharge to the venue listed below.       Follow Up Recommendations CIR;Supervision/Assistance - 24 hour    Equipment Recommendations  Rolling walker with 5" wheels    Recommendations for Other Services Rehab consult     Precautions / Restrictions Precautions Precautions: Fall;Back Precaution Booklet Issued: No Precaution Comments: Discussed log roll technique during functional mobility and educated Stage manager while in room. Will need further education regarding back precautions when pt is more awake.  Required Braces or Orthoses: Spinal Brace Spinal Brace: Thoracolumbosacral orthotic;Applied in sitting position(Ok to remove for showering) Restrictions Weight Bearing Restrictions: No      Mobility  Bed  Mobility Overal bed mobility: Needs Assistance Bed Mobility: Rolling;Sidelying to Sit;Sit to Sidelying Rolling: Min assist Sidelying to sit: Mod assist;+2 for physical assistance     Sit to sidelying: Mod assist;+2 for safety/equipment General bed mobility comments: VC's for log roll technique. Physical assist required for proper sequencing.   Transfers Overall transfer level: Needs assistance Equipment used: Rolling walker (2 wheeled) Transfers: Sit to/from Stand Sit to Stand: Min assist;+2 physical assistance;+2 safety/equipment         General transfer comment: Pt initially stood without RW. Held to both therapists for support (likely due to pain) and then RW was brought up to her. Noted posterior lean and difficulty achieving full hip extension - again likely due to pain.   Ambulation/Gait Ambulation/Gait assistance: Min assist;+2 safety/equipment Gait Distance (Feet): 5 Feet Assistive device: Rolling walker (2 wheeled) Gait Pattern/deviations: Step-to pattern;Decreased stride length;Trunk flexed Gait velocity: Decreased Gait velocity interpretation: <1.8 ft/sec, indicate of risk for recurrent falls General Gait Details: Pt was able to take several side steps up towards [facility] with assist for balance support, safety, and walker management.   Stairs            Wheelchair Mobility    Modified Rankin (Stroke Patients Only)       Balance Overall balance assessment: Needs assistance Sitting-balance support: Feet supported;No upper extremity supported Sitting balance-Leahy Scale: Fair Sitting balance - Comments: posterior lean but was able to sit unsupported EOB Postural control: Posterior lean Standing balance support: Bilateral upper extremity supported Standing balance-Leahy Scale: Poor Standing balance comment: Reliance on BUE support at this time.  Pertinent Vitals/Pain Pain Assessment: 0-10 Pain Score: 9  Pain  Location: Back Pain Descriptors / Indicators: Discomfort;Grimacing Pain Intervention(s): Limited activity within patient's tolerance;Monitored during session;Repositioned    Home Living Family/patient expects to be discharged to:: Private residence Living Arrangements: Alone               Additional Comments: Pt had pain medication prior to PT session and was very drowsy. Unsure about home set up or available assistance from family/friends/hired caregivers available at d/c.     Prior Function Level of Independence: Independent         Comments: PTA pt very independent. Apparently pt plays tennis and works in her yard.      Hand Dominance        Extremity/Trunk Assessment   Upper Extremity Assessment Upper Extremity Assessment: Defer to OT evaluation    Lower Extremity Assessment Lower Extremity Assessment: RLE deficits/detail RLE Deficits / Details: Noted large bruise on R knee and edema in the surrounding area.     Cervical / Trunk Assessment Cervical / Trunk Assessment: Other exceptions Cervical / Trunk Exceptions: T6 fracture  Communication   Communication: No difficulties  Cognition Arousal/Alertness: Lethargic;Suspect due to medications Behavior During Therapy: Flat affect Overall Cognitive Status: Within Functional Limits for tasks assessed                                        General Comments      Exercises     Assessment/Plan    PT Assessment Patient needs continued PT services  PT Problem List Decreased strength;Decreased range of motion;Decreased activity tolerance;Decreased balance;Decreased mobility;Decreased knowledge of use of DME;Decreased safety awareness;Decreased knowledge of precautions;Pain       PT Treatment Interventions DME instruction;Stair training;Gait training;Functional mobility training;Therapeutic activities;Therapeutic exercise;Neuromuscular re-education;Patient/family education    PT Goals (Current  goals can be found in the Care Plan section)  Acute Rehab PT Goals Patient Stated Goal: None stated throughout session PT Goal Formulation: With patient Time For Goal Achievement: 02/15/18 Potential to Achieve Goals: Good    Frequency Min 4X/week   Barriers to discharge        Co-evaluation PT/OT/SLP Co-Evaluation/Treatment: Yes Reason for Co-Treatment: For patient/therapist safety;To address functional/ADL transfers PT goals addressed during session: Mobility/safety with mobility;Balance;Proper use of DME         AM-PAC PT "6 Clicks" Daily Activity  Outcome Measure Difficulty turning over in bed (including adjusting bedclothes, sheets and blankets)?: Unable Difficulty moving from lying on back to sitting on the side of the bed? : Unable Difficulty sitting down on and standing up from a chair with arms (e.g., wheelchair, bedside commode, etc,.)?: Unable Help needed moving to and from a bed to chair (including a wheelchair)?: A Lot Help needed walking in hospital room?: A Lot Help needed climbing 3-5 steps with a railing? : Total 6 Click Score: 8    End of Session Equipment Utilized During Treatment: Gait belt;Back brace Activity Tolerance: Patient limited by lethargy;Patient limited by pain Patient left: in bed;with call bell/phone within reach;with nursing/sitter in room Nurse Communication: Mobility status PT Visit Diagnosis: Unsteadiness on feet (R26.81);Pain;Difficulty in walking, not elsewhere classified (R26.2) Pain - part of body: (back)    Time: 4332-9518 PT Time Calculation (min) (ACUTE ONLY): 15 min   Charges:   PT Evaluation $PT Eval Moderate Complexity: 1 Mod  Susan [name], PT, DPT Acute Rehabilitation Services Pager: [phone] Office: [phone]   Susan [name] 02/01/2018, 12:54 PM

## 2018-02-02 DIAGNOSIS — G43901 Migraine, unspecified, not intractable, with status migrainosus: Secondary | ICD-10-CM

## 2018-02-02 DIAGNOSIS — R5381 Other malaise: Secondary | ICD-10-CM | POA: Diagnosis not present

## 2018-02-02 DIAGNOSIS — S22050K Wedge compression fracture of T5-T6 vertebra, subsequent encounter for fracture with nonunion: Secondary | ICD-10-CM | POA: Diagnosis not present

## 2018-02-02 DIAGNOSIS — S22050A Wedge compression fracture of T5-T6 vertebra, initial encounter for closed fracture: Principal | ICD-10-CM

## 2018-02-02 DIAGNOSIS — R0781 Pleurodynia: Secondary | ICD-10-CM | POA: Diagnosis not present

## 2018-02-02 DIAGNOSIS — I429 Cardiomyopathy, unspecified: Secondary | ICD-10-CM | POA: Diagnosis not present

## 2018-02-02 DIAGNOSIS — W19XXXA Unspecified fall, initial encounter: Secondary | ICD-10-CM | POA: Diagnosis not present

## 2018-02-02 LAB — BASIC METABOLIC PANEL
ANION GAP: 6 (ref 5–15)
BUN: 10 mg/dL (ref 8–23)
CALCIUM: 9.2 mg/dL (ref 8.9–10.3)
CO2: 26 mmol/L (ref 22–32)
Chloride: 102 mmol/L (ref 98–111)
Creatinine, Ser: 0.9 mg/dL (ref 0.44–1.00)
GFR calc Af Amer: >60 mL/min (ref >60)
GFR, EST NON AFRICAN AMERICAN: 58 mL/min — AB (ref >60)
Glucose, Bld: 92 mg/dL (ref 70–99)
Potassium: 4.7 mmol/L (ref 3.5–5.1)
SODIUM: 134 mmol/L — AB (ref 135–145)

## 2018-02-02 MED ORDER — SUMATRIPTAN SUCCINATE 6 MG/0.5ML ~~LOC~~ SOLN
6.0000 mg | Freq: Once | SUBCUTANEOUS | Status: AC
  Administered 2018-02-02: 6 mg via SUBCUTANEOUS
  Filled 2018-02-02: qty 0.5

## 2018-02-02 MED ORDER — VALPROATE SODIUM 500 MG/5ML IV SOLN
1000.0000 mg | Freq: Once | INTRAVENOUS | Status: AC
  Administered 2018-02-02: 1000 mg via INTRAVENOUS
  Filled 2018-02-02: qty 10

## 2018-02-02 MED ORDER — SUMATRIPTAN SUCCINATE 25 MG PO TABS
25.0000 mg | ORAL_TABLET | ORAL | Status: DC | PRN
  Filled 2018-02-02: qty 1

## 2018-02-02 MED ORDER — SODIUM CHLORIDE 0.9 % IV SOLN
250.0000 mg | Freq: Once | INTRAVENOUS | Status: AC
  Administered 2018-02-02: 250 mg via INTRAVENOUS
  Filled 2018-02-02: qty 2

## 2018-02-02 NOTE — Progress Notes (Signed)
PROGRESS NOTE    Susan [name]  AGT:[id number] DOB: 05/27/1933 DOA: 01/31/2018 PCP: Crist Infante, MD    Brief Narrative:  84 year old female who presented after a mechanical fall.  She does have the significant past medical history for coronary artery disease, chronic diastolic heart failure, carotid vascular disease, dyslipidemia and hypothyroidism.  Patient sustained a mechanical fall from her own height, trauma to her chest and back, no head trauma loss of consciousness.  On her initial physical examination blood pressure 145/69, heart rate 72, temperature 98.2, respiratory rate 18, oxygen saturation 98%.  Her lungs were clear to auscultation bilaterally, heart S1-S2 present rhythmic, abdomen soft nontender, no lower extremity edema.  CT of the abdomen showed T6 subacute compression fracture with about 25% loss of height of the anterior vertebral body.   Patient was admitted to the hospital with working diagnosis of T6 subacute compression fracture.  02/02/2018: Patient reports severe migrainous symptoms.  Will treat patient's migraine acutely.  Assessment & Plan:   Active Problems:   Arteriosclerosis of coronary artery   Hypothyroidism   Hyperlipidemia   Cardiomyopathy (HCC)   Hypokalemia   Closed wedge compression fracture of T6 vertebra (HCC)   Chest pain   Coronary artery disease   1.  T6 acute/ subacute compression fracture. Patient with significant pain, worse with movement, will continue pain control with hydromorphone IV, methocarbamol, and tramadol. Follow on MRI result and IR recommendations, possible hypoplasty. Physical therapy evaluation, recommendation for CIR.  02/02/2018: For possible vertebroplasty in the morning.  2. Dyslipidemia. Continue rosuvastatin.   3. Hypothyroid. On levothyroxine.   4. Hypertension. Will continue blood pressure control with carvedilol.  5. Hypokalemia. K at 4.7 today.    6.  Acute migraine: IV steroids IV Depakote  Subcutaneous  Imitrex x1 dose. Oral Imitrex as needed. Tylenol as needed. Further management depend on hospital course.   DVT prophylaxis: scd   Code Status: full Family Communication: I spoke with patient's family at the bedside and all questions were addressed.  Disposition Plan/ discharge barriers: pending MRI   Consultants:   IR  Procedures:     Antimicrobials:       Subjective: Patient reports severe migrainous symptoms.  Objective: Vitals:   02/01/18 1627 02/01/18 1648 02/01/18 2128 02/02/18 0509  BP:   120/72 112/60  Pulse: 64  64 72  Resp: (!) 24 16 14 16   Temp:   97.9 F (36.6 C) 98.2 F (36.8 C)  TempSrc:   Oral Oral  SpO2: 98%  95% (!) 85%  Weight:      Height:        Intake/Output Summary (Last 24 hours) at 02/02/2018 1306 Last data filed at 02/01/2018 1500 Gross per 24 hour  Intake 480 ml  Output -  Net 480 ml   Filed Weights   01/31/18 2110  Weight: 57.6 kg    Examination:   General: In mild distress secondary to the migraine attack.   Neurology: Awake and alert, non focal  HEENT: mild pallor, no icterus, oral mucosa moist Cardiovascular: S1-S2 Pulmonary: Clear to auscultation.   Gastrointestinal. Abdomen with no organomegaly, non tender, no rebound or guarding   Data Reviewed: I have personally reviewed following labs and imaging studies  CBC: Recent Labs  Lab 01/31/18 2145 01/31/18 2152 02/01/18 0245  WBC 7.6  --  9.7  NEUTROABS 4.8  --   --   HGB 12.2 12.6 13.4  HCT 37.0 37.0 40.8  MCV 98.7  --  97.6  PLT 183  --  453   Basic Metabolic Panel: Recent Labs  Lab 01/31/18 2145 01/31/18 2152 02/01/18 0245 02/02/18 0630  NA 137 140 135 134*  K 3.1* 3.1* 3.6 4.7  CL 109 107 102 102  CO2 20*  --  23 26  GLUCOSE 101* 93 128* 92  BUN 15 16 13 10   CREATININE 0.84 0.70 0.88 0.90  CALCIUM 8.3*  --  9.5 9.2  MG  --   --  2.1  --   PHOS  --   --  4.0  --    GFR: Estimated Creatinine Clearance: 39.9 mL/min (by C-G formula based on  SCr of 0.9 mg/dL). Liver Function Tests: Recent Labs  Lab 01/31/18 2145 02/01/18 0245  AST 23 25  ALT 16 19  ALKPHOS 53 59  BILITOT 0.3 0.6  PROT 5.9* 7.1  ALBUMIN 3.4* 3.9   No results for input(s): LIPASE, AMYLASE in the last 168 hours. No results for input(s): AMMONIA in the last 168 hours. Coagulation Profile: No results for input(s): INR, PROTIME in the last 168 hours. Cardiac Enzymes: No results for input(s): CKTOTAL, CKMB, CKMBINDEX, TROPONINI in the last 168 hours. BNP (last 3 results) No results for input(s): PROBNP in the last 8760 hours. HbA1C: No results for input(s): HGBA1C in the last 72 hours. CBG: No results for input(s): GLUCAP in the last 168 hours. Lipid Profile: No results for input(s): CHOL, HDL, LDLCALC, TRIG, CHOLHDL, LDLDIRECT in the last 72 hours. Thyroid Function Tests: Recent Labs    02/01/18 0245  TSH 0.202*   Anemia Panel: No results for input(s): VITAMINB12, FOLATE, FERRITIN, TIBC, IRON, RETICCTPCT in the last 72 hours.    Radiology Studies: I have reviewed all of the imaging during this hospital visit personally     Scheduled Meds: . carvedilol  3.125 mg Oral BID WC  . levothyroxine  75 mcg Oral QAC breakfast  . potassium chloride  40 mEq Oral Once  . rosuvastatin  40 mg Oral Daily  . senna  1 tablet Oral BID  . SUMAtriptan  6 mg Subcutaneous Once  . venlafaxine XR  150 mg Oral Daily   Continuous Infusions: . methylPREDNISolone (SOLU-MEDROL) injection    . valproate sodium       LOS: 0 days        Bonnell Public, MD [facility] Pager [phone]

## 2018-02-02 NOTE — Consult Note (Signed)
Physical Medicine and Rehabilitation Consult Reason for Consult: Decreased functional mobility Referring Physician: [facility]   HPI: Susan [name] is a 84 y.o. right-handed female with history of CAD with stenting maintained on Plavix, chronic systolic congestive heart failure, left BBB, osteoporosis, lumbar laminectomy 1978.  Per chart review patient lives alone independent and active prior to admission.  She has a daughter in the area that works.  Presented 02/01/2018 after mechanical fall while in the living room when she tripped and fell onto a table hitting her right knee and chin.  Denied loss of consciousness.  Cranial CT scan negative for acute process.  Old basal ganglia lacunar infarcts.  CT cervical spine no fracture or malalignment.  Moderate canal stenosis C3-4.  Severe bilateral C3-4 and left C6-7 neuroforaminal narrowing.  CT of the chest as well as MRI thoracic spine showed possible acute to subacute compression fracture T6 with about 40 % loss of height of the anterior vertebral body.  CT angiogram of the neck with no acute vascular process.  Hospital course pain management.  Interventional radiology consulted for possible vertebroplasty.  Therapy evaluation completed with recommendations of physical medicine rehab consult.  Patient states that she lives by herself and that her daughter in [place] works. Patient states she received IV pain medicine but would like some more.  She is nauseated and having dry heaves occasionally Review of Systems  Constitutional: Negative for chills and fever.  HENT: Negative for hearing loss.   Eyes: Negative for blurred vision and double vision.  Respiratory: Negative for cough and shortness of breath.   Cardiovascular: Negative for chest pain, palpitations and leg swelling.  Gastrointestinal: Positive for constipation. Negative for nausea and vomiting.  Genitourinary: Negative for dysuria, flank pain and hematuria.  Musculoskeletal:  Positive for back pain and falls.  Skin: Negative for rash.  Psychiatric/Behavioral: Positive for depression.  All other systems reviewed and are negative.  Past Medical History:  Diagnosis Date  . Coronary artery disease    POST PTCA AND STENTING OF HER RIGHT CORONARY ARTERY  . Hypothyroidism   . LAD stenosis    MODERATE 50-60% STENOSIS  . Osteoporosis    Past Surgical History:  Procedure Laterality Date  . APPENDECTOMY  1970s?   "ruptured"   . BACK SURGERY    . CATARACT EXTRACTION W/ INTRAOCULAR LENS  IMPLANT, BILATERAL Bilateral   . CORONARY ANGIOPLASTY WITH STENT PLACEMENT  ~ 2009   "2 stents"  . FRACTURE SURGERY    . KNEE ARTHROSCOPY Right     torn meniscus  . LUMBAR LAMINECTOMY  1978  . OVARIAN CYST SURGERY  1970s?   "ruptured"   . RIGHT/LEFT HEART CATH AND CORONARY ANGIOGRAPHY N/A 10/08/2017   Procedure: RIGHT/LEFT HEART CATH AND CORONARY ANGIOGRAPHY;  Surgeon: Nelva Bush, MD;  Location: [facility];  Service: Cardiovascular;  Laterality: N/A;  . WRIST FRACTURE SURGERY Bilateral    "2 surgeries for 2 breaks on left side; 1 surgery for 1 break on right wrist"   Family History  Problem Relation Age of Onset  . Alzheimer's disease Mother   . Heart attack Father   . Aneurysm Sister    Social History:  reports that she has never smoked. She has never used smokeless tobacco. She reports that she drinks about 1.0 standard drinks of alcohol per week. She reports that she does not use drugs. Allergies: No Known Allergies Medications Prior to Admission  Medication Sig Dispense Refill  . Calcium  Carbonate-Vitamin D (CALCIUM + D PO) Take 1 tablet by mouth at bedtime.     . clopidogrel (PLAVIX) 75 MG tablet TAKE 1 TABLET BY MOUTH ONCE DAILY 90 tablet 3  . levothyroxine (SYNTHROID, LEVOTHROID) 75 MCG tablet Take 1 tablet by mouth daily.  0  . multivitamin-lutein (OCUVITE-LUTEIN) CAPS capsule Take 1 capsule daily by mouth.    . nitroGLYCERIN (NITROSTAT) 0.4 MG SL  tablet Place 1 tablet (0.4 mg total) under the tongue every 5 (five) minutes as needed. 25 tablet 3  . Omega-3 Fatty Acids (FISH OIL) 1200 MG CAPS Take 1,200 mg by mouth at bedtime.    . rosuvastatin (CRESTOR) 40 MG tablet TAKE 1 TABLET BY MOUTH ONCE DAILY 90 tablet 3  . venlafaxine XR (EFFEXOR-XR) 150 MG 24 hr capsule TAKE 1 CAPSULE BY MOUTH ONCE DAILY 90 capsule 2  . vitamin B-12 (CYANOCOBALAMIN) 1000 MCG tablet Take 2 tablets by mouth daily.      Home: Home Living Family/patient expects to be discharged to:: Private residence Living Arrangements: Alone Additional Comments: Pt had pain medication prior to PT session and was very drowsy. Unsure about home set up or available assistance from family/friends/hired caregivers available at d/c.   Functional History: Prior Function Level of Independence: Independent Comments: PTA pt very independent. Apparently pt plays tennis and works in her yard.  Functional Status:  Mobility: Bed Mobility Overal bed mobility: Needs Assistance Bed Mobility: Rolling, Sidelying to Sit, Sit to Sidelying Rolling: Min assist Sidelying to sit: Mod assist, +2 for physical assistance Sit to sidelying: Mod assist, +2 for safety/equipment General bed mobility comments: VC's for log roll technique. Physical assist required for proper sequencing.  Transfers Overall transfer level: Needs assistance Equipment used: Rolling walker (2 wheeled) Transfers: Sit to/from Stand Sit to Stand: Min assist, +2 physical assistance, +2 safety/equipment General transfer comment: Pt initially stood without RW. Held to both therapists for support (likely due to pain) and then RW was brought up to her. Noted posterior lean and difficulty achieving full hip extension - again likely due to pain.  Ambulation/Gait Ambulation/Gait assistance: Min assist, +2 safety/equipment Gait Distance (Feet): 5 Feet Assistive device: Rolling walker (2 wheeled) Gait Pattern/deviations: Step-to  pattern, Decreased stride length, Trunk flexed General Gait Details: Pt was able to take several side steps up towards [facility] with assist for balance support, safety, and walker management.  Gait velocity: Decreased Gait velocity interpretation: <1.8 ft/sec, indicate of risk for recurrent falls    ADL: ADL Overall ADL's : Needs assistance/impaired Eating/Feeding: Set up, Supervision/ safety, Sitting Grooming: Set up, Supervision/safety, Sitting Upper Body Bathing: Minimal assistance, Sitting Lower Body Bathing: Moderate assistance, +2 for physical assistance, Sit to/from stand Upper Body Dressing : Maximal assistance, Sitting Upper Body Dressing Details (indicate cue type and reason): Max A to don brace while seated at EOB Lower Body Dressing: Maximal assistance, +2 for physical assistance, Sit to/from stand Lower Body Dressing Details (indicate cue type and reason): Max A for LB dressing. Min A +2 for functional transfers [place] [facility] [place] and Hygiene: Minimal assistance, +2 for physical assistance, Sit to/from stand Toileting - Clothing Manipulation Details (indicate cue type and reason): Min A +2 for sit<>Stand and maintain balance during peri care. Pt performing peri care with cues to bend at knees and prevent bending at back Functional mobility during ADLs: Minimal assistance, +2 for physical assistance, Rolling walker(side steps towards EOB) General ADL Comments: Pt with decreased functional performance and limited by lethargy and pain. Despite pain, pt  motivated to participate in therapy.  Cognition: Cognition Overall Cognitive Status: Within Functional Limits for tasks assessed Orientation Level: Oriented X4 Cognition Arousal/Alertness: Lethargic, Suspect due to medications Behavior During Therapy: Flat affect Overall Cognitive Status: Within Functional Limits for tasks assessed General Comments: Following simple commands. Requiring significant amount of time.  Suspected due to medication prior to session  Blood pressure 112/60, pulse 72, temperature 98.2 F (36.8 C), temperature source Oral, resp. rate 16, height 5\' 3"  (1.6 m), weight 57.6 kg, SpO2 (!) 85 %. Physical Exam  Vitals reviewed. Constitutional: She is oriented to person, place, and time.  HENT:  Head: Normocephalic.  Eyes: EOM are normal.  Neck: Normal range of motion. Neck supple. No thyromegaly present.  Cardiovascular: Normal rate and regular rhythm.  Respiratory: Effort normal and breath sounds normal. No respiratory distress.  GI: Soft. Bowel sounds are normal. She exhibits no distension.  Neurological: She is alert and oriented to person, place, and time.  4/5 strength bilateral deltoid bicep tricep grip, decreased effort patient feels nauseated 3- at the hip flexor knee extensor ankle dorsiflexor, lethargy and nausea limiting cooperation Oriented to person place and situation time Sensation intact light touch bilateral lower extremities No results found for this or any previous visit (from the past 24 hour(s)). Ct Head Wo Contrast  Result Date: 01/31/2018 CLINICAL DATA:  Trip and fall on to table. History of pituitary adenoma. EXAM: CT HEAD WITHOUT CONTRAST CT CERVICAL SPINE WITHOUT CONTRAST TECHNIQUE: Multidetector CT imaging of the head and cervical spine was performed following the standard protocol without intravenous contrast. Multiplanar CT image reconstructions of the cervical spine were also generated. COMPARISON:  MRI head January 04, 2018 FINDINGS: CT HEAD FINDINGS BRAIN: No intraparenchymal hemorrhage, mass effect nor midline shift. The ventricles and sulci are normal for age. Old basal ganglia lacunar infarcts. Patchy supratentorial white matter hypodensities less than expected for patient's age, though non-specific are most compatible with chronic small vessel ischemic disease. No acute large vascular territory infarcts. No abnormal extra-axial fluid collections. Basal  cisterns are patent. VASCULAR: Mild calcific atherosclerosis of the carotid siphons. SKULL: No skull fracture. Osteopenia. No significant scalp soft tissue swelling. SINUSES/ORBITS: Severe chronic RIGHT maxillary sinusitis with bony remodeling. Mastoid air cells are well aerated.The included ocular globes and orbital contents are non-suspicious. Status post bilateral ocular lens implants. OTHER: None. CT CERVICAL SPINE FINDINGS ALIGNMENT: Straightened lordosis.  Vertebral bodies in alignment. SKULL BASE AND VERTEBRAE: Cervical vertebral bodies and posterior elements are intact. Severe C3-4 disc height loss with endplate spurring compatible with degenerative disc. No destructive bony lesions. C1-2 articulation maintained. SOFT TISSUES AND SPINAL CANAL: Nonacute. DISC LEVELS: Moderate canal stenosis C3-4. Severe bilateral C3-4 and LEFT C6-7 neural foraminal narrowing. UPPER CHEST: Lung apices are clear. OTHER: None. IMPRESSION: CT HEAD: 1. No acute intracranial process. 2. Mild chronic small vessel ischemic changes and old basal ganglia lacunar infarcts. CT cervical spine: 1. No fracture or malalignment. 2. Moderate canal stenosis C3-4. Severe bilateral C3-4 and LEFT C6-7 neural foraminal narrowing. Electronically Signed   By: Elon Alas M.D.   On: 01/31/2018 23:52   Ct Angio Neck W And/or Wo Contrast  Result Date: 01/31/2018 CLINICAL DATA:  Blunted neck trauma.  Trip and fall onto table. EXAM: CT ANGIOGRAPHY NECK TECHNIQUE: Multidetector CT imaging of the neck was performed using the standard protocol during bolus administration of intravenous contrast. Multiplanar CT image reconstructions and MIPs were obtained to evaluate the vascular anatomy. Carotid stenosis measurements (when applicable) are obtained utilizing  NASCET criteria, using the distal internal carotid diameter as the denominator. CONTRAST:  144mL ISOVUE-370 IOPAMIDOL (ISOVUE-370) INJECTION 76% COMPARISON:  None. FINDINGS: AORTIC ARCH: Normal  appearance of the thoracic arch, normal branch pattern. Mild calcific atherosclerosis aortic arch. The origins of the innominate, left Common carotid artery and subclavian artery are widely patent; patulous LEFT subclavian artery origin. RIGHT CAROTID SYSTEM: Common carotid artery is patent, tortuous course. Calcific atherosclerosis resulting in less than 50% stenosis by NASCET criteria. Slight linear filling defect. Patent internal carotid artery, focally beaded and tortuous. LEFT CAROTID SYSTEM: Common carotid artery is patent. Severe calcific atherosclerosis distal common carotid artery and LEFT internal carotid artery origin with less than 50% stenosis by NASCET criteria. Patent internal carotid artery, beaded tortuous cervical internal carotid artery. VERTEBRAL ARTERIES:Codominant vertebral arteries. Moderate stenosis LEFT vertebral artery. Luminal irregularity bilateral vertebral arteries. SKELETON: No acute osseous process though bone windows have not been submitted. OTHER NECK: RIGHT clavicular soft tissue swelling, possible contusion. UPPER CHEST: Please see dedicated CT of chest from same day, reported separately. IMPRESSION: 1. No acute vascular process. 2. Less than 50% stenosis RIGHT ICA origin. Small non flow-limiting RIGHT ICA web or old dissection without pseudoaneurysm. 3. 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 4. Moderate stenosis LEFT vertebral artery origin. Bilateral vertebral artery atherosclerosis or FMD. Aortic Atherosclerosis (ICD10-I70.0). Electronically Signed   By: Elon Alas M.D.   On: 01/31/2018 23:31   Ct Chest W Contrast  Result Date: 01/31/2018 CLINICAL DATA:  Trauma EXAM: CT CHEST, ABDOMEN, AND PELVIS WITH CONTRAST TECHNIQUE: Multidetector CT imaging of the chest, abdomen and pelvis was performed following the standard protocol during bolus administration of intravenous contrast. CONTRAST:  165mL ISOVUE-370 IOPAMIDOL (ISOVUE-370) INJECTION 76%  COMPARISON:  Chest x-ray 01/31/2018 FINDINGS: CT CHEST FINDINGS Cardiovascular: Nonaneurysmal aorta. Mild aortic atherosclerosis. Coronary vascular calcification. Normal heart size. No pericardial effusion. Mediastinum/Nodes: No enlarged mediastinal, hilar, or axillary lymph nodes. Thyroid gland, trachea, and esophagus demonstrate no significant findings. Lungs/Pleura: Mild blebs at the lung bases. No acute consolidation, pleural effusion or pneumothorax. Musculoskeletal: Possible acute to subacute compression fracture T6 with about 25% loss of height of the anterior vertebral body. Sternum is intact. CT ABDOMEN PELVIS FINDINGS Hepatobiliary: Cyst in the left hepatic lobe. No calcified gallstone or biliary dilatation Pancreas: Unremarkable. No pancreatic ductal dilatation or surrounding inflammatory changes. Spleen: No splenic injury or perisplenic hematoma. Adrenals/Urinary Tract: Adrenal glands are within normal limits. No hydronephrosis. 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 Prominent right extrarenal pelvis. Dilated urinary bladder Stomach/Bowel: Stomach is within normal limits. No evidence of bowel wall thickening, distention, or inflammatory changes. Vascular/Lymphatic: Nonaneurysmal aorta. Moderate aortic atherosclerosis. No significantly enlarged lymph nodes. Reproductive: Uterus and bilateral adnexa are unremarkable. Other: Negative for free air or free fluid. Musculoskeletal: No acute or significant osseous findings. Degenerative changes IMPRESSION: 1. No CT evidence for acute intrathoracic, intra-abdominal or intrapelvic abnormality. 2. Possible acute to subacute compression fracture T6 with about 25% loss of height of the anterior vertebral body 3. Ectopic right pelvic kidney Electronically Signed   By: Donavan Foil M.D.   On: 01/31/2018 23:28   Mr Thoracic Spine Wo Contrast  Result Date: 02/01/2018 CLINICAL DATA:  Fall with T6 fracture demonstrated by CT. EXAM: MRI THORACIC SPINE WITHOUT  CONTRAST TECHNIQUE: Multiplanar, multisequence MR imaging of the thoracic spine was performed. No intravenous contrast was administered. COMPARISON:  CT 01/31/2018 FINDINGS: Alignment: Increased kyphosis at the T6 level because of the fracture described below. Vertebrae: Acute or subacute compression  fracture at T6 primarily at the inferior endplate. Loss of height of 40%. Posterior bowing of the posterior inferior margin of the vertebral body by 2 mm. This narrows the ventral subarachnoid space but does not compress the cord. No epidural hematoma. This looks like a benign fracture. No other regional recent fracture. Old minimal superior endplate depression at T2. Cord:  No cord compression or primary cord lesion. Paraspinal and other soft tissues: Negative Disc levels: No significant degenerative disc disease. No disc herniation. No compressive stenosis of the canal or foramina. There are degenerative appearing endplate changes at the L2-3 level, not studied in detail. IMPRESSION: Acute or subacute fracture at T6 with loss of height of 40%. Minimal posterior bowing of the posterior inferior margin of the vertebral body, by only 2 mm. This does not cause significant canal narrowing. This looks like a benign fracture. Electronically Signed   By: Nelson Chimes M.D.   On: 02/01/2018 13:09   Ct Abdomen Pelvis W Contrast  Result Date: 01/31/2018 CLINICAL DATA:  Trauma EXAM: CT CHEST, ABDOMEN, AND PELVIS WITH CONTRAST TECHNIQUE: Multidetector CT imaging of the chest, abdomen and pelvis was performed following the standard protocol during bolus administration of intravenous contrast. CONTRAST:  139mL ISOVUE-370 IOPAMIDOL (ISOVUE-370) INJECTION 76% COMPARISON:  Chest x-ray 01/31/2018 FINDINGS: CT CHEST FINDINGS Cardiovascular: Nonaneurysmal aorta. Mild aortic atherosclerosis. Coronary vascular calcification. Normal heart size. No pericardial effusion. Mediastinum/Nodes: No enlarged mediastinal, hilar, or axillary lymph  nodes. Thyroid gland, trachea, and esophagus demonstrate no significant findings. Lungs/Pleura: Mild blebs at the lung bases. No acute consolidation, pleural effusion or pneumothorax. Musculoskeletal: Possible acute to subacute compression fracture T6 with about 25% loss of height of the anterior vertebral body. Sternum is intact. CT ABDOMEN PELVIS FINDINGS Hepatobiliary: Cyst in the left hepatic lobe. No calcified gallstone or biliary dilatation Pancreas: Unremarkable. No pancreatic ductal dilatation or surrounding inflammatory changes. Spleen: No splenic injury or perisplenic hematoma. Adrenals/Urinary Tract: Adrenal glands are within normal limits. No hydronephrosis. 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 Prominent right extrarenal pelvis. Dilated urinary bladder Stomach/Bowel: Stomach is within normal limits. No evidence of bowel wall thickening, distention, or inflammatory changes. Vascular/Lymphatic: Nonaneurysmal aorta. Moderate aortic atherosclerosis. No significantly enlarged lymph nodes. Reproductive: Uterus and bilateral adnexa are unremarkable. Other: Negative for free air or free fluid. Musculoskeletal: No acute or significant osseous findings. Degenerative changes IMPRESSION: 1. No CT evidence for acute intrathoracic, intra-abdominal or intrapelvic abnormality. 2. Possible acute to subacute compression fracture T6 with about 25% loss of height of the anterior vertebral body 3. Ectopic right pelvic kidney Electronically Signed   By: Donavan Foil M.D.   On: 01/31/2018 23:28   Ct C-spine No Charge  Result Date: 01/31/2018 CLINICAL DATA:  Trip and fall on to table. History of pituitary adenoma. EXAM: CT HEAD WITHOUT CONTRAST CT CERVICAL SPINE WITHOUT CONTRAST TECHNIQUE: Multidetector CT imaging of the head and cervical spine was performed following the standard protocol without intravenous contrast. Multiplanar CT image reconstructions of the cervical spine were also generated. COMPARISON:   MRI head January 04, 2018 FINDINGS: CT HEAD FINDINGS BRAIN: No intraparenchymal hemorrhage, mass effect nor midline shift. The ventricles and sulci are normal for age. Old basal ganglia lacunar infarcts. Patchy supratentorial white matter hypodensities less than expected for patient's age, though non-specific are most compatible with chronic small vessel ischemic disease. No acute large vascular territory infarcts. No abnormal extra-axial fluid collections. Basal cisterns are patent. VASCULAR: Mild calcific atherosclerosis of the carotid siphons. SKULL: No skull fracture.  Osteopenia. No significant scalp soft tissue swelling. SINUSES/ORBITS: Severe chronic RIGHT maxillary sinusitis with bony remodeling. Mastoid air cells are well aerated.The included ocular globes and orbital contents are non-suspicious. Status post bilateral ocular lens implants. OTHER: None. CT CERVICAL SPINE FINDINGS ALIGNMENT: Straightened lordosis.  Vertebral bodies in alignment. SKULL BASE AND VERTEBRAE: Cervical vertebral bodies and posterior elements are intact. Severe C3-4 disc height loss with endplate spurring compatible with degenerative disc. No destructive bony lesions. C1-2 articulation maintained. SOFT TISSUES AND SPINAL CANAL: Nonacute. DISC LEVELS: Moderate canal stenosis C3-4. Severe bilateral C3-4 and LEFT C6-7 neural foraminal narrowing. UPPER CHEST: Lung apices are clear. OTHER: None. IMPRESSION: CT HEAD: 1. No acute intracranial process. 2. Mild chronic small vessel ischemic changes and old basal ganglia lacunar infarcts. CT cervical spine: 1. No fracture or malalignment. 2. Moderate canal stenosis C3-4. Severe bilateral C3-4 and LEFT C6-7 neural foraminal narrowing. Electronically Signed   By: Elon Alas M.D.   On: 01/31/2018 23:52   Dg Chest Port 1 View  Result Date: 01/31/2018 CLINICAL DATA:  Chest pain after trip and fall striking table. EXAM: PORTABLE CHEST 1 VIEW COMPARISON:  08/16/2010 FINDINGS: Lower lung  volumes from prior exam lead to bronchovascular crowding. Mild cardiomegaly is similar allowing for differences in technique, likely accentuated by low lung volumes. Unchanged mediastinal contours. No pneumothorax or focal airspace disease. No large pleural effusion. The bones are under mineralized. No acute osseous abnormalities are seen. IMPRESSION: Low lung volumes without acute abnormality. Electronically Signed   By: Keith Rake M.D.   On: 01/31/2018 22:23   Dg Knee Complete 4 Views Left  Result Date: 01/31/2018 CLINICAL DATA:  Trip and fall into table with bilateral knee pain. EXAM: LEFT KNEE - COMPLETE 4+ VIEW COMPARISON:  None. FINDINGS: No fracture or dislocation. No significant joint effusion. There is prepatellar soft tissue edema. Chondrocalcinosis of the medial and lateral tibiofemoral compartments with mild tricompartmental joint space narrowing and peripheral spurring. The bones are under mineralized. IMPRESSION: 1. Prepatellar soft tissue edema. No fracture or dislocation. 2. Chondrocalcinosis and mild tricompartmental osteoarthritis. Electronically Signed   By: Keith Rake M.D.   On: 01/31/2018 22:26   Dg Knee Complete 4 Views Right  Result Date: 01/31/2018 CLINICAL DATA:  Trip and fall into table with bilateral knee pain. EXAM: RIGHT KNEE - COMPLETE 4+ VIEW COMPARISON:  None. FINDINGS: No fracture or dislocation. No significant joint effusion. There is prepatellar soft tissue edema. Medial and lateral tibiofemoral chondrocalcinosis. Medial joint space narrowing with peripheral spurring. Small quadriceps tendon enthesophyte. IMPRESSION: 1. Prepatellar soft tissue edema.  No fracture or dislocation. 2. Mild osteoarthritis and chondrocalcinosis. Electronically Signed   By: Keith Rake M.D.   On: 01/31/2018 22:27     Assessment/Plan: Diagnosis: Fall with T6 compression fracture and multiple contusions 1. Does the need for close, 24 hr/day medical supervision in concert with  the patient's rehab needs make it unreasonable for this patient to be served in a less intensive setting? Yes 2. Co-Morbidities requiring supervision/potential complications: Osteoporosis, systolic CHF 3. Due to bladder management, bowel management, safety, skin/wound care, disease management, medication administration, pain management and patient education, does the patient require 24 hr/day rehab nursing? Yes 4. Does the patient require coordinated care of a physician, rehab nurse, PT (1-2 hrs/day, 5 days/week) and OT (1-2 hrs/day, 5 days/week) to address physical and functional deficits in the context of the above medical diagnosis(es)? Yes Addressing deficits in the following areas: balance, endurance, locomotion, strength, transferring, bowel/bladder control, bathing,  dressing, feeding, grooming, toileting and psychosocial support 5. Can the patient actively participate in an intensive therapy program of at least 3 hrs of therapy per day at least 5 days per week? No 6. The potential for patient to make measurable gains while on inpatient rehab is Currently poor 7. Anticipated functional outcomes upon discharge from inpatient rehab are supervision  with PT, min assist with OT, n/a with SLP. 8. Estimated rehab length of stay to reach the above functional goals is: Deferred ending further evaluation 9. Anticipated D/C setting: Home 10. Anticipated post D/C treatments: [place] therapy 11. Overall Rehab/Functional Prognosis: good  RECOMMENDATIONS: This patient's condition is appropriate for continued rehabilitative care in the following setting: Currently would only tolerate SNF level, if pain improves and patient able to tolerate only p.o. medications with less nausea then CIR Patient has agreed to participate in recommended program. Patient not sure what to do at this point. Note that insurance prior authorization may be required for reimbursement for recommended care.  Comment: Rehab admission  coordinator to follow progress. "I have personally performed a face to face diagnostic evaluation of this patient.  Additionally, I have reviewed and concur with the physician assistant's documentation above."  Charlett Blake M.D. [facility] FAAPM&R (Sports Med, Neuromuscular Med) Diplomate Am Board of Electrodiagnostic Med  Cathlyn Parsons, PA-C 02/02/2018

## 2018-02-02 NOTE — Progress Notes (Signed)
PT Cancellation Note  Patient Details Name: Susan [name] MRN: [id number] DOB: Aug 24, 1933   Cancelled Treatment:    Reason Eval/Treat Not Completed: Medical issues which prohibited therapy. Discussed pt case with RN who states pt to go tomorrow for kyphoplasty. Will hold further PT treatment until after pt is post-op. Will continue to follow.    Thelma Comp 02/02/2018, 10:25 AM   Rolinda Roan, PT, DPT Acute Rehabilitation Services Pager: [phone] Office: [phone]

## 2018-02-02 NOTE — Plan of Care (Signed)
  Problem: Education: Goal: Knowledge of General Education information will improve Description: Including pain rating scale, medication(s)/side effects and non-pharmacologic comfort measures Outcome: Progressing   Problem: Pain Managment: Goal: General experience of comfort will improve Outcome: Progressing   

## 2018-02-02 NOTE — Care Management Obs Status (Signed)
[place] NOTIFICATION   Patient Details  Name: JONELLA REDDITT MRN: [id number] Date of Birth: [birth year]   Medicare Observation Status Notification Given:  Yes    Ninfa Meeker, RN 02/02/2018, 2:14 PM

## 2018-02-02 NOTE — Care Management Obs Status (Deleted)
[place] NOTIFICATION   Patient Details  Name: Susan [name] MRN: [id number] Date of Birth: July 06, 1933   Medicare Observation Status Notification Given:       Ninfa Meeker, RN 02/02/2018, 2:13 PM

## 2018-02-03 DIAGNOSIS — I447 Left bundle-branch block, unspecified: Secondary | ICD-10-CM | POA: Diagnosis present

## 2018-02-03 DIAGNOSIS — R279 Unspecified lack of coordination: Secondary | ICD-10-CM | POA: Diagnosis not present

## 2018-02-03 DIAGNOSIS — E876 Hypokalemia: Secondary | ICD-10-CM | POA: Diagnosis not present

## 2018-02-03 DIAGNOSIS — G43901 Migraine, unspecified, not intractable, with status migrainosus: Secondary | ICD-10-CM | POA: Diagnosis not present

## 2018-02-03 DIAGNOSIS — S22050K Wedge compression fracture of T5-T6 vertebra, subsequent encounter for fracture with nonunion: Secondary | ICD-10-CM | POA: Diagnosis not present

## 2018-02-03 DIAGNOSIS — S22050A Wedge compression fracture of T5-T6 vertebra, initial encounter for closed fracture: Secondary | ICD-10-CM | POA: Diagnosis not present

## 2018-02-03 DIAGNOSIS — Z79899 Other long term (current) drug therapy: Secondary | ICD-10-CM | POA: Diagnosis not present

## 2018-02-03 DIAGNOSIS — Z743 Need for continuous supervision: Secondary | ICD-10-CM | POA: Diagnosis not present

## 2018-02-03 DIAGNOSIS — W01190A Fall on same level from slipping, tripping and stumbling with subsequent striking against furniture, initial encounter: Secondary | ICD-10-CM | POA: Diagnosis present

## 2018-02-03 DIAGNOSIS — M810 Age-related osteoporosis without current pathological fracture: Secondary | ICD-10-CM | POA: Diagnosis present

## 2018-02-03 DIAGNOSIS — M4854XA Collapsed vertebra, not elsewhere classified, thoracic region, initial encounter for fracture: Secondary | ICD-10-CM | POA: Diagnosis not present

## 2018-02-03 DIAGNOSIS — R079 Chest pain, unspecified: Secondary | ICD-10-CM | POA: Diagnosis present

## 2018-02-03 DIAGNOSIS — G43909 Migraine, unspecified, not intractable, without status migrainosus: Secondary | ICD-10-CM | POA: Diagnosis not present

## 2018-02-03 DIAGNOSIS — Z7902 Long term (current) use of antithrombotics/antiplatelets: Secondary | ICD-10-CM | POA: Diagnosis not present

## 2018-02-03 DIAGNOSIS — I429 Cardiomyopathy, unspecified: Secondary | ICD-10-CM | POA: Diagnosis not present

## 2018-02-03 DIAGNOSIS — E039 Hypothyroidism, unspecified: Secondary | ICD-10-CM | POA: Diagnosis not present

## 2018-02-03 DIAGNOSIS — W19XXXA Unspecified fall, initial encounter: Secondary | ICD-10-CM | POA: Diagnosis not present

## 2018-02-03 DIAGNOSIS — Z955 Presence of coronary angioplasty implant and graft: Secondary | ICD-10-CM | POA: Diagnosis not present

## 2018-02-03 DIAGNOSIS — Y9301 Activity, walking, marching and hiking: Secondary | ICD-10-CM | POA: Diagnosis present

## 2018-02-03 DIAGNOSIS — I2510 Atherosclerotic heart disease of native coronary artery without angina pectoris: Secondary | ICD-10-CM | POA: Diagnosis not present

## 2018-02-03 DIAGNOSIS — E785 Hyperlipidemia, unspecified: Secondary | ICD-10-CM | POA: Diagnosis present

## 2018-02-03 DIAGNOSIS — R0781 Pleurodynia: Secondary | ICD-10-CM | POA: Diagnosis not present

## 2018-02-03 DIAGNOSIS — I5022 Chronic systolic (congestive) heart failure: Secondary | ICD-10-CM | POA: Diagnosis not present

## 2018-02-03 DIAGNOSIS — I110 Hypertensive heart disease with heart failure: Secondary | ICD-10-CM | POA: Diagnosis present

## 2018-02-03 DIAGNOSIS — S299XXA Unspecified injury of thorax, initial encounter: Secondary | ICD-10-CM | POA: Diagnosis not present

## 2018-02-03 NOTE — Progress Notes (Signed)
Occupational Therapy Treatment Patient Details Name: Susan [name] MRN: [id number] DOB: 1933-12-01 Today's Date: 02/03/2018    History of present illness Pt is an 84 y/o female who presents s/p fall at home. Pt hit her R knee and top of her chin on the cofee table and sustained a T6 fracture. C-spine cleared. PMH significant for osteoporosis, LAD stenosis, hypothyroidism, CAD, right/left heart cath and coronary angiography 10/08/2017, lumbar laminectomy, fractured wrist repair.    OT comments  Pt with dramatic improvement in progress towards OT goals. Pt eager to work with therapies. Re-educated on back precautions and brace education. Pt able to don brace with max A. Initially pt min A +2 for transfers, able to progress to min A with RW (previously did not use DME) Educated on compensatory strategies for grooming tasks at sink. Pt will continue to benefit from skilled OT in the acute setting as well as afterwards at CIR level. Pt has stamina to participate in intense therapy and ability to progress to PLOF at mod I.   Of note: Pt complaining of right under breast - "rib pain" that is unbearable.    Follow Up Recommendations  CIR;Supervision/Assistance - 24 hour    Equipment Recommendations  Other (comment)(defer to next venue)    Recommendations for Other Services Rehab consult;PT consult    Precautions / Restrictions Precautions Precautions: Fall;Back Precaution Booklet Issued: Yes (comment) Precaution Comments: Pt was educated on precautions and cued for maintenance of precautions during functional mobility.  Required Braces or Orthoses: Spinal Brace Spinal Brace: Thoracolumbosacral orthotic;Applied in sitting position(Ok to remove for showering) Restrictions Weight Bearing Restrictions: No       Mobility Bed Mobility Overal bed mobility: Needs Assistance Bed Mobility: Rolling;Sidelying to Sit;Sit to Sidelying Rolling: Min guard Sidelying to sit: Min assist;+2 for  safety/equipment     Sit to sidelying: Min assist;+2 for safety/equipment General bed mobility comments: VC's for log roll technique. Physical assist required for proper sequencing.   Transfers Overall transfer level: Needs assistance Equipment used: Rolling walker (2 wheeled) Transfers: Sit to/from Stand Sit to Stand: Min assist;+2 safety/equipment         General transfer comment: VC's for hand placement on seated surface for safety. Increased time required for power-up to full stand however min assist provided for balance support and safety.     Balance Overall balance assessment: Needs assistance Sitting-balance support: Feet supported;No upper extremity supported Sitting balance-Leahy Scale: Fair Sitting balance - Comments: posterior lean but was able to sit unsupported EOB Postural control: Posterior lean Standing balance support: Bilateral upper extremity supported Standing balance-Leahy Scale: Poor Standing balance comment: Reliance on BUE support at this time.                            ADL either performed or assessed with clinical judgement   ADL Overall ADL's : Needs assistance/impaired     Grooming: Min guard;Standing;Wash/dry face;Wash/dry hands;Oral care Grooming Details (indicate cue type and reason): educated in compensatory strategies for ADL at sink         Upper Body Dressing : Maximal assistance;Sitting Upper Body Dressing Details (indicate cue type and reason): Max A to don brace while seated at EOB - full education provide in don/doff brace Lower Body Dressing: Total assistance   Toilet Transfer: Minimal assistance;+2 for physical assistance;+2 for safety/equipment Toilet Transfer Details (indicate cue type and reason): min A +2 for safety - able to progress to min guard assist  Vision       Perception     Praxis      Cognition Arousal/Alertness: Awake/alert Behavior During Therapy: WFL for tasks  assessed/performed Overall Cognitive Status: Within Functional Limits for tasks assessed                                          Exercises     Shoulder Instructions       General Comments Increased complaints of pain where brace was rubbing ribs under bustline. No visible skin irritation however pt reports it feeling "raw" and holding her hand under the right breast due to pain. Question if origin is related to ribs?    Pertinent Vitals/ Pain       Pain Assessment: Faces Faces Pain Scale: Hurts whole lot Pain Location: Back, anteriolateral rib pain bilaterally, however R appeared much worse than left Pain Descriptors / Indicators: Discomfort;Grimacing;Other (Comment)("Raw") Pain Intervention(s): Monitored during session;Repositioned;Patient requesting pain meds-RN notified  Home Living                                          Prior Functioning/Environment              Frequency  Min 2X/week        Progress Toward Goals  OT Goals(current goals can now be found in the care plan section)  Progress towards OT goals: Progressing toward goals  Acute Rehab OT Goals Patient Stated Goal: Return home and back to visiting shut ins OT Goal Formulation: With patient Time For Goal Achievement: 02/15/18 Potential to Achieve Goals: Good  Plan Discharge plan remains appropriate;Frequency remains appropriate    Co-evaluation    PT/OT/SLP Co-Evaluation/Treatment: Yes Reason for Co-Treatment: Complexity of the patient's impairments (multi-system involvement);To address functional/ADL transfers PT goals addressed during session: Mobility/safety with mobility;Balance;Proper use of DME;Strengthening/ROM OT goals addressed during session: ADL's and self-care      AM-PAC PT "6 Clicks" Daily Activity     Outcome Measure   Help from another person eating meals?: None Help from another person taking care of personal grooming?: A Little Help from  another person toileting, which includes using toliet, bedpan, or urinal?: A Little Help from another person bathing (including washing, rinsing, drying)?: A Lot Help from another person to put on and taking off regular upper body clothing?: A Lot Help from another person to put on and taking off regular lower body clothing?: A Lot 6 Click Score: 16    End of Session Equipment Utilized During Treatment: Gait belt;Rolling walker;Back brace  OT Visit Diagnosis: Unsteadiness on feet (R26.81);Other abnormalities of gait and mobility (R26.89);Muscle weakness (generalized) (M62.81);Other symptoms and signs involving cognitive function;Pain Pain - Right/Left: Right Pain - part of body: (back, right ribs under breast)   Activity Tolerance Patient tolerated treatment well   Patient Left in bed;with call bell/phone within reach;with bed alarm set;with family/visitor present   Nurse Communication Mobility status;Precautions;Patient requests pain meds(pain in ribs?)        Time: 7026-3785 OT Time Calculation (min): 53 min  Charges: OT General Charges $OT Visit: 1 Visit OT Treatments $Self Care/Home Management : 23-37 mins Hulda Humphrey OTR/L Acute Rehabilitation Services Pager: [phone] Office: [place] 02/03/2018, 5:12 PM

## 2018-02-03 NOTE — Progress Notes (Signed)
PROGRESS NOTE    Susan [name]  WLS:[id number] DOB: May 11, 1933 DOA: 01/31/2018 PCP: Crist Infante, MD    Brief Narrative:  84 year old female who presented after a mechanical fall.  She does have the significant past medical history for coronary artery disease, chronic diastolic heart failure, carotid vascular disease, dyslipidemia and hypothyroidism.  Patient sustained a mechanical fall from her own height, trauma to her chest and back, no head trauma loss of consciousness.  On her initial physical examination blood pressure 145/69, heart rate 72, temperature 98.2, respiratory rate 18, oxygen saturation 98%.  Her lungs were clear to auscultation bilaterally, heart S1-S2 present rhythmic, abdomen soft nontender, no lower extremity edema.  CT of the abdomen showed T6 subacute compression fracture with about 25% loss of height of the anterior vertebral body.   Patient was admitted to the hospital with working diagnosis of T6 subacute compression fracture.  02/02/2018: Patient reports severe migrainous symptoms.  Will treat patient's migraine acutely.  02/03/2018: Migraine has resolved.  Patient looks a lot better.  Kyphoplasty is planned for Monday, 02/07/2018.  Assessment & Plan:   Active Problems:   Arteriosclerosis of coronary artery   Hypothyroidism   Hyperlipidemia   Cardiomyopathy ([place])   Hypokalemia   Closed wedge compression fracture of T6 vertebra (HCC)   Chest pain   Coronary artery disease   Closed wedge compression fracture of sixth thoracic vertebra (HCC)   1.  T6 acute/ subacute compression fracture. Patient with significant pain, worse with movement, will continue pain control with hydromorphone IV, methocarbamol, and tramadol. Follow on MRI result and IR recommendations, possible hypoplasty. Physical therapy evaluation, recommendation for CIR.  02/03/2018: For possible kyphoplasty/vertebroplasty on Monday, 02/07/2018.   2. Dyslipidemia. Continue rosuvastatin.   3.  Hypothyroid. On levothyroxine.   4. Hypertension. Will continue blood pressure control with carvedilol.  5. Hypokalemia.  Resolved.    6.  Acute migraine: Resolved.  Further management depend on hospital course.   DVT prophylaxis: scd   Code Status: full Family Communication: I spoke with patient's family at the bedside and all questions were addressed.  Disposition Plan/ discharge barriers: pending MRI   Consultants:   IR  Antimicrobials:   None   Subjective: No complaints.   Migraine headache has resolved.    Objective: Vitals:   02/02/18 2130 02/03/18 0520 02/03/18 1334 02/03/18 1710  BP: 116/62 130/64 (!) 117/56 (!) 117/57  Pulse: 66 63 64 74  Resp: 18 17 18    Temp: 98.3 F (36.8 C) 97.7 F (36.5 C) 98 F (36.7 C)   TempSrc: Oral Oral Oral   SpO2: 94% 97% 97%   Weight:      Height:        Intake/Output Summary (Last 24 hours) at 02/03/2018 1919 Last data filed at 02/03/2018 1700 Gross per 24 hour  Intake 720 ml  Output 1450 ml  Net -730 ml   Filed Weights   01/31/18 2110  Weight: 57.6 kg    Examination:   General: In mild distress secondary to the migraine attack.   Neurology: Awake and alert, non focal  HEENT: mild pallor, no icterus, oral mucosa moist Cardiovascular: S1-S2 Pulmonary: Clear to auscultation.   Gastrointestinal. Abdomen with no organomegaly, non tender, no rebound or guarding   Data Reviewed: I have personally reviewed following labs and imaging studies  CBC: Recent Labs  Lab 01/31/18 2145 01/31/18 2152 02/01/18 0245  WBC 7.6  --  9.7  NEUTROABS 4.8  --   --  HGB 12.2 12.6 13.4  HCT 37.0 37.0 40.8  MCV 98.7  --  97.6  PLT 183  --  202   Basic Metabolic Panel: Recent Labs  Lab 01/31/18 2145 01/31/18 2152 02/01/18 0245 02/02/18 0630  NA 137 140 135 134*  K 3.1* 3.1* 3.6 4.7  CL 109 107 102 102  CO2 20*  --  23 26  GLUCOSE 101* 93 128* 92  BUN 15 16 13 10   CREATININE 0.84 0.70 0.88 0.90  CALCIUM 8.3*  --   9.5 9.2  MG  --   --  2.1  --   PHOS  --   --  4.0  --    GFR: Estimated Creatinine Clearance: 39.9 mL/min (by C-G formula based on SCr of 0.9 mg/dL). Liver Function Tests: Recent Labs  Lab 01/31/18 2145 02/01/18 0245  AST 23 25  ALT 16 19  ALKPHOS 53 59  BILITOT 0.3 0.6  PROT 5.9* 7.1  ALBUMIN 3.4* 3.9   No results for input(s): LIPASE, AMYLASE in the last 168 hours. No results for input(s): AMMONIA in the last 168 hours. Coagulation Profile: No results for input(s): INR, PROTIME in the last 168 hours. Cardiac Enzymes: No results for input(s): CKTOTAL, CKMB, CKMBINDEX, TROPONINI in the last 168 hours. BNP (last 3 results) No results for input(s): PROBNP in the last 8760 hours. HbA1C: No results for input(s): HGBA1C in the last 72 hours. CBG: No results for input(s): GLUCAP in the last 168 hours. Lipid Profile: No results for input(s): CHOL, HDL, LDLCALC, TRIG, CHOLHDL, LDLDIRECT in the last 72 hours. Thyroid Function Tests: Recent Labs    02/01/18 0245  TSH 0.202*   Anemia Panel: No results for input(s): VITAMINB12, FOLATE, FERRITIN, TIBC, IRON, RETICCTPCT in the last 72 hours.    Radiology Studies: I have reviewed all of the imaging during this hospital visit personally     Scheduled Meds: . carvedilol  3.125 mg Oral BID WC  . levothyroxine  75 mcg Oral QAC breakfast  . potassium chloride  40 mEq Oral Once  . rosuvastatin  40 mg Oral Daily  . senna  1 tablet Oral BID  . venlafaxine XR  150 mg Oral Daily   Continuous Infusions:    LOS: 0 days        Bonnell Public, MD [facility] Pager [phone]

## 2018-02-03 NOTE — Progress Notes (Signed)
Physical Therapy Treatment Patient Details Name: Susan [name] MRN: [id number] DOB: [birth year] Today's Date: 02/03/2018    History of Present Illness Pt is an 84 y/o female who presents s/p fall at home. Pt hit her R knee and top of her chin on the cofee table and sustained a T6 fracture. C-spine cleared. PMH significant for osteoporosis, LAD stenosis, hypothyroidism, CAD, right/left heart cath and coronary angiography 10/08/2017, lumbar laminectomy, fractured wrist repair.     PT Comments    Pt progressing towards physical therapy goals. Pt alert throughout session and demonstrated a good rehab effort overall. Continues to be limited by pain, however complaints were mostly in the rib area on the right after she mobilized in the room with brace donned. Pt and family asking about the possibility of fractured ribs causing the pain. RN notified. Very supportive daughter present throughout session. Continue to recommend CIR level rehab at d/c as pt was very independent and active PTA, and feel she would thrive in a higher intensity rehab environment.   Follow Up Recommendations  CIR;Supervision/Assistance - 24 hour     Equipment Recommendations  Rolling walker with 5" wheels    Recommendations for Other Services Rehab consult     Precautions / Restrictions Precautions Precautions: Fall;Back Precaution Booklet Issued: Yes (comment) Precaution Comments: Pt was educated on precautions and cued for maintenance of precautions during functional mobility.  Required Braces or Orthoses: Spinal Brace Spinal Brace: Thoracolumbosacral orthotic;Applied in sitting position(Ok to remove for showering) Restrictions Weight Bearing Restrictions: No    Mobility  Bed Mobility Overal bed mobility: Needs Assistance Bed Mobility: Rolling;Sidelying to Sit;Sit to Sidelying Rolling: Min guard Sidelying to sit: Min assist;+2 for safety/equipment     Sit to sidelying: Min assist;+2 for  safety/equipment General bed mobility comments: VC's for log roll technique. Physical assist required for proper sequencing.   Transfers Overall transfer level: Needs assistance Equipment used: Rolling walker (2 wheeled) Transfers: Sit to/from Stand Sit to Stand: Min assist;+2 safety/equipment         General transfer comment: VC's for hand placement on seated surface for safety. Increased time required for power-up to full stand however min assist provided for balance support and safety.   Ambulation/Gait Ambulation/Gait assistance: Min assist Gait Distance (Feet): 40 Feet Assistive device: Rolling walker (2 wheeled) Gait Pattern/deviations: Decreased stride length;Trunk flexed;Step-through pattern Gait velocity: Decreased Gait velocity interpretation: <1.31 ft/sec, indicative of household ambulator General Gait Details: Slow and guarded. VC's for improved posture throughout gait training. We remained in room only per pt request however feel she could have progressed to the hall. Pt required assist for walker management at times and hands-on guarding throughout.    Stairs             Wheelchair Mobility    Modified Rankin (Stroke Patients Only)       Balance Overall balance assessment: Needs assistance Sitting-balance support: Feet supported;No upper extremity supported Sitting balance-Leahy Scale: Fair Sitting balance - Comments: posterior lean but was able to sit unsupported EOB Postural control: Posterior lean Standing balance support: Bilateral upper extremity supported Standing balance-Leahy Scale: Poor Standing balance comment: Reliance on BUE support at this time.                             Cognition Arousal/Alertness: Awake/alert Behavior During Therapy: WFL for tasks assessed/performed Overall Cognitive Status: Within Functional Limits for tasks assessed  Exercises      General  Comments General comments (skin integrity, edema, etc.): Increased complaints of pain where brace was rubbing ribs under bustline. No visible skin irritation however pt reports it feeling "raw" and holding her hand under the right breast due to pain. Question if origin is related to ribs?      Pertinent Vitals/Pain Pain Assessment: Faces Faces Pain Scale: Hurts whole lot Pain Location: Back, anteriolateral rib pain bilaterally, however R appeared much worse than left Pain Descriptors / Indicators: Discomfort;Grimacing;Other (Comment)("Raw") Pain Intervention(s): Limited activity within patient's tolerance;Monitored during session;Repositioned    Home Living                      Prior Function            PT Goals (current goals can now be found in the care plan section) Acute Rehab PT Goals Patient Stated Goal: Return home and working in the yard PT Goal Formulation: With patient Time For Goal Achievement: 02/15/18 Potential to Achieve Goals: Good Progress towards PT goals: Progressing toward goals    Frequency    Min 4X/week      PT Plan Current plan remains appropriate    Co-evaluation PT/OT/SLP Co-Evaluation/Treatment: Yes Reason for Co-Treatment: Complexity of the patient's impairments (multi-system involvement);To address functional/ADL transfers PT goals addressed during session: Mobility/safety with mobility;Balance;Proper use of DME        AM-PAC PT "6 Clicks" Daily Activity  Outcome Measure  Difficulty turning over in bed (including adjusting bedclothes, sheets and blankets)?: Unable Difficulty moving from lying on back to sitting on the side of the bed? : Unable Difficulty sitting down on and standing up from a chair with arms (e.g., wheelchair, bedside commode, etc,.)?: Unable Help needed moving to and from a bed to chair (including a wheelchair)?: A Little Help needed walking in hospital room?: A Little Help needed climbing 3-5 steps with a  railing? : Total 6 Click Score: 10    End of Session Equipment Utilized During Treatment: Gait belt;Back brace Activity Tolerance: Patient limited by pain Patient left: in bed;with call bell/phone within reach;with family/visitor present(Bed in chair position) Nurse Communication: Mobility status PT Visit Diagnosis: Unsteadiness on feet (R26.81);Pain;Difficulty in walking, not elsewhere classified (R26.2) Pain - Right/Left: Right Pain - part of body: (back, ribs)     Time: 1308-6578 PT Time Calculation (min) (ACUTE ONLY): 52 min  Charges:  $Gait Training: 8-22 mins                     Rolinda Roan, PT, DPT Acute Rehabilitation Services Pager: [phone] Office: [phone]    Thelma Comp 02/03/2018, 3:29 PM

## 2018-02-03 NOTE — Progress Notes (Addendum)
Inpatient Rehabilitation  Noted improvements in performance with PT.  Await updated OT notes will then review and hopefully initiate authorization with [facility] Medicare.  Plan to update team tomorrow.  Call if questions.   Carmelia Roller., CCC/SLP Admission Coordinator  [place]  Cell [phone]

## 2018-02-03 NOTE — Progress Notes (Signed)
Inpatient Rehabilitation  Please see consult by Dr. Letta Pate for full details.  Patient not appropriate for IP Rehab at this time and he currently recommends SNF level of post acute rehab.  If patient remains in house will follow for potential to tolerate program.  Call if questions.   Carmelia Roller., CCC/SLP Admission Coordinator  [place]  Cell [phone]

## 2018-02-04 ENCOUNTER — Inpatient Hospital Stay (HOSPITAL_COMMUNITY): Payer: Medicare Other

## 2018-02-04 DIAGNOSIS — R0781 Pleurodynia: Secondary | ICD-10-CM

## 2018-02-04 DIAGNOSIS — S22050K Wedge compression fracture of T5-T6 vertebra, subsequent encounter for fracture with nonunion: Secondary | ICD-10-CM

## 2018-02-04 LAB — PROTIME-INR
INR: 1
PROTHROMBIN TIME: 13.1 s (ref 11.4–15.2)

## 2018-02-04 MED ORDER — LIDOCAINE 5 % EX PTCH
1.0000 | MEDICATED_PATCH | CUTANEOUS | Status: DC
  Administered 2018-02-04 – 2018-02-11 (×8): 1 via TRANSDERMAL
  Filled 2018-02-04 (×11): qty 1

## 2018-02-04 MED ORDER — OXYCODONE HCL ER 10 MG PO T12A
10.0000 mg | EXTENDED_RELEASE_TABLET | Freq: Two times a day (BID) | ORAL | Status: DC
  Administered 2018-02-04 – 2018-02-11 (×15): 10 mg via ORAL
  Filled 2018-02-04 (×14): qty 1

## 2018-02-04 NOTE — Progress Notes (Signed)
Pt complained of pain 10/10, PRN PO pain meds given, cold compress applied. Pt's family requesting for additional pain meds and palliative care. MD notified. Will continue to monitor.

## 2018-02-04 NOTE — Progress Notes (Signed)
Inpatient Rehabilitation  Note surgical plans for Monday, 02/07/18 will close request for IP Rehab admission as it is not appropriate at this time.  Will plan to follow up after surgery for post acute rehab needs.  Call if questions.   Carmelia Roller., CCC/SLP Admission Coordinator  [place]  Cell [phone]

## 2018-02-04 NOTE — Plan of Care (Signed)

## 2018-02-04 NOTE — Consult Note (Signed)
Chief Complaint: Patient was seen in consultation today for thoracic 6 compression fracture.  Referring Physician(s): Toy Baker  Supervising Physician: Luanne Bras  Patient Status: [facility] - In-pt  History of Present Illness: Susan [name] is a 84 y.o. female with a past medical history of CAD, hypothyroidism, and osteoporosis. She presented to [facility] ED 01/31/2018 with complaint of recent fall. Earlier that day, she was walking at church when she tripped and fell forward, striking her knees, upper chest, head, and neck on a table. She experienced immediate pain across her chest with associated dyspnea. Upon evaluation in ED, she was found to have an acute compression fracture and was admitted for management and pain control.  CT chest/abdomen/pelvis 01/31/2018: 1. No CT evidence for acute intrathoracic, intra-abdominal or intrapelvic abnormality. 2. Possible acute to subacute compression fracture T6 with about 25% loss of height of the anterior vertebral body. 3. Ectopic right pelvic kidney.  MRI thoracic spine 02/01/2018: 1. Acute or subacute fracture at T6 with loss of height of 40%. Minimal posterior bowing of the posterior inferior margin of the vertebral body, by only 2 mm. This does not cause significant canal narrowing. This looks like a benign fracture.  IR requested by Dr. Roel Cluck for management of thoracic 6 compression fracture. Patient awake and alert laying in bed. Complains of back pain, rated 8-9/10. Complains of nausea. States this has been occurring intermittently since hospital admission. Denies fever, chills, chest pain, dyspnea, abdominal pain, bladder/bowel incontinence, urinary symptoms including dysuria or frequency, numbness/tingling down legs, headache, or dizziness.  Patient is currently taking Plavix 75 mg once daily- reports last dose 01/31/2018.    Past Medical History:  Diagnosis Date  . Coronary artery disease    POST PTCA AND STENTING  OF HER RIGHT CORONARY ARTERY  . Hypothyroidism   . LAD stenosis    MODERATE 50-60% STENOSIS  . Osteoporosis     Past Surgical History:  Procedure Laterality Date  . APPENDECTOMY  1970s?   "ruptured"   . BACK SURGERY    . CATARACT EXTRACTION W/ INTRAOCULAR LENS  IMPLANT, BILATERAL Bilateral   . CORONARY ANGIOPLASTY WITH STENT PLACEMENT  ~ 2009   "2 stents"  . FRACTURE SURGERY    . KNEE ARTHROSCOPY Right     torn meniscus  . LUMBAR LAMINECTOMY  1978  . OVARIAN CYST SURGERY  1970s?   "ruptured"   . RIGHT/LEFT HEART CATH AND CORONARY ANGIOGRAPHY N/A 10/08/2017   Procedure: RIGHT/LEFT HEART CATH AND CORONARY ANGIOGRAPHY;  Surgeon: Nelva Bush, MD;  Location: [place] [facility];  Service: Cardiovascular;  Laterality: N/A;  . WRIST FRACTURE SURGERY Bilateral    "2 surgeries for 2 breaks on left side; 1 surgery for 1 break on right wrist"    Allergies: Patient has no known allergies.  Medications: Prior to Admission medications   Medication Sig Start Date End Date Taking? Authorizing Provider  Calcium Carbonate-Vitamin D (CALCIUM + D PO) Take 1 tablet by mouth at bedtime.    Yes [provider]  clopidogrel (PLAVIX) 75 MG tablet TAKE 1 TABLET BY MOUTH ONCE DAILY 10/04/17  Yes Nahser, Wonda Cheng, MD  levothyroxine (SYNTHROID, LEVOTHROID) 75 MCG tablet Take 1 tablet by mouth daily. 11/30/17  Yes [provider]  multivitamin-lutein (OCUVITE-LUTEIN) CAPS capsule Take 1 capsule daily by mouth.   Yes [provider]  nitroGLYCERIN (NITROSTAT) 0.4 MG SL tablet Place 1 tablet (0.4 mg total) under the tongue every 5 (five) minutes as needed. 06/22/11  Yes  Nahser, Wonda Cheng, MD  Omega-3 Fatty Acids (FISH OIL) 1200 MG CAPS Take 1,200 mg by mouth at bedtime.   Yes [provider]  rosuvastatin (CRESTOR) 40 MG tablet TAKE 1 TABLET BY MOUTH ONCE DAILY 01/05/18  Yes Nahser, Wonda Cheng, MD  venlafaxine XR (EFFEXOR-XR) 150 MG 24 hr capsule TAKE 1 CAPSULE BY MOUTH ONCE  DAILY 06/22/17  Yes Nahser, Wonda Cheng, MD  vitamin B-12 (CYANOCOBALAMIN) 1000 MCG tablet Take 2 tablets by mouth daily.   Yes [provider]     Family History  Problem Relation Age of Onset  . Alzheimer's disease Mother   . Heart attack Father   . Aneurysm Sister     Social History   Socioeconomic History  . Marital status: Widowed    Spouse name: Not on file  . Number of children: Not on file  . Years of education: Not on file  . Highest education level: Not on file  Occupational History  . Not on file  Social Needs  . Financial resource strain: Not on file  . Food insecurity:    Worry: Not on file    Inability: Not on file  . Transportation needs:    Medical: Not on file    Non-medical: Not on file  Tobacco Use  . Smoking status: Never Smoker  . Smokeless tobacco: Never Used  Substance and Sexual Activity  . Alcohol use: Yes    Alcohol/week: 1.0 standard drinks    Types: 1 Glasses of wine per week  . Drug use: Never  . Sexual activity: Not on file  Lifestyle  . Physical activity:    Days per week: Not on file    Minutes per session: Not on file  . Stress: Not on file  Relationships  . Social connections:    Talks on phone: Not on file    Gets together: Not on file    Attends religious service: Not on file    Active member of club or organization: Not on file    Attends meetings of clubs or organizations: Not on file    Relationship status: Not on file  Other Topics Concern  . Not on file  Social History Narrative  . Not on file     Review of Systems: A 12 point ROS discussed and pertinent positives are indicated in the HPI above.  All other systems are negative.  Review of Systems  Constitutional: Negative for chills and fever.  Respiratory: Negative for shortness of breath and wheezing.   Cardiovascular: Negative for chest pain and palpitations.  Gastrointestinal: Positive for nausea. Negative for abdominal pain.       Negative for bowel  incontinence.  Genitourinary: Negative for dysuria and frequency.       Negative for bladder incontinence.  Musculoskeletal: Positive for back pain.  Neurological: Negative for dizziness, numbness and headaches.  Psychiatric/Behavioral: Negative for behavioral problems and confusion.    Vital Signs: BP 131/66 (BP Location: Right Arm)   Pulse 60   Temp 98.7 F (37.1 C) (Oral)   Resp (!) 5   Ht 5\' 3"  (1.6 m)   Wt 127 lb (57.6 kg)   SpO2 97%   BMI 22.50 kg/m   Physical Exam  Constitutional: She is oriented to person, place, and time. She appears well-developed and well-nourished. No distress.  Cardiovascular: Normal rate, regular rhythm and normal heart sounds.  No murmur heard. Pulmonary/Chest: Effort normal and breath sounds normal. No respiratory distress. She has no wheezes.  Musculoskeletal:  Mild-moderate midline back pain at approximate level of thoracic 6.  Neurological: She is alert and oriented to person, place, and time.  Skin: Skin is warm and dry.  Psychiatric: She has a normal mood and affect. Her behavior is normal. Thought content normal.  Nursing note and vitals reviewed.    MD Evaluation Airway: WNL Heart: WNL Abdomen: WNL Chest/ Lungs: WNL ASA  Classification: 2 Mallampati/Airway Score: Two   Imaging: Ct Head Wo Contrast  Result Date: 01/31/2018 CLINICAL DATA:  Trip and fall on to table. History of pituitary adenoma. EXAM: CT HEAD WITHOUT CONTRAST CT CERVICAL SPINE WITHOUT CONTRAST TECHNIQUE: Multidetector CT imaging of the head and cervical spine was performed following the standard protocol without intravenous contrast. Multiplanar CT image reconstructions of the cervical spine were also generated. COMPARISON:  MRI head January 04, 2018 FINDINGS: CT HEAD FINDINGS BRAIN: No intraparenchymal hemorrhage, mass effect nor midline shift. The ventricles and sulci are normal for age. Old basal ganglia lacunar infarcts. Patchy supratentorial white matter  hypodensities less than expected for patient's age, though non-specific are most compatible with chronic small vessel ischemic disease. No acute large vascular territory infarcts. No abnormal extra-axial fluid collections. Basal cisterns are patent. VASCULAR: Mild calcific atherosclerosis of the carotid siphons. SKULL: No skull fracture. Osteopenia. No significant scalp soft tissue swelling. SINUSES/ORBITS: Severe chronic RIGHT maxillary sinusitis with bony remodeling. Mastoid air cells are well aerated.The included ocular globes and orbital contents are non-suspicious. Status post bilateral ocular lens implants. OTHER: None. CT CERVICAL SPINE FINDINGS ALIGNMENT: Straightened lordosis.  Vertebral bodies in alignment. SKULL BASE AND VERTEBRAE: Cervical vertebral bodies and posterior elements are intact. Severe C3-4 disc height loss with endplate spurring compatible with degenerative disc. No destructive bony lesions. C1-2 articulation maintained. SOFT TISSUES AND SPINAL CANAL: Nonacute. DISC LEVELS: Moderate canal stenosis C3-4. Severe bilateral C3-4 and LEFT C6-7 neural foraminal narrowing. UPPER CHEST: Lung apices are clear. OTHER: None. IMPRESSION: CT HEAD: 1. No acute intracranial process. 2. Mild chronic small vessel ischemic changes and old basal ganglia lacunar infarcts. CT cervical spine: 1. No fracture or malalignment. 2. Moderate canal stenosis C3-4. Severe bilateral C3-4 and LEFT C6-7 neural foraminal narrowing. Electronically Signed   By: Elon Alas M.D.   On: 01/31/2018 23:52   Ct Angio Neck W And/or Wo Contrast  Result Date: 01/31/2018 CLINICAL DATA:  Blunted neck trauma.  Trip and fall onto table. EXAM: CT ANGIOGRAPHY NECK TECHNIQUE: Multidetector CT imaging of the neck was performed using the standard protocol during bolus administration of intravenous contrast. Multiplanar CT image reconstructions and MIPs were obtained to evaluate the vascular anatomy. Carotid stenosis measurements (when  applicable) are obtained utilizing NASCET criteria, using the distal internal carotid diameter as the denominator. CONTRAST:  131mL ISOVUE-370 IOPAMIDOL (ISOVUE-370) INJECTION 76% COMPARISON:  None. FINDINGS: AORTIC ARCH: Normal appearance of the thoracic arch, normal branch pattern. Mild calcific atherosclerosis aortic arch. The origins of the innominate, left Common carotid artery and subclavian artery are widely patent; patulous LEFT subclavian artery origin. RIGHT CAROTID SYSTEM: Common carotid artery is patent, tortuous course. Calcific atherosclerosis resulting in less than 50% stenosis by NASCET criteria. Slight linear filling defect. Patent internal carotid artery, focally beaded and tortuous. LEFT CAROTID SYSTEM: Common carotid artery is patent. Severe calcific atherosclerosis distal common carotid artery and LEFT internal carotid artery origin with less than 50% stenosis by NASCET criteria. Patent internal carotid artery, beaded tortuous cervical internal carotid artery. VERTEBRAL ARTERIES:Codominant vertebral arteries. Moderate stenosis LEFT vertebral artery. Luminal irregularity  bilateral vertebral arteries. SKELETON: No acute osseous process though bone windows have not been submitted. OTHER NECK: RIGHT clavicular soft tissue swelling, possible contusion. UPPER CHEST: Please see dedicated CT of chest from same day, reported separately. IMPRESSION: 1. No acute vascular process. 2. Less than 50% stenosis RIGHT ICA origin. Small non flow-limiting RIGHT ICA web or old dissection without pseudoaneurysm. 3. 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 4. Moderate stenosis LEFT vertebral artery origin. Bilateral vertebral artery atherosclerosis or FMD. Aortic Atherosclerosis (ICD10-I70.0). Electronically Signed   By: Elon Alas M.D.   On: 01/31/2018 23:31   Ct Chest W Contrast  Result Date: 01/31/2018 CLINICAL DATA:  Trauma EXAM: CT CHEST, ABDOMEN, AND PELVIS WITH CONTRAST  TECHNIQUE: Multidetector CT imaging of the chest, abdomen and pelvis was performed following the standard protocol during bolus administration of intravenous contrast. CONTRAST:  170mL ISOVUE-370 IOPAMIDOL (ISOVUE-370) INJECTION 76% COMPARISON:  Chest x-ray 01/31/2018 FINDINGS: CT CHEST FINDINGS Cardiovascular: Nonaneurysmal aorta. Mild aortic atherosclerosis. Coronary vascular calcification. Normal heart size. No pericardial effusion. Mediastinum/Nodes: No enlarged mediastinal, hilar, or axillary lymph nodes. Thyroid gland, trachea, and esophagus demonstrate no significant findings. Lungs/Pleura: Mild blebs at the lung bases. No acute consolidation, pleural effusion or pneumothorax. Musculoskeletal: Possible acute to subacute compression fracture T6 with about 25% loss of height of the anterior vertebral body. Sternum is intact. CT ABDOMEN PELVIS FINDINGS Hepatobiliary: Cyst in the left hepatic lobe. No calcified gallstone or biliary dilatation Pancreas: Unremarkable. No pancreatic ductal dilatation or surrounding inflammatory changes. Spleen: No splenic injury or perisplenic hematoma. Adrenals/Urinary Tract: Adrenal glands are within normal limits. No hydronephrosis. 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 Prominent right extrarenal pelvis. Dilated urinary bladder Stomach/Bowel: Stomach is within normal limits. No evidence of bowel wall thickening, distention, or inflammatory changes. Vascular/Lymphatic: Nonaneurysmal aorta. Moderate aortic atherosclerosis. No significantly enlarged lymph nodes. Reproductive: Uterus and bilateral adnexa are unremarkable. Other: Negative for free air or free fluid. Musculoskeletal: No acute or significant osseous findings. Degenerative changes IMPRESSION: 1. No CT evidence for acute intrathoracic, intra-abdominal or intrapelvic abnormality. 2. Possible acute to subacute compression fracture T6 with about 25% loss of height of the anterior vertebral body 3. Ectopic right  pelvic kidney Electronically Signed   By: Donavan Foil M.D.   On: 01/31/2018 23:28   Mr Thoracic Spine Wo Contrast  Result Date: 02/01/2018 CLINICAL DATA:  Fall with T6 fracture demonstrated by CT. EXAM: MRI THORACIC SPINE WITHOUT CONTRAST TECHNIQUE: Multiplanar, multisequence MR imaging of the thoracic spine was performed. No intravenous contrast was administered. COMPARISON:  CT 01/31/2018 FINDINGS: Alignment: Increased kyphosis at the T6 level because of the fracture described below. Vertebrae: Acute or subacute compression fracture at T6 primarily at the inferior endplate. Loss of height of 40%. Posterior bowing of the posterior inferior margin of the vertebral body by 2 mm. This narrows the ventral subarachnoid space but does not compress the cord. No epidural hematoma. This looks like a benign fracture. No other regional recent fracture. Old minimal superior endplate depression at T2. Cord:  No cord compression or primary cord lesion. Paraspinal and other soft tissues: Negative Disc levels: No significant degenerative disc disease. No disc herniation. No compressive stenosis of the canal or foramina. There are degenerative appearing endplate changes at the L2-3 level, not studied in detail. IMPRESSION: Acute or subacute fracture at T6 with loss of height of 40%. Minimal posterior bowing of the posterior inferior margin of the vertebral body, by only 2 mm. This does not cause significant canal narrowing.  This looks like a benign fracture. Electronically Signed   By: Nelson Chimes M.D.   On: 02/01/2018 13:09   Ct Abdomen Pelvis W Contrast  Result Date: 01/31/2018 CLINICAL DATA:  Trauma EXAM: CT CHEST, ABDOMEN, AND PELVIS WITH CONTRAST TECHNIQUE: Multidetector CT imaging of the chest, abdomen and pelvis was performed following the standard protocol during bolus administration of intravenous contrast. CONTRAST:  170mL ISOVUE-370 IOPAMIDOL (ISOVUE-370) INJECTION 76% COMPARISON:  Chest x-ray 01/31/2018  FINDINGS: CT CHEST FINDINGS Cardiovascular: Nonaneurysmal aorta. Mild aortic atherosclerosis. Coronary vascular calcification. Normal heart size. No pericardial effusion. Mediastinum/Nodes: No enlarged mediastinal, hilar, or axillary lymph nodes. Thyroid gland, trachea, and esophagus demonstrate no significant findings. Lungs/Pleura: Mild blebs at the lung bases. No acute consolidation, pleural effusion or pneumothorax. Musculoskeletal: Possible acute to subacute compression fracture T6 with about 25% loss of height of the anterior vertebral body. Sternum is intact. CT ABDOMEN PELVIS FINDINGS Hepatobiliary: Cyst in the left hepatic lobe. No calcified gallstone or biliary dilatation Pancreas: Unremarkable. No pancreatic ductal dilatation or surrounding inflammatory changes. Spleen: No splenic injury or perisplenic hematoma. Adrenals/Urinary Tract: Adrenal glands are within normal limits. No hydronephrosis. 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 Prominent right extrarenal pelvis. Dilated urinary bladder Stomach/Bowel: Stomach is within normal limits. No evidence of bowel wall thickening, distention, or inflammatory changes. Vascular/Lymphatic: Nonaneurysmal aorta. Moderate aortic atherosclerosis. No significantly enlarged lymph nodes. Reproductive: Uterus and bilateral adnexa are unremarkable. Other: Negative for free air or free fluid. Musculoskeletal: No acute or significant osseous findings. Degenerative changes IMPRESSION: 1. No CT evidence for acute intrathoracic, intra-abdominal or intrapelvic abnormality. 2. Possible acute to subacute compression fracture T6 with about 25% loss of height of the anterior vertebral body 3. Ectopic right pelvic kidney Electronically Signed   By: Donavan Foil M.D.   On: 01/31/2018 23:28   Ct C-spine No Charge  Result Date: 01/31/2018 CLINICAL DATA:  Trip and fall on to table. History of pituitary adenoma. EXAM: CT HEAD WITHOUT CONTRAST CT CERVICAL SPINE WITHOUT  CONTRAST TECHNIQUE: Multidetector CT imaging of the head and cervical spine was performed following the standard protocol without intravenous contrast. Multiplanar CT image reconstructions of the cervical spine were also generated. COMPARISON:  MRI head January 04, 2018 FINDINGS: CT HEAD FINDINGS BRAIN: No intraparenchymal hemorrhage, mass effect nor midline shift. The ventricles and sulci are normal for age. Old basal ganglia lacunar infarcts. Patchy supratentorial white matter hypodensities less than expected for patient's age, though non-specific are most compatible with chronic small vessel ischemic disease. No acute large vascular territory infarcts. No abnormal extra-axial fluid collections. Basal cisterns are patent. VASCULAR: Mild calcific atherosclerosis of the carotid siphons. SKULL: No skull fracture. Osteopenia. No significant scalp soft tissue swelling. SINUSES/ORBITS: Severe chronic RIGHT maxillary sinusitis with bony remodeling. Mastoid air cells are well aerated.The included ocular globes and orbital contents are non-suspicious. Status post bilateral ocular lens implants. OTHER: None. CT CERVICAL SPINE FINDINGS ALIGNMENT: Straightened lordosis.  Vertebral bodies in alignment. SKULL BASE AND VERTEBRAE: Cervical vertebral bodies and posterior elements are intact. Severe C3-4 disc height loss with endplate spurring compatible with degenerative disc. No destructive bony lesions. C1-2 articulation maintained. SOFT TISSUES AND SPINAL CANAL: Nonacute. DISC LEVELS: Moderate canal stenosis C3-4. Severe bilateral C3-4 and LEFT C6-7 neural foraminal narrowing. UPPER CHEST: Lung apices are clear. OTHER: None. IMPRESSION: CT HEAD: 1. No acute intracranial process. 2. Mild chronic small vessel ischemic changes and old basal ganglia lacunar infarcts. CT cervical spine: 1. No fracture or malalignment. 2. Moderate canal stenosis  C3-4. Severe bilateral C3-4 and LEFT C6-7 neural foraminal narrowing. Electronically  Signed   By: Elon Alas M.D.   On: 01/31/2018 23:52   Dg Chest Port 1 View  Result Date: 01/31/2018 CLINICAL DATA:  Chest pain after trip and fall striking table. EXAM: PORTABLE CHEST 1 VIEW COMPARISON:  08/16/2010 FINDINGS: Lower lung volumes from prior exam lead to bronchovascular crowding. Mild cardiomegaly is similar allowing for differences in technique, likely accentuated by low lung volumes. Unchanged mediastinal contours. No pneumothorax or focal airspace disease. No large pleural effusion. The bones are under mineralized. No acute osseous abnormalities are seen. IMPRESSION: Low lung volumes without acute abnormality. Electronically Signed   By: Keith Rake M.D.   On: 01/31/2018 22:23   Dg Knee Complete 4 Views Left  Result Date: 01/31/2018 CLINICAL DATA:  Trip and fall into table with bilateral knee pain. EXAM: LEFT KNEE - COMPLETE 4+ VIEW COMPARISON:  None. FINDINGS: No fracture or dislocation. No significant joint effusion. There is prepatellar soft tissue edema. Chondrocalcinosis of the medial and lateral tibiofemoral compartments with mild tricompartmental joint space narrowing and peripheral spurring. The bones are under mineralized. IMPRESSION: 1. Prepatellar soft tissue edema. No fracture or dislocation. 2. Chondrocalcinosis and mild tricompartmental osteoarthritis. Electronically Signed   By: Keith Rake M.D.   On: 01/31/2018 22:26   Dg Knee Complete 4 Views Right  Result Date: 01/31/2018 CLINICAL DATA:  Trip and fall into table with bilateral knee pain. EXAM: RIGHT KNEE - COMPLETE 4+ VIEW COMPARISON:  None. FINDINGS: No fracture or dislocation. No significant joint effusion. There is prepatellar soft tissue edema. Medial and lateral tibiofemoral chondrocalcinosis. Medial joint space narrowing with peripheral spurring. Small quadriceps tendon enthesophyte. IMPRESSION: 1. Prepatellar soft tissue edema.  No fracture or dislocation. 2. Mild osteoarthritis and  chondrocalcinosis. Electronically Signed   By: Keith Rake M.D.   On: 01/31/2018 22:27    Labs:  CBC: Recent Labs    10/06/17 1130 01/31/18 2145 01/31/18 2152 02/01/18 0245  WBC 8.2 7.6  --  9.7  HGB 14.3 12.2 12.6 13.4  HCT 43.5 37.0 37.0 40.8  PLT 258 183  --  203    COAGS: No results for input(s): INR, APTT in the last 8760 hours.  BMP: Recent Labs    10/06/17 1130 01/31/18 2145 01/31/18 2152 02/01/18 0245 02/02/18 0630  NA 141 137 140 135 134*  K 4.3 3.1* 3.1* 3.6 4.7  CL 103 109 107 102 102  CO2 23 20*  --  23 26  GLUCOSE 81 101* 93 128* 92  BUN 10 15 16 13 10   CALCIUM 9.8 8.3*  --  9.5 9.2  CREATININE 0.86 0.84 0.70 0.88 0.90  GFRNONAA 64 >60  --  60* 58*  GFRAA 73 >60  --  >60 >60    LIVER FUNCTION TESTS: Recent Labs    01/31/18 2145 02/01/18 0245  BILITOT 0.3 0.6  AST 23 25  ALT 16 19  ALKPHOS 53 59  PROT 5.9* 7.1  ALBUMIN 3.4* 3.9    TUMOR MARKERS: No results for input(s): AFPTM, CEA, CA199, CHROMGRNA in the last 8760 hours.  Assessment and Plan:  Thoracic 6 compression fracture. Plan for image-guided thoracic 6 kyphoplasty/vertebroplasty tentatively for Monday 10/7 with Dr. Estanislado Pandy. Patient will be NPO at midnight the night prior to procedure. Afebrile and WBCs WNL. She is currently taking Plavix 75 mg once daily- reports last dose 01/31/2018. INR pending.  Called patient's son, Vaness Jelinski, to update on plan. States  he agrees with plan.  Risks and benefits of thoracic 6 kyphoplasty/vertebroplasty were discussed with the patient including, but not limited to education regarding the natural healing process of compression fractures without intervention, bleeding, infection, cement migration which may cause spinal cord damage, paralysis, pulmonary embolism or even death. This interventional procedure involves the use of X-rays and because of the nature of the planned procedure, it is possible that we will have prolonged use of X-ray  fluoroscopy. Potential radiation risks to you include (but are not limited to) the following: - A slightly elevated risk for cancer  several years later in life. This risk is typically less than 0.5% percent. This risk is low in comparison to the normal incidence of human cancer, which is 33% for women and 50% for men according to the [place]. - Radiation induced injury can include skin redness, resembling a rash, tissue breakdown / ulcers and hair loss (which can be temporary or permanent).  The likelihood of either of these occurring depends on the difficulty of the procedure and whether you are sensitive to radiation due to previous procedures, disease, or genetic conditions.  IF your procedure requires a prolonged use of radiation, you will be notified and given written instructions for further action.  It is your responsibility to monitor the irradiated area for the 2 weeks following the procedure and to notify your physician if you are concerned that you have suffered a radiation induced injury.   All of the patient's questions were answered, patient is agreeable to proceed. Consent signed and in chart.   Thank you for this interesting consult.  I greatly enjoyed meeting JULIANNA VANWAGNER and look forward to participating in their care.  A copy of this report was sent to the requesting provider on this date.  Electronically Signed: Earley Abide, PA-C 02/04/2018, 10:25 AM   I spent a total of 40 Minutes in face to face in clinical consultation, greater than 50% of which was counseling/coordinating care for thoracic 6 compression fracture.

## 2018-02-04 NOTE — Progress Notes (Signed)
PROGRESS NOTE    Susan [name]  JOA:[id number] DOB: 23-May-1933 DOA: 01/31/2018 PCP: Crist Infante, MD    Brief Narrative:  84 year old female who presented after a mechanical fall.  She does have the significant past medical history for coronary artery disease, chronic diastolic heart failure, carotid vascular disease, dyslipidemia and hypothyroidism.  Patient sustained a mechanical fall from her own height, trauma to her chest and back, no head trauma loss of consciousness.  On her initial physical examination blood pressure 145/69, heart rate 72, temperature 98.2, respiratory rate 18, oxygen saturation 98%.  Her lungs were clear to auscultation bilaterally, heart S1-S2 present rhythmic, abdomen soft nontender, no lower extremity edema.  CT of the abdomen showed T6 subacute compression fracture with about 25% loss of height of the anterior vertebral body.   Patient was admitted to the hospital with working diagnosis of T6 subacute compression fracture.  02/02/2018: Patient reports severe migrainous symptoms.  Will treat patient's migraine acutely.  02/03/2018: Migraine has resolved.  Patient looks a lot better.  Kyphoplasty is planned for Monday, 02/07/2018.  02/04/2018: Patient seen.  Patient reports significant pain involving right lateral rib area.  Will get x-ray of the ribs.  Will start patient on fentanyl patch around the right rib cage.  Also start OxyContin 10 mg p.o. twice daily.  Will optimize patient's pain control.  No further migraine symptoms.  Assessment & Plan:   Active Problems:   Arteriosclerosis of coronary artery   Hypothyroidism   Hyperlipidemia   Cardiomyopathy ([place])   Hypokalemia   Closed wedge compression fracture of T6 vertebra (HCC)   Chest pain   Coronary artery disease   Closed wedge compression fracture of sixth thoracic vertebra (HCC)   1.  T6 acute/ subacute compression fracture. Patient with significant pain, worse with movement, will continue pain  control with hydromorphone IV, methocarbamol, and tramadol. Follow on MRI result and IR recommendations, possible hypoplasty. Physical therapy evaluation, recommendation for CIR.  02/03/2018: For possible kyphoplasty/vertebroplasty on Monday, 02/07/2018. 02/04/2018: See above for right rib cage pain.  2. Dyslipidemia. Continue rosuvastatin.   3. Hypothyroid. On levothyroxine.   4. Hypertension. Will continue blood pressure control with carvedilol.  5. Hypokalemia.  Resolved.    6.  Acute migraine: Resolved.  Further management depend on hospital course.   DVT prophylaxis: scd   Code Status: full Family Communication: I spoke with patient's family at the bedside and all questions were addressed.  Disposition Plan/ discharge barriers: pending MRI   Consultants:   IR  Antimicrobials:   None   Subjective: Reports significant pain involving right lateral rib cage area.  Migraine headache has resolved.    Objective: Vitals:   02/03/18 2023 02/04/18 0837 02/04/18 1119 02/04/18 1159  BP: (!) 119/49 131/66 121/61 (!) 110/44  Pulse: 65 60 63 60  Resp: (!) 5   15  Temp: 98.3 F (36.8 C) 98.7 F (37.1 C) 98.6 F (37 C) 98 F (36.7 C)  TempSrc: Oral Oral Oral Oral  SpO2: 96% 97% 97% 97%  Weight:      Height:        Intake/Output Summary (Last 24 hours) at 02/04/2018 1435 Last data filed at 02/04/2018 0400 Gross per 24 hour  Intake 240 ml  Output 1 ml  Net 239 ml   Filed Weights   01/31/18 2110  Weight: 57.6 kg    Examination:   General: In mild distress secondary to the migraine attack.   Neurology: Awake and alert, non focal  HEENT: mild pallor, no icterus, oral mucosa moist Cardiovascular: S1-S2 Pulmonary: Clear to auscultation.   Gastrointestinal. Abdomen with no organomegaly, non tender, no rebound or guarding   Data Reviewed: I have personally reviewed following labs and imaging studies  CBC: Recent Labs  Lab 01/31/18 2145 01/31/18 2152  02/01/18 0245  WBC 7.6  --  9.7  NEUTROABS 4.8  --   --   HGB 12.2 12.6 13.4  HCT 37.0 37.0 40.8  MCV 98.7  --  97.6  PLT 183  --  725   Basic Metabolic Panel: Recent Labs  Lab 01/31/18 2145 01/31/18 2152 02/01/18 0245 02/02/18 0630  NA 137 140 135 134*  K 3.1* 3.1* 3.6 4.7  CL 109 107 102 102  CO2 20*  --  23 26  GLUCOSE 101* 93 128* 92  BUN 15 16 13 10   CREATININE 0.84 0.70 0.88 0.90  CALCIUM 8.3*  --  9.5 9.2  MG  --   --  2.1  --   PHOS  --   --  4.0  --    GFR: Estimated Creatinine Clearance: 39.9 mL/min (by C-G formula based on SCr of 0.9 mg/dL). Liver Function Tests: Recent Labs  Lab 01/31/18 2145 02/01/18 0245  AST 23 25  ALT 16 19  ALKPHOS 53 59  BILITOT 0.3 0.6  PROT 5.9* 7.1  ALBUMIN 3.4* 3.9   No results for input(s): LIPASE, AMYLASE in the last 168 hours. No results for input(s): AMMONIA in the last 168 hours. Coagulation Profile: Recent Labs  Lab 02/04/18 1108  INR 1.00   Cardiac Enzymes: No results for input(s): CKTOTAL, CKMB, CKMBINDEX, TROPONINI in the last 168 hours. BNP (last 3 results) No results for input(s): PROBNP in the last 8760 hours. HbA1C: No results for input(s): HGBA1C in the last 72 hours. CBG: No results for input(s): GLUCAP in the last 168 hours. Lipid Profile: No results for input(s): CHOL, HDL, LDLCALC, TRIG, CHOLHDL, LDLDIRECT in the last 72 hours. Thyroid Function Tests: No results for input(s): TSH, T4TOTAL, FREET4, T3FREE, THYROIDAB in the last 72 hours. Anemia Panel: No results for input(s): VITAMINB12, FOLATE, FERRITIN, TIBC, IRON, RETICCTPCT in the last 72 hours.    Radiology Studies: I have reviewed all of the imaging during this hospital visit personally     Scheduled Meds: . carvedilol  3.125 mg Oral BID WC  . levothyroxine  75 mcg Oral QAC breakfast  . lidocaine  1 patch Transdermal Q24H  . potassium chloride  40 mEq Oral Once  . rosuvastatin  40 mg Oral Daily  . senna  1 tablet Oral BID  .  venlafaxine XR  150 mg Oral Daily   Continuous Infusions:    LOS: 1 day        Bonnell Public, MD [facility] Pager [phone]

## 2018-02-04 NOTE — Plan of Care (Signed)
  Problem: Education: Goal: Knowledge of General Education information will improve Description Including pain rating scale, medication(s)/side effects and non-pharmacologic comfort measures Outcome: Progressing   

## 2018-02-04 NOTE — Progress Notes (Signed)
Physical Therapy Treatment Patient Details Name: Susan [name] MRN: [id number] DOB: November 22, 1933 Today's Date: 02/04/2018    History of Present Illness Pt is an 84 y/o female who presents s/p fall at home. Pt hit her R knee and top of her chin on the cofee table and sustained a T6 fracture. C-spine cleared. PMH significant for osteoporosis, LAD stenosis, hypothyroidism, CAD, right/left heart cath and coronary angiography 10/08/2017, lumbar laminectomy, fractured wrist repair.     PT Comments    Pt performed mobility only during session that was limited due to dizziness, nausea, pain and fatigue.  Pt is slow and guarded and reports discomfort with application of TLSO.  She is unable to recall spinal precautions and unable to apply brace with out total assist.  Physically patient not presenting the same as prior PT encounter.  She reports continued pain in R rib cage and indegestion and nause.  Informed RN of patient's appearance and symptoms.  Also placed call to hospitalist about patient post session.  Will continued to recommend Rehab in a post acute setting to improve strength and function before returning home.     Follow Up Recommendations  CIR;Supervision/Assistance - 24 hour     Equipment Recommendations  Rolling walker with 5" wheels    Recommendations for Other Services Rehab consult     Precautions / Restrictions Precautions Precautions: Fall;Back Precaution Booklet Issued: Yes (comment) Precaution Comments: Pt was educated on precautions and cued for maintenance of precautions during functional mobility.  Required Braces or Orthoses: Spinal Brace Spinal Brace: Thoracolumbosacral orthotic;Applied in sitting position Restrictions Weight Bearing Restrictions: No Other Position/Activity Restrictions: Total assistance to donn brace but unable to tolerate wearing.  Performed x1 transfer before requesting to have brace removed.      Mobility  Bed Mobility Overal bed mobility:  Needs Assistance Bed Mobility: Sidelying to Sit;Sit to Sidelying Rolling: (Pt resting in sidelying on arrival and returned to sidelying.  ) Sidelying to sit: Mod assist;+2 for physical assistance     Sit to sidelying: Mod assist;+2 for physical assistance General bed mobility comments: VCs for maintaining neutral alignment of spine during transitions.  Pt is slow and guarded and required cues for maintaining spinal precautions.  Pt reports feeling naseous edge of bed and belching no emesis.    Transfers Overall transfer level: Needs assistance Equipment used: Rolling walker (2 wheeled) Transfers: Sit to/from Stand Sit to Stand: Mod assist;+2 safety/equipment         General transfer comment: VCs for hand placement to and from seated surface.  Pt is only able to stand briefly before returning to seated position with complaints of dizziness. BP 107/71 and temp 97.9, face appears flushed and she is unable to keep eyes open.    Ambulation/Gait                 Stairs             Wheelchair Mobility    Modified Rankin (Stroke Patients Only)       Balance Overall balance assessment: Needs assistance   Sitting balance-Leahy Scale: Fair       Standing balance-Leahy Scale: Poor Standing balance comment: Reliance on BUE support at this time.                             Cognition Arousal/Alertness: Lethargic;Suspect due to medications Behavior During Therapy: Flat affect Overall Cognitive Status: Impaired/Different from baseline Area of Impairment: Safety/judgement;Following commands  Following Commands: Follows one step commands inconsistently Safety/Judgement: Decreased awareness of safety;Decreased awareness of deficits            Exercises      General Comments        Pertinent Vitals/Pain Pain Assessment: 0-10 Pain Score: 6  Pain Location: abdomen and R side of rib cage, back  Pain Descriptors /  Indicators: Discomfort;Grimacing;Other (Comment) Pain Intervention(s): Monitored during session;Repositioned    Home Living                      Prior Function            PT Goals (current goals can now be found in the care plan section) Acute Rehab PT Goals Patient Stated Goal: To lay in bed and feel better.  Potential to Achieve Goals: Good Progress towards PT goals: Not progressing toward goals - comment(Pt with regression in physical mobility due to nausea and dizziness.  )    Frequency    Min 4X/week      PT Plan Current plan remains appropriate    Co-evaluation              AM-PAC PT "6 Clicks" Daily Activity  Outcome Measure  Difficulty turning over in bed (including adjusting bedclothes, sheets and blankets)?: Unable Difficulty moving from lying on back to sitting on the side of the bed? : Unable Difficulty sitting down on and standing up from a chair with arms (e.g., wheelchair, bedside commode, etc,.)?: Unable Help needed moving to and from a bed to chair (including a wheelchair)?: A Little Help needed walking in hospital room?: A Little Help needed climbing 3-5 steps with a railing? : Total 6 Click Score: 10    End of Session Equipment Utilized During Treatment: Gait belt;Back brace Activity Tolerance: Patient limited by pain Patient left: in bed;with call bell/phone within reach;with family/visitor present(in sidelying. ) Nurse Communication: Mobility status PT Visit Diagnosis: Unsteadiness on feet (R26.81);Pain;Difficulty in walking, not elsewhere classified (R26.2) Pain - Right/Left: Right Pain - part of body: (back and ribs.  )     Time: 4580-9983 PT Time Calculation (min) (ACUTE ONLY): 13 min  Charges:  $Therapeutic Activity: 8-22 mins                     Governor Rooks, PTA Acute Rehabilitation Services Pager [phone] Office [phone]     Susan [name] Eli Hose 02/04/2018, 12:23 PM

## 2018-02-05 NOTE — Progress Notes (Signed)
Physical Therapy Treatment Patient Details Name: Susan [name] MRN: [id number] DOB: [birth year] Today's Date: 02/05/2018    History of Present Illness Pt is an 84 y/o female who presents s/p fall at home. Pt hit her R knee and top of her chin on the cofee table and sustained a T6 fracture. C-spine cleared. PMH significant for osteoporosis, LAD stenosis, hypothyroidism, CAD, right/left heart cath and coronary angiography 10/08/2017, lumbar laminectomy, fractured wrist repair.     PT Comments    Pt seated in recliner chair with TLSO donned incorrectly.  Pt required reapplication of brace with instruction to nursing and daughter.  Pt reports feeling much better.  Pt is progressing well.  Pt continues to require rehab in a post acute setting to address balance, safety and endurance before returning home.      Follow Up Recommendations  CIR;Supervision/Assistance - 24 hour     Equipment Recommendations  Rolling walker with 5" wheels    Recommendations for Other Services Rehab consult     Precautions / Restrictions Precautions Precautions: Fall;Back Precaution Booklet Issued: Yes (comment) Precaution Comments: Pt was educated on precautions and cued for maintenance of precautions during functional mobility.  Required Braces or Orthoses: Spinal Brace Spinal Brace: Thoracolumbosacral orthotic;Applied in sitting position Restrictions Weight Bearing Restrictions: No Other Position/Activity Restrictions: Pt resting in chair on arrival with TLSO donned incorrectly, re-educated patient's daughter and nurse on correct application.  Pt able to recall 3/3 precautions.      Mobility  Bed Mobility               General bed mobility comments: Pt seated in recliner on arrival with TLSO donned incorrectly  Transfers Overall transfer level: Needs assistance Equipment used: Rolling walker (2 wheeled) Transfers: Sit to/from Stand Sit to Stand: Min assist         General transfer comment:  Cues for hand placement to and from seated surface.  Pt moving quickly and impulsively.    Ambulation/Gait Ambulation/Gait assistance: Min assist Gait Distance (Feet): 100 Feet(x2 trials.  ) Assistive device: Rolling walker (2 wheeled) Gait Pattern/deviations: Decreased stride length;Trunk flexed;Step-through pattern Gait velocity: Decreased   General Gait Details: Cues for upper trunk control and increasing B stride length.  Pt slow and guarded during turns, cues to avoid twisting.     Stairs             Wheelchair Mobility    Modified Rankin (Stroke Patients Only)       Balance Overall balance assessment: Needs assistance Sitting-balance support: Feet supported;No upper extremity supported Sitting balance-Leahy Scale: Fair       Standing balance-Leahy Scale: Poor                              Cognition Arousal/Alertness: Awake/alert Behavior During Therapy: WFL for tasks assessed/performed Overall Cognitive Status: Within Functional Limits for tasks assessed                                 General Comments: Pt is alert and oriented during session.        Exercises      General Comments        Pertinent Vitals/Pain Pain Assessment: Faces Faces Pain Scale: Hurts little more Pain Location: R rib cage Pain Descriptors / Indicators: Discomfort;Grimacing;Other (Comment) Pain Intervention(s): Monitored during session;Repositioned    Home Living  Prior Function            PT Goals (current goals can now be found in the care plan section) Acute Rehab PT Goals Patient Stated Goal: To lay in bed and feel better.  Potential to Achieve Goals: Good Progress towards PT goals: Progressing toward goals    Frequency    Min 4X/week      PT Plan Current plan remains appropriate    Co-evaluation              AM-PAC PT "6 Clicks" Daily Activity  Outcome Measure  Difficulty turning over in  bed (including adjusting bedclothes, sheets and blankets)?: Unable Difficulty moving from lying on back to sitting on the side of the bed? : Unable Difficulty sitting down on and standing up from a chair with arms (e.g., wheelchair, bedside commode, etc,.)?: Unable Help needed moving to and from a bed to chair (including a wheelchair)?: A Little Help needed walking in hospital room?: A Little Help needed climbing 3-5 steps with a railing? : Total 6 Click Score: 10    End of Session Equipment Utilized During Treatment: Gait belt;Back brace Activity Tolerance: Patient limited by pain Patient left: in chair;with call bell/phone within reach;with family/visitor present Nurse Communication: Mobility status(brace application) PT Visit Diagnosis: Unsteadiness on feet (R26.81);Pain;Difficulty in walking, not elsewhere classified (R26.2) Pain - Right/Left: Right Pain - part of body: (back and ribs)     Time: 8270-7867 PT Time Calculation (min) (ACUTE ONLY): 20 min  Charges:  $Gait Training: 8-22 mins                     Governor Rooks, PTA Acute Rehabilitation Services Pager [phone] Office [phone]     Krimson Massmann Eli Hose 02/05/2018, 2:48 PM

## 2018-02-05 NOTE — Progress Notes (Signed)
PROGRESS NOTE    Susan [name]  ZOX:[id number] DOB: [birth year] DOA: 01/31/2018 PCP: Crist Infante, MD    Brief Narrative:  84 year old female who presented after a mechanical fall.  She does have the significant past medical history for coronary artery disease, chronic diastolic heart failure, carotid vascular disease, dyslipidemia and hypothyroidism.  Patient sustained a mechanical fall from her own height, trauma to her chest and back, no head trauma loss of consciousness.  On her initial physical examination blood pressure 145/69, heart rate 72, temperature 98.2, respiratory rate 18, oxygen saturation 98%.  Her lungs were clear to auscultation bilaterally, heart S1-S2 present rhythmic, abdomen soft nontender, no lower extremity edema.  CT of the abdomen showed T6 subacute compression fracture with about 25% loss of height of the anterior vertebral body.   Patient was admitted to the hospital with working diagnosis of T6 subacute compression fracture.  02/02/2018: Patient reports severe migrainous symptoms.  Will treat patient's migraine acutely.  02/03/2018: Migraine has resolved.  Patient looks a lot better.  Kyphoplasty is planned for Monday, 02/07/2018.  02/04/2018: Patient seen.  Patient reports significant pain involving right lateral rib area.  Will get x-ray of the ribs.  Will start patient on fentanyl patch around the right rib cage.  Also start OxyContin 10 mg p.o. twice daily.  Will optimize patient's pain control.  No further migraine symptoms.  02/05/2018: Patient looks a lot better today.  The pain is well controlled.  Patient was seen alongside patient's daughter-in-law.  Assessment & Plan:   Active Problems:   Arteriosclerosis of coronary artery   Hypothyroidism   Hyperlipidemia   Cardiomyopathy ([place])   Hypokalemia   Closed wedge compression fracture of T6 vertebra (HCC)   Chest pain   Coronary artery disease   Closed wedge compression fracture of sixth thoracic  vertebra (HCC)   1.  T6 acute/ subacute compression fracture. Patient with significant pain, worse with movement, will continue pain control with hydromorphone IV, methocarbamol, and tramadol. Follow on MRI result and IR recommendations, possible hypoplasty. Physical therapy evaluation, recommendation for CIR.  02/03/2018: For possible kyphoplasty/vertebroplasty on Monday, 02/07/2018. 02/04/2018: See above for right rib cage pain. 02/05/2018: Pain is controlled.  For kyphoplasty on Monday, 02/07/2018.  2. Dyslipidemia. Continue rosuvastatin.   3. Hypothyroid. On levothyroxine.   4. Hypertension. Will continue blood pressure control with carvedilol.  5. Hypokalemia.  Resolved.    6.  Acute migraine: Resolved.  Further management depend on hospital course.   DVT prophylaxis: scd   Code Status: full Family Communication: I spoke with patient's family at the bedside and all questions were addressed.  Disposition Plan/ discharge barriers: pending MRI   Consultants:   IR  Antimicrobials:   None   Subjective: Pain is controlled.   Patient feels a lot better today.    Objective: Vitals:   02/04/18 1159 02/04/18 2020 02/05/18 0445 02/05/18 1524  BP: (!) 110/44 (!) 103/48 118/70 (!) 97/59  Pulse: 60 (!) 56 62 62  Resp: 15 16 18 16   Temp: 98 F (36.7 C) 98.2 F (36.8 C) 97.9 F (36.6 C) (!) 97.5 F (36.4 C)  TempSrc: Oral Oral Oral Oral  SpO2: 97% 95% 98% 100%  Weight:      Height:        Intake/Output Summary (Last 24 hours) at 02/05/2018 1700 Last data filed at 02/05/2018 1524 Gross per 24 hour  Intake 480 ml  Output 1 ml  Net 479 ml   [employer]  01/31/18 2110  Weight: 57.6 kg    Examination:   General: In mild distress secondary to the migraine attack.   Neurology: Awake and alert, non focal  HEENT: mild pallor, no icterus, oral mucosa moist Cardiovascular: S1-S2 Pulmonary: Clear to auscultation.   Gastrointestinal. Abdomen with no organomegaly,  non tender, no rebound or guarding   Data Reviewed: I have personally reviewed following labs and imaging studies  CBC: Recent Labs  Lab 01/31/18 2145 01/31/18 2152 02/01/18 0245  WBC 7.6  --  9.7  NEUTROABS 4.8  --   --   HGB 12.2 12.6 13.4  HCT 37.0 37.0 40.8  MCV 98.7  --  97.6  PLT 183  --  962   Basic Metabolic Panel: Recent Labs  Lab 01/31/18 2145 01/31/18 2152 02/01/18 0245 02/02/18 0630  NA 137 140 135 134*  K 3.1* 3.1* 3.6 4.7  CL 109 107 102 102  CO2 20*  --  23 26  GLUCOSE 101* 93 128* 92  BUN 15 16 13 10   CREATININE 0.84 0.70 0.88 0.90  CALCIUM 8.3*  --  9.5 9.2  MG  --   --  2.1  --   PHOS  --   --  4.0  --    GFR: Estimated Creatinine Clearance: 39.9 mL/min (by C-G formula based on SCr of 0.9 mg/dL). Liver Function Tests: Recent Labs  Lab 01/31/18 2145 02/01/18 0245  AST 23 25  ALT 16 19  ALKPHOS 53 59  BILITOT 0.3 0.6  PROT 5.9* 7.1  ALBUMIN 3.4* 3.9   No results for input(s): LIPASE, AMYLASE in the last 168 hours. No results for input(s): AMMONIA in the last 168 hours. Coagulation Profile: Recent Labs  Lab 02/04/18 1108  INR 1.00   Cardiac Enzymes: No results for input(s): CKTOTAL, CKMB, CKMBINDEX, TROPONINI in the last 168 hours. BNP (last 3 results) No results for input(s): PROBNP in the last 8760 hours. HbA1C: No results for input(s): HGBA1C in the last 72 hours. CBG: No results for input(s): GLUCAP in the last 168 hours. Lipid Profile: No results for input(s): CHOL, HDL, LDLCALC, TRIG, CHOLHDL, LDLDIRECT in the last 72 hours. Thyroid Function Tests: No results for input(s): TSH, T4TOTAL, FREET4, T3FREE, THYROIDAB in the last 72 hours. Anemia Panel: No results for input(s): VITAMINB12, FOLATE, FERRITIN, TIBC, IRON, RETICCTPCT in the last 72 hours.    Radiology Studies: I have reviewed all of the imaging during this hospital visit personally     Scheduled Meds: . carvedilol  3.125 mg Oral BID WC  . levothyroxine  75  mcg Oral QAC breakfast  . lidocaine  1 patch Transdermal Q24H  . oxyCODONE  10 mg Oral Q12H  . potassium chloride  40 mEq Oral Once  . rosuvastatin  40 mg Oral Daily  . senna  1 tablet Oral BID  . venlafaxine XR  150 mg Oral Daily   Continuous Infusions:    LOS: 2 days        Bonnell Public, MD [facility] Pager [phone]

## 2018-02-06 ENCOUNTER — Encounter (HOSPITAL_COMMUNITY): Payer: Self-pay | Admitting: *Deleted

## 2018-02-06 LAB — RENAL FUNCTION PANEL
Albumin: 3.4 g/dL — ABNORMAL LOW (ref 3.5–5.0)
Anion gap: 9 (ref 5–15)
BUN: 13 mg/dL (ref 8–23)
CO2: 27 mmol/L (ref 22–32)
Calcium: 9.1 mg/dL (ref 8.9–10.3)
Chloride: 98 mmol/L (ref 98–111)
Creatinine, Ser: 1 mg/dL (ref 0.44–1.00)
GFR calc Af Amer: 59 mL/min — ABNORMAL LOW (ref >60)
GFR calc non Af Amer: 51 mL/min — ABNORMAL LOW (ref >60)
Glucose, Bld: 84 mg/dL (ref 70–99)
Phosphorus: 3.3 mg/dL (ref 2.5–4.6)
Potassium: 4 mmol/L (ref 3.5–5.1)
Sodium: 134 mmol/L — ABNORMAL LOW (ref 135–145)

## 2018-02-06 LAB — CBC WITH DIFFERENTIAL/PLATELET
Abs Immature Granulocytes: 0 10*3/uL (ref 0.0–0.1)
Basophils Absolute: 0 10*3/uL (ref 0.0–0.1)
Basophils Relative: 1 %
Eosinophils Absolute: 0.1 10*3/uL (ref 0.0–0.7)
Eosinophils Relative: 1 %
HCT: 41 % (ref 36.0–46.0)
Hemoglobin: 13.1 g/dL (ref 12.0–15.0)
Immature Granulocytes: 0 %
Lymphocytes Relative: 35 %
Lymphs Abs: 2.8 10*3/uL (ref 0.7–4.0)
MCH: 31.9 pg (ref 26.0–34.0)
MCHC: 32 g/dL (ref 30.0–36.0)
MCV: 99.8 fL (ref 78.0–100.0)
Monocytes Absolute: 0.9 10*3/uL (ref 0.1–1.0)
Monocytes Relative: 12 %
Neutro Abs: 4.2 10*3/uL (ref 1.7–7.7)
Neutrophils Relative %: 51 %
Platelets: 214 10*3/uL (ref 150–400)
RBC: 4.11 MIL/uL (ref 3.87–5.11)
RDW: 13.7 % (ref 11.5–15.5)
WBC: 8.1 10*3/uL (ref 4.0–10.5)

## 2018-02-06 LAB — MAGNESIUM: Magnesium: 2.2 mg/dL (ref 1.7–2.4)

## 2018-02-06 MED ORDER — BISACODYL 5 MG PO TBEC
10.0000 mg | DELAYED_RELEASE_TABLET | Freq: Once | ORAL | Status: AC
  Administered 2018-02-06: 10 mg via ORAL
  Filled 2018-02-06: qty 2

## 2018-02-06 MED ORDER — POLYETHYLENE GLYCOL 3350 17 G PO PACK
17.0000 g | PACK | Freq: Every day | ORAL | Status: DC
  Administered 2018-02-06 – 2018-02-11 (×5): 17 g via ORAL
  Filled 2018-02-06 (×5): qty 1

## 2018-02-06 NOTE — Progress Notes (Signed)
PROGRESS NOTE    Susan [name]  PNT:[id number] DOB: Nov 04, 1933 DOA: 01/31/2018 PCP: Crist Infante, MD    Brief Narrative:  84 year old female who presented after a mechanical fall.  She does have the significant past medical history for coronary artery disease, chronic diastolic heart failure, carotid vascular disease, dyslipidemia and hypothyroidism.  Patient sustained a mechanical fall from her own height, trauma to her chest and back, no head trauma loss of consciousness.  On her initial physical examination blood pressure 145/69, heart rate 72, temperature 98.2, respiratory rate 18, oxygen saturation 98%.  Her lungs were clear to auscultation bilaterally, heart S1-S2 present rhythmic, abdomen soft nontender, no lower extremity edema.  CT of the abdomen showed T6 subacute compression fracture with about 25% loss of height of the anterior vertebral body.   Patient was admitted to the hospital with working diagnosis of T6 subacute compression fracture.  02/02/2018: Patient reports severe migrainous symptoms.  Will treat patient's migraine acutely.  02/03/2018: Migraine has resolved.  Patient looks a lot better.  Kyphoplasty is planned for Monday, 02/07/2018.  02/04/2018: Patient seen.  Patient reports significant pain involving right lateral rib area.  Will get x-ray of the ribs.  Will start patient on fentanyl patch around the right rib cage.  Also start OxyContin 10 mg p.o. twice daily.  Will optimize patient's pain control.  No further migraine symptoms.  02/05/2018: Patient looks a lot better today.  The pain is well controlled.  Patient was seen alongside patient's daughter-in-law.  02/06/2018: No new complaints.  For kyphoplasty in the morning.  Rehab (CIR) is planned after kyphoplasty.  Pain is reasonably controlled.  Assessment & Plan:   Active Problems:   Arteriosclerosis of coronary artery   Hypothyroidism   Hyperlipidemia   Cardiomyopathy ([place])   Hypokalemia   Closed wedge  compression fracture of T6 vertebra (HCC)   Chest pain   Coronary artery disease   Closed wedge compression fracture of sixth thoracic vertebra (HCC)   1.  T6 acute/ subacute compression fracture. Patient with significant pain, worse with movement, will continue pain control with hydromorphone IV, methocarbamol, and tramadol. Follow on MRI result and IR recommendations, possible hypoplasty. Physical therapy evaluation, recommendation for CIR. 02/06/2018: Pain is controlled.  For kyphoplasty on Monday, 02/07/2018.  2. Dyslipidemia. Continue rosuvastatin.   3. Hypothyroid. On levothyroxine.   4. Hypertension. Will continue blood pressure control with carvedilol.  5. Hypokalemia.  Resolved.    6.  Acute migraine: Resolved.  Further management depend on hospital course.   DVT prophylaxis: scd   Code Status: full Family Communication: I spoke with patient's family at the bedside and all questions were addressed.  Disposition Plan/ discharge barriers: pending MRI   Consultants:   IR  Antimicrobials:   None   Subjective: Pain is controlled.    Objective: Vitals:   02/05/18 1524 02/05/18 2030 02/06/18 0459 02/06/18 1540  BP: (!) 97/59 95/62 (!) 92/54 (!) 96/44  Pulse: 62 69 70 67  Resp: 16 16 14 17   Temp: (!) 97.5 F (36.4 C) 98 F (36.7 C) 97.8 F (36.6 C) 98.6 F (37 C)  TempSrc: Oral Oral Oral Oral  SpO2: 100% 100% 96% 95%  Weight:      Height:        Intake/Output Summary (Last 24 hours) at 02/06/2018 1559 Last data filed at 02/06/2018 1357 Gross per 24 hour  Intake 840 ml  Output 200 ml  Net 640 ml   Filed Weights   01/31/18  2110  Weight: 57.6 kg    Examination:   General: In mild distress secondary to the migraine attack.   Neurology: Awake and alert, non focal  HEENT: mild pallor, no icterus, oral mucosa moist Cardiovascular: S1-S2 Pulmonary: Clear to auscultation.   Gastrointestinal. Abdomen with no organomegaly, non tender, no rebound or  guarding   Data Reviewed: I have personally reviewed following labs and imaging studies  CBC: Recent Labs  Lab 01/31/18 2145 01/31/18 2152 02/01/18 0245 02/06/18 0752  WBC 7.6  --  9.7 8.1  NEUTROABS 4.8  --   --  4.2  HGB 12.2 12.6 13.4 13.1  HCT 37.0 37.0 40.8 41.0  MCV 98.7  --  97.6 99.8  PLT 183  --  203 287   Basic Metabolic Panel: Recent Labs  Lab 01/31/18 2145 01/31/18 2152 02/01/18 0245 02/02/18 0630 02/06/18 0752  NA 137 140 135 134* 134*  K 3.1* 3.1* 3.6 4.7 4.0  CL 109 107 102 102 98  CO2 20*  --  23 26 27   GLUCOSE 101* 93 128* 92 84  BUN 15 16 13 10 13   CREATININE 0.84 0.70 0.88 0.90 1.00  CALCIUM 8.3*  --  9.5 9.2 9.1  MG  --   --  2.1  --  2.2  PHOS  --   --  4.0  --  3.3   GFR: Estimated Creatinine Clearance: 35.9 mL/min (by C-G formula based on SCr of 1 mg/dL). Liver Function Tests: Recent Labs  Lab 01/31/18 2145 02/01/18 0245 02/06/18 0752  AST 23 25  --   ALT 16 19  --   ALKPHOS 53 59  --   BILITOT 0.3 0.6  --   PROT 5.9* 7.1  --   ALBUMIN 3.4* 3.9 3.4*   No results for input(s): LIPASE, AMYLASE in the last 168 hours. No results for input(s): AMMONIA in the last 168 hours. Coagulation Profile: Recent Labs  Lab 02/04/18 1108  INR 1.00   Cardiac Enzymes: No results for input(s): CKTOTAL, CKMB, CKMBINDEX, TROPONINI in the last 168 hours. BNP (last 3 results) No results for input(s): PROBNP in the last 8760 hours. HbA1C: No results for input(s): HGBA1C in the last 72 hours. CBG: No results for input(s): GLUCAP in the last 168 hours. Lipid Profile: No results for input(s): CHOL, HDL, LDLCALC, TRIG, CHOLHDL, LDLDIRECT in the last 72 hours. Thyroid Function Tests: No results for input(s): TSH, T4TOTAL, FREET4, T3FREE, THYROIDAB in the last 72 hours. Anemia Panel: No results for input(s): VITAMINB12, FOLATE, FERRITIN, TIBC, IRON, RETICCTPCT in the last 72 hours.    Radiology Studies: I have reviewed all of the imaging during  this hospital visit personally     Scheduled Meds: . bisacodyl  10 mg Oral Once  . carvedilol  3.125 mg Oral BID WC  . levothyroxine  75 mcg Oral QAC breakfast  . lidocaine  1 patch Transdermal Q24H  . oxyCODONE  10 mg Oral Q12H  . polyethylene glycol  17 g Oral Daily  . potassium chloride  40 mEq Oral Once  . rosuvastatin  40 mg Oral Daily  . senna  1 tablet Oral BID  . venlafaxine XR  150 mg Oral Daily   Continuous Infusions:    LOS: 3 days        Bonnell Public, MD [facility] Pager [phone]

## 2018-02-07 ENCOUNTER — Ambulatory Visit: Payer: Medicare Other | Admitting: Cardiovascular Disease

## 2018-02-07 ENCOUNTER — Inpatient Hospital Stay (HOSPITAL_COMMUNITY): Payer: Medicare Other

## 2018-02-07 HISTORY — PX: IR VERTEBROPLASTY CERV/THOR BX INC UNI/BIL INC/INJECT/IMAGING: IMG5515

## 2018-02-07 LAB — SURGICAL PCR SCREEN
MRSA, PCR: NEGATIVE
Staphylococcus aureus: NEGATIVE

## 2018-02-07 MED ORDER — TOBRAMYCIN SULFATE 1.2 G IJ SOLR
INTRAMUSCULAR | Status: AC
  Filled 2018-02-07: qty 1.2

## 2018-02-07 MED ORDER — CEFAZOLIN SODIUM-DEXTROSE 2-4 GM/100ML-% IV SOLN
2.0000 g | Freq: Once | INTRAVENOUS | Status: AC
  Administered 2018-02-07: 2 g via INTRAVENOUS

## 2018-02-07 MED ORDER — MIDAZOLAM HCL 2 MG/2ML IJ SOLN
INTRAMUSCULAR | Status: AC
  Filled 2018-02-07: qty 6

## 2018-02-07 MED ORDER — FENTANYL CITRATE (PF) 100 MCG/2ML IJ SOLN
INTRAMUSCULAR | Status: AC | PRN
  Administered 2018-02-07: 25 ug via INTRAVENOUS

## 2018-02-07 MED ORDER — TOBRAMYCIN SULFATE 1.2 G IJ SOLR
INTRAMUSCULAR | Status: AC | PRN
  Administered 2018-02-07: .01 g via TOPICAL

## 2018-02-07 MED ORDER — BUPIVACAINE HCL (PF) 0.5 % IJ SOLN
INTRAMUSCULAR | Status: AC | PRN
  Administered 2018-02-07: 20 mL

## 2018-02-07 MED ORDER — FENTANYL CITRATE (PF) 100 MCG/2ML IJ SOLN
INTRAMUSCULAR | Status: AC
  Filled 2018-02-07: qty 4

## 2018-02-07 MED ORDER — BUPIVACAINE HCL (PF) 0.5 % IJ SOLN
INTRAMUSCULAR | Status: AC
  Filled 2018-02-07: qty 30

## 2018-02-07 MED ORDER — MIDAZOLAM HCL 2 MG/2ML IJ SOLN
INTRAMUSCULAR | Status: AC | PRN
  Administered 2018-02-07: 1 mg via INTRAVENOUS

## 2018-02-07 MED ORDER — GELATIN ABSORBABLE 12-7 MM EX MISC
CUTANEOUS | Status: AC
  Filled 2018-02-07: qty 1

## 2018-02-07 MED ORDER — CEFAZOLIN SODIUM-DEXTROSE 2-4 GM/100ML-% IV SOLN
INTRAVENOUS | Status: AC
  Administered 2018-02-07: 2 g via INTRAVENOUS
  Filled 2018-02-07: qty 100

## 2018-02-07 MED ORDER — IOPAMIDOL (ISOVUE-300) INJECTION 61%
INTRAVENOUS | Status: AC
  Administered 2018-02-07: 3 mL
  Filled 2018-02-07: qty 50

## 2018-02-07 MED ORDER — HYDROMORPHONE HCL 1 MG/ML IJ SOLN
INTRAMUSCULAR | Status: AC
  Filled 2018-02-07: qty 1

## 2018-02-07 MED ORDER — SODIUM CHLORIDE 0.9 % IV SOLN
INTRAVENOUS | Status: AC
  Administered 2018-02-07: 11:00:00 via INTRAVENOUS

## 2018-02-07 NOTE — Progress Notes (Signed)
Inpatient Rehabilitation  Note surgical plans for today.  Will await post-op therapy updates to determine post acute rehab needs.  Call if questions.   Carmelia Roller., CCC/SLP Admission Coordinator  [facility]  Cell [phone]

## 2018-02-07 NOTE — Sedation Documentation (Signed)
Patient is resting comfortably. 

## 2018-02-07 NOTE — Discharge Instructions (Signed)
1. No stooping,bending or lifting more than 10 lbs for 2 weeks. 2. Use walker to ambulate for  2 weeks. 3.RTC PRN 2 weeks

## 2018-02-07 NOTE — Sedation Documentation (Signed)
dsg upper back intact, dry

## 2018-02-07 NOTE — Progress Notes (Signed)
Physical Therapy Treatment Patient Details Name: Susan [name] MRN: [id number] DOB: 06/08/1933 Today's Date: 02/07/2018    History of Present Illness Pt is an 84 y/o female who presents s/p fall at home. Pt hit her R knee and top of her chin on the cofee table and sustained a T6 fracture. C-spine cleared. PMH significant for osteoporosis, LAD stenosis, hypothyroidism, CAD, right/left heart cath and coronary angiography 10/08/2017, lumbar laminectomy, fractured wrist repair.     PT Comments    Patient seen for mobility progression. Pt able to recall 3/3 back precautions however needs mod cues while mobilizing to maintain precautions.  Pt requires min A for functional transfers and min guard/min A for gait training with RW. Pt will benefit from further skilled PT services to maximize independence and safety with mobility prior to return home alone.    Follow Up Recommendations  CIR;Supervision/Assistance - 24 hour     Equipment Recommendations  Rolling walker with 5" wheels    Recommendations for Other Services Rehab consult     Precautions / Restrictions Precautions Precautions: Fall;Back Precaution Comments: pt able to recall 3/3 precautions but does need cues to adhere to precautions while mobilizing Required Braces or Orthoses: Spinal Brace    Mobility  Bed Mobility Overal bed mobility: Needs Assistance Bed Mobility: Sidelying to Sit;Rolling Rolling: Min guard(Pt resting in sidelying on arrival and returned to sidelying.  ) Sidelying to sit: Min guard;HOB elevated       General bed mobility comments: min guard for safety with cues for sequencing   Transfers Overall transfer level: Needs assistance Equipment used: Rolling walker (2 wheeled) Transfers: Sit to/from Stand Sit to Stand: Min assist         General transfer comment: cues for safe hand placement and assist to power up into standing   Ambulation/Gait Ambulation/Gait assistance: Min assist;Min guard Gait  Distance (Feet): (165ft X 2 trials) Assistive device: Rolling walker (2 wheeled) Gait Pattern/deviations: Decreased stride length;Trunk flexed;Step-through pattern;Shuffle Gait velocity: Decreased Gait velocity interpretation: 1.31 - 2.62 ft/sec, indicative of limited community ambulator General Gait Details: cues for posture and increased bilat step lengths and cadence   Stairs             Wheelchair Mobility    Modified Rankin (Stroke Patients Only)       Balance Overall balance assessment: Needs assistance Sitting-balance support: Feet supported;No upper extremity supported Sitting balance-Leahy Scale: Fair     Standing balance support: Bilateral upper extremity supported Standing balance-Leahy Scale: Poor                              Cognition Arousal/Alertness: Awake/alert Behavior During Therapy: WFL for tasks assessed/performed Overall Cognitive Status: Within Functional Limits for tasks assessed                                        Exercises      General Comments General comments (skin integrity, edema, etc.): max A to don TLSO       Pertinent Vitals/Pain Pain Assessment: Faces Faces Pain Scale: Hurts a little bit Pain Location: back Pain Descriptors / Indicators: Discomfort Pain Intervention(s): Monitored during session;Repositioned    Home Living                      Prior Function  PT Goals (current goals can now be found in the care plan section) Progress towards PT goals: Progressing toward goals    Frequency    Min 4X/week      PT Plan Current plan remains appropriate    Co-evaluation              AM-PAC PT "6 Clicks" Daily Activity  Outcome Measure  Difficulty turning over in bed (including adjusting bedclothes, sheets and blankets)?: A Lot Difficulty moving from lying on back to sitting on the side of the bed? : A Lot Difficulty sitting down on and standing up from a  chair with arms (e.g., wheelchair, bedside commode, etc,.)?: Unable Help needed moving to and from a bed to chair (including a wheelchair)?: A Little Help needed walking in hospital room?: A Little Help needed climbing 3-5 steps with a railing? : A Lot 6 Click Score: 13    End of Session Equipment Utilized During Treatment: Gait belt;Back brace Activity Tolerance: Patient tolerated treatment well Patient left: in chair;with call bell/phone within reach Nurse Communication: Mobility status PT Visit Diagnosis: Unsteadiness on feet (R26.81);Pain;Difficulty in walking, not elsewhere classified (R26.2) Pain - Right/Left: Right Pain - part of body: (back and ribs)     Time: 9169-4503 PT Time Calculation (min) (ACUTE ONLY): 20 min  Charges:  $Gait Training: 8-22 mins                     Earney Navy, PTA Acute Rehabilitation Services Pager: [phone] Office: [phone]     Darliss Cheney 02/07/2018, 3:58 PM

## 2018-02-07 NOTE — Procedures (Signed)
S/P T6 VP 

## 2018-02-07 NOTE — Care Management Important Message (Signed)
Important Message  Patient Details  Name: Susan [name] MRN: [id number] Date of Birth: Nov 20, 1933   Medicare Important Message Given:  Yes    Orbie Pyo 02/07/2018, 4:07 PM

## 2018-02-07 NOTE — Progress Notes (Signed)
[facility] PROGRESS NOTE  Susan [name] NKN:[id number] DOB: [birth year] DOA: 01/31/2018 PCP: Crist Infante, MD  Brief summary   84 year old female with past medical history for coronary artery disease, chronic heart failure, carotid vascular disease, dyslipidemia and hypothyroidism presented with mechanical fall, back pains.  Patient sustained a mechanical fall from her own height, trauma to her chest and back, no head trauma loss of consciousness. CT of the abdomen showed T6 subacute compression fracture with about 25% loss of height of the anterior vertebral body.   Assessment/Plan:  T6 acute/ subacute compression fracture. Initially, uncontrolled pains, worse with movement. Underwent IR kyphoplasty on 02/07/18. Physical therapy evaluation, recommendation for CIR. Cont pain control, taper pain regimen as able   History of CAD. No acute cardiopulmonary symptoms. Cont home regimen  Chronic heart failure. LV dysfunction . EF 30-35% (2019). No s/s of acute heart failure. No fluid overload. Patient is low dose BB, not on ACE/ARB likely due to soft BP. Cont outpatient cardiology follow up  Dyslipidemia. Continue rosuvastatin.  Hypothyroid. On levothyroxine.  Hypertension. Will continue blood pressure control with carvedilol. Hypokalemia.  Resolved.   Acute migraine: Resolved.  Code Status: full Family Communication: d/w patient, her family, RN (indicate person spoken with, relationship, and if by phone, the number) Disposition Plan: pend rehab eval    Consultants:  IR  Procedures:  kyphoplasty   Antibiotics: Anti-infectives (From admission, onward)   Start     Dose/Rate Route Frequency Ordered Stop   02/07/18 0945  ceFAZolin (ANCEF) IVPB 2g/100 mL premix     2 g 200 mL/hr over 30 Minutes Intravenous  Once 02/07/18 0943 02/07/18 1014   02/07/18 0931  tobramycin (NEBCIN) powder      Topical As needed 02/07/18 0931 02/07/18 0931   02/07/18 0806  tobramycin (NEBCIN) 1.2 g  powder    Note to Pharmacy:  [facility]   : cabinet override      02/07/18 0806 02/07/18 2014        (indicate start date, and stop date if known)  HPI/Subjective: No distress. Reports mild back pains. No focal neuro symptoms   Objective: Vitals:   02/07/18 1045 02/07/18 1110  BP: 109/61 121/63  Pulse: 64 63  Resp: 19 17  Temp: (!) 97.4 F (36.3 C) (!) 97.4 F (36.3 C)  SpO2:      Intake/Output Summary (Last 24 hours) at 02/07/2018 1226 Last data filed at 02/07/2018 0421 Gross per 24 hour  Intake 600 ml  Output 300 ml  Net 300 ml   Filed Weights   01/31/18 2110  Weight: 57.6 kg    Exam:   General:  No distress   Cardiovascular: s1,s2 rrr  Respiratory: CTA BL  Abdomen: soft, nt   Musculoskeletal: no leg edema    Data Reviewed: Basic Metabolic Panel: Recent Labs  Lab 01/31/18 2145 01/31/18 2152 02/01/18 0245 02/02/18 0630 02/06/18 0752  NA 137 140 135 134* 134*  K 3.1* 3.1* 3.6 4.7 4.0  CL 109 107 102 102 98  CO2 20*  --  23 26 27   GLUCOSE 101* 93 128* 92 84  BUN 15 16 13 10 13   CREATININE 0.84 0.70 0.88 0.90 1.00  CALCIUM 8.3*  --  9.5 9.2 9.1  MG  --   --  2.1  --  2.2  PHOS  --   --  4.0  --  3.3   Liver Function Tests: Recent Labs  Lab 01/31/18 2145 02/01/18 0245 02/06/18 0752  AST  23 25  --   ALT 16 19  --   ALKPHOS 53 59  --   BILITOT 0.3 0.6  --   PROT 5.9* 7.1  --   ALBUMIN 3.4* 3.9 3.4*   No results for input(s): LIPASE, AMYLASE in the last 168 hours. No results for input(s): AMMONIA in the last 168 hours. CBC: Recent Labs  Lab 01/31/18 2145 01/31/18 2152 02/01/18 0245 02/06/18 0752  WBC 7.6  --  9.7 8.1  NEUTROABS 4.8  --   --  4.2  HGB 12.2 12.6 13.4 13.1  HCT 37.0 37.0 40.8 41.0  MCV 98.7  --  97.6 99.8  PLT 183  --  203 214   Cardiac Enzymes: No results for input(s): CKTOTAL, CKMB, CKMBINDEX, TROPONINI in the last 168 hours. BNP (last 3 results) No results for input(s): BNP in the last 8760  hours.  ProBNP (last 3 results) No results for input(s): PROBNP in the last 8760 hours.  CBG: No results for input(s): GLUCAP in the last 168 hours.  Recent Results (from the past 240 hour(s))  Surgical pcr screen     Status: None   Collection Time: 02/07/18  4:55 AM  Result Value Ref Range Status   MRSA, PCR NEGATIVE NEGATIVE Final   Staphylococcus aureus NEGATIVE NEGATIVE Final    Comment: (NOTE) The Xpert SA Assay (FDA approved for NASAL specimens in patients 52 years of age and older), is one component of a comprehensive surveillance program. It is not intended to diagnose infection nor to guide or monitor treatment. Performed at [facility], [place] [address], [place], [place] [address]      Studies: No results found.  Scheduled Meds: . bupivacaine      . carvedilol  3.125 mg Oral BID WC  . fentaNYL      . levothyroxine  75 mcg Oral QAC breakfast  . lidocaine  1 patch Transdermal Q24H  . midazolam      . oxyCODONE  10 mg Oral Q12H  . polyethylene glycol  17 g Oral Daily  . potassium chloride  40 mEq Oral Once  . rosuvastatin  40 mg Oral Daily  . senna  1 tablet Oral BID  . tobramycin      . venlafaxine XR  150 mg Oral Daily   Continuous Infusions: . sodium chloride 50 mL/hr at 02/07/18 1119    Active Problems:   Arteriosclerosis of coronary artery   Hypothyroidism   Hyperlipidemia   Cardiomyopathy ([place])   Hypokalemia   Closed wedge compression fracture of T6 vertebra (HCC)   Chest pain   Coronary artery disease   Closed wedge compression fracture of sixth thoracic vertebra (HCC)    Time spent: >35 minutes     Kinnie Feil  [facility] Pager [phone]. If 7PM-7AM, please contact night-coverage at [email or web address], password [facility] 02/07/2018, 12:26 PM  LOS: 4 days

## 2018-02-07 NOTE — Plan of Care (Signed)
  Problem: Pain Managment: Goal: General experience of comfort will improve Outcome: Progressing   Problem: Safety: Goal: Ability to remain free from injury will improve Outcome: Progressing   

## 2018-02-08 ENCOUNTER — Encounter (HOSPITAL_COMMUNITY): Payer: Self-pay | Admitting: Interventional Radiology

## 2018-02-08 NOTE — Progress Notes (Signed)
Physical Therapy Treatment Patient Details Name: Susan [name] MRN: [id number] DOB: 06/18/1933 Today's Date: 02/08/2018    History of Present Illness Pt is an 84 y/o female who presents s/p fall at home. Pt hit her R knee and top of her chin on the cofee table and sustained a T6 fracture. C-spine cleared. PMH significant for osteoporosis, LAD stenosis, hypothyroidism, CAD, right/left heart cath and coronary angiography 10/08/2017, lumbar laminectomy, fractured wrist repair.     PT Comments    Patient seen for mobility progression. Pt is making progress toward PT goals and tolerated session well. Continue to progress as tolerated.    Follow Up Recommendations  CIR;Supervision/Assistance - 24 hour     Equipment Recommendations  Rolling walker with 5" wheels    Recommendations for Other Services Rehab consult     Precautions / Restrictions Precautions Precautions: Fall;Back Precaution Comments: pt able to recall 3/3 precautions but does need cues to adhere to precautions while mobilizing Required Braces or Orthoses: Spinal Brace Spinal Brace: Thoracolumbosacral orthotic;Applied in sitting position Restrictions Weight Bearing Restrictions: No    Mobility  Bed Mobility Overal bed mobility: Needs Assistance Bed Mobility: Rolling;Sit to Sidelying Rolling: Min guard Sidelying to sit: Min assist     Sit to sidelying: Min assist General bed mobility comments: cues for sequencing and technique; assist to bring bilat LE into bed and to maintain precautions   Transfers Overall transfer level: Needs assistance Equipment used: Rolling walker (2 wheeled) Transfers: Sit to/from Stand Sit to Stand: Min assist         General transfer comment: cues for safe hand placement   Ambulation/Gait Ambulation/Gait assistance: Min guard Gait Distance (Feet): 150 Feet Assistive device: Rolling walker (2 wheeled) Gait Pattern/deviations: Step-through pattern;Decreased step length -  right;Decreased step length - left;Decreased dorsiflexion - right;Decreased dorsiflexion - left Gait velocity: Decreased   General Gait Details: cues for upright posture,proximity to rW, and increased bilat step lengths   Stairs             Wheelchair Mobility    Modified Rankin (Stroke Patients Only)       Balance Overall balance assessment: Needs assistance Sitting-balance support: Feet supported;No upper extremity supported Sitting balance-Leahy Scale: Good     Standing balance support: Bilateral upper extremity supported Standing balance-Leahy Scale: Poor                              Cognition Arousal/Alertness: Awake/alert Behavior During Therapy: WFL for tasks assessed/performed Overall Cognitive Status: Within Functional Limits for tasks assessed                                        Exercises      General Comments        Pertinent Vitals/Pain Pain Assessment: Faces Faces Pain Scale: Hurts a little bit Pain Location: L ribcage Pain Descriptors / Indicators: Discomfort Pain Intervention(s): Monitored during session    Home Living                      Prior Function            PT Goals (current goals can now be found in the care plan section) Acute Rehab PT Goals Patient Stated Goal: to get back to mowing her lawn Progress towards PT goals: Progressing toward goals  Frequency    Min 4X/week      PT Plan Current plan remains appropriate    Co-evaluation              AM-PAC PT "6 Clicks" Daily Activity  Outcome Measure  Difficulty turning over in bed (including adjusting bedclothes, sheets and blankets)?: Unable Difficulty moving from lying on back to sitting on the side of the bed? : Unable Difficulty sitting down on and standing up from a chair with arms (e.g., wheelchair, bedside commode, etc,.)?: Unable Help needed moving to and from a bed to chair (including a wheelchair)?: A  Little Help needed walking in hospital room?: A Little Help needed climbing 3-5 steps with a railing? : A Little 6 Click Score: 12    End of Session Equipment Utilized During Treatment: Gait belt;Back brace Activity Tolerance: Patient tolerated treatment well Patient left: in bed;with call bell/phone within reach Nurse Communication: Mobility status PT Visit Diagnosis: Unsteadiness on feet (R26.81);Pain;Difficulty in walking, not elsewhere classified (R26.2) Pain - Right/Left: Right Pain - part of body: (back)     Time: 7517-0017 PT Time Calculation (min) (ACUTE ONLY): 28 min  Charges:  $Gait Training: 8-22 mins $Therapeutic Activity: 8-22 mins                     Earney Navy, PTA Acute Rehabilitation Services Pager: [phone] Office: [phone]     Darliss Cheney 02/08/2018, 5:29 PM

## 2018-02-08 NOTE — Progress Notes (Signed)
Occupational Therapy Treatment Patient Details Name: Susan [name] MRN: [id number] DOB: 07-Oct-1933 Today's Date: 02/08/2018    History of present illness Pt is an 84 y/o female who presents s/p fall at home. Pt hit her R knee and top of her chin on the cofee table and sustained a T6 fracture. C-spine cleared. PMH significant for osteoporosis, LAD stenosis, hypothyroidism, CAD, right/left heart cath and coronary angiography 10/08/2017, lumbar laminectomy, fractured wrist repair.    OT comments  Pt presents supine in bed pleasant and eager to participate in therapy this afternoon. Pt performing bed mobility and room level functional mobility using RW with overall minA. Pt currently requiring maxA for brace management; initiated education regarding use of AE for completing LB ADLs with pt return demonstrating given mod cues for technique. Pt with good recall of 3/3 back precautions, though does require intermittent cues to maintain back precautions during functional tasks. Continue to recommend CIR stay at time of discharge as feel she will benefit from intensive therapies to progress pt towards PLOF prior to discharge home. Will continue to follow acutely to progress pt towards established OT goals.    Follow Up Recommendations  CIR;Supervision/Assistance - 24 hour    Equipment Recommendations  Other (comment)(TBD)          Precautions / Restrictions Precautions Precautions: Fall;Back Precaution Comments: pt able to recall 3/3 precautions but does need cues to adhere to precautions while mobilizing Required Braces or Orthoses: Spinal Brace Restrictions Weight Bearing Restrictions: No       Mobility Bed Mobility Overal bed mobility: Needs Assistance Bed Mobility: Sidelying to Sit;Rolling;Sit to Sidelying Rolling: Min assist Sidelying to sit: Min assist     Sit to sidelying: Min assist General bed mobility comments: reviewed log roll techniques and pt practicing with minA to ensure  proper technique; practiced rolling to L/R, sit>sidelying and sidelying to sitting EOB  Transfers Overall transfer level: Needs assistance Equipment used: Rolling walker (2 wheeled) Transfers: Sit to/from Stand Sit to Stand: Min assist         General transfer comment: assist to power up into standing; VCs safe hand placement    Balance Overall balance assessment: Needs assistance Sitting-balance support: Feet supported;No upper extremity supported Sitting balance-Leahy Scale: Fair     Standing balance support: Bilateral upper extremity supported Standing balance-Leahy Scale: Poor                             ADL either performed or assessed with clinical judgement   ADL Overall ADL's : Needs assistance/impaired                 Upper Body Dressing : Maximal assistance;Sitting Upper Body Dressing Details (indicate cue type and reason): donning TLSO seated EOB; use of additional padding along edges of brace underneath breasts for increased comfort due to bruising   Lower Body Dressing Details (indicate cue type and reason): educated on use of AE for LB dressing including sock aide and reacher; pt return demonstrating understanding with mod cues              Functional mobility during ADLs: Minimal assistance;Rolling walker General ADL Comments: pt with increased questions regarding brace management, bed mobility and back precautions; education provided throughout session; pt motivated to return to PLOF                       Cognition Arousal/Alertness: Awake/alert Behavior During Therapy:  WFL for tasks assessed/performed Overall Cognitive Status: Within Functional Limits for tasks assessed                                          Exercises     Shoulder Instructions       General Comments      Pertinent Vitals/ Pain       Pain Assessment: Faces Faces Pain Scale: Hurts a little bit Pain Location: back; L ribcage Pain  Descriptors / Indicators: Discomfort Pain Intervention(s): Monitored during session;Repositioned  Home Living                                          Prior Functioning/Environment              Frequency  Min 2X/week        Progress Toward Goals  OT Goals(current goals can now be found in the care plan section)  Progress towards OT goals: Progressing toward goals  Acute Rehab OT Goals Patient Stated Goal: to get back to mowing her lawn OT Goal Formulation: With patient Time For Goal Achievement: 02/15/18 Potential to Achieve Goals: Good ADL Goals Pt Will Perform Upper Body Dressing: with modified independence;sitting Pt Will Perform Lower Body Dressing: with modified independence;sit to/from stand;with adaptive equipment Pt Will Transfer to Toilet: with modified independence;ambulating;bedside commode Pt Will Perform Toileting - Clothing Manipulation and hygiene: with modified independence;sit to/from stand;with adaptive equipment Additional ADL Goal #1: Pt will perform bed mobility with supervision using log roll technique in preparation for ADLs. Additional ADL Goal #2: Pt will verbalize 3/3 back precautions independently  Plan Discharge plan remains appropriate;Frequency remains appropriate    Co-evaluation                 AM-PAC PT "6 Clicks" Daily Activity     Outcome Measure   Help from another person eating meals?: None Help from another person taking care of personal grooming?: A Little Help from another person toileting, which includes using toliet, bedpan, or urinal?: A Little Help from another person bathing (including washing, rinsing, drying)?: A Lot Help from another person to put on and taking off regular upper body clothing?: A Lot Help from another person to put on and taking off regular lower body clothing?: A Lot 6 Click Score: 16    End of Session Equipment Utilized During Treatment: Gait belt;Rolling walker;Back  brace  OT Visit Diagnosis: Unsteadiness on feet (R26.81);Other abnormalities of gait and mobility (R26.89);Muscle weakness (generalized) (M62.81);Other symptoms and signs involving cognitive function;Pain Pain - part of body: (back)   Activity Tolerance Patient tolerated treatment well   Patient Left in chair;with call bell/phone within reach;with family/visitor present   Nurse Communication Mobility status        Time: 1333-1410 OT Time Calculation (min): 37 min  Charges: OT General Charges $OT Visit: 1 Visit OT Treatments $Self Care/Home Management : 23-37 mins  Lou Cal, OT Supplemental Rehabilitation Services Pager [phone] Office [phone]    Raymondo Band 02/08/2018, 2:24 PM

## 2018-02-08 NOTE — Progress Notes (Addendum)
Inpatient Rehabilitation  I await updated OT session information to assist with determining post acute rehab needs. Patient might be too high level for CIR. Plan to follow up with team as I know. Call if questions.   Carmelia Roller., CCC/SLP Admission Coordinator  [place]  Cell [phone]

## 2018-02-08 NOTE — Progress Notes (Signed)
[facility] PROGRESS NOTE  Susan [name] TZG:[id number] DOB: [birth year] DOA: 01/31/2018 PCP: Crist Infante, MD  Brief summary   84 year old female with past medical history for coronary artery disease, chronic heart failure, carotid vascular disease, dyslipidemia and hypothyroidism presented with mechanical fall, back pains.  Patient sustained a mechanical fall from her own height, trauma to her chest and back, no head trauma loss of consciousness. CT of the abdomen showed T6 subacute compression fracture with about 25% loss of height of the anterior vertebral body.   Assessment/Plan:  T6 acute/ subacute compression fracture. Initially, uncontrolled pains, worse with movement. Underwent IR kyphoplasty on 02/07/18. Physical therapy evaluation, recommendation for CIR. Cont pain control, taper pain regimen as able   History of CAD. No acute cardiopulmonary symptoms. Cont home regimen  Chronic heart failure. LV dysfunction . EF 30-35% (2019). No s/s of acute heart failure. No fluid overload. Patient is low dose BB, not on ACE/ARB likely due to soft BP. Cont outpatient cardiology follow up  Dyslipidemia. Continue rosuvastatin.  Hypothyroid. On levothyroxine.  Hypertension. Will continue blood pressure control with carvedilol. Hypokalemia.  Resolved.   Acute migraine: Resolved.  Code Status: full Family Communication: d/w patient, her family, RN (indicate person spoken with, relationship, and if by phone, the number) Disposition Plan: awaiting rehab eval    Consultants:  IR  Procedures:  kyphoplasty   Antibiotics: Anti-infectives (From admission, onward)   Start     Dose/Rate Route Frequency Ordered Stop   02/07/18 0945  ceFAZolin (ANCEF) IVPB 2g/100 mL premix     2 g 200 mL/hr over 30 Minutes Intravenous  Once 02/07/18 0943 02/07/18 1014   02/07/18 0931  tobramycin (NEBCIN) powder      Topical As needed 02/07/18 0931 02/07/18 0931   02/07/18 0806  tobramycin (NEBCIN) 1.2 g  powder    Note to Pharmacy:  [facility]   : cabinet override      02/07/18 0806 02/07/18 2014       (indicate start date, and stop date if known)  HPI/Subjective: No acute issues overnight. No distress. Reports mild back pains. No focal neuro symptoms   Objective: Vitals:   02/08/18 0502 02/08/18 0819  BP: 108/62 108/62  Pulse:  64  Resp:    Temp:    SpO2:      Intake/Output Summary (Last 24 hours) at 02/08/2018 0823 Last data filed at 02/08/2018 0502 Gross per 24 hour  Intake 120 ml  Output 400 ml  Net -280 ml   Filed Weights   01/31/18 2110  Weight: 57.6 kg    Exam:   General:  No distress   Cardiovascular: s1,s2 rrr  Respiratory: CTA BL  Abdomen: soft, nt   Musculoskeletal: no leg edema    Data Reviewed: Basic Metabolic Panel: Recent Labs  Lab 02/02/18 0630 02/06/18 0752  NA 134* 134*  K 4.7 4.0  CL 102 98  CO2 26 27  GLUCOSE 92 84  BUN 10 13  CREATININE 0.90 1.00  CALCIUM 9.2 9.1  MG  --  2.2  PHOS  --  3.3   Liver Function Tests: Recent Labs  Lab 02/06/18 0752  ALBUMIN 3.4*   No results for input(s): LIPASE, AMYLASE in the last 168 hours. No results for input(s): AMMONIA in the last 168 hours. CBC: Recent Labs  Lab 02/06/18 0752  WBC 8.1  NEUTROABS 4.2  HGB 13.1  HCT 41.0  MCV 99.8  PLT 214   Cardiac Enzymes: No results for input(s):  CKTOTAL, CKMB, CKMBINDEX, TROPONINI in the last 168 hours. BNP (last 3 results) No results for input(s): BNP in the last 8760 hours.  ProBNP (last 3 results) No results for input(s): PROBNP in the last 8760 hours.  CBG: No results for input(s): GLUCAP in the last 168 hours.  Recent Results (from the past 240 hour(s))  Surgical pcr screen     Status: None   Collection Time: 02/07/18  4:55 AM  Result Value Ref Range Status   MRSA, PCR NEGATIVE NEGATIVE Final   Staphylococcus aureus NEGATIVE NEGATIVE Final    Comment: (NOTE) The Xpert SA Assay (FDA approved for NASAL specimens in  patients 44 years of age and older), is one component of a comprehensive surveillance program. It is not intended to diagnose infection nor to guide or monitor treatment. Performed at [facility], [place] [address], [place],  [address]      Studies: No results found.  Scheduled Meds: . carvedilol  3.125 mg Oral BID WC  . levothyroxine  75 mcg Oral QAC breakfast  . lidocaine  1 patch Transdermal Q24H  . oxyCODONE  10 mg Oral Q12H  . polyethylene glycol  17 g Oral Daily  . potassium chloride  40 mEq Oral Once  . rosuvastatin  40 mg Oral Daily  . senna  1 tablet Oral BID  . venlafaxine XR  150 mg Oral Daily   Continuous Infusions:   Active Problems:   Arteriosclerosis of coronary artery   Hypothyroidism   Hyperlipidemia   Cardiomyopathy ([place])   Hypokalemia   Closed wedge compression fracture of T6 vertebra (HCC)   Chest pain   Coronary artery disease   Closed wedge compression fracture of sixth thoracic vertebra (HCC)    Time spent: >35 minutes     Kinnie Feil  [facility] Pager [phone]. If 7PM-7AM, please contact night-coverage at [email or web address], password [facility] 02/08/2018, 8:23 AM  LOS: 5 days

## 2018-02-08 NOTE — Progress Notes (Signed)
Inpatient Rehabilitation  Note updated PT notes.  I have initiated request for authorization with [employer].  Plan to follow up with the patient, son, and team tomorrow.  Call if questions.   Carmelia Roller., CCC/SLP Admission Coordinator  [place]  Cell [phone]

## 2018-02-09 NOTE — Progress Notes (Addendum)
Inpatient Rehabilitation  Met with patient at bedside to discuss team's recommendation for IP Rehab.  Shared booklets, insurance verification letter, and answered questions.  Patient stated that she didn't want to pay co-pays for rehab and it was her understanding that rehab was covered by her medicare.  Informed her that her managed [facility] has co-pays for Inpatient Rehab; however, if she was interested in the no co-pay rehab that would be outside the hospital at a SNF.  She requested to talk with the CSW and I notified her as well as case Freight forwarder.  Call if questions.    Update: I have received a voicemail from son, Cletus Gash requesting an update regarding his mom's rehab.  I have returned his call and left a voicemail and await a return call.    Carmelia Roller., CCC/SLP Admission Coordinator  [place]  Cell [phone]

## 2018-02-09 NOTE — Progress Notes (Signed)
Physical Therapy Treatment Patient Details Name: Susan [name] MRN: [id number] DOB: 1934/01/04 Today's Date: 02/09/2018    History of Present Illness Pt is an 84 y/o female who presents s/p fall at home. Pt hit her R knee and top of her chin on the cofee table and sustained a T6 fracture. C-spine cleared. PMH significant for osteoporosis, LAD stenosis, hypothyroidism, CAD, right/left heart cath and coronary angiography 10/08/2017, lumbar laminectomy, fractured wrist repair.     PT Comments    Patient seen for mobility progression. This session focused on gait and bed mobility. Pt continues to require min A for bed mobility and min guard for gait. Pt will continue to benefit from further skilled PT services both acute and post acute in order to at least reach mod I level prior to return home alone with intermittent supervision.   Follow Up Recommendations  Supervision/Assistance - 24 hour;CIR     Equipment Recommendations  Rolling walker with 5" wheels    Recommendations for Other Services       Precautions / Restrictions Precautions Precautions: Fall;Back Precaution Comments: pt able to recall 3/3 precautions but does need cues to adhere to precautions while mobilizing Required Braces or Orthoses: Spinal Brace Spinal Brace: Thoracolumbosacral orthotic;Applied in sitting position    Mobility  Bed Mobility Overal bed mobility: Needs Assistance Bed Mobility: Rolling;Sit to Sidelying;Sidelying to Sit Rolling: Min guard Sidelying to sit: Min assist     Sit to sidelying: Min assist General bed mobility comments: cues for sequencing; assist to maintain precautions and to bring bilat LE into bed   Transfers Overall transfer level: Needs assistance Equipment used: Rolling walker (2 wheeled) Transfers: Sit to/from Stand Sit to Stand: Min guard         General transfer comment: cues for safe hand placement; min guard for safety  Ambulation/Gait Ambulation/Gait assistance: Min  guard;Supervision Gait Distance (Feet): (hallway ambulation) Assistive device: Rolling walker (2 wheeled) Gait Pattern/deviations: Step-through pattern;Decreased step length - right;Decreased step length - left;Decreased dorsiflexion - right;Decreased dorsiflexion - left;Shuffle Gait velocity: Decreased   General Gait Details: cues for increased step lengths and cadence    Stairs             Wheelchair Mobility    Modified Rankin (Stroke Patients Only)       Balance Overall balance assessment: Needs assistance Sitting-balance support: Feet supported;No upper extremity supported Sitting balance-Leahy Scale: Good     Standing balance support: Bilateral upper extremity supported Standing balance-Leahy Scale: Poor                              Cognition Arousal/Alertness: Awake/alert Behavior During Therapy: WFL for tasks assessed/performed Overall Cognitive Status: Within Functional Limits for tasks assessed                                        Exercises      General Comments        Pertinent Vitals/Pain Pain Assessment: Faces Faces Pain Scale: Hurts little more Pain Location: L ribcage and knees  Pain Descriptors / Indicators: Sore Pain Intervention(s): Limited activity within patient's tolerance;Monitored during session;Repositioned    Home Living                      Prior Function  PT Goals (current goals can now be found in the care plan section) Progress towards PT goals: Progressing toward goals    Frequency    Min 4X/week      PT Plan Current plan remains appropriate    Co-evaluation              AM-PAC PT "6 Clicks" Daily Activity  Outcome Measure  Difficulty turning over in bed (including adjusting bedclothes, sheets and blankets)?: Unable Difficulty moving from lying on back to sitting on the side of the bed? : Unable Difficulty sitting down on and standing up from a chair  with arms (e.g., wheelchair, bedside commode, etc,.)?: Unable Help needed moving to and from a bed to chair (including a wheelchair)?: A Little Help needed walking in hospital room?: A Little Help needed climbing 3-5 steps with a railing? : A Little 6 Click Score: 12    End of Session Equipment Utilized During Treatment: Gait belt;Back brace Activity Tolerance: Patient tolerated treatment well Patient left: with call bell/phone within reach;in chair Nurse Communication: Mobility status PT Visit Diagnosis: Unsteadiness on feet (R26.81);Pain;Difficulty in walking, not elsewhere classified (R26.2) Pain - Right/Left: Right Pain - part of body: (back)     Time: 1310-1345 PT Time Calculation (min) (ACUTE ONLY): 35 min  Charges:  $Gait Training: 8-22 mins $Therapeutic Activity: 8-22 mins                     Earney Navy, PTA Acute Rehabilitation Services Pager: [phone] Office: [phone]     Darliss Cheney 02/09/2018, 3:44 PM

## 2018-02-09 NOTE — Clinical Social Work Note (Signed)
Clinical Social Work Assessment  Patient Details  Name: Susan [name] MRN: [id number] Date of Birth: [birth year]  Date of referral:  02/09/18               Reason for consult:  Discharge Planning                Permission sought to share information with:  Case Manager, Facility Sport and exercise psychologist, Family Supports Permission granted to share information::  Yes, Verbal Permission Granted  Name::     Cletus Gash  Agency::  SNFs  Relationship::  son  Contact Information:  [phone]  Housing/Transportation Living arrangements for the past 2 months:  [place] of Information:    Patient Interpreter Needed:  None Criminal Activity/Legal Involvement Pertinent to Current Situation/Hospitalization:  No - Comment as needed Significant Relationships:  Adult Children Lives with:  Self Do you feel safe going back to the place where you live?  No Need for family participation in patient care:  Yes (Comment)  Care giving concerns:  CSW received referral for possible SNF placement at time of discharge. Spoke with patient regarding possibility of SNF placement . Patient's  children  are currently unable to care for her at their home given patient's current needs and fall risk.  Patient and   Son expressed understanding of PT recommendation and are agreeable to SNF placement at time of discharge. CSW to continue to follow and assist with discharge planning needs.     Social Worker assessment / plan:  Spoke with patient and son  concerning possibility of rehab at [facility] before returning home.    Employment status:  Retired Nurse, adult PT Recommendations:  [place] / Referral to community resources:  [place]  Patient/Family's Response to care:  Patient and  Son  recognize need for rehab before returning home and are agreeable to a SNF in [place]. They report preference for   [facility] . CSW  explained insurance authorization process. Patient's family reported that they want patient to get stronger to be able to come back home.    Patient/Family's Understanding of and Emotional Response to Diagnosis, Current Treatment, and Prognosis:  Patient/family is realistic regarding therapy needs and expressed being hopeful for SNF placement. Patient expressed understanding of CSW role and discharge process as well as medical condition. No questions/concerns about plan or treatment.    Emotional Assessment Appearance:  Appears stated age Attitude/Demeanor/Rapport:  Engaged, Gracious Affect (typically observed):  Accepting, Adaptable Orientation:  Oriented to Self, Oriented to Place, Oriented to  Time, Oriented to Situation Alcohol / Substance use:  Not Applicable Psych involvement (Current and /or in the community):  No (Comment)  Discharge Needs  Concerns to be addressed:  Discharge Planning Concerns Readmission within the last 30 days:  No Current discharge risk:  Dependent with Mobility Barriers to Discharge:  Continued Medical Work up   [employer], LCSW 02/09/2018, 3:44 PM

## 2018-02-09 NOTE — NC FL2 (Signed)
[place] LEVEL OF CARE SCREENING TOOL     IDENTIFICATION  Patient Name: Susan [name] Birthdate: 04/11/1934 Sex: female Admission Date (Current Location): 01/31/2018  [facility] and [place] Number:  Herbalist and Address:  [facility] [place], [place] [address] [place] [employer], [place] [address]      Provider Number: [id number]  Attending Physician Name and Address:  Kinnie Feil, MD  Relative Name and Phone Number:  Cletus Gash (son) [phone]    Current Level of Care: Hospital Recommended Level of Care: [place] Prior Approval Number:    Date Approved/Denied: 02/09/18 PASRR Number: [id number]  Discharge Plan: SNF    Current Diagnoses: Patient Active Problem List   Diagnosis Date Noted  . Closed wedge compression fracture of sixth thoracic vertebra ([place]) 02/03/2018  . Hypokalemia 02/01/2018  . Closed wedge compression fracture of T6 vertebra ([place]) 02/01/2018  . Chest pain 02/01/2018  . Coronary artery disease   . Dyspnea on exertion 10/08/2017  . Cardiomyopathy ([place]) 10/08/2017  . Posterior vitreous detachment of both eyes 04/28/2016  . Hyperlipidemia 07/18/2014  . Nonexudative age-related macular degeneration 07/17/2014  . Status post intraocular lens implant 07/17/2014  . Pseudophakia 10/25/2013  . Anxiety 10/11/2013  . Mechanical complication of intraocular lens 08/12/2012  . Nuclear sclerosis 08/12/2012  . Posterior capsule opacification, right 08/12/2012  . Macular degeneration 08/04/2011  . Pituitary adenoma ([place]) 07/15/2011  . Dehydration 07/14/2011  . Pre-syncope 07/14/2011  . Arteriosclerosis of coronary artery   . Hypothyroidism     Orientation RESPIRATION BLADDER Height & Weight     Self, Time, Situation, Place  Normal Continent Weight: 127 lb (57.6 kg) Height:  5\' 3"  (160 cm)  BEHAVIORAL SYMPTOMS/MOOD NEUROLOGICAL BOWEL NUTRITION STATUS      Continent Diet(see discharge summary)  AMBULATORY  STATUS COMMUNICATION OF NEEDS Skin   Extensive Assist Verbally Skin abrasions(right leg knee abrasions)                       Personal Care Assistance Level of Assistance  Bathing, Feeding, Dressing, Total care Bathing Assistance: Limited assistance Feeding assistance: Independent Dressing Assistance: Limited assistance Total Care Assistance: Limited assistance   Functional Limitations Info  Sight, Hearing, Speech Sight Info: Adequate Hearing Info: Adequate Speech Info: Adequate    SPECIAL CARE FACTORS FREQUENCY  PT (By licensed PT), OT (By licensed OT)     PT Frequency: 5x weekly OT Frequency: 5x weekly            Contractures Contractures Info: Not present    Additional Factors Info                  Current Medications (02/09/2018):  This is the current hospital active medication list Current Facility-Administered Medications  Medication Dose Route Frequency Provider Last Rate Last Dose  . acetaminophen (TYLENOL) tablet 650 mg  650 mg Oral Q6H PRN Toy Baker, MD   650 mg at 02/06/18 2114   Or  . acetaminophen (TYLENOL) suppository 650 mg  650 mg Rectal Q6H PRN Toy Baker, MD      . alum & mag hydroxide-simeth (MAALOX/MYLANTA) 200-200-20 MG/5ML suspension 30 mL  30 mL Oral Q6H PRN Arrien, Jimmy Picket, MD   30 mL at 02/04/18 0212  . bisacodyl (DULCOLAX) suppository 10 mg  10 mg Rectal Daily PRN Toy Baker, MD   10 mg at 02/07/18 1840  . carvedilol (COREG) tablet 3.125 mg  3.125 mg Oral BID WC  Toy Baker, MD   3.125 mg at 02/09/18 1016  . HYDROmorphone (DILAUDID) injection 0.5 mg  0.5 mg Intravenous Q4H PRN Toy Baker, MD   0.5 mg at 02/05/18 2320  . levothyroxine (SYNTHROID, LEVOTHROID) tablet 75 mcg  75 mcg Oral QAC breakfast Toy Baker, MD   75 mcg at 02/09/18 0800  . lidocaine (LIDODERM) 5 % 1 patch  1 patch Transdermal Q24H Dana Allan I, MD   1 patch at 02/09/18 1240  . methocarbamol (ROBAXIN)  tablet 750 mg  750 mg Oral Q8H PRN Toy Baker, MD   750 mg at 02/04/18 1305  . ondansetron (ZOFRAN) tablet 4 mg  4 mg Oral Q6H PRN Doutova, Anastassia, MD       Or  . ondansetron (ZOFRAN) injection 4 mg  4 mg Intravenous Q6H PRN Doutova, Anastassia, MD   4 mg at 02/04/18 1012  . oxyCODONE (OXYCONTIN) 12 hr tablet 10 mg  10 mg Oral Q12H Buriev, Arie Sabina, MD   10 mg at 02/09/18 1018  . polyethylene glycol (MIRALAX / GLYCOLAX) packet 17 g  17 g Oral Daily Dana Allan I, MD   17 g at 02/08/18 1037  . potassium chloride SA (K-DUR,KLOR-CON) CR tablet 40 mEq  40 mEq Oral Once Toy Baker, MD   Stopped at 02/01/18 0135  . rosuvastatin (CRESTOR) tablet 40 mg  40 mg Oral Daily Doutova, Anastassia, MD   40 mg at 02/09/18 1017  . senna (SENOKOT) tablet 8.6 mg  1 tablet Oral BID Doutova, Anastassia, MD   8.6 mg at 02/09/18 1018  . SUMAtriptan (IMITREX) tablet 25 mg  25 mg Oral Q4H PRN Dana Allan I, MD      . venlafaxine XR (EFFEXOR-XR) 24 hr capsule 150 mg  150 mg Oral Daily Doutova, Anastassia, MD   150 mg at 02/09/18 1018     Discharge Medications: Please see discharge summary for a list of discharge medications.  Relevant Imaging Results:  Relevant Lab Results:   Additional Information SSN: [id number]  Alberteen Sam, LCSW

## 2018-02-09 NOTE — Progress Notes (Signed)
[facility] PROGRESS NOTE  Susan [name] WUJ:[id number] DOB: 09/15/1933 DOA: 01/31/2018 PCP: Crist Infante, MD  Brief summary   83 year old female with past medical history for coronary artery disease, chronic heart failure, carotid vascular disease, dyslipidemia and hypothyroidism presented with mechanical fall, back pains.  Patient sustained a mechanical fall from her own height, trauma to her chest and back, no head trauma loss of consciousness. CT of the abdomen showed T6 subacute compression fracture with about 25% loss of height of the anterior vertebral body.   Assessment/Plan:  T6 acute/ subacute compression fracture. Initially, uncontrolled pains, worse with movement. Underwent IR kyphoplasty on 02/07/18. Physical therapy evaluation, recommendation for CIR. Cont pain control, taper pain regimen as able   History of CAD. No acute cardiopulmonary symptoms. Cont home regimen  Chronic heart failure. LV dysfunction . EF 30-35% (2019). No s/s of acute heart failure. No fluid overload. Patient is low dose BB, not on ACE/ARB likely due to soft BP. Cont outpatient cardiology follow up  Dyslipidemia. Continue rosuvastatin.  Hypothyroid. On levothyroxine.  Hypertension. Monitor on low dose carvedilol. Hypokalemia.  Resolved.   Acute migraine: Resolved.  Code Status: full Family Communication: d/w patient, her family, RN (indicate person spoken with, relationship, and if by phone, the number) Disposition Plan: awaiting rehab eval, insurance approval    Consultants:  IR  Procedures:  kyphoplasty   Antibiotics: Anti-infectives (From admission, onward)   Start     Dose/Rate Route Frequency Ordered Stop   02/07/18 0945  ceFAZolin (ANCEF) IVPB 2g/100 mL premix     2 g 200 mL/hr over 30 Minutes Intravenous  Once 02/07/18 0943 02/07/18 1014   02/07/18 0931  tobramycin (NEBCIN) powder      Topical As needed 02/07/18 0931 02/07/18 0931   02/07/18 0806  tobramycin (NEBCIN) 1.2 g  powder    Note to Pharmacy:  [facility]   : cabinet override      02/07/18 0806 02/07/18 2014       (indicate start date, and stop date if known)  HPI/Subjective: Awaiting insurance approval./ No acute issues overnight. No distress. Reports mild back pains. No focal neuro symptoms   Objective: Vitals:   02/09/18 0523 02/09/18 0835  BP: (!) 110/52 (!) 99/59  Pulse: 64 73  Resp: 20 20  Temp: 98.4 F (36.9 C) 98.2 F (36.8 C)  SpO2: 98% 98%   No intake or output data in the 24 hours ending 02/09/18 0850 Filed Weights   01/31/18 2110  Weight: 57.6 kg    Exam:   General:  No distress   Cardiovascular: s1,s2 rrr  Respiratory: CTA BL  Abdomen: soft, nt   Musculoskeletal: no leg edema    Data Reviewed: Basic Metabolic Panel: Recent Labs  Lab 02/06/18 0752  NA 134*  K 4.0  CL 98  CO2 27  GLUCOSE 84  BUN 13  CREATININE 1.00  CALCIUM 9.1  MG 2.2  PHOS 3.3   Liver Function Tests: Recent Labs  Lab 02/06/18 0752  ALBUMIN 3.4*   No results for input(s): LIPASE, AMYLASE in the last 168 hours. No results for input(s): AMMONIA in the last 168 hours. CBC: Recent Labs  Lab 02/06/18 0752  WBC 8.1  NEUTROABS 4.2  HGB 13.1  HCT 41.0  MCV 99.8  PLT 214   Cardiac Enzymes: No results for input(s): CKTOTAL, CKMB, CKMBINDEX, TROPONINI in the last 168 hours. BNP (last 3 results) No results for input(s): BNP in the last 8760 hours.  ProBNP (last  3 results) No results for input(s): PROBNP in the last 8760 hours.  CBG: No results for input(s): GLUCAP in the last 168 hours.  Recent Results (from the past 240 hour(s))  Surgical pcr screen     Status: None   Collection Time: 02/07/18  4:55 AM  Result Value Ref Range Status   MRSA, PCR NEGATIVE NEGATIVE Final   Staphylococcus aureus NEGATIVE NEGATIVE Final    Comment: (NOTE) The Xpert SA Assay (FDA approved for NASAL specimens in patients 68 years of age and older), is one component of a  comprehensive surveillance program. It is not intended to diagnose infection nor to guide or monitor treatment. Performed at [facility], [place] [address], [place], [place] [address]      Studies: Ir Vertebroplasty [employer]/inject/imaging  Result Date: 02/08/2018 INDICATION: Severe midthoracic pain secondary to compression fracture at T6. EXAM: VERTEBROPLASTY AT T6 MEDICATIONS: As antibiotic prophylaxis, Ancef 2 g IV was ordered pre-procedure and administered intravenously within 1 hour of incision. ANESTHESIA/SEDATION: Moderate (conscious) sedation was employed during this procedure. A total of Versed 1 mg and Fentanyl 25 mcg was administered intravenously. Moderate Sedation Time: 25 minutes. The patient's level of consciousness and vital signs were monitored continuously by radiology nursing throughout the procedure under my direct supervision. FLUOROSCOPY TIME:  Fluoroscopy Time: 9 minutes 42 seconds (297 mGy) COMPLICATIONS: None immediate. TECHNIQUE: Informed written consent was obtained from the patient after a thorough discussion of the procedural risks, benefits and alternatives. All questions were addressed. Maximal Sterile Barrier Technique was utilized including caps, mask, sterile gowns, sterile gloves, sterile drape, hand hygiene and skin antiseptic. A timeout was performed prior to the initiation of the procedure. PROCEDURE: The patient was placed prone on the fluoroscopic table. Nasal oxygen was administered. Physiologic monitoring was performed throughout the duration of the procedure. The skin overlying the thoracic region was prepped and draped in the usual sterile fashion. The T6 vertebral body was identified and the right and left pedicles were infiltrated with 0.25% Bupivacaine. This was then followed by the advancement of a 13-gauge Cook needles through the pedicles into the anterior one-third at T6. A gentle contrast injection demonstrated a trabecular  pattern of contrast. At this time, methylmethacrylate mixture was reconstituted. Under biplane intermittent fluoroscopy, the methylmethacrylate was then injected into the T6 vertebral body with filling of the vertebral body. No extravasation was noted into the disk spaces or posteriorly into the spinal canal. No epidural venous contamination was seen. The needle was then removed. Hemostasis was achieved at the skin entry sites. There were no acute complications. Patient tolerated the procedure well. The patient was returned to the floor in good condition. IMPRESSION: 1. Status post vertebral body augmentation for painful compression fracture at T6 using vertebroplasty technique. Electronically Signed   By: Luanne Bras M.D.   On: 02/07/2018 10:47    Scheduled Meds: . carvedilol  3.125 mg Oral BID WC  . levothyroxine  75 mcg Oral QAC breakfast  . lidocaine  1 patch Transdermal Q24H  . oxyCODONE  10 mg Oral Q12H  . polyethylene glycol  17 g Oral Daily  . potassium chloride  40 mEq Oral Once  . rosuvastatin  40 mg Oral Daily  . senna  1 tablet Oral BID  . venlafaxine XR  150 mg Oral Daily   Continuous Infusions:   Active Problems:   Arteriosclerosis of coronary artery   Hypothyroidism   Hyperlipidemia   Cardiomyopathy ([place])   Hypokalemia  Closed wedge compression fracture of T6 vertebra ([facility])   Chest pain   Coronary artery disease   Closed wedge compression fracture of sixth thoracic vertebra ([facility])    Time spent: >35 minutes     Kinnie Feil  [facility] Pager [phone]. If 7PM-7AM, please contact night-coverage at [email or web address], password [facility] 02/09/2018, 8:50 AM  LOS: 6 days

## 2018-02-09 NOTE — Progress Notes (Signed)
Inpatient Rehabilitation  Received return call from son, Susan [name].  Discussed anticipated length of stay, estimated outcomes, and co-pays.  He is supportive of whatever his mom decides.  He is aware of her request to explore SNF given no co-pays and potential longer length of stay.  Notified CSW that he would like a call.    Carmelia Roller., CCC/SLP Admission Coordinator  [place]  Cell [phone]

## 2018-02-09 NOTE — Plan of Care (Signed)

## 2018-02-09 NOTE — Plan of Care (Signed)
  Problem: Education: Goal: Knowledge of General Education information will improve Description: Including pain rating scale, medication(s)/side effects and non-pharmacologic comfort measures Outcome: Progressing   Problem: Health Behavior/Discharge Planning: Goal: Ability to manage health-related needs will improve Outcome: Progressing   Problem: Clinical Measurements: Goal: Ability to maintain clinical measurements within normal limits will improve Outcome: Progressing Goal: Will remain free from infection Outcome: Progressing Goal: Diagnostic test results will improve Outcome: Progressing Goal: Respiratory complications will improve Outcome: Progressing   Problem: Nutrition: Goal: Adequate nutrition will be maintained Outcome: Progressing   

## 2018-02-09 NOTE — Progress Notes (Signed)
Inpatient Rehabilitation  Received update from [place] that patient provided her with 2 SNF choices.  Will close case and sign off at this time.  Call if questions.   Carmelia Roller., CCC/SLP Admission Coordinator  [place]  Cell [phone]

## 2018-02-10 MED ORDER — OXYCODONE HCL 5 MG PO TABS: 2.5000 mg | ORAL_TABLET | Freq: Four times a day (QID) | ORAL | Status: DC | PRN

## 2018-02-10 NOTE — Progress Notes (Signed)
Physical Therapy Treatment Patient Details Name: Susan [name] MRN: [id number] DOB: 31-Oct-1933 Today's Date: 02/10/2018    History of Present Illness Pt is an 84 y/o female who presents s/p fall at home. Pt hit her R knee and top of her chin on the cofee table and sustained a T6 fracture. C-spine cleared. PMH significant for osteoporosis, LAD stenosis, hypothyroidism, CAD, right/left heart cath and coronary angiography 10/08/2017, lumbar laminectomy, fractured wrist repair.     PT Comments     pt is progressing toward PT goals; increasing tolerance to activity becoming less reliant on RW; continues to require cues for adhering to back precautions during mobility; will follow and progress PT POC  Follow Up Recommendations  Supervision/Assistance - 24 hour;CIR     Equipment Recommendations  Rolling walker with 5" wheels    Recommendations for Other Services       Precautions / Restrictions Precautions Precautions: Fall;Back Precaution Comments: pt able to recall 3/3 precautions but does need cues to adhere to precautions while mobilizing Required Braces or Orthoses: Spinal Brace Spinal Brace: Thoracolumbosacral orthotic;Applied in sitting position Restrictions Weight Bearing Restrictions: No Other Position/Activity Restrictions: pt in bed on arrival; educated pt on correct technique and encouraged her to correct staff/family  should they don it incorrectly    Mobility  Bed Mobility Overal bed mobility: Needs Assistance Bed Mobility: Rolling;Sidelying to Sit Rolling: Supervision Sidelying to sit: Min guard;Min assist       General bed mobility comments: verbal cues for sequencing and technique, tactile cues for performing correctly (pt likes to come to long sit in bed)  Transfers Overall transfer level: Needs assistance Equipment used: Rolling walker (2 wheeled) Transfers: Sit to/from Stand Sit to Stand: Min guard         General transfer comment: cues for safe hand  placement; min guard for safety  Ambulation/Gait Ambulation/Gait assistance: Min guard;Supervision Gait Distance (Feet): (hallway amb ) Assistive device: Rolling walker (2 wheeled) Gait Pattern/deviations: Step-through pattern;Decreased stride length;Narrow base of support Gait velocity: Decreased   General Gait Details: cues for increased stride length, posture, and upward gaze; cues to decr reliance on UEs   Stairs             Wheelchair Mobility    Modified Rankin (Stroke Patients Only)       Balance     Sitting balance-Leahy Scale: Good     Standing balance support: No upper extremity supported Standing balance-Leahy Scale: Fair Standing balance comment: pt is able to maintain static stand wihtout UE support, reliant on UEs for dynamic tasks                            Cognition Arousal/Alertness: Awake/alert Behavior During Therapy: WFL for tasks assessed/performed Overall Cognitive Status: Within Functional Limits for tasks assessed                                        Exercises  LAQs bil LEs x 10    General Comments        Pertinent Vitals/Pain Pain Assessment: No/denies pain(no pain at present, certain positions cause L rib cage pain)    Home Living                      Prior Function  PT Goals (current goals can now be found in the care plan section) Acute Rehab PT Goals Patient Stated Goal: to get back to mowing her lawn PT Goal Formulation: With patient Time For Goal Achievement: 02/15/18 Potential to Achieve Goals: Good Progress towards PT goals: Progressing toward goals    Frequency    Min 4X/week      PT Plan Current plan remains appropriate    Co-evaluation              AM-PAC PT "6 Clicks" Daily Activity  Outcome Measure  Difficulty turning over in bed (including adjusting bedclothes, sheets and blankets)?: Unable Difficulty moving from lying on back to sitting on  the side of the bed? : Unable Difficulty sitting down on and standing up from a chair with arms (e.g., wheelchair, bedside commode, etc,.)?: Unable Help needed moving to and from a bed to chair (including a wheelchair)?: A Little Help needed walking in hospital room?: A Little Help needed climbing 3-5 steps with a railing? : A Little 6 Click Score: 12    End of Session Equipment Utilized During Treatment: Gait belt;Back brace Activity Tolerance: Patient tolerated treatment well Patient left: with call bell/phone within reach;in chair   PT Visit Diagnosis: Unsteadiness on feet (R26.81);Difficulty in walking, not elsewhere classified (R26.2)     Time: 4235-3614 PT Time Calculation (min) (ACUTE ONLY): 33 min  Charges:  $Gait Training: 23-37 mins                     Kenyon Ana, PT  Pager: [phone] Acute Rehab Dept [facility]): [phone]   02/10/2018    [facility] 02/10/2018, 3:32 PM

## 2018-02-10 NOTE — Plan of Care (Signed)
  Problem: Pain Managment: Goal: General experience of comfort will improve Outcome: Progressing   Problem: Safety: Goal: Ability to remain free from injury will improve Outcome: Progressing   

## 2018-02-10 NOTE — Progress Notes (Signed)
[facility] PROGRESS NOTE  SHAWNITA KRIZEK HEN:[id number] DOB: [birth year] DOA: 01/31/2018 PCP: Crist Infante, MD  Brief summary   84 year old female with past medical history for coronary artery disease, chronic heart failure, carotid vascular disease, dyslipidemia and hypothyroidism presented with mechanical fall, back pains.  Patient sustained a mechanical fall from her own height, trauma to her chest and back, no head trauma loss of consciousness. CT of the abdomen showed T6 subacute compression fracture with about 25% loss of height of the anterior vertebral body.  Patient was seen by interventional radiology and underwent kyphoplasty on 10/7.  Seen by physical therapy who recommended skilled nursing versus inpatient rehab.  Currently awaiting insurance approval for skilled nursing.  Patient doing okay.  Some back pain when brace not put on properly.  Otherwise no complaints.  Assessment/Plan:  T6 acute/ subacute compression fracture.   Status post kyphoplasty done 10/7.  PT recommending skilled nursing versus inpatient rehab.  Pain better controlled.  Awaiting insurance approval.   History of CAD. No acute cardiopulmonary symptoms. Cont home regimen  Chronic heart failure. LV dysfunction . EF 30-35% (2019). No s/s of acute heart failure. No fluid overload. Patient is low dose BB, not on ACE/ARB likely due to soft BP. Cont outpatient cardiology follow up   Dyslipidemia. Continue rosuvastatin.   Hypothyroid. On levothyroxine.   Hypertension.  On Coreg, monitor blood pressure, slightly soft this morning  Hypokalemia.  Resolved.    Acute migraine: Resolved.  Code Status: full Family Communication: Left message for family Disposition Plan: Skilled nursing when bed available   Consultants:  IR  Procedures:  kyphoplasty   Antibiotics: Preop Ancef   Objective: Vitals:   02/10/18 1042 02/10/18 1442  BP: 108/61 (!) 93/53  Pulse: 71 68  Resp:  18  Temp:  98.7 F  (37.1 C)  SpO2:  95%    Intake/Output Summary (Last 24 hours) at 02/10/2018 1625 Last data filed at 02/10/2018 1300 Gross per 24 hour  Intake 600 ml  Output -  Net 600 ml   Filed Weights   01/31/18 2110  Weight: 57.6 kg    Exam:   General: Alert and oriented x3, no acute distress  Cardiovascular: Regular rate and rhythm, S1-S2  Respiratory: Clear to auscultation bilaterally  Abdomen: Soft, nontender, nondistended, positive bowel sounds  Musculoskeletal: No clubbing cyanosis or edema  Data Reviewed: Basic Metabolic Panel: Recent Labs  Lab 02/06/18 0752  NA 134*  K 4.0  CL 98  CO2 27  GLUCOSE 84  BUN 13  CREATININE 1.00  CALCIUM 9.1  MG 2.2  PHOS 3.3   Liver Function Tests: Recent Labs  Lab 02/06/18 0752  ALBUMIN 3.4*   No results for input(s): LIPASE, AMYLASE in the last 168 hours. No results for input(s): AMMONIA in the last 168 hours. CBC: Recent Labs  Lab 02/06/18 0752  WBC 8.1  NEUTROABS 4.2  HGB 13.1  HCT 41.0  MCV 99.8  PLT 214   Cardiac Enzymes: No results for input(s): CKTOTAL, CKMB, CKMBINDEX, TROPONINI in the last 168 hours. BNP (last 3 results) No results for input(s): BNP in the last 8760 hours.  ProBNP (last 3 results) No results for input(s): PROBNP in the last 8760 hours.  CBG: No results for input(s): GLUCAP in the last 168 hours.  Recent Results (from the past 240 hour(s))  Surgical pcr screen     Status: None   Collection Time: 02/07/18  4:55 AM  Result Value Ref Range Status  MRSA, PCR NEGATIVE NEGATIVE Final   Staphylococcus aureus NEGATIVE NEGATIVE Final    Comment: (NOTE) The Xpert SA Assay (FDA approved for NASAL specimens in patients 82 years of age and older), is one component of a comprehensive surveillance program. It is not intended to diagnose infection nor to guide or monitor treatment. Performed at [facility], [place] [address] [place],  [address]      Studies: No results  found.  Scheduled Meds: . carvedilol  3.125 mg Oral BID WC  . levothyroxine  75 mcg Oral QAC breakfast  . lidocaine  1 patch Transdermal Q24H  . oxyCODONE  10 mg Oral Q12H  . polyethylene glycol  17 g Oral Daily  . potassium chloride  40 mEq Oral Once  . rosuvastatin  40 mg Oral Daily  . senna  1 tablet Oral BID  . venlafaxine XR  150 mg Oral Daily   Continuous Infusions:   Active Problems:   Arteriosclerosis of coronary artery   Hypothyroidism   Hyperlipidemia   Cardiomyopathy ([place])   Hypokalemia   Closed wedge compression fracture of T6 vertebra (H[facility])   Chest pain   Coronary artery disease   Closed wedge compression fracture of sixth thoracic vertebra ([place])    Time spent: >35 minutes     Sendil Wynelle Link  [facility] Pager [phone]. If 7PM-7AM, please contact night-coverage at [email or web address], password [facility] 02/10/2018, 4:25 PM  LOS: 7 days

## 2018-02-10 NOTE — Progress Notes (Signed)
Occupational Therapy Treatment Patient Details Name: Susan [name] MRN: [id number] DOB: 10/24/1933 Today's Date: 02/10/2018    History of present illness Pt is an 84 y/o female who presents s/p fall at home. Pt hit her R knee and top of her chin on the cofee table and sustained a T6 fracture. C-spine cleared. PMH significant for osteoporosis, LAD stenosis, hypothyroidism, CAD, right/left heart cath and coronary angiography 10/08/2017, lumbar laminectomy, fractured wrist repair.    OT comments  Pt progressing towards OT goals this session. Pt continues to requires significant assist with brace management, mobility improving in room for functional ADL, able to perform toilet transfers at min A and front peri care. Sink level grooming with cues for back precautions. Pt continues to require post-acute therapy to maximize independence and  reinforce back precautions and for safety.    Follow Up Recommendations  CIR;Supervision/Assistance - 24 hour    Equipment Recommendations  Other (comment)(defer to next venue)    Recommendations for Other Services Rehab consult;PT consult    Precautions / Restrictions Precautions Precautions: Fall;Back Precaution Comments: pt able to recall 3/3 precautions but does need cues to adhere to precautions while mobilizing Required Braces or Orthoses: Spinal Brace Spinal Brace: Thoracolumbosacral orthotic;Applied in sitting position Restrictions Weight Bearing Restrictions: No Other Position/Activity Restrictions: pt in bed on arrival; educated pt on correct technique and encouraged her to correct staff/family  should they don it incorrectly       Mobility Bed Mobility Overal bed mobility: Needs Assistance Bed Mobility: Rolling;Sidelying to Sit Rolling: Supervision Sidelying to sit: Min guard;Min assist       General bed mobility comments: Pt OOB in recliner at beginning and end of session  Transfers Overall transfer level: Needs  assistance Equipment used: Rolling walker (2 wheeled) Transfers: Sit to/from Stand Sit to Stand: Min guard         General transfer comment: cues for safe hand placement; min guard for safety    Balance Overall balance assessment: Needs assistance   Sitting balance-Leahy Scale: Good     Standing balance support: No upper extremity supported Standing balance-Leahy Scale: Fair Standing balance comment: pt is able to maintain static stand wihtout UE support, reliant on UEs for dynamic tasks                           ADL either performed or assessed with clinical judgement   ADL Overall ADL's : Needs assistance/impaired                 Upper Body Dressing : Maximal assistance;Sitting Upper Body Dressing Details (indicate cue type and reason): max A to dno brace in sitting (after PT session) Pt requires cues for back precautions Lower Body Dressing: Moderate assistance;Cueing for sequencing;With adaptive equipment;Sit to/from stand Lower Body Dressing Details (indicate cue type and reason): mod A to manage AE for practice donning socks Toilet Transfer: Minimal assistance;Ambulation;RW Toilet Transfer Details (indicate cue type and reason): into bathroom Toileting- Clothing Manipulation and Hygiene: Min guard;Sitting/lateral lean Toileting - Clothing Manipulation Details (indicate cue type and reason): front peri care       General ADL Comments: pt with increased questions regarding brace management, bed mobility and back precautions; education provided throughout session; pt motivated to return to [employer]     Vision       Perception     Praxis      Cognition Arousal/Alertness: Awake/alert Behavior During Therapy: [facility] for tasks assessed/performed Overall  Cognitive Status: Within Functional Limits for tasks assessed                                          Exercises     Shoulder Instructions       General Comments      Pertinent  Vitals/ Pain       Pain Assessment: No/denies pain Pain Intervention(s): Monitored during session  Home Living                                          Prior Functioning/Environment              Frequency  Min 2X/week        Progress Toward Goals  OT Goals(current goals can now be found in the care plan section)  Progress towards OT goals: Progressing toward goals  Acute Rehab OT Goals Patient Stated Goal: to get back to mowing her lawn OT Goal Formulation: With patient Time For Goal Achievement: 02/15/18 Potential to Achieve Goals: Good  Plan Discharge plan remains appropriate;Frequency remains appropriate    Co-evaluation                 AM-PAC PT "6 Clicks" Daily Activity     Outcome Measure   Help from another person eating meals?: None Help from another person taking care of personal grooming?: A Little Help from another person toileting, which includes using toliet, bedpan, or urinal?: A Little Help from another person bathing (including washing, rinsing, drying)?: A Lot Help from another person to put on and taking off regular upper body clothing?: A Lot Help from another person to put on and taking off regular lower body clothing?: A Lot 6 Click Score: 16    End of Session Equipment Utilized During Treatment: Gait belt;Rolling walker;Back brace  OT Visit Diagnosis: Unsteadiness on feet (R26.81);Other abnormalities of gait and mobility (R26.89);Muscle weakness (generalized) (M62.81);Other symptoms and signs involving cognitive function;Pain Pain - Right/Left: Right Pain - part of body: (back)   Activity Tolerance Patient tolerated treatment well   Patient Left in chair;with call bell/phone within reach;with family/visitor present   Nurse Communication Mobility status        Time: 5701-7793 OT Time Calculation (min): 19 min  Charges: OT General Charges $OT Visit: 1 Visit OT Treatments $Self Care/Home Management : 8-22  mins  Hulda Humphrey OTR/L Acute Rehabilitation Services Pager: [phone] Office: [place] 02/10/2018, 5:34 PM

## 2018-02-11 DIAGNOSIS — R2681 Unsteadiness on feet: Secondary | ICD-10-CM | POA: Diagnosis not present

## 2018-02-11 DIAGNOSIS — M6281 Muscle weakness (generalized): Secondary | ICD-10-CM | POA: Diagnosis not present

## 2018-02-11 DIAGNOSIS — R0789 Other chest pain: Secondary | ICD-10-CM | POA: Diagnosis not present

## 2018-02-11 DIAGNOSIS — S22050D Wedge compression fracture of T5-T6 vertebra, subsequent encounter for fracture with routine healing: Secondary | ICD-10-CM | POA: Diagnosis not present

## 2018-02-11 DIAGNOSIS — K30 Functional dyspepsia: Secondary | ICD-10-CM | POA: Diagnosis not present

## 2018-02-11 DIAGNOSIS — I5022 Chronic systolic (congestive) heart failure: Secondary | ICD-10-CM | POA: Diagnosis not present

## 2018-02-11 DIAGNOSIS — S22050K Wedge compression fracture of T5-T6 vertebra, subsequent encounter for fracture with nonunion: Secondary | ICD-10-CM | POA: Diagnosis not present

## 2018-02-11 DIAGNOSIS — I10 Essential (primary) hypertension: Secondary | ICD-10-CM | POA: Diagnosis not present

## 2018-02-11 DIAGNOSIS — I2510 Atherosclerotic heart disease of native coronary artery without angina pectoris: Secondary | ICD-10-CM | POA: Diagnosis not present

## 2018-02-11 DIAGNOSIS — E039 Hypothyroidism, unspecified: Secondary | ICD-10-CM | POA: Diagnosis not present

## 2018-02-11 DIAGNOSIS — E876 Hypokalemia: Secondary | ICD-10-CM | POA: Diagnosis not present

## 2018-02-11 DIAGNOSIS — S22050A Wedge compression fracture of T5-T6 vertebra, initial encounter for closed fracture: Secondary | ICD-10-CM | POA: Diagnosis not present

## 2018-02-11 DIAGNOSIS — Z9181 History of falling: Secondary | ICD-10-CM | POA: Diagnosis not present

## 2018-02-11 DIAGNOSIS — K5900 Constipation, unspecified: Secondary | ICD-10-CM | POA: Diagnosis not present

## 2018-02-11 DIAGNOSIS — I429 Cardiomyopathy, unspecified: Secondary | ICD-10-CM | POA: Diagnosis not present

## 2018-02-11 DIAGNOSIS — S22050S Wedge compression fracture of T5-T6 vertebra, sequela: Secondary | ICD-10-CM | POA: Diagnosis not present

## 2018-02-11 DIAGNOSIS — R279 Unspecified lack of coordination: Secondary | ICD-10-CM | POA: Diagnosis not present

## 2018-02-11 DIAGNOSIS — Z4789 Encounter for other orthopedic aftercare: Secondary | ICD-10-CM | POA: Diagnosis not present

## 2018-02-11 DIAGNOSIS — R2689 Other abnormalities of gait and mobility: Secondary | ICD-10-CM | POA: Diagnosis not present

## 2018-02-11 DIAGNOSIS — Z743 Need for continuous supervision: Secondary | ICD-10-CM | POA: Diagnosis not present

## 2018-02-11 DIAGNOSIS — Z741 Need for assistance with personal care: Secondary | ICD-10-CM | POA: Diagnosis not present

## 2018-02-11 MED ORDER — OXYCODONE HCL ER 10 MG PO T12A: 10.0000 mg | EXTENDED_RELEASE_TABLET | Freq: Every day | ORAL | 0 refills | Status: DC

## 2018-02-11 MED ORDER — SENNA 8.6 MG PO TABS: 1.0000 | ORAL_TABLET | Freq: Two times a day (BID) | ORAL | 0 refills | Status: DC

## 2018-02-11 MED ORDER — LIDOCAINE 5 % EX PTCH: 1.0000 | MEDICATED_PATCH | CUTANEOUS | 0 refills | Status: DC

## 2018-02-11 MED ORDER — CARVEDILOL 3.125 MG PO TABS: 3.1250 mg | ORAL_TABLET | Freq: Two times a day (BID) | ORAL | Status: DC

## 2018-02-11 MED ORDER — ACETAMINOPHEN 325 MG PO TABS: 650.0000 mg | ORAL_TABLET | Freq: Four times a day (QID) | ORAL | Status: DC | PRN

## 2018-02-11 MED ORDER — POLYETHYLENE GLYCOL 3350 17 G PO PACK: 17.0000 g | PACK | Freq: Every day | ORAL | 0 refills | Status: DC

## 2018-02-11 MED ORDER — OXYCODONE HCL 5 MG PO TABS: 2.5000 mg | ORAL_TABLET | Freq: Four times a day (QID) | ORAL | 0 refills | Status: DC | PRN

## 2018-02-11 MED ORDER — OXYCODONE HCL ER 10 MG PO T12A: 10.0000 mg | EXTENDED_RELEASE_TABLET | Freq: Two times a day (BID) | ORAL | 0 refills | Status: DC

## 2018-02-11 NOTE — Clinical Social Work Placement (Signed)
   CLINICAL SOCIAL WORK PLACEMENT  NOTE  Date:  02/11/2018  Patient Details  Name: Susan [name] MRN: [id number] Date of Birth: 18-Apr-1934  Clinical Social Work is seeking post-discharge placement for this patient at the Reader level of care (*CSW will initial, date and re-position this form in  chart as items are completed):  Yes   Patient/family provided with [facility] Work Department's list of facilities offering this level of care within the geographic area requested by the patient (or if unable, by the patient's family).  Yes   Patient/family informed of their freedom to choose among providers that offer the needed level of care, that participate in Medicare, Medicaid or managed care program needed by the patient, have an available bed and are willing to accept the patient.      Patient/family informed of [facility]'s ownership interest in [facility] and [facility], as well as of the fact that they are under no obligation to receive care at these facilities.  PASRR submitted to EDS on       PASRR number received on 02/09/18     Existing PASRR number confirmed on       FL2 transmitted to all facilities in geographic area requested by pt/family on 02/09/18     FL2 transmitted to all facilities within larger geographic area on 02/09/18     Patient informed that his/her managed care company has contracts with or will negotiate with certain facilities, including the following:        Yes   Patient/family informed of bed offers received.  Patient chooses bed at [facility]     Physician recommends and patient chooses bed at      Patient to be transferred to [facility] on 02/11/18.  Patient to be transferred to facility by [facility]     Patient family notified on 02/11/18 of transfer.  Name of family member notified:  Margarita Grizzle     PHYSICIAN       Additional Comment:    _______________________________________________ Alberteen Sam, LCSW 02/11/2018, 1:51 PM

## 2018-02-11 NOTE — Discharge Summary (Signed)
Discharge Summary  Susan [name] PZW:[id number] DOB: 1933/08/09  PCP: Crist Infante, MD  Admit date: 01/31/2018 Discharge date: 02/11/2018  Time spent: 35 minutes  Recommendations for Outpatient Follow-up:  1. New medications: Lidocaine patch, OxyContin extended release and OxyIR 2.5 mg as needed.  See instructions below for tapering doses  Discharge Diagnoses:  Active Hospital Problems   Diagnosis Date Noted  . Closed wedge compression fracture of sixth thoracic vertebra ([place]) 02/03/2018  . Hypokalemia 02/01/2018  . Closed wedge compression fracture of T6 vertebra ([place]) 02/01/2018  . Chest pain 02/01/2018  . Coronary artery disease   . Cardiomyopathy ([place]) 10/08/2017  . Hyperlipidemia 07/18/2014  . Arteriosclerosis of coronary artery   . Hypothyroidism     Resolved Hospital Problems  No resolved problems to display.    Discharge Condition: Improved, being discharged to skilled nursing  Diet recommendation: Heart healthy  Vitals:   02/11/18 0604 02/11/18 0942  BP: (!) 108/49 (!) 110/51  Pulse: 65 67  Resp: 14   Temp: 98.4 F (36.9 C)   SpO2: 97%     History of present illness:  84 year old female with past medical history for coronary artery disease, chronic heart failure, carotid vascular disease, dyslipidemia and hypothyroidism presented with mechanical fall, back pains on 9/30. Patient sustained a mechanical fall from her own height, trauma to her chest and back, no head trauma loss of consciousness. CT of the abdomen showed T6 subacute compression fracture with about 25% loss of height of the anterior vertebral body.   Hospital Course:  Active Problems:   Arteriosclerosis of coronary artery   Hypothyroidism   Hyperlipidemia   Cardiomyopathy ([facility])   Hypokalemia   Closed wedge compression fracture of T6 vertebra ([facility])   Chest pain   Coronary artery disease   Closed wedge compression fracture of sixth thoracic vertebra ([facility]) T6 acute/ subacute closed  compression fracture.    Patient seen by interventional radiology and status post kyphoplasty done 10/7.  (Needed to hold for at least 5 days while Plavix was held and cleared her system. )  Patient tolerated this well although had significant pain before procedure, requiring lidocaine patch, every 12 hours oxy extended release and PRN morphine.  Pain relatively well controlled although at times she still requires medication for breakthrough pain when working with PT.  Prior to discharge, Oxy extended release changed to once a day.  Would recommend the following taper:   -Would continue oxy extended release for 3 more days and then discontinue on 10/14.   -Continue OxyIR 2.5 mg as needed and discontinue on 10/16. -Continue lidocaine patch and discontinue on 10/23  History of CAD and chronic systolic heart failure. No acute cardiopulmonary symptoms. LV dysfunction . EF 30-35% (2019). No s/s of acute heart failure. No fluid overload. Patient is low dose BB, not on ACE/ARB likely due to soft BP. Cont outpatient cardiology follow up.  Patient on Plavix which was held prior to procedure.  Dyslipidemia. Continue rosuvastatin.   Hypothyroid. On levothyroxine.   Hypertension.  On Coreg, monitor blood pressure.  Hypokalemia.  Replaced during hospitalization.  Acute migraine: Resolved.  Patient received Imitrex.  Procedures:  Kyphoplasty done 10/7  Consultations:  Interventional radiology  Discharge Exam: BP (!) 110/51   Pulse 67   Temp 98.4 F (36.9 C) (Oral)   Resp 14   Ht 5\' 3"  (1.6 m)   Wt 57.6 kg   SpO2 97%   BMI 22.50 kg/m   General: Alert and oriented x3, no  acute distress Cardiovascular: Regular rate and rhythm, S1-S2 Respiratory: Clear to auscultation bilaterally  Discharge Instructions You were cared for by a hospitalist during your hospital stay. If you have any questions about your discharge medications or the care you received while you were in the hospital after  you are discharged, you can call the unit and asked to speak with the hospitalist on call if the hospitalist that took care of you is not available. Once you are discharged, your primary care physician will handle any further medical issues. Please note that NO REFILLS for any discharge medications will be authorized once you are discharged, as it is imperative that you return to your primary care physician (or establish a relationship with a primary care physician if you do not have one) for your aftercare needs so that they can reassess your need for medications and monitor your lab values.   Allergies as of 02/11/2018   No Known Allergies     Medication List    TAKE these medications   acetaminophen 325 MG tablet Commonly known as:  TYLENOL Take 2 tablets (650 mg total) by mouth every 6 (six) hours as needed for mild pain (or Fever >/= 101).   CALCIUM + D PO Take 1 tablet by mouth at bedtime.   carvedilol 3.125 MG tablet Commonly known as:  COREG Take 1 tablet (3.125 mg total) by mouth 2 (two) times daily with a meal. What changed:  when to take this   clopidogrel 75 MG tablet Commonly known as:  PLAVIX TAKE 1 TABLET BY MOUTH ONCE DAILY   Fish Oil 1200 MG Caps Take 1,200 mg by mouth at bedtime.   levothyroxine 75 MCG tablet Commonly known as:  SYNTHROID, LEVOTHROID Take 1 tablet by mouth daily.   lidocaine 5 % Commonly known as:  LIDODERM Place 1 patch onto the skin daily. Remove & Discard patch within 12 hours or as directed by MD   multivitamin-lutein Caps capsule Take 1 capsule daily by mouth.   nitroGLYCERIN 0.4 MG SL tablet Commonly known as:  NITROSTAT Place 1 tablet (0.4 mg total) under the tongue every 5 (five) minutes as needed.   oxyCODONE 5 MG immediate release tablet Commonly known as:  Oxy IR/ROXICODONE Take 0.5 tablets (2.5 mg total) by mouth every 6 (six) hours as needed for breakthrough pain.   oxyCODONE 10 mg 12 hr tablet Commonly known as:   OXYCONTIN Take 1 tablet (10 mg total) by mouth daily.   polyethylene glycol packet Commonly known as:  MIRALAX / GLYCOLAX Take 17 g by mouth daily. Start taking on:  02/12/2018   rosuvastatin 40 MG tablet Commonly known as:  CRESTOR TAKE 1 TABLET BY MOUTH ONCE DAILY   senna 8.6 MG Tabs tablet Commonly known as:  SENOKOT Take 1 tablet (8.6 mg total) by mouth 2 (two) times daily.   venlafaxine XR 150 MG 24 hr capsule Commonly known as:  EFFEXOR-XR TAKE 1 CAPSULE BY MOUTH ONCE DAILY   vitamin B-12 1000 MCG tablet Commonly known as:  CYANOCOBALAMIN Take 2 tablets by mouth daily.       No Known Allergies Contact information for after-discharge care    Destination    [facility] .   Service:  Skilled Nursing Contact information: [place] [phone]               The results of significant diagnostics from this hospitalization (including imaging, microbiology, ancillary and laboratory) are listed  below for reference.    Significant Diagnostic Studies: Dg Ribs Bilateral  Result Date: 02/04/2018 CLINICAL DATA:  Status post fall at home. T6 fracture. Bilateral rib pain, RIGHT greater than LEFT. EXAM: BILATERAL RIBS - 3+ VIEW COMPARISON:  Chest CT dated 01/31/2018, MRI dated 02/01/2018 and chest x-ray dated 01/31/2018. FINDINGS: Heart size is upper normal, stable. Overall cardiomediastinal silhouette appears stable. Lungs are clear. No pleural effusion or pneumothorax seen. No rib fracture or displacement seen bilaterally. IMPRESSION: 1. No rib fracture or displacement seen. 2. No active cardiopulmonary disease. No evidence of pulmonary edema, pleural effusion or pneumothorax. Electronically Signed   By: Franki Cabot M.D.   On: 02/04/2018 13:57   Ct Head Wo Contrast  Result Date: 01/31/2018 CLINICAL DATA:  Trip and fall on to table. History of pituitary adenoma. EXAM: CT HEAD WITHOUT CONTRAST CT CERVICAL SPINE  WITHOUT CONTRAST TECHNIQUE: Multidetector CT imaging of the head and cervical spine was performed following the standard protocol without intravenous contrast. Multiplanar CT image reconstructions of the cervical spine were also generated. COMPARISON:  MRI head January 04, 2018 FINDINGS: CT HEAD FINDINGS BRAIN: No intraparenchymal hemorrhage, mass effect nor midline shift. The ventricles and sulci are normal for age. Old basal ganglia lacunar infarcts. Patchy supratentorial white matter hypodensities less than expected for patient's age, though non-specific are most compatible with chronic small vessel ischemic disease. No acute large vascular territory infarcts. No abnormal extra-axial fluid collections. Basal cisterns are patent. VASCULAR: Mild calcific atherosclerosis of the carotid siphons. SKULL: No skull fracture. Osteopenia. No significant scalp soft tissue swelling. SINUSES/ORBITS: Severe chronic RIGHT maxillary sinusitis with bony remodeling. Mastoid air cells are well aerated.The included ocular globes and orbital contents are non-suspicious. Status post bilateral ocular lens implants. OTHER: None. CT CERVICAL SPINE FINDINGS ALIGNMENT: Straightened lordosis.  Vertebral bodies in alignment. SKULL BASE AND VERTEBRAE: Cervical vertebral bodies and posterior elements are intact. Severe C3-4 disc height loss with endplate spurring compatible with degenerative disc. No destructive bony lesions. C1-2 articulation maintained. SOFT TISSUES AND SPINAL CANAL: Nonacute. DISC LEVELS: Moderate canal stenosis C3-4. Severe bilateral C3-4 and LEFT C6-7 neural foraminal narrowing. UPPER CHEST: Lung apices are clear. OTHER: None. IMPRESSION: CT HEAD: 1. No acute intracranial process. 2. Mild chronic small vessel ischemic changes and old basal ganglia lacunar infarcts. CT cervical spine: 1. No fracture or malalignment. 2. Moderate canal stenosis C3-4. Severe bilateral C3-4 and LEFT C6-7 neural foraminal narrowing.  Electronically Signed   By: Elon Alas M.D.   On: 01/31/2018 23:52   Ct Angio Neck W And/or Wo Contrast  Result Date: 01/31/2018 CLINICAL DATA:  Blunted neck trauma.  Trip and fall onto table. EXAM: CT ANGIOGRAPHY NECK TECHNIQUE: Multidetector CT imaging of the neck was performed using the standard protocol during bolus administration of intravenous contrast. Multiplanar CT image reconstructions and MIPs were obtained to evaluate the vascular anatomy. Carotid stenosis measurements (when applicable) are obtained utilizing NASCET criteria, using the distal internal carotid diameter as the denominator. CONTRAST:  117mL ISOVUE-370 IOPAMIDOL (ISOVUE-370) INJECTION 76% COMPARISON:  None. FINDINGS: AORTIC ARCH: Normal appearance of the thoracic arch, normal branch pattern. Mild calcific atherosclerosis aortic arch. The origins of the innominate, left Common carotid artery and subclavian artery are widely patent; patulous LEFT subclavian artery origin. RIGHT CAROTID SYSTEM: Common carotid artery is patent, tortuous course. Calcific atherosclerosis resulting in less than 50% stenosis by NASCET criteria. Slight linear filling defect. Patent internal carotid artery, focally beaded and tortuous. LEFT CAROTID SYSTEM: Common carotid artery is patent.  Severe calcific atherosclerosis distal common carotid artery and LEFT internal carotid artery origin with less than 50% stenosis by NASCET criteria. Patent internal carotid artery, beaded tortuous cervical internal carotid artery. VERTEBRAL ARTERIES:Codominant vertebral arteries. Moderate stenosis LEFT vertebral artery. Luminal irregularity bilateral vertebral arteries. SKELETON: No acute osseous process though bone windows have not been submitted. OTHER NECK: RIGHT clavicular soft tissue swelling, possible contusion. UPPER CHEST: Please see dedicated CT of chest from same day, reported separately. IMPRESSION: 1. No acute vascular process. 2. Less than 50% stenosis RIGHT  ICA origin. Small non flow-limiting RIGHT ICA web or old dissection without pseudoaneurysm. 3. 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 4. Moderate stenosis LEFT vertebral artery origin. Bilateral vertebral artery atherosclerosis or FMD. Aortic Atherosclerosis (ICD10-I70.0). Electronically Signed   By: Elon Alas M.D.   On: 01/31/2018 23:31   Ct Chest W Contrast  Result Date: 01/31/2018 CLINICAL DATA:  Trauma EXAM: CT CHEST, ABDOMEN, AND PELVIS WITH CONTRAST TECHNIQUE: Multidetector CT imaging of the chest, abdomen and pelvis was performed following the standard protocol during bolus administration of intravenous contrast. CONTRAST:  127mL ISOVUE-370 IOPAMIDOL (ISOVUE-370) INJECTION 76% COMPARISON:  Chest x-ray 01/31/2018 FINDINGS: CT CHEST FINDINGS Cardiovascular: Nonaneurysmal aorta. Mild aortic atherosclerosis. Coronary vascular calcification. Normal heart size. No pericardial effusion. Mediastinum/Nodes: No enlarged mediastinal, hilar, or axillary lymph nodes. Thyroid gland, trachea, and esophagus demonstrate no significant findings. Lungs/Pleura: Mild blebs at the lung bases. No acute consolidation, pleural effusion or pneumothorax. Musculoskeletal: Possible acute to subacute compression fracture T6 with about 25% loss of height of the anterior vertebral body. Sternum is intact. CT ABDOMEN PELVIS FINDINGS Hepatobiliary: Cyst in the left hepatic lobe. No calcified gallstone or biliary dilatation Pancreas: Unremarkable. No pancreatic ductal dilatation or surrounding inflammatory changes. Spleen: No splenic injury or perisplenic hematoma. Adrenals/Urinary Tract: Adrenal glands are within normal limits. No hydronephrosis. 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 Prominent right extrarenal pelvis. Dilated urinary bladder Stomach/Bowel: Stomach is within normal limits. No evidence of bowel wall thickening, distention, or inflammatory changes. Vascular/Lymphatic:  Nonaneurysmal aorta. Moderate aortic atherosclerosis. No significantly enlarged lymph nodes. Reproductive: Uterus and bilateral adnexa are unremarkable. Other: Negative for free air or free fluid. Musculoskeletal: No acute or significant osseous findings. Degenerative changes IMPRESSION: 1. No CT evidence for acute intrathoracic, intra-abdominal or intrapelvic abnormality. 2. Possible acute to subacute compression fracture T6 with about 25% loss of height of the anterior vertebral body 3. Ectopic right pelvic kidney Electronically Signed   By: Donavan Foil M.D.   On: 01/31/2018 23:28   Mr Thoracic Spine Wo Contrast  Result Date: 02/01/2018 CLINICAL DATA:  Fall with T6 fracture demonstrated by CT. EXAM: MRI THORACIC SPINE WITHOUT CONTRAST TECHNIQUE: Multiplanar, multisequence MR imaging of the thoracic spine was performed. No intravenous contrast was administered. COMPARISON:  CT 01/31/2018 FINDINGS: Alignment: Increased kyphosis at the T6 level because of the fracture described below. Vertebrae: Acute or subacute compression fracture at T6 primarily at the inferior endplate. Loss of height of 40%. Posterior bowing of the posterior inferior margin of the vertebral body by 2 mm. This narrows the ventral subarachnoid space but does not compress the cord. No epidural hematoma. This looks like a benign fracture. No other regional recent fracture. Old minimal superior endplate depression at T2. Cord:  No cord compression or primary cord lesion. Paraspinal and other soft tissues: Negative Disc levels: No significant degenerative disc disease. No disc herniation. No compressive stenosis of the canal or foramina. There are degenerative appearing endplate changes at the  L2-3 level, not studied in detail. IMPRESSION: Acute or subacute fracture at T6 with loss of height of 40%. Minimal posterior bowing of the posterior inferior margin of the vertebral body, by only 2 mm. This does not cause significant canal narrowing.  This looks like a benign fracture. Electronically Signed   By: Nelson Chimes M.D.   On: 02/01/2018 13:09   Ct Abdomen Pelvis W Contrast  Result Date: 01/31/2018 CLINICAL DATA:  Trauma EXAM: CT CHEST, ABDOMEN, AND PELVIS WITH CONTRAST TECHNIQUE: Multidetector CT imaging of the chest, abdomen and pelvis was performed following the standard protocol during bolus administration of intravenous contrast. CONTRAST:  150mL ISOVUE-370 IOPAMIDOL (ISOVUE-370) INJECTION 76% COMPARISON:  Chest x-ray 01/31/2018 FINDINGS: CT CHEST FINDINGS Cardiovascular: Nonaneurysmal aorta. Mild aortic atherosclerosis. Coronary vascular calcification. Normal heart size. No pericardial effusion. Mediastinum/Nodes: No enlarged mediastinal, hilar, or axillary lymph nodes. Thyroid gland, trachea, and esophagus demonstrate no significant findings. Lungs/Pleura: Mild blebs at the lung bases. No acute consolidation, pleural effusion or pneumothorax. Musculoskeletal: Possible acute to subacute compression fracture T6 with about 25% loss of height of the anterior vertebral body. Sternum is intact. CT ABDOMEN PELVIS FINDINGS Hepatobiliary: Cyst in the left hepatic lobe. No calcified gallstone or biliary dilatation Pancreas: Unremarkable. No pancreatic ductal dilatation or surrounding inflammatory changes. Spleen: No splenic injury or perisplenic hematoma. Adrenals/Urinary Tract: Adrenal glands are within normal limits. No hydronephrosis. 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 Prominent right extrarenal pelvis. Dilated urinary bladder Stomach/Bowel: Stomach is within normal limits. No evidence of bowel wall thickening, distention, or inflammatory changes. Vascular/Lymphatic: Nonaneurysmal aorta. Moderate aortic atherosclerosis. No significantly enlarged lymph nodes. Reproductive: Uterus and bilateral adnexa are unremarkable. Other: Negative for free air or free fluid. Musculoskeletal: No acute or significant osseous findings. Degenerative  changes IMPRESSION: 1. No CT evidence for acute intrathoracic, intra-abdominal or intrapelvic abnormality. 2. Possible acute to subacute compression fracture T6 with about 25% loss of height of the anterior vertebral body 3. Ectopic right pelvic kidney Electronically Signed   By: Donavan Foil M.D.   On: 01/31/2018 23:28   Ct C-spine No Charge  Result Date: 01/31/2018 CLINICAL DATA:  Trip and fall on to table. History of pituitary adenoma. EXAM: CT HEAD WITHOUT CONTRAST CT CERVICAL SPINE WITHOUT CONTRAST TECHNIQUE: Multidetector CT imaging of the head and cervical spine was performed following the standard protocol without intravenous contrast. Multiplanar CT image reconstructions of the cervical spine were also generated. COMPARISON:  MRI head January 04, 2018 FINDINGS: CT HEAD FINDINGS BRAIN: No intraparenchymal hemorrhage, mass effect nor midline shift. The ventricles and sulci are normal for age. Old basal ganglia lacunar infarcts. Patchy supratentorial white matter hypodensities less than expected for patient's age, though non-specific are most compatible with chronic small vessel ischemic disease. No acute large vascular territory infarcts. No abnormal extra-axial fluid collections. Basal cisterns are patent. VASCULAR: Mild calcific atherosclerosis of the carotid siphons. SKULL: No skull fracture. Osteopenia. No significant scalp soft tissue swelling. SINUSES/ORBITS: Severe chronic RIGHT maxillary sinusitis with bony remodeling. Mastoid air cells are well aerated.The included ocular globes and orbital contents are non-suspicious. Status post bilateral ocular lens implants. OTHER: None. CT CERVICAL SPINE FINDINGS ALIGNMENT: Straightened lordosis.  Vertebral bodies in alignment. SKULL BASE AND VERTEBRAE: Cervical vertebral bodies and posterior elements are intact. Severe C3-4 disc height loss with endplate spurring compatible with degenerative disc. No destructive bony lesions. C1-2 articulation maintained.  SOFT TISSUES AND SPINAL CANAL: Nonacute. DISC LEVELS: Moderate canal stenosis C3-4. Severe bilateral C3-4 and LEFT C6-7 neural  foraminal narrowing. UPPER CHEST: Lung apices are clear. OTHER: None. IMPRESSION: CT HEAD: 1. No acute intracranial process. 2. Mild chronic small vessel ischemic changes and old basal ganglia lacunar infarcts. CT cervical spine: 1. No fracture or malalignment. 2. Moderate canal stenosis C3-4. Severe bilateral C3-4 and LEFT C6-7 neural foraminal narrowing. Electronically Signed   By: Elon Alas M.D.   On: 01/31/2018 23:52   Dg Chest Port 1 View  Result Date: 01/31/2018 CLINICAL DATA:  Chest pain after trip and fall striking table. EXAM: PORTABLE CHEST 1 VIEW COMPARISON:  08/16/2010 FINDINGS: Lower lung volumes from prior exam lead to bronchovascular crowding. Mild cardiomegaly is similar allowing for differences in technique, likely accentuated by low lung volumes. Unchanged mediastinal contours. No pneumothorax or focal airspace disease. No large pleural effusion. The bones are under mineralized. No acute osseous abnormalities are seen. IMPRESSION: Low lung volumes without acute abnormality. Electronically Signed   By: Keith Rake M.D.   On: 01/31/2018 22:23   Dg Knee Complete 4 Views Left  Result Date: 01/31/2018 CLINICAL DATA:  Trip and fall into table with bilateral knee pain. EXAM: LEFT KNEE - COMPLETE 4+ VIEW COMPARISON:  None. FINDINGS: No fracture or dislocation. No significant joint effusion. There is prepatellar soft tissue edema. Chondrocalcinosis of the medial and lateral tibiofemoral compartments with mild tricompartmental joint space narrowing and peripheral spurring. The bones are under mineralized. IMPRESSION: 1. Prepatellar soft tissue edema. No fracture or dislocation. 2. Chondrocalcinosis and mild tricompartmental osteoarthritis. Electronically Signed   By: Keith Rake M.D.   On: 01/31/2018 22:26   Dg Knee Complete 4 Views Right  Result Date:  01/31/2018 CLINICAL DATA:  Trip and fall into table with bilateral knee pain. EXAM: RIGHT KNEE - COMPLETE 4+ VIEW COMPARISON:  None. FINDINGS: No fracture or dislocation. No significant joint effusion. There is prepatellar soft tissue edema. Medial and lateral tibiofemoral chondrocalcinosis. Medial joint space narrowing with peripheral spurring. Small quadriceps tendon enthesophyte. IMPRESSION: 1. Prepatellar soft tissue edema.  No fracture or dislocation. 2. Mild osteoarthritis and chondrocalcinosis. Electronically Signed   By: Keith Rake M.D.   On: 01/31/2018 22:27   Ir Vertebroplasty Cerv/thor Bx Inc Uni/bil Inc/inject/imaging  Result Date: 02/08/2018 INDICATION: Severe midthoracic pain secondary to compression fracture at T6. EXAM: VERTEBROPLASTY AT T6 MEDICATIONS: As antibiotic prophylaxis, Ancef 2 g IV was ordered pre-procedure and administered intravenously within 1 hour of incision. ANESTHESIA/SEDATION: Moderate (conscious) sedation was employed during this procedure. A total of Versed 1 mg and Fentanyl 25 mcg was administered intravenously. Moderate Sedation Time: 25 minutes. The patient's level of consciousness and vital signs were monitored continuously by radiology nursing throughout the procedure under my direct supervision. FLUOROSCOPY TIME:  Fluoroscopy Time: 9 minutes 42 seconds (789 mGy) COMPLICATIONS: None immediate. TECHNIQUE: Informed written consent was obtained from the patient after a thorough discussion of the procedural risks, benefits and alternatives. All questions were addressed. Maximal Sterile Barrier Technique was utilized including caps, mask, sterile gowns, sterile gloves, sterile drape, hand hygiene and skin antiseptic. A timeout was performed prior to the initiation of the procedure. PROCEDURE: The patient was placed prone on the fluoroscopic table. Nasal oxygen was administered. Physiologic monitoring was performed throughout the duration of the procedure. The skin  overlying the thoracic region was prepped and draped in the usual sterile fashion. The T6 vertebral body was identified and the right and left pedicles were infiltrated with 0.25% Bupivacaine. This was then followed by the advancement of a 13-gauge Cook needles through the pedicles  into the anterior one-third at T6. A gentle contrast injection demonstrated a trabecular pattern of contrast. At this time, methylmethacrylate mixture was reconstituted. Under biplane intermittent fluoroscopy, the methylmethacrylate was then injected into the T6 vertebral body with filling of the vertebral body. No extravasation was noted into the disk spaces or posteriorly into the spinal canal. No epidural venous contamination was seen. The needle was then removed. Hemostasis was achieved at the skin entry sites. There were no acute complications. Patient tolerated the procedure well. The patient was returned to the floor in good condition. IMPRESSION: 1. Status post vertebral body augmentation for painful compression fracture at T6 using vertebroplasty technique. Electronically Signed   By: Luanne Bras M.D.   On: 02/07/2018 10:47    Microbiology: Recent Results (from the past 240 hour(s))  Surgical pcr screen     Status: None   Collection Time: 02/07/18  4:55 AM  Result Value Ref Range Status   MRSA, PCR NEGATIVE NEGATIVE Final   Staphylococcus aureus NEGATIVE NEGATIVE Final    Comment: (NOTE) The Xpert SA Assay (FDA approved for NASAL specimens in patients 37 years of age and older), is one component of a comprehensive surveillance program. It is not intended to diagnose infection nor to guide or monitor treatment. Performed at [facility], [place] [address] [place], [place] [address]      Labs: Basic Metabolic Panel: Recent Labs  Lab 02/06/18 0752  NA 134*  K 4.0  CL 98  CO2 27  GLUCOSE 84  BUN 13  CREATININE 1.00  CALCIUM 9.1  MG 2.2  PHOS 3.3   Liver Function Tests: Recent Labs  Lab  02/06/18 0752  ALBUMIN 3.4*   No results for input(s): LIPASE, AMYLASE in the last 168 hours. No results for input(s): AMMONIA in the last 168 hours. CBC: Recent Labs  Lab 02/06/18 0752  WBC 8.1  NEUTROABS 4.2  HGB 13.1  HCT 41.0  MCV 99.8  PLT 214   Cardiac Enzymes: No results for input(s): CKTOTAL, CKMB, CKMBINDEX, TROPONINI in the last 168 hours. BNP: BNP (last 3 results) No results for input(s): BNP in the last 8760 hours.  ProBNP (last 3 results) No results for input(s): PROBNP in the last 8760 hours.  CBG: No results for input(s): GLUCAP in the last 168 hours.     Signed:  Annita Brod, MD [facility] 02/11/2018, 12:46 PM

## 2018-02-11 NOTE — Progress Notes (Signed)
Patient will DC to: [facility] Anticipated DC date: 02/11/18 Family notified: Margarita Grizzle Transport by: Corey Harold  Per MD patient ready for DC to [facility] . RN, patient, patient's family, and facility notified of DC. Discharge Summary sent to facility. RN given number for report [phone] Room 703P. DC packet on chart. Ambulance transport requested for patient.  CSW signing off.  [place], [place]

## 2018-02-11 NOTE — Progress Notes (Signed)
AVS, prescriptions, and forms printed for transport. Report called to [place] at [facility]. Awaiting PTAR.

## 2018-02-14 DIAGNOSIS — I5022 Chronic systolic (congestive) heart failure: Secondary | ICD-10-CM | POA: Diagnosis not present

## 2018-02-14 DIAGNOSIS — S22050D Wedge compression fracture of T5-T6 vertebra, subsequent encounter for fracture with routine healing: Secondary | ICD-10-CM | POA: Diagnosis not present

## 2018-02-14 DIAGNOSIS — I2510 Atherosclerotic heart disease of native coronary artery without angina pectoris: Secondary | ICD-10-CM | POA: Diagnosis not present

## 2018-02-14 DIAGNOSIS — I10 Essential (primary) hypertension: Secondary | ICD-10-CM | POA: Diagnosis not present

## 2018-02-15 DIAGNOSIS — R2681 Unsteadiness on feet: Secondary | ICD-10-CM | POA: Diagnosis not present

## 2018-02-15 DIAGNOSIS — Z9181 History of falling: Secondary | ICD-10-CM | POA: Diagnosis not present

## 2018-02-15 DIAGNOSIS — K30 Functional dyspepsia: Secondary | ICD-10-CM | POA: Diagnosis not present

## 2018-02-15 DIAGNOSIS — K5900 Constipation, unspecified: Secondary | ICD-10-CM | POA: Diagnosis not present

## 2018-02-15 DIAGNOSIS — R0789 Other chest pain: Secondary | ICD-10-CM | POA: Diagnosis not present

## 2018-02-17 ENCOUNTER — Other Ambulatory Visit (HOSPITAL_COMMUNITY): Payer: Self-pay | Admitting: Interventional Radiology

## 2018-02-17 DIAGNOSIS — S22050A Wedge compression fracture of T5-T6 vertebra, initial encounter for closed fracture: Secondary | ICD-10-CM

## 2018-02-21 DIAGNOSIS — R0789 Other chest pain: Secondary | ICD-10-CM | POA: Diagnosis not present

## 2018-02-21 DIAGNOSIS — R2681 Unsteadiness on feet: Secondary | ICD-10-CM | POA: Diagnosis not present

## 2018-02-21 DIAGNOSIS — Z9181 History of falling: Secondary | ICD-10-CM | POA: Diagnosis not present

## 2018-02-21 DIAGNOSIS — K5900 Constipation, unspecified: Secondary | ICD-10-CM | POA: Diagnosis not present

## 2018-02-23 DIAGNOSIS — R2681 Unsteadiness on feet: Secondary | ICD-10-CM | POA: Diagnosis not present

## 2018-02-23 DIAGNOSIS — Z9181 History of falling: Secondary | ICD-10-CM | POA: Diagnosis not present

## 2018-02-23 DIAGNOSIS — R0789 Other chest pain: Secondary | ICD-10-CM | POA: Diagnosis not present

## 2018-02-24 ENCOUNTER — Ambulatory Visit (HOSPITAL_COMMUNITY): Payer: Medicare Other

## 2018-02-24 DIAGNOSIS — I2510 Atherosclerotic heart disease of native coronary artery without angina pectoris: Secondary | ICD-10-CM | POA: Diagnosis not present

## 2018-02-24 DIAGNOSIS — K5900 Constipation, unspecified: Secondary | ICD-10-CM | POA: Diagnosis not present

## 2018-02-24 DIAGNOSIS — K30 Functional dyspepsia: Secondary | ICD-10-CM | POA: Diagnosis not present

## 2018-02-24 DIAGNOSIS — S22050D Wedge compression fracture of T5-T6 vertebra, subsequent encounter for fracture with routine healing: Secondary | ICD-10-CM | POA: Diagnosis not present

## 2018-02-25 DIAGNOSIS — I2510 Atherosclerotic heart disease of native coronary artery without angina pectoris: Secondary | ICD-10-CM | POA: Diagnosis not present

## 2018-02-25 DIAGNOSIS — I429 Cardiomyopathy, unspecified: Secondary | ICD-10-CM | POA: Diagnosis not present

## 2018-02-25 DIAGNOSIS — I428 Other cardiomyopathies: Secondary | ICD-10-CM | POA: Diagnosis not present

## 2018-02-25 DIAGNOSIS — S22050D Wedge compression fracture of T5-T6 vertebra, subsequent encounter for fracture with routine healing: Secondary | ICD-10-CM | POA: Diagnosis not present

## 2018-02-25 DIAGNOSIS — I5022 Chronic systolic (congestive) heart failure: Secondary | ICD-10-CM | POA: Diagnosis not present

## 2018-02-25 DIAGNOSIS — M4802 Spinal stenosis, cervical region: Secondary | ICD-10-CM | POA: Diagnosis not present

## 2018-03-23 DIAGNOSIS — Z1231 Encounter for screening mammogram for malignant neoplasm of breast: Secondary | ICD-10-CM | POA: Diagnosis not present

## 2018-03-29 DIAGNOSIS — Z961 Presence of intraocular lens: Secondary | ICD-10-CM | POA: Diagnosis not present

## 2018-03-29 DIAGNOSIS — H43813 Vitreous degeneration, bilateral: Secondary | ICD-10-CM | POA: Diagnosis not present

## 2018-03-29 DIAGNOSIS — H353131 Nonexudative age-related macular degeneration, bilateral, early dry stage: Secondary | ICD-10-CM | POA: Diagnosis not present

## 2018-06-01 ENCOUNTER — Other Ambulatory Visit: Payer: Self-pay | Admitting: Cardiovascular Disease

## 2018-07-13 DIAGNOSIS — J209 Acute bronchitis, unspecified: Secondary | ICD-10-CM | POA: Diagnosis not present

## 2018-07-18 DIAGNOSIS — E538 Deficiency of other specified B group vitamins: Secondary | ICD-10-CM | POA: Diagnosis not present

## 2018-07-25 DIAGNOSIS — E7849 Other hyperlipidemia: Secondary | ICD-10-CM | POA: Diagnosis not present

## 2018-07-25 DIAGNOSIS — E038 Other specified hypothyroidism: Secondary | ICD-10-CM | POA: Diagnosis not present

## 2018-07-25 DIAGNOSIS — M810 Age-related osteoporosis without current pathological fracture: Secondary | ICD-10-CM | POA: Diagnosis not present

## 2018-07-25 DIAGNOSIS — E538 Deficiency of other specified B group vitamins: Secondary | ICD-10-CM | POA: Diagnosis not present

## 2018-08-01 DIAGNOSIS — Z0000 Encounter for general adult medical examination without abnormal findings: Secondary | ICD-10-CM | POA: Diagnosis not present

## 2018-08-01 DIAGNOSIS — E538 Deficiency of other specified B group vitamins: Secondary | ICD-10-CM | POA: Diagnosis not present

## 2018-08-01 DIAGNOSIS — I2510 Atherosclerotic heart disease of native coronary artery without angina pectoris: Secondary | ICD-10-CM | POA: Diagnosis not present

## 2018-08-01 DIAGNOSIS — K5900 Constipation, unspecified: Secondary | ICD-10-CM | POA: Diagnosis not present

## 2018-09-12 DIAGNOSIS — Z85828 Personal history of other malignant neoplasm of skin: Secondary | ICD-10-CM | POA: Diagnosis not present

## 2018-09-12 DIAGNOSIS — L570 Actinic keratosis: Secondary | ICD-10-CM | POA: Diagnosis not present

## 2018-09-12 DIAGNOSIS — L720 Epidermal cyst: Secondary | ICD-10-CM | POA: Diagnosis not present

## 2018-09-12 DIAGNOSIS — L821 Other seborrheic keratosis: Secondary | ICD-10-CM | POA: Diagnosis not present

## 2018-09-20 ENCOUNTER — Other Ambulatory Visit: Payer: Self-pay | Admitting: Internal Medicine

## 2018-09-20 ENCOUNTER — Encounter (HOSPITAL_COMMUNITY): Payer: Medicare Other

## 2018-09-20 NOTE — Telephone Encounter (Signed)
Please review for refill.  

## 2018-09-22 ENCOUNTER — Other Ambulatory Visit: Payer: Self-pay

## 2018-09-22 ENCOUNTER — Ambulatory Visit (HOSPITAL_COMMUNITY)
Admission: RE | Admit: 2018-09-22 | Discharge: 2018-09-22 | Disposition: A | Discharge status: Home / Self Care | Payer: Medicare Other | Source: Ambulatory Visit | Attending: Internal Medicine | Admitting: Internal Medicine

## 2018-09-22 DIAGNOSIS — M810 Age-related osteoporosis without current pathological fracture: Secondary | ICD-10-CM | POA: Insufficient documentation

## 2018-09-22 MED ORDER — DENOSUMAB 60 MG/ML ~~LOC~~ SOSY
60.0000 mg | PREFILLED_SYRINGE | Freq: Once | SUBCUTANEOUS | Status: AC
  Administered 2018-09-22: 60 mg via SUBCUTANEOUS

## 2018-09-22 MED ORDER — DENOSUMAB 60 MG/ML ~~LOC~~ SOSY
PREFILLED_SYRINGE | SUBCUTANEOUS | Status: AC
  Administered 2018-09-22: 60 mg via SUBCUTANEOUS
  Filled 2018-09-22: qty 1

## 2018-09-22 NOTE — Discharge Instructions (Signed)
Denosumab injection °What is this medicine? °DENOSUMAB (den oh sue mab) slows bone breakdown. Prolia is used to treat osteoporosis in women after menopause and in men, and in people who are taking corticosteroids for 6 months or more. Xgeva is used to treat a high calcium level due to cancer and to prevent bone fractures and other bone problems caused by multiple myeloma or cancer bone metastases. Xgeva is also used to treat giant cell tumor of the bone. °This medicine may be used for other purposes; ask your health care provider or pharmacist if you have questions. °COMMON BRAND NAME(S): Prolia, XGEVA °What should I tell my health care provider before I take this medicine? °They need to know if you have any of these conditions: °-dental disease °-having surgery or tooth extraction °-infection °-kidney disease °-low levels of calcium or Vitamin D in the blood °-malnutrition °-on hemodialysis °-skin conditions or sensitivity °-thyroid or parathyroid disease °-an unusual reaction to denosumab, other medicines, foods, dyes, or preservatives °-pregnant or trying to get pregnant °-breast-feeding °How should I use this medicine? °This medicine is for injection under the skin. It is given by a health care professional in a hospital or clinic setting. °A special MedGuide will be given to you before each treatment. Be sure to read this information carefully each time. °For Prolia, talk to your pediatrician regarding the use of this medicine in children. Special care may be needed. For Xgeva, talk to your pediatrician regarding the use of this medicine in children. While this drug may be prescribed for children as young as 13 years for selected conditions, precautions do apply. °Overdosage: If you think you have taken too much of this medicine contact a poison control center or emergency room at once. °NOTE: This medicine is only for you. Do not share this medicine with others. °What if I miss a dose? °It is important not to  miss your dose. Call your doctor or health care professional if you are unable to keep an appointment. °What may interact with this medicine? °Do not take this medicine with any of the following medications: °-other medicines containing denosumab °This medicine may also interact with the following medications: °-medicines that lower your chance of fighting infection °-steroid medicines like prednisone or cortisone °This list may not describe all possible interactions. Give your health care provider a list of all the medicines, herbs, non-prescription drugs, or dietary supplements you use. Also tell them if you smoke, drink alcohol, or use illegal drugs. Some items may interact with your medicine. °What should I watch for while using this medicine? °Visit your doctor or health care professional for regular checks on your progress. Your doctor or health care professional may order blood tests and other tests to see how you are doing. °Call your doctor or health care professional for advice if you get a fever, chills or sore throat, or other symptoms of a cold or flu. Do not treat yourself. This drug may decrease your body's ability to fight infection. Try to avoid being around people who are sick. °You should make sure you get enough calcium and vitamin D while you are taking this medicine, unless your doctor tells you not to. Discuss the foods you eat and the vitamins you take with your health care professional. °See your dentist regularly. Brush and floss your teeth as directed. Before you have any dental work done, tell your dentist you are receiving this medicine. °Do not become pregnant while taking this medicine or for 5 months   after stopping it. Talk with your doctor or health care professional about your birth control options while taking this medicine. Women should inform their doctor if they wish to become pregnant or think they might be pregnant. There is a potential for serious side effects to an unborn  child. Talk to your health care professional or pharmacist for more information. °What side effects may I notice from receiving this medicine? °Side effects that you should report to your doctor or health care professional as soon as possible: °-allergic reactions like skin rash, itching or hives, swelling of the face, lips, or tongue °-bone pain °-breathing problems °-dizziness °-jaw pain, especially after dental work °-redness, blistering, peeling of the skin °-signs and symptoms of infection like fever or chills; cough; sore throat; pain or trouble passing urine °-signs of low calcium like fast heartbeat, muscle cramps or muscle pain; pain, tingling, numbness in the hands or feet; seizures °-unusual bleeding or bruising °-unusually weak or tired °Side effects that usually do not require medical attention (report to your doctor or health care professional if they continue or are bothersome): °-constipation °-diarrhea °-headache °-joint pain °-loss of appetite °-muscle pain °-runny nose °-tiredness °-upset stomach °This list may not describe all possible side effects. Call your doctor for medical advice about side effects. You may report side effects to FDA at 1-800-FDA-1088. °Where should I keep my medicine? °This medicine is only given in a clinic, doctor's office, or other health care setting and will not be stored at home. °NOTE: This sheet is a summary. It may not cover all possible information. If you have questions about this medicine, talk to your doctor, pharmacist, or health care provider. °© 2019 Elsevier/Gold Standard (2017-08-27 16:10:44) ° °

## 2018-09-29 DIAGNOSIS — Z0120 Encounter for dental examination and cleaning without abnormal findings: Secondary | ICD-10-CM | POA: Diagnosis not present

## 2018-11-12 ENCOUNTER — Emergency Department (HOSPITAL_COMMUNITY): Payer: Medicare Other

## 2018-11-12 ENCOUNTER — Encounter (HOSPITAL_COMMUNITY): Payer: Self-pay | Admitting: *Deleted

## 2018-11-12 ENCOUNTER — Other Ambulatory Visit: Payer: Self-pay

## 2018-11-12 ENCOUNTER — Inpatient Hospital Stay (HOSPITAL_COMMUNITY)
Admission: EM | Admit: 2018-11-12 | Discharge: 2018-11-14 | DRG: 312 | Disposition: A | Discharge status: Home Health | Payer: Medicare Other | Attending: Family Medicine | Admitting: Family Medicine

## 2018-11-12 DIAGNOSIS — Z8249 Family history of ischemic heart disease and other diseases of the circulatory system: Secondary | ICD-10-CM

## 2018-11-12 DIAGNOSIS — R402142 Coma scale, eyes open, spontaneous, at arrival to emergency department: Secondary | ICD-10-CM | POA: Diagnosis not present

## 2018-11-12 DIAGNOSIS — M25571 Pain in right ankle and joints of right foot: Secondary | ICD-10-CM | POA: Diagnosis not present

## 2018-11-12 DIAGNOSIS — E785 Hyperlipidemia, unspecified: Secondary | ICD-10-CM | POA: Diagnosis not present

## 2018-11-12 DIAGNOSIS — S42021A Displaced fracture of shaft of right clavicle, initial encounter for closed fracture: Secondary | ICD-10-CM | POA: Diagnosis not present

## 2018-11-12 DIAGNOSIS — Z820 Family history of epilepsy and other diseases of the nervous system: Secondary | ICD-10-CM | POA: Diagnosis not present

## 2018-11-12 DIAGNOSIS — I951 Orthostatic hypotension: Principal | ICD-10-CM | POA: Diagnosis present

## 2018-11-12 DIAGNOSIS — W19XXXD Unspecified fall, subsequent encounter: Secondary | ICD-10-CM | POA: Diagnosis not present

## 2018-11-12 DIAGNOSIS — Z7902 Long term (current) use of antithrombotics/antiplatelets: Secondary | ICD-10-CM

## 2018-11-12 DIAGNOSIS — I447 Left bundle-branch block, unspecified: Secondary | ICD-10-CM | POA: Diagnosis present

## 2018-11-12 DIAGNOSIS — I252 Old myocardial infarction: Secondary | ICD-10-CM | POA: Diagnosis not present

## 2018-11-12 DIAGNOSIS — I493 Ventricular premature depolarization: Secondary | ICD-10-CM | POA: Diagnosis present

## 2018-11-12 DIAGNOSIS — I5022 Chronic systolic (congestive) heart failure: Secondary | ICD-10-CM | POA: Diagnosis present

## 2018-11-12 DIAGNOSIS — Z20828 Contact with and (suspected) exposure to other viral communicable diseases: Secondary | ICD-10-CM | POA: Diagnosis present

## 2018-11-12 DIAGNOSIS — Z79890 Hormone replacement therapy (postmenopausal): Secondary | ICD-10-CM | POA: Diagnosis not present

## 2018-11-12 DIAGNOSIS — I429 Cardiomyopathy, unspecified: Secondary | ICD-10-CM | POA: Diagnosis present

## 2018-11-12 DIAGNOSIS — H353130 Nonexudative age-related macular degeneration, bilateral, stage unspecified: Secondary | ICD-10-CM | POA: Diagnosis present

## 2018-11-12 DIAGNOSIS — R402362 Coma scale, best motor response, obeys commands, at arrival to emergency department: Secondary | ICD-10-CM | POA: Diagnosis present

## 2018-11-12 DIAGNOSIS — S42031A Displaced fracture of lateral end of right clavicle, initial encounter for closed fracture: Secondary | ICD-10-CM | POA: Diagnosis present

## 2018-11-12 DIAGNOSIS — W19XXXA Unspecified fall, initial encounter: Secondary | ICD-10-CM | POA: Diagnosis not present

## 2018-11-12 DIAGNOSIS — S42001D Fracture of unspecified part of right clavicle, subsequent encounter for fracture with routine healing: Secondary | ICD-10-CM | POA: Diagnosis not present

## 2018-11-12 DIAGNOSIS — Y92512 Supermarket, store or market as the place of occurrence of the external cause: Secondary | ICD-10-CM | POA: Diagnosis not present

## 2018-11-12 DIAGNOSIS — Z8781 Personal history of (healed) traumatic fracture: Secondary | ICD-10-CM

## 2018-11-12 DIAGNOSIS — R55 Syncope and collapse: Secondary | ICD-10-CM | POA: Diagnosis present

## 2018-11-12 DIAGNOSIS — I110 Hypertensive heart disease with heart failure: Secondary | ICD-10-CM | POA: Diagnosis present

## 2018-11-12 DIAGNOSIS — M810 Age-related osteoporosis without current pathological fracture: Secondary | ICD-10-CM | POA: Diagnosis not present

## 2018-11-12 DIAGNOSIS — E039 Hypothyroidism, unspecified: Secondary | ICD-10-CM

## 2018-11-12 DIAGNOSIS — Z79891 Long term (current) use of opiate analgesic: Secondary | ICD-10-CM

## 2018-11-12 DIAGNOSIS — W1839XA Other fall on same level, initial encounter: Secondary | ICD-10-CM | POA: Diagnosis present

## 2018-11-12 DIAGNOSIS — S42001A Fracture of unspecified part of right clavicle, initial encounter for closed fracture: Secondary | ICD-10-CM | POA: Diagnosis not present

## 2018-11-12 DIAGNOSIS — I2510 Atherosclerotic heart disease of native coronary artery without angina pectoris: Secondary | ICD-10-CM | POA: Diagnosis present

## 2018-11-12 DIAGNOSIS — S0990XA Unspecified injury of head, initial encounter: Secondary | ICD-10-CM | POA: Diagnosis not present

## 2018-11-12 DIAGNOSIS — Z955 Presence of coronary angioplasty implant and graft: Secondary | ICD-10-CM

## 2018-11-12 DIAGNOSIS — Z79899 Other long term (current) drug therapy: Secondary | ICD-10-CM

## 2018-11-12 DIAGNOSIS — S93401A Sprain of unspecified ligament of right ankle, initial encounter: Secondary | ICD-10-CM | POA: Diagnosis not present

## 2018-11-12 DIAGNOSIS — R402252 Coma scale, best verbal response, oriented, at arrival to emergency department: Secondary | ICD-10-CM | POA: Diagnosis present

## 2018-11-12 LAB — URINALYSIS, ROUTINE W REFLEX MICROSCOPIC
Bacteria, UA: NONE SEEN
Bilirubin Urine: NEGATIVE
Glucose, UA: NEGATIVE mg/dL
Hgb urine dipstick: NEGATIVE
Ketones, ur: NEGATIVE mg/dL
Nitrite: NEGATIVE
Protein, ur: NEGATIVE mg/dL
Specific Gravity, Urine: 1.011 (ref 1.005–1.030)
pH: 7 (ref 5.0–8.0)

## 2018-11-12 LAB — CBC
HCT: 41.1 % (ref 36.0–46.0)
Hemoglobin: 13.4 g/dL (ref 12.0–15.0)
MCH: 32.8 pg (ref 26.0–34.0)
MCHC: 32.6 g/dL (ref 30.0–36.0)
MCV: 100.7 fL — ABNORMAL HIGH (ref 80.0–100.0)
Platelets: 223 10*3/uL (ref 150–400)
RBC: 4.08 MIL/uL (ref 3.87–5.11)
RDW: 13.8 % (ref 11.5–15.5)
WBC: 7 10*3/uL (ref 4.0–10.5)
nRBC: 0 % (ref 0.0–0.2)

## 2018-11-12 LAB — TROPONIN I (HIGH SENSITIVITY)
Troponin I (High Sensitivity): 5 ng/L (ref <18)
Troponin I (High Sensitivity): 5 ng/L (ref <18)

## 2018-11-12 LAB — BASIC METABOLIC PANEL
Anion gap: 12 (ref 5–15)
BUN: 17 mg/dL (ref 8–23)
CO2: 22 mmol/L (ref 22–32)
Calcium: 9.6 mg/dL (ref 8.9–10.3)
Chloride: 105 mmol/L (ref 98–111)
Creatinine, Ser: 0.94 mg/dL (ref 0.44–1.00)
GFR calc Af Amer: >60 mL/min (ref >60)
GFR calc non Af Amer: 56 mL/min — ABNORMAL LOW (ref >60)
Glucose, Bld: 160 mg/dL — ABNORMAL HIGH (ref 70–99)
Potassium: 3.6 mmol/L (ref 3.5–5.1)
Sodium: 139 mmol/L (ref 135–145)

## 2018-11-12 LAB — SARS CORONAVIRUS 2 BY RT PCR (HOSPITAL ORDER, PERFORMED IN ~~LOC~~ HOSPITAL LAB): SARS Coronavirus 2: NEGATIVE

## 2018-11-12 LAB — CBG MONITORING, ED: Glucose-Capillary: 98 mg/dL (ref 70–99)

## 2018-11-12 LAB — BRAIN NATRIURETIC PEPTIDE: B Natriuretic Peptide: 91 pg/mL (ref 0.0–100.0)

## 2018-11-12 MED ORDER — SODIUM CHLORIDE 0.9% FLUSH
3.0000 mL | Freq: Two times a day (BID) | INTRAVENOUS | Status: DC
  Administered 2018-11-13: 3 mL via INTRAVENOUS

## 2018-11-12 MED ORDER — ONDANSETRON HCL 4 MG PO TABS: 4.0000 mg | ORAL_TABLET | Freq: Four times a day (QID) | ORAL | Status: DC | PRN

## 2018-11-12 MED ORDER — CALCIUM CARBONATE-VITAMIN D 500-200 MG-UNIT PO TABS
1.0000 | ORAL_TABLET | Freq: Every day | ORAL | Status: DC
  Administered 2018-11-12 – 2018-11-13 (×2): 1 via ORAL
  Filled 2018-11-12 (×2): qty 1

## 2018-11-12 MED ORDER — ACETAMINOPHEN 325 MG PO TABS: 650.0000 mg | ORAL_TABLET | Freq: Four times a day (QID) | ORAL | Status: DC | PRN

## 2018-11-12 MED ORDER — HYDROCODONE-ACETAMINOPHEN 5-325 MG PO TABS
1.0000 | ORAL_TABLET | Freq: Once | ORAL | Status: AC
  Administered 2018-11-12: 1 via ORAL
  Filled 2018-11-12: qty 1

## 2018-11-12 MED ORDER — VITAMIN D 25 MCG (1000 UNIT) PO TABS
1000.0000 [IU] | ORAL_TABLET | Freq: Every day | ORAL | Status: DC
  Administered 2018-11-13 – 2018-11-14 (×2): 1000 [IU] via ORAL
  Filled 2018-11-12 (×2): qty 1

## 2018-11-12 MED ORDER — NITROGLYCERIN 0.4 MG SL SUBL: 0.4000 mg | SUBLINGUAL_TABLET | SUBLINGUAL | Status: DC | PRN

## 2018-11-12 MED ORDER — METHOCARBAMOL 500 MG PO TABS: 500.0000 mg | ORAL_TABLET | Freq: Three times a day (TID) | ORAL | Status: DC | PRN

## 2018-11-12 MED ORDER — OMEGA-3-ACID ETHYL ESTERS 1 G PO CAPS
1.0000 g | ORAL_CAPSULE | Freq: Every day | ORAL | Status: DC
  Administered 2018-11-13 – 2018-11-14 (×2): 1 g via ORAL
  Filled 2018-11-12 (×2): qty 1

## 2018-11-12 MED ORDER — OXYCODONE-ACETAMINOPHEN 5-325 MG PO TABS
1.0000 | ORAL_TABLET | ORAL | Status: DC | PRN
  Administered 2018-11-13: 1 via ORAL
  Filled 2018-11-12: qty 1

## 2018-11-12 MED ORDER — ONDANSETRON HCL 4 MG/2ML IJ SOLN: 4.0000 mg | Freq: Four times a day (QID) | INTRAMUSCULAR | Status: DC | PRN

## 2018-11-12 MED ORDER — CLOPIDOGREL BISULFATE 75 MG PO TABS
75.0000 mg | ORAL_TABLET | Freq: Every day | ORAL | Status: DC
  Administered 2018-11-13 – 2018-11-14 (×2): 75 mg via ORAL
  Filled 2018-11-12 (×2): qty 1

## 2018-11-12 MED ORDER — MORPHINE SULFATE (PF) 2 MG/ML IV SOLN: 0.5000 mg | INTRAVENOUS | Status: DC | PRN

## 2018-11-12 MED ORDER — ENOXAPARIN SODIUM 40 MG/0.4ML ~~LOC~~ SOLN
40.0000 mg | Freq: Every day | SUBCUTANEOUS | Status: DC
  Administered 2018-11-12 – 2018-11-13 (×2): 40 mg via SUBCUTANEOUS
  Filled 2018-11-12 (×2): qty 0.4

## 2018-11-12 MED ORDER — SODIUM CHLORIDE 0.9 % IV SOLN
INTRAVENOUS | Status: DC
  Administered 2018-11-12 – 2018-11-13 (×3): via INTRAVENOUS

## 2018-11-12 MED ORDER — CARVEDILOL 3.125 MG PO TABS
3.1250 mg | ORAL_TABLET | Freq: Two times a day (BID) | ORAL | Status: DC
  Administered 2018-11-13 – 2018-11-14 (×4): 3.125 mg via ORAL
  Filled 2018-11-12 (×4): qty 1

## 2018-11-12 MED ORDER — VITAMIN B-12 1000 MCG PO TABS
2000.0000 ug | ORAL_TABLET | Freq: Every day | ORAL | Status: DC
  Administered 2018-11-13 – 2018-11-14 (×2): 2000 ug via ORAL
  Filled 2018-11-12 (×2): qty 2

## 2018-11-12 MED ORDER — SODIUM CHLORIDE 0.9% FLUSH: 3.0000 mL | Freq: Once | INTRAVENOUS | Status: DC

## 2018-11-12 MED ORDER — LEVOTHYROXINE SODIUM 75 MCG PO TABS
75.0000 ug | ORAL_TABLET | Freq: Every day | ORAL | Status: DC
  Administered 2018-11-13 – 2018-11-14 (×2): 75 ug via ORAL
  Filled 2018-11-12 (×2): qty 1

## 2018-11-12 MED ORDER — PROSIGHT PO TABS
1.0000 | ORAL_TABLET | Freq: Two times a day (BID) | ORAL | Status: DC
  Administered 2018-11-13 – 2018-11-14 (×3): 1 via ORAL
  Filled 2018-11-12 (×3): qty 1

## 2018-11-12 MED ORDER — ROSUVASTATIN CALCIUM 20 MG PO TABS
40.0000 mg | ORAL_TABLET | Freq: Every day | ORAL | Status: DC
  Administered 2018-11-12 – 2018-11-13 (×2): 40 mg via ORAL
  Filled 2018-11-12 (×2): qty 2

## 2018-11-12 MED ORDER — ACETAMINOPHEN 650 MG RE SUPP: 650.0000 mg | Freq: Four times a day (QID) | RECTAL | Status: DC | PRN

## 2018-11-12 MED ORDER — VENLAFAXINE HCL ER 150 MG PO CP24
150.0000 mg | ORAL_CAPSULE | Freq: Every day | ORAL | Status: DC
  Administered 2018-11-13 – 2018-11-14 (×2): 150 mg via ORAL
  Filled 2018-11-12 (×2): qty 1

## 2018-11-12 NOTE — ED Notes (Signed)
ED TO INPATIENT HANDOFF REPORT  Name/Age/Gender Serena Colonel 85 y.o. female  Code Status Code Status History    Date Active Date Inactive Code Status Order ID Comments User Context   02/01/2018 0223 02/11/2018 1848 Full Code [id number]  Toy Baker, MD Inpatient   07/14/2011 0016 07/15/2011 1400 Full Code [id number]  Toy Baker, MD Inpatient   Advance Care Planning Activity    Advance Directive Documentation     Most Recent Value  Type of Advance Directive  Healthcare Power of Attorney, Living will  Pre-existing out of facility DNR order (yellow form or pink MOST form)  -  "MOST" Form in Place?  -      Home/SNF/Other Home  Chief Complaint  Faint / Chest Pain / Right Arm Pain   Level of Care/Admitting Diagnosis ED Disposition    ED Disposition Condition [place]: [place] [100102]  Level of Care: Telemetry [5]  Admit to tele based on following criteria: Eval of Syncope  Covid Evaluation: N/A  Diagnosis: Syncope [962952]  Admitting Physician: Ivor Costa [[id number]]  Attending Physician: Ivor Costa [[id number]]  PT Class (Do Not Modify): Observation [104]  PT Acc Code (Do Not Modify): Observation [10022]       Medical History Past Medical History:  Diagnosis Date  . Coronary artery disease    POST PTCA AND STENTING OF HER RIGHT CORONARY ARTERY  . Hypothyroidism   . LAD stenosis    MODERATE 50-60% STENOSIS  . Osteoporosis     Allergies No Known Allergies  IV Location/Drains/Wounds Patient Lines/Drains/Airways Status   Active Line/Drains/Airways    Name:   Placement date:   Placement time:   Site:   Days:   Peripheral IV 11/12/18 Left;Distal Forearm   11/12/18    1729    Forearm   less than 1   Incision (Closed) 02/07/18 Back Mid;Upper   02/07/18    -     278          Labs/Imaging Results for orders placed or performed during the hospital encounter of 11/12/18 (from the past 48 hour(s))  Basic metabolic  panel     Status: Abnormal   Collection Time: 11/12/18  5:13 PM  Result Value Ref Range   Sodium 139 135 - 145 mmol/L   Potassium 3.6 3.5 - 5.1 mmol/L   Chloride 105 98 - 111 mmol/L   CO2 22 22 - 32 mmol/L   Glucose, Bld 160 (H) 70 - 99 mg/dL   BUN 17 8 - 23 mg/dL   Creatinine, Ser 0.94 0.44 - 1.00 mg/dL   Calcium 9.6 8.9 - 10.3 mg/dL   GFR calc non Af Amer 56 (L) >60 mL/min   GFR calc Af Amer >60 >60 mL/min   Anion gap 12 5 - 15    Comment: Performed at [facility], [place] [address], [place], [place] [address]  CBC     Status: Abnormal   Collection Time: 11/12/18  5:13 PM  Result Value Ref Range   WBC 7.0 4.0 - 10.5 K/uL   RBC 4.08 3.87 - 5.11 MIL/uL   Hemoglobin 13.4 12.0 - 15.0 g/dL   HCT 41.1 36.0 - 46.0 %   MCV 100.7 (H) 80.0 - 100.0 fL   MCH 32.8 26.0 - 34.0 pg   MCHC 32.6 30.0 - 36.0 g/dL   RDW 13.8 11.5 - 15.5 %   Platelets 223 150 - 400 K/uL   nRBC 0.0  0.0 - 0.2 %    Comment: Performed at [facility], [place] [address] [place], [place] [address]  Troponin I (High Sensitivity)     Status: None   Collection Time: 11/12/18  5:29 PM  Result Value Ref Range   Troponin I (High Sensitivity) 5.0 <18 ng/L    Comment: (NOTE) Elevated high sensitivity troponin I (hsTnI) values and significant  changes across serial measurements may suggest ACS but many other  chronic and acute conditions are known to elevate hsTnI results.  Refer to the "Links" section for chest pain algorithms and additional  guidance. Performed at [facility], [place] [address], [place], [place] [address]   CBG monitoring, ED     Status: None   Collection Time: 11/12/18  6:24 PM  Result Value Ref Range   Glucose-Capillary 98 70 - 99 mg/dL  SARS Coronavirus 2 (CEPHEID - Performed in [place] hospital lab), Hosp Order     Status: None   Collection Time: 11/12/18  6:33 PM   Specimen: Nasopharyngeal Swab  Result Value Ref Range   SARS Coronavirus 2  NEGATIVE NEGATIVE    Comment: (NOTE) If result is NEGATIVE SARS-CoV-2 target nucleic acids are NOT DETECTED. The SARS-CoV-2 RNA is generally detectable in upper and lower  respiratory specimens during the acute phase of infection. The lowest  concentration of SARS-CoV-2 viral copies this assay can detect is 250  copies / mL. A negative result does not preclude SARS-CoV-2 infection  and should not be used as the sole basis for treatment or other  patient management decisions.  A negative result may occur with  improper specimen collection / handling, submission of specimen other  than nasopharyngeal swab, presence of viral mutation(s) within the  areas targeted by this assay, and inadequate number of viral copies  (<250 copies / mL). A negative result must be combined with clinical  observations, patient history, and epidemiological information. If result is POSITIVE SARS-CoV-2 target nucleic acids are DETECTED. The SARS-CoV-2 RNA is generally detectable in upper and lower  respiratory specimens dur ing the acute phase of infection.  Positive  results are indicative of active infection with SARS-CoV-2.  Clinical  correlation with patient history and other diagnostic information is  necessary to determine patient infection status.  Positive results do  not rule out bacterial infection or co-infection with other viruses. If result is PRESUMPTIVE POSTIVE SARS-CoV-2 nucleic acids MAY BE PRESENT.   A presumptive positive result was obtained on the submitted specimen  and confirmed on repeat testing.  While 2019 novel coronavirus  (SARS-CoV-2) nucleic acids may be present in the submitted sample  additional confirmatory testing may be necessary for epidemiological  and / or clinical management purposes  to differentiate between  SARS-CoV-2 and other Sarbecovirus currently known to infect humans.  If clinically indicated additional testing with an alternate test  methodology [phone]) is  advised. The SARS-CoV-2 RNA is generally  detectable in upper and lower respiratory sp ecimens during the acute  phase of infection. The expected result is Negative. Fact Sheet for Patients:  StrictlyIdeas.no Fact Sheet for Healthcare Providers: BankingDealers.co.za This test is not yet approved or cleared by the [place] FDA and has been authorized for detection and/or diagnosis of SARS-CoV-2 by FDA under an Emergency Use Authorization (EUA).  This EUA will remain in effect (meaning this test can be used) for the duration of the COVID-19 declaration under Section 564(b)(1) of the Act, 21 U.S.C. section 360bbb-3(b)(1), unless the  authorization is terminated or revoked sooner. Performed at [facility], [place] [address], [place], [place] [address]   Urinalysis, Routine w reflex microscopic     Status: Abnormal   Collection Time: 11/12/18  8:19 PM  Result Value Ref Range   Color, Urine YELLOW YELLOW   APPearance CLEAR CLEAR   Specific Gravity, Urine 1.011 1.005 - 1.030   pH 7.0 5.0 - 8.0   Glucose, UA NEGATIVE NEGATIVE mg/dL   Hgb urine dipstick NEGATIVE NEGATIVE   Bilirubin Urine NEGATIVE NEGATIVE   Ketones, ur NEGATIVE NEGATIVE mg/dL   Protein, ur NEGATIVE NEGATIVE mg/dL   Nitrite NEGATIVE NEGATIVE   Leukocytes,Ua SMALL (A) NEGATIVE   RBC / HPF 0-5 0 - 5 RBC/hpf   WBC, UA 11-20 0 - 5 WBC/hpf   Bacteria, UA NONE SEEN NONE SEEN   Squamous Epithelial / LPF 0-5 0 - 5   Mucus PRESENT     Comment: Performed at [facility], [place] [address], [place], [place] [address]  Troponin I (High Sensitivity)     Status: None   Collection Time: 11/12/18  8:19 PM  Result Value Ref Range   Troponin I (High Sensitivity) 5.0 <18 ng/L    Comment: (NOTE) Elevated high sensitivity troponin I (hsTnI) values and significant  changes across serial measurements may suggest ACS but many other  chronic and acute  conditions are known to elevate hsTnI results.  Refer to the "Links" section for chest pain algorithms and additional  guidance. Performed at [facility], [place] [address], [place], [place] [address]    Dg Shoulder Right  Result Date: 11/12/2018 CLINICAL DATA:  Pain after fall earlier today EXAM: RIGHT SHOULDER - 2+ VIEW COMPARISON:  Chest x-ray February 04, 2017 FINDINGS: There is a mid to distal clavicular fracture not seen on the comparison x-ray. Limited views of the right chest are normal. Calcification superior to the humeral head is likely due to calcific tendinosis of the rotator cuff. Mild irregularity at the rotator cuff insertion site is likely degenerative. The patient is status post vertebroplasty of a midthoracic vertebral body. No other abnormalities. IMPRESSION: 1. Fracture in the mid to distal clavicle. This was not seen in October of 2019 and is worrisome for an acute fracture given history. No other abnormalities. Electronically Signed   By: Dorise Bullion III M.D   On: 11/12/2018 17:59   Dg Ankle 2 Views Right  Result Date: 11/12/2018 CLINICAL DATA:  Pain after fall EXAM: RIGHT ANKLE - 2 VIEW COMPARISON:  None. FINDINGS: There is no evidence of fracture, dislocation, or joint effusion. There is no evidence of arthropathy or other focal bone abnormality. Soft tissues are unremarkable. IMPRESSION: Negative. Electronically Signed   By: Dorise Bullion III M.D   On: 11/12/2018 17:59   Ct Head Wo Contrast  Result Date: 11/12/2018 CLINICAL DATA:  Recent syncopal episode with head injury, initial encounter EXAM: CT HEAD WITHOUT CONTRAST TECHNIQUE: Contiguous axial images were obtained from the base of the skull through the vertex without intravenous contrast. COMPARISON:  02/01/2018 FINDINGS: Brain: Mild atrophic and chronic white matter ischemic changes are noted. No acute hemorrhage, acute infarction or space-occupying mass lesion are seen. Vascular: No hyperdense  vessel or unexpected calcification. Skull: Normal. Negative for fracture or focal lesion. Sinuses/Orbits: Chronic opacification of the right maxillary antrum is seen. Other: None IMPRESSION: Chronic atrophic and ischemic changes without acute abnormality. Electronically Signed   By: Inez Catalina M.D.   On:  11/12/2018 18:08    Pending Labs Unresulted Labs (From admission, onward)    Start     Ordered   11/12/18 2105  Brain natriuretic peptide  ONCE - STAT,   STAT     11/12/18 2104   Signed and Held  Basic metabolic panel  Tomorrow morning,   R     Signed and Held   Signed and Held  CBC  Tomorrow morning,   R     Signed and Held          Vitals/Pain Today's Vitals   11/12/18 1930 11/12/18 2000 11/12/18 2100 11/12/18 2130  BP: (!) 144/77 (!) 147/83 138/75 (!) 142/71  Pulse:  76  76  Resp:  15 20 19   Temp:      TempSrc:      SpO2: 99% 97%  100%  PainSc:        Isolation Precautions No active isolations  Medications Medications  sodium chloride flush (NS) 0.9 % injection 3 mL (3 mLs Intravenous Not Given 11/12/18 1718)  0.9 %  sodium chloride infusion ( Intravenous Rate/Dose Change 11/12/18 2127)  HYDROcodone-acetaminophen (NORCO/VICODIN) 5-325 MG per tablet 1 tablet (1 tablet Oral Given 11/12/18 2039)    Mobility walks

## 2018-11-12 NOTE — ED Provider Notes (Signed)
[facility] DEPT Provider Note   CSN: [id number] Arrival date & time: 11/12/18  1658     History   Chief Complaint Chief Complaint  Patient presents with  . Chest Pain  . Loss of Consciousness  . Fall    HPI Susan [name] is a 85 y.o. female.     85 year old female who presents after having a syncopal event just prior to arrival.  Patient states that she has been in her baseline state of health until today when she was shopping she became lightheaded and dizzy.  Sat down on the commode to try to rest and became diaphoretic.  Denied any associated chest pain or chest pressure.  She was not short of breath.  She not had any leg pain or swelling.  No recent changes to her medication.  No volume loss.  When she went to stand up and walk she fell onto the right shoulder.  Did strike her head but denies any current headache.  Does take Plavix.  Now complains of sharp right shoulder pain that is worse with movement.  Also notes sharp right ankle pain that is worse with movement remaining still.  Drove herself here.     Past Medical History:  Diagnosis Date  . Coronary artery disease    POST PTCA AND STENTING OF HER RIGHT CORONARY ARTERY  . Hypothyroidism   . LAD stenosis    MODERATE 50-60% STENOSIS  . Osteoporosis     Patient Active Problem List   Diagnosis Date Noted  . Chronic systolic CHF (congestive heart failure) ([place]) 02/11/2018  . Coronary artery disease   . Dyspnea on exertion 10/08/2017  . Cardiomyopathy ([place]) 10/08/2017  . Posterior vitreous detachment of both eyes 04/28/2016  . Hyperlipidemia 07/18/2014  . Nonexudative age-related macular degeneration 07/17/2014  . Status post intraocular lens implant 07/17/2014  . Pseudophakia 10/25/2013  . Anxiety 10/11/2013  . Mechanical complication of intraocular lens 08/12/2012  . Nuclear sclerosis 08/12/2012  . Posterior capsule opacification, right 08/12/2012  . Macular degeneration  08/04/2011  . Pituitary adenoma ([place]) 07/15/2011  . Arteriosclerosis of coronary artery   . Hypothyroidism     Past Surgical History:  Procedure Laterality Date  . APPENDECTOMY  1970s?   "ruptured"   . BACK SURGERY    . CATARACT EXTRACTION W/ INTRAOCULAR LENS  IMPLANT, BILATERAL Bilateral   . CORONARY ANGIOPLASTY WITH STENT PLACEMENT  ~ 2009   "2 stents"  . FRACTURE SURGERY    . IR VERTEBROPLASTY CERV/THOR BX INC UNI/BIL INC/INJECT/IMAGING  02/07/2018  . KNEE ARTHROSCOPY Right     torn meniscus  . LUMBAR LAMINECTOMY  1978  . OVARIAN CYST SURGERY  1970s?   "ruptured"   . RIGHT/LEFT HEART CATH AND CORONARY ANGIOGRAPHY N/A 10/08/2017   Procedure: RIGHT/LEFT HEART CATH AND CORONARY ANGIOGRAPHY;  Surgeon: Nelva Bush, MD;  Location: [facility];  Service: Cardiovascular;  Laterality: N/A;  . WRIST FRACTURE SURGERY Bilateral    "2 surgeries for 2 breaks on left side; 1 surgery for 1 break on right wrist"     OB History   No obstetric history on file.      Home Medications    Prior to Admission medications   Medication Sig Start Date End Date Taking? Authorizing Provider  acetaminophen (TYLENOL) 325 MG tablet Take 2 tablets (650 mg total) by mouth every 6 (six) hours as needed for mild pain (or Fever >/= 101). 02/11/18   Annita Brod, MD  Calcium Carbonate-Vitamin D (CALCIUM + D PO) Take 1 tablet by mouth at bedtime.     [provider]  carvedilol (COREG) 3.125 MG tablet Take 1 tablet by mouth twice daily 09/20/18   Nahser, Wonda Cheng, MD  clopidogrel (PLAVIX) 75 MG tablet TAKE 1 TABLET BY MOUTH ONCE DAILY 10/04/17   Nahser, Wonda Cheng, MD  levothyroxine (SYNTHROID, LEVOTHROID) 75 MCG tablet Take 1 tablet by mouth daily. 11/30/17   [provider]  lidocaine (LIDODERM) 5 % Place 1 patch onto the skin daily. Remove & Discard patch within 12 hours or as directed by MD 02/11/18   Annita Brod, MD  multivitamin-lutein [facility]) CAPS capsule Take  1 capsule daily by mouth.    [provider]  nitroGLYCERIN (NITROSTAT) 0.4 MG SL tablet Place 1 tablet (0.4 mg total) under the tongue every 5 (five) minutes as needed. 06/22/11   Nahser, Wonda Cheng, MD  Omega-3 Fatty Acids (FISH OIL) 1200 MG CAPS Take 1,200 mg by mouth at bedtime.    [provider]  oxyCODONE (OXY IR/ROXICODONE) 5 MG immediate release tablet Take 0.5 tablets (2.5 mg total) by mouth every 6 (six) hours as needed for breakthrough pain. 02/11/18   Annita Brod, MD  oxyCODONE (OXYCONTIN) 10 mg 12 hr tablet Take 1 tablet (10 mg total) by mouth daily. 02/11/18   Annita Brod, MD  polyethylene glycol [facility] Floria Raveling) packet Take 17 g by mouth daily. 02/12/18   Annita Brod, MD  rosuvastatin (CRESTOR) 40 MG tablet TAKE 1 TABLET BY MOUTH ONCE DAILY 01/05/18   Nahser, Wonda Cheng, MD  senna (SENOKOT) 8.6 MG TABS tablet Take 1 tablet (8.6 mg total) by mouth 2 (two) times daily. 02/11/18   Annita Brod, MD  venlafaxine XR (EFFEXOR-XR) 150 MG 24 hr capsule TAKE 1 CAPSULE BY MOUTH ONCE DAILY 06/01/18   Nahser, Wonda Cheng, MD  vitamin B-12 (CYANOCOBALAMIN) 1000 MCG tablet Take 2 tablets by mouth daily.    [provider]    Family History Family History  Problem Relation Age of Onset  . Alzheimer's disease Mother   . Heart attack Father   . Aneurysm Sister     Social History Social History   Tobacco Use  . Smoking status: Never Smoker  . Smokeless tobacco: Never Used  Substance Use Topics  . Alcohol use: Yes    Alcohol/week: 1.0 standard drinks    Types: 1 Glasses of wine per week  . Drug use: Never     Allergies   Patient has no known allergies.   Review of Systems Review of Systems  All other systems reviewed and are negative.    Physical Exam Updated Vital Signs BP 131/77 (BP Location: Left Arm)   Pulse 83   Temp 97.8 F (36.6 C) (Oral)   Resp 20   SpO2 100%   Physical Exam Vitals signs and nursing note  reviewed.  Constitutional:      General: She is not in acute distress.    Appearance: Normal appearance. She is well-developed. She is not toxic-appearing.  HENT:     Head: Normocephalic and atraumatic.  Eyes:     General: Lids are normal.     Conjunctiva/sclera: Conjunctivae normal.     Pupils: Pupils are equal, round, and reactive to light.  Neck:     Musculoskeletal: Normal range of motion and neck supple.     Thyroid: No thyroid mass.     Trachea: No tracheal deviation.  Cardiovascular:     Rate and Rhythm: Normal rate and regular rhythm.     Heart sounds: Normal heart sounds. No murmur. No gallop.   Pulmonary:     Effort: Pulmonary effort is normal. No respiratory distress.     Breath sounds: Normal breath sounds. No stridor. No decreased breath sounds, wheezing, rhonchi or rales.  Abdominal:     General: Bowel sounds are normal. There is no distension.     Palpations: Abdomen is soft.     Tenderness: There is no abdominal tenderness. There is no rebound.  Musculoskeletal:     Right shoulder: She exhibits decreased range of motion and tenderness.     Right ankle: She exhibits no swelling. Tenderness.       Feet:  Skin:    General: Skin is warm and dry.     Findings: No abrasion or rash.  Neurological:     Mental Status: She is alert and oriented to person, place, and time.     GCS: GCS eye subscore is 4. GCS verbal subscore is 5. GCS motor subscore is 6.     Cranial Nerves: No cranial nerve deficit.     Sensory: No sensory deficit.  Psychiatric:        Attention and Perception: Attention normal.        Speech: Speech normal.        Behavior: Behavior normal.      ED Treatments / Results  Labs (all labs ordered are listed, but only abnormal results are displayed) Labs Reviewed  SARS CORONAVIRUS 2 (HOSPITAL ORDER, [facility])  BASIC METABOLIC PANEL  CBC  URINALYSIS, ROUTINE W REFLEX MICROSCOPIC  CBG MONITORING, ED  TROPONIN I (HIGH  SENSITIVITY)    EKG EKG Interpretation  Date/Time:  Saturday November 12 2018 17:09:16 EDT Ventricular Rate:  84 PR Interval:    QRS Duration: 133 QT Interval:  399 QTC Calculation: 472 R Axis:   -40 Text Interpretation:  Sinus rhythm Left bundle branch block Baseline wander in lead(s) V1 No significant change since last tracing Confirmed by Lacretia Leigh ([id number]) on 11/12/2018 5:29:42 PM   Radiology No results found.  Procedures Procedures (including critical care time)  Medications Ordered in ED Medications  sodium chloride flush (NS) 0.9 % injection 3 mL (3 mLs Intravenous Not Given 11/12/18 1718)  0.9 %  sodium chloride infusion (has no administration in time range)     Initial Impression / Assessment and Plan / ED Course  I have reviewed the triage vital signs and the nursing notes.  Pertinent labs & imaging results that were available during my care of the patient were reviewed by me and considered in my medical decision making (see chart for details).        Patient COVID negative here.  For troponin negative.  She is not orthostatic.  Does have a clavicle fracture and this was treated with a sling.  Head CT negative.  Ankle injury negative.  Patient states that her current symptoms are similar to when she had a prior MI.  Will consult hospitalist for observation.  Final Clinical Impressions(s) / ED Diagnoses   Final diagnoses:  None    ED Discharge Orders    None       Lacretia Leigh, MD 11/12/18 2021

## 2018-11-12 NOTE — ED Triage Notes (Addendum)
Pt states she passed out in the bathroom at the grocery store and feels like she did before when she had her heart attack and stints. Pt diaphoretic with episode, speaks in sentences and sounds SHOB in triage. She states she drove herself straight to the hospital after episode. Also states she thinks she broke her rt shoulder when she fell

## 2018-11-12 NOTE — H&P (Signed)
History and Physical    Susan [name] NGE:[id number] DOB: 1934-04-11 DOA: 11/12/2018  Referring MD/NP/PA:   PCP: Crist Infante, MD   Patient coming from:  The patient is coming from home.  At baseline, pt is independent for most of ADL.        Chief Complaint: syncope  HPI: Susan [name] is a 85 y.o. female with medical history significant of sCHF with EF of 30%, hyperlipidemia, hypothyroidism, CAD, stent placement, hypothyroidism, anxiety, pituitary adenoma, who presents with syncope.  Patient states that she passed out and fell in grocery store at about 4:30. Patient states that before passing out, she felt lightheaded and dizziness.  She injured her right shoulder and her right ankle causing severe pain in both location.  The pain is constant, severe, sharp, nonradiating, aggravated by movement. She denies unilateral weakness or numbness in extremities.  No facial droop or slurred speech.  No difficulty speaking.  Patient denies chest pain, shortness of breath, cough, fever or chills.  She had one mild loose stool bowel movement earlier, currently no nausea vomiting, diarrhea or abdominal pain.  No symptoms of UTI.  ED Course: pt was found to have high sensitive troponin 5.0, negative COVID-19, pending urinalysis, WBC 7.0, electrolytes renal function okay, temperature normal, blood pressure 143/83, no tachycardia, no tachypnea, oxygen saturation 99% on room air.  CT head is negative for acute intracranial abnormalities.  X-ray of her right ankle is negative for bony fracture.  X-ray of right shoulder showed fracture in the mid to distal clavicle. Pt is placed on telemetry bed for observation.   Review of Systems:   General: no fevers, chills, no body weight gain, has fatigue HEENT: no blurry vision, hearing changes or sore throat Respiratory: no dyspnea, coughing, wheezing CV: no chest pain, no palpitations GI: no nausea, vomiting, abdominal pain, diarrhea, constipation GU: no  dysuria, burning on urination, increased urinary frequency, hematuria  Ext: no leg edema Neuro: no unilateral weakness, numbness, or tingling, no vision change or hearing loss. Has fall and syncope. Skin: no rash, no skin tear. MSK: has pain in right shoulder and right ankle. Heme: No easy bruising.  Travel history: No recent long distant travel.  Allergy: No Known Allergies  Past Medical History:  Diagnosis Date  . Coronary artery disease    POST PTCA AND STENTING OF HER RIGHT CORONARY ARTERY  . Hypothyroidism   . LAD stenosis    MODERATE 50-60% STENOSIS  . Osteoporosis     Past Surgical History:  Procedure Laterality Date  . APPENDECTOMY  1970s?   "ruptured"   . BACK SURGERY    . CATARACT EXTRACTION W/ INTRAOCULAR LENS  IMPLANT, BILATERAL Bilateral   . CORONARY ANGIOPLASTY WITH STENT PLACEMENT  ~ 2009   "2 stents"  . FRACTURE SURGERY    . IR VERTEBROPLASTY CERV/THOR BX INC UNI/BIL INC/INJECT/IMAGING  02/07/2018  . KNEE ARTHROSCOPY Right     torn meniscus  . LUMBAR LAMINECTOMY  1978  . OVARIAN CYST SURGERY  1970s?   "ruptured"   . RIGHT/LEFT HEART CATH AND CORONARY ANGIOGRAPHY N/A 10/08/2017   Procedure: RIGHT/LEFT HEART CATH AND CORONARY ANGIOGRAPHY;  Surgeon: Nelva Bush, MD;  Location: [facility];  Service: Cardiovascular;  Laterality: N/A;  . WRIST FRACTURE SURGERY Bilateral    "2 surgeries for 2 breaks on left side; 1 surgery for 1 break on right wrist"    Social History:  reports that she has never smoked. She has never used smokeless tobacco.  She reports current alcohol use of about 1.0 standard drinks of alcohol per week. She reports that she does not use drugs.  Family History:  Family History  Problem Relation Age of Onset  . Alzheimer's disease Mother   . Heart attack Father   . Aneurysm Sister      Prior to Admission medications   Medication Sig Start Date End Date Taking? Authorizing Provider  acetaminophen (TYLENOL) 325 MG tablet Take 2  tablets (650 mg total) by mouth every 6 (six) hours as needed for mild pain (or Fever >/= 101). 02/11/18   Annita Brod, MD  Calcium Carbonate-Vitamin D (CALCIUM + D PO) Take 1 tablet by mouth at bedtime.     [provider]  carvedilol (COREG) 3.125 MG tablet Take 1 tablet by mouth twice daily 09/20/18   Nahser, Wonda Cheng, MD  clopidogrel (PLAVIX) 75 MG tablet TAKE 1 TABLET BY MOUTH ONCE DAILY 10/04/17   Nahser, Wonda Cheng, MD  levothyroxine (SYNTHROID, LEVOTHROID) 75 MCG tablet Take 1 tablet by mouth daily. 11/30/17   [provider]  lidocaine (LIDODERM) 5 % Place 1 patch onto the skin daily. Remove & Discard patch within 12 hours or as directed by MD 02/11/18   Annita Brod, MD  multivitamin-lutein [facility]) CAPS capsule Take 1 capsule daily by mouth.    [provider]  nitroGLYCERIN (NITROSTAT) 0.4 MG SL tablet Place 1 tablet (0.4 mg total) under the tongue every 5 (five) minutes as needed. 06/22/11   Nahser, Wonda Cheng, MD  Omega-3 Fatty Acids (FISH OIL) 1200 MG CAPS Take 1,200 mg by mouth at bedtime.    [provider]  oxyCODONE (OXY IR/ROXICODONE) 5 MG immediate release tablet Take 0.5 tablets (2.5 mg total) by mouth every 6 (six) hours as needed for breakthrough pain. 02/11/18   Annita Brod, MD  oxyCODONE (OXYCONTIN) 10 mg 12 hr tablet Take 1 tablet (10 mg total) by mouth daily. 02/11/18   Annita Brod, MD  polyethylene glycol [facility] Floria Raveling) packet Take 17 g by mouth daily. 02/12/18   Annita Brod, MD  rosuvastatin (CRESTOR) 40 MG tablet TAKE 1 TABLET BY MOUTH ONCE DAILY 01/05/18   Nahser, Wonda Cheng, MD  senna (SENOKOT) 8.6 MG TABS tablet Take 1 tablet (8.6 mg total) by mouth 2 (two) times daily. 02/11/18   Annita Brod, MD  venlafaxine XR (EFFEXOR-XR) 150 MG 24 hr capsule TAKE 1 CAPSULE BY MOUTH ONCE DAILY 06/01/18   Nahser, Wonda Cheng, MD  vitamin B-12 (CYANOCOBALAMIN) 1000 MCG tablet Take 2 tablets by mouth daily.     [provider]    Physical Exam: Vitals:   11/12/18 1930 11/12/18 2000 11/12/18 2100 11/12/18 2130  BP: (!) 144/77 (!) 147/83 138/75 (!) 142/71  Pulse:  76  76  Resp:  15 20 19   Temp:      TempSrc:      SpO2: 99% 97%  100%   General: Not in acute distress HEENT:       Eyes: PERRL, EOMI, no scleral icterus.       ENT: No discharge from the ears and nose, no pharynx injection, no tonsillar enlargement.        Neck: No JVD, no bruit, no mass felt. Heme: No neck lymph node enlargement. Cardiac: S1/S2, RRR, No murmurs, No gallops or rubs. Respiratory: No rales, wheezing, rhonchi or rubs. GI: Soft, nondistended, nontender, no rebound pain, no organomegaly, BS present. GU: No hematuria Ext: No pitting  leg edema bilaterally. 2+DP/PT pulse bilaterally. Musculoskeletal: has tenderness in right shoulder and right ankle.  Skin: No rashes.  Neuro: Alert, oriented X3, cranial nerves II-XII grossly intact, moves all extremities normally. Psych: Patient is not psychotic, no suicidal or hemocidal ideation.  Labs on Admission: I have personally reviewed following labs and imaging studies  CBC: Recent Labs  Lab 11/12/18 1713  WBC 7.0  HGB 13.4  HCT 41.1  MCV 100.7*  PLT 440   Basic Metabolic Panel: Recent Labs  Lab 11/12/18 1713  NA 139  K 3.6  CL 105  CO2 22  GLUCOSE 160*  BUN 17  CREATININE 0.94  CALCIUM 9.6   GFR: CrCl cannot be calculated (Unknown ideal weight.). Liver Function Tests: No results for input(s): AST, ALT, ALKPHOS, BILITOT, PROT, ALBUMIN in the last 168 hours. No results for input(s): LIPASE, AMYLASE in the last 168 hours. No results for input(s): AMMONIA in the last 168 hours. Coagulation Profile: No results for input(s): INR, PROTIME in the last 168 hours. Cardiac Enzymes: No results for input(s): CKTOTAL, CKMB, CKMBINDEX, TROPONINI in the last 168 hours. BNP (last 3 results) No results for input(s): PROBNP in the last 8760 hours. HbA1C:  No results for input(s): HGBA1C in the last 72 hours. CBG: Recent Labs  Lab 11/12/18 1824  GLUCAP 98   Lipid Profile: No results for input(s): CHOL, HDL, LDLCALC, TRIG, CHOLHDL, LDLDIRECT in the last 72 hours. Thyroid Function Tests: No results for input(s): TSH, T4TOTAL, FREET4, T3FREE, THYROIDAB in the last 72 hours. Anemia Panel: No results for input(s): VITAMINB12, FOLATE, FERRITIN, TIBC, IRON, RETICCTPCT in the last 72 hours. Urine analysis:    Component Value Date/Time   COLORURINE YELLOW 11/12/2018 2019   APPEARANCEUR CLEAR 11/12/2018 2019   LABSPEC 1.011 11/12/2018 2019   PHURINE 7.0 11/12/2018 2019   GLUCOSEU NEGATIVE 11/12/2018 2019   HGBUR NEGATIVE 11/12/2018 2019   BILIRUBINUR NEGATIVE 11/12/2018 2019   KETONESUR NEGATIVE 11/12/2018 2019   PROTEINUR NEGATIVE 11/12/2018 2019   UROBILINOGEN 0.2 07/13/2011 2036   NITRITE NEGATIVE 11/12/2018 2019   LEUKOCYTESUR SMALL (A) 11/12/2018 2019   Sepsis Labs: @LABRCNTIP (procalcitonin:4,lacticidven:4) ) Recent Results (from the past 240 hour(s))  SARS Coronavirus 2 (CEPHEID - Performed in [place] hospital lab), Hosp Order     Status: None   Collection Time: 11/12/18  6:33 PM   Specimen: Nasopharyngeal Swab  Result Value Ref Range Status   SARS Coronavirus 2 NEGATIVE NEGATIVE Final    Comment: (NOTE) If result is NEGATIVE SARS-CoV-2 target nucleic acids are NOT DETECTED. The SARS-CoV-2 RNA is generally detectable in upper and lower  respiratory specimens during the acute phase of infection. The lowest  concentration of SARS-CoV-2 viral copies this assay can detect is 250  copies / mL. A negative result does not preclude SARS-CoV-2 infection  and should not be used as the sole basis for treatment or other  patient management decisions.  A negative result may occur with  improper specimen collection / handling, submission of specimen other  than nasopharyngeal swab, presence of viral mutation(s) within the  areas  targeted by this assay, and inadequate number of viral copies  (<250 copies / mL). A negative result must be combined with clinical  observations, patient history, and epidemiological information. If result is POSITIVE SARS-CoV-2 target nucleic acids are DETECTED. The SARS-CoV-2 RNA is generally detectable in upper and lower  respiratory specimens dur ing the acute phase of infection.  Positive  results are indicative of active infection with  SARS-CoV-2.  Clinical  correlation with patient history and other diagnostic information is  necessary to determine patient infection status.  Positive results do  not rule out bacterial infection or co-infection with other viruses. If result is PRESUMPTIVE POSTIVE SARS-CoV-2 nucleic acids MAY BE PRESENT.   A presumptive positive result was obtained on the submitted specimen  and confirmed on repeat testing.  While 2019 novel coronavirus  (SARS-CoV-2) nucleic acids may be present in the submitted sample  additional confirmatory testing may be necessary for epidemiological  and / or clinical management purposes  to differentiate between  SARS-CoV-2 and other Sarbecovirus currently known to infect humans.  If clinically indicated additional testing with an alternate test  methodology [phone]) is advised. The SARS-CoV-2 RNA is generally  detectable in upper and lower respiratory sp ecimens during the acute  phase of infection. The expected result is Negative. Fact Sheet for Patients:  StrictlyIdeas.no Fact Sheet for Healthcare Providers: BankingDealers.co.za This test is not yet approved or cleared by the [place] FDA and has been authorized for detection and/or diagnosis of SARS-CoV-2 by FDA under an Emergency Use Authorization (EUA).  This EUA will remain in effect (meaning this test can be used) for the duration of the COVID-19 declaration under Section 564(b)(1) of the Act, 21 U.S.C. section  360bbb-3(b)(1), unless the authorization is terminated or revoked sooner. Performed at [facility], [place] [address] [place], [place] [address]      Radiological Exams on Admission: Dg Shoulder Right  Result Date: 11/12/2018 CLINICAL DATA:  Pain after fall earlier today EXAM: RIGHT SHOULDER - 2+ VIEW COMPARISON:  Chest x-ray February 04, 2017 FINDINGS: There is a mid to distal clavicular fracture not seen on the comparison x-ray. Limited views of the right chest are normal. Calcification superior to the humeral head is likely due to calcific tendinosis of the rotator cuff. Mild irregularity at the rotator cuff insertion site is likely degenerative. The patient is status post vertebroplasty of a midthoracic vertebral body. No other abnormalities. IMPRESSION: 1. Fracture in the mid to distal clavicle. This was not seen in October of 2019 and is worrisome for an acute fracture given history. No other abnormalities. Electronically Signed   By: Dorise Bullion III M.D   On: 11/12/2018 17:59   Dg Ankle 2 Views Right  Result Date: 11/12/2018 CLINICAL DATA:  Pain after fall EXAM: RIGHT ANKLE - 2 VIEW COMPARISON:  None. FINDINGS: There is no evidence of fracture, dislocation, or joint effusion. There is no evidence of arthropathy or other focal bone abnormality. Soft tissues are unremarkable. IMPRESSION: Negative. Electronically Signed   By: Dorise Bullion III M.D   On: 11/12/2018 17:59   Ct Head Wo Contrast  Result Date: 11/12/2018 CLINICAL DATA:  Recent syncopal episode with head injury, initial encounter EXAM: CT HEAD WITHOUT CONTRAST TECHNIQUE: Contiguous axial images were obtained from the base of the skull through the vertex without intravenous contrast. COMPARISON:  02/01/2018 FINDINGS: Brain: Mild atrophic and chronic white matter ischemic changes are noted. No acute hemorrhage, acute infarction or space-occupying mass lesion are seen. Vascular: No hyperdense vessel or unexpected  calcification. Skull: Normal. Negative for fracture or focal lesion. Sinuses/Orbits: Chronic opacification of the right maxillary antrum is seen. Other: None IMPRESSION: Chronic atrophic and ischemic changes without acute abnormality. Electronically Signed   By: Inez Catalina M.D.   On: 11/12/2018 18:08     EKG: Independently reviewed.  Sinus rhythm, QTC 472, LAD, poor R wave progression, left bundle  blockage which existed in previous EKG.    Assessment/Plan Principal Problem:   Syncope Active Problems:   Hypothyroidism   Hyperlipidemia   Coronary artery disease   Chronic systolic CHF (congestive heart failure) ([place])   Fall   Right clavicle fracture   Syncope: Etiology is not clear. The differential diagnosis is broad, including vasovagal syncope, TIA/stroke, arrhythmia, ACS (less likely, given no chest pain and negative trop), orthostatic status.  - Place on tele bed for obs - Orthostatic vital signs  - MRI-brain - 2d echo - Neuro checks  - IVF: NS 75 cc/h - PT/OT eval and treat  Hypothyroidism: -Continue Synthroid  Hyperlipidemia: -Crestor  Coronary artery disease: s/p of stent -Continue Plavix, Crestor, Coreg -As needed nitroglycerin  Chronic systolic CHF (congestive heart failure) ([place]): 2D echo on 12/31/2017 showed EF of 30-50% with grade 1 diastolic dysfunction.  Patient does not have leg edema.  No shortness of breath or JVD.  CHF seem to be compensated.  Patient is not taking diuretics at home. - Check a BMP since patient is on IV fluid   Fall and Right clavicle fracture: CT head is negative for acute intracranial abnormalities.  No neck pain.  X-ray showed right clavicle fracture, which likely not need surgery.  No neurovascular compromise.  Patient also has right ankle pain, but no fracture on x-ray. -PT/OT - Sling immobilizer to shoulder -Brace to right ankle -PRN Percocet, morphine, Robaxin -may need to discuss with Ortho in AM or follow-up with ortho as  outpatient    DVT ppx: SQ Lovenox Code Status: Full code Family Communication: None at bed side.    Disposition Plan:  Anticipate discharge back to previous home environment Consults called:  none Admission status: Obs / tele       Date of Service 11/12/2018    [facility]   If 7PM-7AM, please contact night-coverage [email or web address] Password [facility] 11/12/2018, 10:02 PM

## 2018-11-13 ENCOUNTER — Observation Stay (HOSPITAL_COMMUNITY): Payer: Medicare Other

## 2018-11-13 DIAGNOSIS — Z7902 Long term (current) use of antithrombotics/antiplatelets: Secondary | ICD-10-CM | POA: Diagnosis not present

## 2018-11-13 DIAGNOSIS — M810 Age-related osteoporosis without current pathological fracture: Secondary | ICD-10-CM | POA: Diagnosis present

## 2018-11-13 DIAGNOSIS — S42031A Displaced fracture of lateral end of right clavicle, initial encounter for closed fracture: Secondary | ICD-10-CM | POA: Diagnosis present

## 2018-11-13 DIAGNOSIS — I2510 Atherosclerotic heart disease of native coronary artery without angina pectoris: Secondary | ICD-10-CM | POA: Diagnosis present

## 2018-11-13 DIAGNOSIS — I5022 Chronic systolic (congestive) heart failure: Secondary | ICD-10-CM

## 2018-11-13 DIAGNOSIS — Z820 Family history of epilepsy and other diseases of the nervous system: Secondary | ICD-10-CM | POA: Diagnosis not present

## 2018-11-13 DIAGNOSIS — W19XXXD Unspecified fall, subsequent encounter: Secondary | ICD-10-CM | POA: Diagnosis not present

## 2018-11-13 DIAGNOSIS — R402362 Coma scale, best motor response, obeys commands, at arrival to emergency department: Secondary | ICD-10-CM | POA: Diagnosis present

## 2018-11-13 DIAGNOSIS — E039 Hypothyroidism, unspecified: Secondary | ICD-10-CM | POA: Diagnosis present

## 2018-11-13 DIAGNOSIS — I447 Left bundle-branch block, unspecified: Secondary | ICD-10-CM | POA: Diagnosis present

## 2018-11-13 DIAGNOSIS — I110 Hypertensive heart disease with heart failure: Secondary | ICD-10-CM | POA: Diagnosis present

## 2018-11-13 DIAGNOSIS — Z8781 Personal history of (healed) traumatic fracture: Secondary | ICD-10-CM | POA: Diagnosis not present

## 2018-11-13 DIAGNOSIS — Z79890 Hormone replacement therapy (postmenopausal): Secondary | ICD-10-CM | POA: Diagnosis not present

## 2018-11-13 DIAGNOSIS — Z20828 Contact with and (suspected) exposure to other viral communicable diseases: Secondary | ICD-10-CM | POA: Diagnosis present

## 2018-11-13 DIAGNOSIS — I252 Old myocardial infarction: Secondary | ICD-10-CM | POA: Diagnosis not present

## 2018-11-13 DIAGNOSIS — R402142 Coma scale, eyes open, spontaneous, at arrival to emergency department: Secondary | ICD-10-CM | POA: Diagnosis present

## 2018-11-13 DIAGNOSIS — W1839XA Other fall on same level, initial encounter: Secondary | ICD-10-CM | POA: Diagnosis present

## 2018-11-13 DIAGNOSIS — R55 Syncope and collapse: Secondary | ICD-10-CM

## 2018-11-13 DIAGNOSIS — I429 Cardiomyopathy, unspecified: Secondary | ICD-10-CM | POA: Diagnosis present

## 2018-11-13 DIAGNOSIS — S93401A Sprain of unspecified ligament of right ankle, initial encounter: Secondary | ICD-10-CM | POA: Diagnosis present

## 2018-11-13 DIAGNOSIS — E785 Hyperlipidemia, unspecified: Secondary | ICD-10-CM | POA: Diagnosis present

## 2018-11-13 DIAGNOSIS — Z8249 Family history of ischemic heart disease and other diseases of the circulatory system: Secondary | ICD-10-CM | POA: Diagnosis not present

## 2018-11-13 DIAGNOSIS — H353130 Nonexudative age-related macular degeneration, bilateral, stage unspecified: Secondary | ICD-10-CM | POA: Diagnosis present

## 2018-11-13 DIAGNOSIS — Y92512 Supermarket, store or market as the place of occurrence of the external cause: Secondary | ICD-10-CM | POA: Diagnosis not present

## 2018-11-13 DIAGNOSIS — I951 Orthostatic hypotension: Secondary | ICD-10-CM | POA: Diagnosis present

## 2018-11-13 DIAGNOSIS — I493 Ventricular premature depolarization: Secondary | ICD-10-CM | POA: Diagnosis present

## 2018-11-13 DIAGNOSIS — R402252 Coma scale, best verbal response, oriented, at arrival to emergency department: Secondary | ICD-10-CM | POA: Diagnosis present

## 2018-11-13 DIAGNOSIS — S42001A Fracture of unspecified part of right clavicle, initial encounter for closed fracture: Secondary | ICD-10-CM

## 2018-11-13 DIAGNOSIS — Z955 Presence of coronary angioplasty implant and graft: Secondary | ICD-10-CM | POA: Diagnosis not present

## 2018-11-13 LAB — CBC
HCT: 38.1 % (ref 36.0–46.0)
Hemoglobin: 12 g/dL (ref 12.0–15.0)
MCH: 31.9 pg (ref 26.0–34.0)
MCHC: 31.5 g/dL (ref 30.0–36.0)
MCV: 101.3 fL — ABNORMAL HIGH (ref 80.0–100.0)
Platelets: 193 10*3/uL (ref 150–400)
RBC: 3.76 MIL/uL — ABNORMAL LOW (ref 3.87–5.11)
RDW: 13.8 % (ref 11.5–15.5)
WBC: 7.2 10*3/uL (ref 4.0–10.5)
nRBC: 0 % (ref 0.0–0.2)

## 2018-11-13 LAB — HEPATIC FUNCTION PANEL
ALT: 20 U/L (ref 0–44)
AST: 21 U/L (ref 15–41)
Albumin: 3.9 g/dL (ref 3.5–5.0)
Alkaline Phosphatase: 53 U/L (ref 38–126)
Bilirubin, Direct: 0.1 mg/dL (ref 0.0–0.2)
Indirect Bilirubin: 0.3 mg/dL (ref 0.3–0.9)
Total Bilirubin: 0.4 mg/dL (ref 0.3–1.2)
Total Protein: 7 g/dL (ref 6.5–8.1)

## 2018-11-13 LAB — TROPONIN I (HIGH SENSITIVITY)
Troponin I (High Sensitivity): 13 ng/L (ref <18)
Troponin I (High Sensitivity): 12 ng/L (ref <18)

## 2018-11-13 LAB — LIPID PANEL
Cholesterol: 136 mg/dL (ref 0–200)
HDL: 49 mg/dL (ref >40)
LDL Cholesterol: 57 mg/dL (ref 0–99)
Total CHOL/HDL Ratio: 2.8 RATIO
Triglycerides: 148 mg/dL (ref <150)
VLDL: 30 mg/dL (ref 0–40)

## 2018-11-13 LAB — ECHOCARDIOGRAM COMPLETE
Height: 63 in
Weight: 2127 oz

## 2018-11-13 LAB — BASIC METABOLIC PANEL
Anion gap: 8 (ref 5–15)
BUN: 12 mg/dL (ref 8–23)
CO2: 20 mmol/L — ABNORMAL LOW (ref 22–32)
Calcium: 8.3 mg/dL — ABNORMAL LOW (ref 8.9–10.3)
Chloride: 112 mmol/L — ABNORMAL HIGH (ref 98–111)
Creatinine, Ser: 0.68 mg/dL (ref 0.44–1.00)
GFR calc Af Amer: >60 mL/min (ref >60)
GFR calc non Af Amer: >60 mL/min (ref >60)
Glucose, Bld: 95 mg/dL (ref 70–99)
Potassium: 3.7 mmol/L (ref 3.5–5.1)
Sodium: 140 mmol/L (ref 135–145)

## 2018-11-13 MED ORDER — MORPHINE SULFATE (PF) 2 MG/ML IV SOLN: 0.5000 mg | INTRAVENOUS | Status: DC | PRN

## 2018-11-13 MED ORDER — ACETAMINOPHEN 650 MG RE SUPP: 650.0000 mg | Freq: Four times a day (QID) | RECTAL | Status: DC

## 2018-11-13 MED ORDER — ACETAMINOPHEN 325 MG PO TABS
650.0000 mg | ORAL_TABLET | Freq: Four times a day (QID) | ORAL | Status: DC
  Administered 2018-11-13 – 2018-11-14 (×5): 650 mg via ORAL
  Filled 2018-11-13 (×5): qty 2

## 2018-11-13 MED ORDER — OXYCODONE HCL 5 MG PO TABS
5.0000 mg | ORAL_TABLET | Freq: Four times a day (QID) | ORAL | Status: DC | PRN
  Administered 2018-11-13: 5 mg via ORAL
  Filled 2018-11-13: qty 1

## 2018-11-13 NOTE — Plan of Care (Signed)
  Problem: Clinical Measurements: Goal: Diagnostic test results will improve Outcome: Progressing   Problem: Coping: Goal: Level of anxiety will decrease Outcome: Progressing   Problem: Elimination: Goal: Will not experience complications related to bowel motility Outcome: Progressing Goal: Will not experience complications related to urinary retention Outcome: Progressing   Problem: Pain Managment: Goal: General experience of comfort will improve Outcome: Progressing   Problem: Safety: Goal: Ability to remain free from injury will improve Outcome: Progressing   Problem: Skin Integrity: Goal: Risk for impaired skin integrity will decrease Outcome: Progressing

## 2018-11-13 NOTE — Evaluation (Addendum)
Physical Therapy Evaluation Patient Details Name: Susan [name] MRN: [id number] DOB: 1934/01/10 Today's Date: 11/13/2018   History of Present Illness  Susan [name] is a 85 y.o. female with medical history significant of sCHF with EF of 30%, hyperlipidemia, hypothyroidism, CAD, stent placement, hypothyroidism, anxiety, pituitary adenoma, who presents with syncope. Patient reports she passed out at store and fell. Right shoulder/scapula and right ankle pain. No right ankle fracture, right mid to distal clavicle fracture.    Clinical Impression  Patient is s/p right ankle sprain and right clavicular fracture after syncopal episode resulting in functional limitations due to the deficits listed below (see PT Problem List). Patient independent with all mobility prior to admission. Rt arm sling, Rt ankle brace with WBAT. Bed mobility slow and with HOB elevated and use of bedrail. 25% WB over RLE for transfers and side-stepping at EOB with increased pain with WB and movement. Overall, patient tolerated eval well. Patient requests to not to go to SNF but to return home with [facility] services and family/church community assistance. Patient will benefit from skilled PT to increase their independence and safety with mobility to allow discharge to the venue listed below.       Follow Up Recommendations SNF;Home health PT;Supervision for mobility/OOB(patient requesting HH with family/church community support.)    Equipment Recommendations  Other (comment);Wheelchair (measurements PT);3in1 (PT)(hemi-walker, tub bench)    Recommendations for Other Services       Precautions / Restrictions Precautions Precautions: Fall Restrictions Weight Bearing Restrictions: No      Mobility  Bed Mobility Overal bed mobility: Modified Independent             General bed mobility comments: HOB raised, bedrail use  Transfers Overall transfer level: Needs assistance Equipment used: Rolling walker (2  wheeled) Transfers: Sit to/from Stand;Lateral/Scoot Transfers Sit to Stand: Min assist        Lateral/Scoot Transfers: Min assist General transfer comment: LUE use on RW, decreased WB through rt ankle approximately 25% WB  Ambulation/Gait Ambulation/Gait assistance: Min assist Gait Distance (Feet): 4 Feet Assistive device: Rolling walker (2 wheeled) Gait Pattern/deviations: Step-to pattern;Decreased step length - right;Decreased step length - left;Decreased stance time - right;Decreased stride length;Decreased dorsiflexion - right;Decreased weight shift to right;Antalgic Gait velocity: decreased   General Gait Details: limited to side steps, 25% WB on RLE, heavy use of LUE, RUE in sling  Stairs            Wheelchair Mobility    Modified Rankin (Stroke Patients Only)       Balance Overall balance assessment: Needs assistance Sitting-balance support: Single extremity supported;Feet supported Sitting balance-Leahy Scale: Fair     Standing balance support: Single extremity supported;During functional activity Standing balance-Leahy Scale: Poor Standing balance comment: LUE support, 25% WB through RLE                             Pertinent Vitals/Pain Pain Assessment: 0-10 Pain Score: 0-No pain Pain Location: with movement - right clavicle 6/10; right ankle - with pressure 6/10 Pain Descriptors / Indicators: Sharp Pain Intervention(s): Limited activity within patient's tolerance;Monitored during session    Home Living Family/patient expects to be discharged to:: Private residence Living Arrangements: Alone Available Help at Discharge: Family;Friend(s)(daughter, works full time; church friend community.) Type of Home: House Home Access: Stairs to enter Entrance Stairs-Rails: Chemical engineer of Steps: 4 Home Layout: One level Home Equipment: Cane - single point;Adaptive equipment  Prior Function Level of Independence: Independent                Hand Dominance   Dominant Hand: Right    Extremity/Trunk Assessment   Upper Extremity Assessment Upper Extremity Assessment: RUE deficits/detail RUE Deficits / Details: right arm in sling RUE: Unable to fully assess due to immobilization    Lower Extremity Assessment Lower Extremity Assessment: RLE deficits/detail RLE Deficits / Details: ankle brace RLE: Unable to fully assess due to immobilization    Cervical / Trunk Assessment Cervical / Trunk Assessment: Normal  Communication   Communication: No difficulties  Cognition Arousal/Alertness: Awake/alert Behavior During Therapy: WFL for tasks assessed/performed Overall Cognitive Status: Within Functional Limits for tasks assessed                                        General Comments      Exercises     Assessment/Plan    PT Assessment Patient needs continued PT services  PT Problem List Decreased strength;Pain;Decreased range of motion;Decreased activity tolerance;Decreased knowledge of use of DME;Decreased balance;Decreased mobility       PT Treatment Interventions DME instruction;Therapeutic exercise;Gait training;Balance training;Stair training;Therapeutic activities;Patient/family education    PT Goals (Current goals can be found in the Care Plan section)  Acute Rehab PT Goals Patient Stated Goal: Go home with [facility] services and family/church community support. PT Goal Formulation: With patient Time For Goal Achievement: 11/27/18 Potential to Achieve Goals: Fair    Frequency Min 4X/week   Barriers to discharge        Co-evaluation               AM-PAC PT "6 Clicks" Mobility  Outcome Measure Help needed turning from your back to your side while in a flat bed without using bedrails?: A Lot Help needed moving from lying on your back to sitting on the side of a flat bed without using bedrails?: A Lot Help needed moving to and from a bed to a chair (including a  wheelchair)?: A Lot Help needed standing up from a chair using your arms (e.g., wheelchair or bedside chair)?: A Lot Help needed to walk in hospital room?: A Lot Help needed climbing 3-5 steps with a railing? : A Lot 6 Click Score: 12    End of Session Equipment Utilized During Treatment: Gait belt(R shoulder sling; Rt ankle brace) Activity Tolerance: Patient limited by pain Patient left: in bed;with call bell/phone within reach;with bed alarm set Nurse Communication: Mobility status PT Visit Diagnosis: Unsteadiness on feet (R26.81);Other abnormalities of gait and mobility (R26.89);Repeated falls (R29.6)    Time: 1310-1340 PT Time Calculation (min) (ACUTE ONLY): 30 min   Charges:   PT Evaluation $PT Eval Moderate Complexity: 1 Mod          Chanell Nadeau D. Hartnett-Rands, MS, PT Per [place] #[id number] 11/13/2018, 1:54 PM

## 2018-11-13 NOTE — Progress Notes (Signed)
PROGRESS NOTE  Susan [name]  MEQ:[id number]  DOB: 16-Feb-1934  DOA: 11/12/2018 PCP: Crist Infante, MD  Brief Admission Hx: 85 year old female with systolic CHF, EF 29%, hyperlipidemia, hypothyroidism, CAD with stent placement, hypothyroidism and pituitary adenoma presented with syncopal episode.  MDM/Assessment & Plan:   1. Orthostatic syncope- patient is responding well to gentle hydration, cardiology is seen an echocardiogram and serial troponins are being monitored.  Continue telemetry monitoring.  MRI brain pending.  PT/OT evaluation pending. 2. Hypothyroidism-continue home levothyroxine. 3. Hyperlipidemia-continue Crestor daily. 4. Coronary artery disease status post stent placement-continue home cardiac medications.  Troponins are being serially tested. 5. Chronic systolic CHF-patient seems compensated at this time.  I have reduced her IV fluids.  She is currently not taking diuretics.  Follow electrolytes. 6. Right clavicle fracture- patient reports that she is tolerating the sling at this time.  PT OT evaluation pending.  Continue pain management as ordered.  Outpatient orthopedic follow-up recommended.  DVT prophylaxis: Lovenox  code Status: Full Family Communication: Patient updated at bedside Disposition Plan: Inpatient for syncope work-up, subspecialty consultation and testing.  Consultants:  Cardiology  Procedures:  2D echocardiogram IMPRESSIONS   1. The left ventricle has mild-moderately reduced systolic function, with an ejection fraction of 40-45%. The cavity size was normal. Left ventricular diastolic Doppler parameters are consistent with impaired relaxation.  2. There is septal dyssynergy due to the LBBB.  3. The right ventricle has normal systolic function. The cavity was normal. There is no increase in right ventricular wall thickness.  4. Left atrial size was moderately dilated.  5. No evidence of mitral valve stenosis.  6. The aortic valve is tricuspid.  Moderate thickening of the aortic valve. Moderate calcification of the aortic valve. Aortic valve regurgitation is mild by color flow Doppler. No stenosis of the aortic valve.  7. The aortic root and ascending aorta are normal in size and structure.  8. The interatrial septum was not assessed.  Antimicrobials:  N/a   Subjective: Patient is awake and alert and reports no chest pain or shortness of breath.  Patient reports left shoulder pain and discomfort.  Patient also has right ankle pain.  Objective: Vitals:   11/12/18 2214 11/12/18 2300 11/13/18 0555 11/13/18 1015  BP: (!) 149/77  (!) 103/56 118/70  Pulse: 72  77 73  Resp: 18  17 16   Temp: 98.2 F (36.8 C)  99.2 F (37.3 C) 98.6 F (37 C)  TempSrc: Oral   Oral  SpO2: 98%  93% 95%  Weight: 60.3 kg 60.3 kg 60.3 kg   Height: 5\' 3"  (1.6 m) 5\' 3"  (1.6 m)      Intake/Output Summary (Last 24 hours) at 11/13/2018 1214 Last data filed at 11/13/2018 0600 Gross per 24 hour  Intake 1468.12 ml  Output --  Net 1468.12 ml   Filed Weights   11/12/18 2214 11/12/18 2300 11/13/18 0555  Weight: 60.3 kg 60.3 kg 60.3 kg   REVIEW OF SYSTEMS  As per history otherwise all reviewed and reported negative  Exam:  General exam: Awake, alert, no apparent distress, cooperative. Respiratory system: Clear. No increased work of breathing. Cardiovascular system: S1 & S2 heard. No JVD, murmurs, gallops, clicks or pedal edema. Gastrointestinal system: Abdomen is nondistended, soft and nontender. Normal bowel sounds heard. Central nervous system: Alert and oriented. No focal neurological deficits. Extremities: Right shoulder in the sling, right ankle in brace.  Data Reviewed: Basic Metabolic Panel: Recent Labs  Lab 11/12/18 1713 11/13/18 0532  NA  139 140  K 3.6 3.7  CL 105 112*  CO2 22 20*  GLUCOSE 160* 95  BUN 17 12  CREATININE 0.94 0.68  CALCIUM 9.6 8.3*   Liver Function Tests: Recent Labs  Lab 11/13/18 0857  AST 21  ALT 20   ALKPHOS 53  BILITOT 0.4  PROT 7.0  ALBUMIN 3.9   No results for input(s): LIPASE, AMYLASE in the last 168 hours. No results for input(s): AMMONIA in the last 168 hours. CBC: Recent Labs  Lab 11/12/18 1713 11/13/18 0532  WBC 7.0 7.2  HGB 13.4 12.0  HCT 41.1 38.1  MCV 100.7* 101.3*  PLT 223 193   Cardiac Enzymes: No results for input(s): CKTOTAL, CKMB, CKMBINDEX, TROPONINI in the last 168 hours. CBG (last 3)  Recent Labs    11/12/18 1824  GLUCAP 98   Recent Results (from the past 240 hour(s))  SARS Coronavirus 2 (CEPHEID - Performed in [place] hospital lab), Hosp Order     Status: None   Collection Time: 11/12/18  6:33 PM   Specimen: Nasopharyngeal Swab  Result Value Ref Range Status   SARS Coronavirus 2 NEGATIVE NEGATIVE Final    Comment: (NOTE) If result is NEGATIVE SARS-CoV-2 target nucleic acids are NOT DETECTED. The SARS-CoV-2 RNA is generally detectable in upper and lower  respiratory specimens during the acute phase of infection. The lowest  concentration of SARS-CoV-2 viral copies this assay can detect is 250  copies / mL. A negative result does not preclude SARS-CoV-2 infection  and should not be used as the sole basis for treatment or other  patient management decisions.  A negative result may occur with  improper specimen collection / handling, submission of specimen other  than nasopharyngeal swab, presence of viral mutation(s) within the  areas targeted by this assay, and inadequate number of viral copies  (<250 copies / mL). A negative result must be combined with clinical  observations, patient history, and epidemiological information. If result is POSITIVE SARS-CoV-2 target nucleic acids are DETECTED. The SARS-CoV-2 RNA is generally detectable in upper and lower  respiratory specimens dur ing the acute phase of infection.  Positive  results are indicative of active infection with SARS-CoV-2.  Clinical  correlation with patient history and other  diagnostic information is  necessary to determine patient infection status.  Positive results do  not rule out bacterial infection or co-infection with other viruses. If result is PRESUMPTIVE POSTIVE SARS-CoV-2 nucleic acids MAY BE PRESENT.   A presumptive positive result was obtained on the submitted specimen  and confirmed on repeat testing.  While 2019 novel coronavirus  (SARS-CoV-2) nucleic acids may be present in the submitted sample  additional confirmatory testing may be necessary for epidemiological  and / or clinical management purposes  to differentiate between  SARS-CoV-2 and other Sarbecovirus currently known to infect humans.  If clinically indicated additional testing with an alternate test  methodology [phone]) is advised. The SARS-CoV-2 RNA is generally  detectable in upper and lower respiratory sp ecimens during the acute  phase of infection. The expected result is Negative. Fact Sheet for Patients:  StrictlyIdeas.no Fact Sheet for Healthcare Providers: BankingDealers.co.za This test is not yet approved or cleared by the [place] FDA and has been authorized for detection and/or diagnosis of SARS-CoV-2 by FDA under an Emergency Use Authorization (EUA).  This EUA will remain in effect (meaning this test can be used) for the duration of the COVID-19 declaration under Section 564(b)(1) of the Act, 21 U.S.C.  section 360bbb-3(b)(1), unless the authorization is terminated or revoked sooner. Performed at [facility], [place] [address], [place], [place] [address]     Studies: Dg Shoulder Right  Result Date: 11/12/2018 CLINICAL DATA:  Pain after fall earlier today EXAM: RIGHT SHOULDER - 2+ VIEW COMPARISON:  Chest x-ray February 04, 2017 FINDINGS: There is a mid to distal clavicular fracture not seen on the comparison x-ray. Limited views of the right chest are normal. Calcification superior to the humeral head  is likely due to calcific tendinosis of the rotator cuff. Mild irregularity at the rotator cuff insertion site is likely degenerative. The patient is status post vertebroplasty of a midthoracic vertebral body. No other abnormalities. IMPRESSION: 1. Fracture in the mid to distal clavicle. This was not seen in October of 2019 and is worrisome for an acute fracture given history. No other abnormalities. Electronically Signed   By: Dorise Bullion III M.D   On: 11/12/2018 17:59   Dg Ankle 2 Views Right  Result Date: 11/12/2018 CLINICAL DATA:  Pain after fall EXAM: RIGHT ANKLE - 2 VIEW COMPARISON:  None. FINDINGS: There is no evidence of fracture, dislocation, or joint effusion. There is no evidence of arthropathy or other focal bone abnormality. Soft tissues are unremarkable. IMPRESSION: Negative. Electronically Signed   By: Dorise Bullion III M.D   On: 11/12/2018 17:59   Ct Head Wo Contrast  Result Date: 11/12/2018 CLINICAL DATA:  Recent syncopal episode with head injury, initial encounter EXAM: CT HEAD WITHOUT CONTRAST TECHNIQUE: Contiguous axial images were obtained from the base of the skull through the vertex without intravenous contrast. COMPARISON:  02/01/2018 FINDINGS: Brain: Mild atrophic and chronic white matter ischemic changes are noted. No acute hemorrhage, acute infarction or space-occupying mass lesion are seen. Vascular: No hyperdense vessel or unexpected calcification. Skull: Normal. Negative for fracture or focal lesion. Sinuses/Orbits: Chronic opacification of the right maxillary antrum is seen. Other: None IMPRESSION: Chronic atrophic and ischemic changes without acute abnormality. Electronically Signed   By: Inez Catalina M.D.   On: 11/12/2018 18:08   Mr Brain Wo Contrast  Result Date: 11/13/2018 CLINICAL DATA:  Syncope. Cardiac etiology suspected. Personal history of myocardial infarction cardiac stents. EXAM: MRI HEAD WITHOUT CONTRAST TECHNIQUE: Multiplanar, multiecho pulse sequences  of the brain and surrounding structures were obtained without intravenous contrast. COMPARISON:  CT head without contrast 11/12/2018. CT head without contrast 02/01/2018. MRI brain 01/04/2018. FINDINGS: Brain: The diffusion-weighted images demonstrate no acute or subacute infarction. There is no hemorrhage or mass lesion. Mild atrophy and white matter changes are similar the prior study, within normal limits for age. The ventricles are proportionate to the degree of atrophy. No significant extraaxial fluid collection is present. Dilated perivascular spaces are present within the basal ganglia. Brainstem and cerebellum are normal. Vascular: Flow is present in the major intracranial arteries. Skull and upper cervical spine: Degenerative changes the cervical spine are again noted with slight retrolisthesis at C3-4. Sinuses/Orbits: Chronic right maxillary sinus opacification is present. Mild mucosal thickening is again noted in the anterior ethmoid air cells. Right mastoid effusion is new since yesterday. No obstructing nasopharyngeal lesion is present. Bilateral lens replacements are noted. Globes and orbits are otherwise unremarkable. IMPRESSION: 1. Normal MRI appearance of the brain for age. 2. No acute or focal abnormality to explain syncopal episodes. 3. Chronic right maxillary sinus opacification. 4. New right mastoid effusion.  No obstructive lesions are present. Electronically Signed   By: San Morelle M.D.   On: 11/13/2018  11:30   Scheduled Meds:  acetaminophen  650 mg Oral Q6H   Or   acetaminophen  650 mg Rectal Q6H   calcium-vitamin D  1 tablet Oral QHS   carvedilol  3.125 mg Oral BID WC   cholecalciferol  1,000 Units Oral QPC breakfast   clopidogrel  75 mg Oral Daily   enoxaparin (LOVENOX) injection  40 mg Subcutaneous QHS   levothyroxine  75 mcg Oral QAC breakfast   multivitamin  1 tablet Oral BID   omega-3 acid ethyl esters  1 g Oral Daily   rosuvastatin  40 mg Oral QHS    sodium chloride flush  3 mL Intravenous Once   sodium chloride flush  3 mL Intravenous Q12H   venlafaxine XR  150 mg Oral Q breakfast   vitamin B-12  2,000 mcg Oral Daily   Continuous Infusions:  sodium chloride 35 mL/hr at 11/13/18 0715    Principal Problem:   Syncope Active Problems:   Hypothyroidism   Hyperlipidemia   Coronary artery disease   Chronic systolic CHF (congestive heart failure) ([place])   Fall   Right clavicle fracture   Syncope and collapse  Time spent:   Irwin Brakeman, MD [facility] 11/13/2018, 12:14 PM    LOS: 0 days  How to contact the [facility] Attending or Consulting provider [place] or covering provider during after hours [place], for this patient?  1. Check the care team in [facility] and look for a) attending/consulting [facility] provider listed and b) the [facility] team listed 2. Log into [email or web address] and use [place]'s universal password to access. If you do not have the password, please contact the hospital operator. 3. Locate the [facility] provider you are looking for under [facility] and page to a number that you can be directly reached. 4. If you still have difficulty reaching the provider, please page the [facility] (Director on Call) for the Hospitalists listed on amion for assistance.

## 2018-11-13 NOTE — Consult Note (Signed)
Cardiology Consultation:   Patient ID: Susan [name] MRN: [id number]; DOB: 1933/05/29  Admit date: 11/12/2018 Date of Consult: 11/13/2018  Primary Care Provider: Crist Infante, MD Primary Cardiologist: Mertie Moores, MD  Primary Electrophysiologist:  None   Patient Profile:   Susan [name] is a 85 y.o. female with a hx of CAD,  chronic systolic CHF ,  hyperlipidemia   who is being seen today for the evaluation of  Syncope  at the request of Dr. Wynetta Emery .  History of Present Illness:   Susan [name]  Is a 85 yo with hx of CAD, HTN, hyperlideima. I have known her for years - s/p steting around 2000.  She has remained active,  Walks 45 -60 min a day around her neighborhood.   No angina  Yesterday , worked out in her garden.   Ate only watermelon for lunch and then went to the store.   In the store she developed diaphoresis and felt light headed.  Went to the restroom in the store.   Sat on the toilet to rest.   When she got up from the toilet, she became very lightheaded and passed out in the bathroom.  Was found by store employ and was helped out to her car.   C/p pain in right shoulder and ankle.   Drove herself to [facility] and came in to ER.  Troponin was negative.   CT of head - no acute issue.  BP was stable in er     Heart Pathway Score:     Past Medical History:  Diagnosis Date   Coronary artery disease    POST PTCA AND STENTING OF HER RIGHT CORONARY ARTERY   Hypothyroidism    LAD stenosis    MODERATE 50-60% STENOSIS   Osteoporosis     Past Surgical History:  Procedure Laterality Date   APPENDECTOMY  1970s?   "ruptured"    BACK SURGERY     CATARACT EXTRACTION W/ INTRAOCULAR LENS  IMPLANT, BILATERAL Bilateral    CORONARY ANGIOPLASTY WITH STENT PLACEMENT  ~ 2009   "2 stents"   FRACTURE SURGERY     IR VERTEBROPLASTY CERV/THOR BX INC UNI/BIL INC/INJECT/IMAGING  02/07/2018   KNEE ARTHROSCOPY Right     torn meniscus   LUMBAR LAMINECTOMY  1978   OVARIAN CYST  SURGERY  1970s?   "ruptured"    RIGHT/LEFT HEART CATH AND CORONARY ANGIOGRAPHY N/A 10/08/2017   Procedure: RIGHT/LEFT HEART CATH AND CORONARY ANGIOGRAPHY;  Surgeon: Nelva Bush, MD;  Location: [facility];  Service: Cardiovascular;  Laterality: N/A;   WRIST FRACTURE SURGERY Bilateral    "2 surgeries for 2 breaks on left side; 1 surgery for 1 break on right wrist"     Home Medications:  Prior to Admission medications   Medication Sig Start Date End Date Taking? Authorizing Provider  acetaminophen (TYLENOL) 325 MG tablet Take 2 tablets (650 mg total) by mouth every 6 (six) hours as needed for mild pain (or Fever >/= 101). 02/11/18  Yes Annita Brod, MD  Calcium Carbonate-Vitamin D (CALCIUM + D PO) Take 1 tablet by mouth at bedtime.    Yes [provider]  carvedilol (COREG) 3.125 MG tablet Take 1 tablet by mouth twice daily Patient taking differently: Take 3.125 mg by mouth 2 (two) times daily with a meal.  09/20/18  Yes Corran Lalone, Wonda Cheng, MD  cholecalciferol (VITAMIN D3) 25 MCG (1000 UT) tablet Take 1,000 Units by mouth daily after breakfast.   Yes [provider]  clopidogrel (PLAVIX) 75 MG tablet TAKE 1 TABLET BY MOUTH ONCE DAILY Patient taking differently: Take 75 mg by mouth daily.  10/04/17  Yes Yale Golla, Wonda Cheng, MD  levothyroxine (SYNTHROID, LEVOTHROID) 75 MCG tablet Take 75 mcg by mouth daily before breakfast.  11/30/17  Yes [provider]  multivitamin-lutein (OCUVITE-LUTEIN) CAPS capsule Take 1 capsule by mouth 2 (two) times a day.    Yes [provider]  nitroGLYCERIN (NITROSTAT) 0.4 MG SL tablet Place 1 tablet (0.4 mg total) under the tongue every 5 (five) minutes as needed. 06/22/11  Yes Wylee Dorantes, Wonda Cheng, MD  Omega-3 Fatty Acids (FISH OIL) 1200 MG CAPS Take 1,200 mg by mouth at bedtime.   Yes [provider]  rosuvastatin (CRESTOR) 40 MG tablet TAKE 1 TABLET BY MOUTH ONCE DAILY Patient taking differently: Take 40 mg by mouth  at bedtime.  01/05/18  Yes Mishael Haran, Wonda Cheng, MD  venlafaxine XR (EFFEXOR-XR) 150 MG 24 hr capsule TAKE 1 CAPSULE BY MOUTH ONCE DAILY Patient taking differently: Take 150 mg by mouth daily with breakfast.  06/01/18  Yes Manie Bealer, Wonda Cheng, MD  vitamin B-12 (CYANOCOBALAMIN) 1000 MCG tablet Take 2,000 mcg by mouth daily.    Yes [provider]  polyethylene glycol (MIRALAX / GLYCOLAX) packet Take 17 g by mouth daily. Patient not taking: Reported on 11/12/2018 02/12/18   Annita Brod, MD  senna (SENOKOT) 8.6 MG TABS tablet Take 1 tablet (8.6 mg total) by mouth 2 (two) times daily. Patient not taking: Reported on 11/12/2018 02/11/18   Annita Brod, MD    Inpatient Medications: Scheduled Meds:  calcium-vitamin D  1 tablet Oral QHS   carvedilol  3.125 mg Oral BID WC   cholecalciferol  1,000 Units Oral QPC breakfast   clopidogrel  75 mg Oral Daily   enoxaparin (LOVENOX) injection  40 mg Subcutaneous QHS   levothyroxine  75 mcg Oral QAC breakfast   multivitamin  1 tablet Oral BID   omega-3 acid ethyl esters  1 g Oral Daily   rosuvastatin  40 mg Oral QHS   sodium chloride flush  3 mL Intravenous Once   sodium chloride flush  3 mL Intravenous Q12H   venlafaxine XR  150 mg Oral Q breakfast   vitamin B-12  2,000 mcg Oral Daily   Continuous Infusions:  sodium chloride 75 mL/hr at 11/13/18 0204   PRN Meds: acetaminophen **OR** acetaminophen, methocarbamol, morphine injection, nitroGLYCERIN, ondansetron **OR** ondansetron (ZOFRAN) IV, oxyCODONE-acetaminophen  Allergies:   No Known Allergies  Social History:   Social History   Socioeconomic History   Marital status: Widowed    Spouse name: Not on file   Number of children: Not on file   Years of education: Not on file   Highest education level: Not on file  Occupational History   Not on file  Social Needs   Financial resource strain: Not on file   Food insecurity    Worry: Not on file    Inability:  Not on file   Transportation needs    Medical: Not on file    Non-medical: Not on file  Tobacco Use   Smoking status: Never Smoker   Smokeless tobacco: Never Used  Substance and Sexual Activity   Alcohol use: Yes    Alcohol/week: 1.0 standard drinks    Types: 1 Glasses of wine per week   Drug use: Never   Sexual activity: Not on file  Lifestyle   Physical activity  Days per week: Not on file    Minutes per session: Not on file   Stress: Not on file  Relationships   Social connections    Talks on phone: Not on file    Gets together: Not on file    Attends religious service: Not on file    Active member of club or organization: Not on file    Attends meetings of clubs or organizations: Not on file    Relationship status: Not on file   Intimate partner violence    Fear of current or ex partner: Not on file    Emotionally abused: Not on file    Physically abused: Not on file    Forced sexual activity: Not on file  Other Topics Concern   Not on file  Social History Narrative   Not on file    Family History:    Family History  Problem Relation Age of Onset   Alzheimer's disease Mother    Heart attack Father    Aneurysm Sister      ROS:  Please see the history of present illness.   All other ROS reviewed and negative.     Physical Exam/Data:   Vitals:   11/12/18 2130 11/12/18 2214 11/12/18 2300 11/13/18 0555  BP: (!) 142/71 (!) 149/77  (!) 103/56  Pulse: 76 72  77  Resp: 19 18  17   Temp:  98.2 F (36.8 C)  99.2 F (37.3 C)  TempSrc:  Oral    SpO2: 100% 98%  93%  Weight:  60.3 kg 60.3 kg 60.3 kg  Height:  5\' 3"  (1.6 m) 5\' 3"  (1.6 m)     Intake/Output Summary (Last 24 hours) at 11/13/2018 0845 Last data filed at 11/13/2018 0600 Gross per 24 hour  Intake 1468.12 ml  Output --  Net 1468.12 ml   Last 3 Weights 11/13/2018 11/12/2018 11/12/2018  Weight (lbs) 132 lb 15 oz 132 lb 15 oz 132 lb 15 oz  Weight (kg) 60.3 kg 60.3 kg 60.3 kg     Body  mass index is 23.55 kg/m.  General:  Well nourished, well developed, in no acute distress HEENT: normal Lymph: no adenopathy Neck: no JVD Endocrine:  No thryomegaly Vascular: No carotid bruits; FA pulses 2+ bilaterally without bruits  Cardiac:  normal S1, S2; RRR; no murmur  Lungs:  clear to auscultation bilaterally, no wheezing, rhonchi or rales  Abd: soft, nontender, no hepatomegaly  Ext: no edema Musculoskeletal:  No deformities, BUE and BLE strength normal and equal Skin: warm and dry  Neuro:  CNs 2-12 intact, no focal abnormalities noted Psych:  Normal affect   EKG:  The EKG was personally reviewed and demonstrates:    NSR at 84.  LBBB with associated TWI  Telemetry:  Telemetry was personally reviewed and demonstrates:   NSR, occasional PVCs   Relevant CV Studies:   Laboratory Data:  High Sensitivity Troponin:   Recent Labs  Lab 11/12/18 1729 11/12/18 2019  TROPONINIHS 5.0 5.0     Cardiac EnzymesNo results for input(s): TROPONINI in the last 168 hours. No results for input(s): TROPIPOC in the last 168 hours.  Chemistry Recent Labs  Lab 11/12/18 1713 11/13/18 0532  NA 139 140  K 3.6 3.7  CL 105 112*  CO2 22 20*  GLUCOSE 160* 95  BUN 17 12  CREATININE 0.94 0.68  CALCIUM 9.6 8.3*  GFRNONAA 56* >60  GFRAA >60 >60  ANIONGAP 12 8    No results for  input(s): PROT, ALBUMIN, AST, ALT, ALKPHOS, BILITOT in the last 168 hours. Hematology Recent Labs  Lab 11/12/18 1713 11/13/18 0532  WBC 7.0 7.2  RBC 4.08 3.76*  HGB 13.4 12.0  HCT 41.1 38.1  MCV 100.7* 101.3*  MCH 32.8 31.9  MCHC 32.6 31.5  RDW 13.8 13.8  PLT 223 193   BNP Recent Labs  Lab 11/12/18 2105  BNP 91.0    DDimer No results for input(s): DDIMER in the last 168 hours.   Radiology/Studies:  Dg Shoulder Right  Result Date: 11/12/2018 CLINICAL DATA:  Pain after fall earlier today EXAM: RIGHT SHOULDER - 2+ VIEW COMPARISON:  Chest x-ray February 04, 2017 FINDINGS: There is a mid to distal  clavicular fracture not seen on the comparison x-ray. Limited views of the right chest are normal. Calcification superior to the humeral head is likely due to calcific tendinosis of the rotator cuff. Mild irregularity at the rotator cuff insertion site is likely degenerative. The patient is status post vertebroplasty of a midthoracic vertebral body. No other abnormalities. IMPRESSION: 1. Fracture in the mid to distal clavicle. This was not seen in October of 2019 and is worrisome for an acute fracture given history. No other abnormalities. Electronically Signed   By: Dorise Bullion III M.D   On: 11/12/2018 17:59   Dg Ankle 2 Views Right  Result Date: 11/12/2018 CLINICAL DATA:  Pain after fall EXAM: RIGHT ANKLE - 2 VIEW COMPARISON:  None. FINDINGS: There is no evidence of fracture, dislocation, or joint effusion. There is no evidence of arthropathy or other focal bone abnormality. Soft tissues are unremarkable. IMPRESSION: Negative. Electronically Signed   By: Dorise Bullion III M.D   On: 11/12/2018 17:59   Ct Head Wo Contrast  Result Date: 11/12/2018 CLINICAL DATA:  Recent syncopal episode with head injury, initial encounter EXAM: CT HEAD WITHOUT CONTRAST TECHNIQUE: Contiguous axial images were obtained from the base of the skull through the vertex without intravenous contrast. COMPARISON:  02/01/2018 FINDINGS: Brain: Mild atrophic and chronic white matter ischemic changes are noted. No acute hemorrhage, acute infarction or space-occupying mass lesion are seen. Vascular: No hyperdense vessel or unexpected calcification. Skull: Normal. Negative for fracture or focal lesion. Sinuses/Orbits: Chronic opacification of the right maxillary antrum is seen. Other: None IMPRESSION: Chronic atrophic and ischemic changes without acute abnormality. Electronically Signed   By: Inez Catalina M.D.   On: 11/12/2018 18:08    Assessment and Plan:   1. Syncope:    Her symptoms are c/w orthostasis.   She had not had much  to eat that day.  She had a piece of watermelon for lunch.  This episode of syncope occurred around 2 or 3.  She knows that she has not been eating or drinking enough.  Her family members also have commented that she does not eat or drink and of water. I encouraged her to stay hydrated.  I have advised her to start drinking a V8 juice every day to help with sodium repletion especially during hot days when she has been out sweating and walking outside.  Of advised her to include more protein in her diet.  Initial troponin was negative.  Have ordered a repeat troponin this morning.  Her EKG remains unchanged with a left bundle branch block.  At this point I do not think that there is any indication to do an ischemic work-up.  Her monitor has shown normal sinus rhythm.  She is had a few episodes of PVCs and some  couplets but no runs of significant arrhythmias that would cause syncope.  Her CT scan was normal.  I see that she is scheduled for head MRI today.  I will plan on seeing her in the office in 4 to 6 weeks for follow-up visit.  She will call us sooner if she has any other problems.  2.  Chronic systolic congestive heart failure.  Her ejection fraction is 30 to 35%.  She has seen Dr. Lovena Le with electrophysiology and was thought not to be a good candidate for biventricular pacer because of low likelihood of benefit.  She is to continue with the carvedilol 3.125 mg twice a day.  She is not on an ACE inhibitor or ARB because of hypotension.  3.  Coronary artery disease: She is not had any episodes of angina.  She has been on Plavix 75 mg a day.      For questions or updates, please contact [place] Please consult [email or web address] for contact info under     Signed, Mertie Moores, MD  11/13/2018 8:45 AM

## 2018-11-13 NOTE — Evaluation (Signed)
Occupational Therapy Evaluation Patient Details Name: Susan [name] MRN: [id number] DOB: 06-16-1933 Today's Date: 11/13/2018    History of Present Illness Susan [name] is a 85 y.o. female with medical history significant of sCHF with EF of 30%, hyperlipidemia, hypothyroidism, CAD, stent placement, hypothyroidism, anxiety, pituitary adenoma, who presents with syncope. Patient reports she passed out at store and fell. Right shoulder/scapula and right ankle pain. No right ankle fracture, right mid to distal clavicle fracture.   Clinical Impression   Pt was independent and active prior to admission. Pt presents with R ankle and shoulder pain. She was not able to tolerate any weight on her R LE and pivoted to [facility] with min to min guard assist. She needs min to mod assist for ADL. Pt given shoulder handout and reviewed information and instructed in AROM R elbow to hand. Recommending SNF for further rehab, but pt refusing.Will need w/c if still not able to ambulate when discharged. Will follow acutely.    Follow Up Recommendations  SNF;Supervision/Assistance - 24 hour(HHOT and aide if pt continues to refuse SNF)    Equipment Recommendations  3 in 1 bedside commode;Wheelchair (measurements OT);Wheelchair cushion (measurements OT);Tub/shower seat    Recommendations for Other Services       Precautions / Restrictions Precautions Precautions: Fall Required Braces or Orthoses: Other Brace Other Brace: R ankle brace Restrictions Weight Bearing Restrictions: No      Mobility Bed Mobility Overal bed mobility: Needs Assistance Bed Mobility: Supine to Sit;Sit to Supine     Supine to sit: Supervision;HOB elevated Sit to supine: Min assist   General bed mobility comments: HOB raised, bedrail use, increased time, very minimal assist for LEs back into bed  Transfers Overall transfer level: Needs assistance Equipment used: 1 person hand held assist Transfers: Squat Pivot Transfers     Squat pivot transfers: Min assist;Min guard    Lateral/Scoot Transfers: Min assist General transfer comment: min assist toward R, min guard toward L    Balance Overall balance assessment: Needs assistance Sitting-balance support: Single extremity supported;Feet supported Sitting balance-Leahy Scale: Fair     Standing balance support: Single extremity supported;During functional activity Standing balance-Leahy Scale: Poor                            ADL either performed or assessed with clinical judgement   ADL Overall ADL's : Needs assistance/impaired Eating/Feeding: Set up;Bed level   Grooming: Wash/dry hands;Set up;Sitting   Upper Body Bathing: Moderate assistance;Sitting   Lower Body Bathing: Moderate assistance;Sitting/lateral leans   Upper Body Dressing : Moderate assistance;Sitting   Lower Body Dressing: Moderate assistance;Sitting/lateral leans   Toilet Transfer: Minimal assistance;Min guard;Squat-pivot;BSC   Toileting- Clothing Manipulation and Hygiene: Set up;Sitting/lateral lean Toileting - Clothing Manipulation Details (indicate cue type and reason): pericare only     Functional mobility during ADLs: (unable to ambulate)       Vision Patient Visual Report: No change from baseline       Perception     Praxis      Pertinent Vitals/Pain Pain Assessment: Faces Pain Score: 0-No pain Faces Pain Scale: Hurts whole lot Pain Location: R ankle Pain Descriptors / Indicators: Guarding;Grimacing Pain Intervention(s): Repositioned;Monitored during session;Ice applied     Hand Dominance Right   Extremity/Trunk Assessment Upper Extremity Assessment Upper Extremity Assessment: RUE deficits/detail RUE Deficits / Details: full AROM R elbow to hand, placed in sling RUE: Unable to fully assess due to immobilization RUE Coordination:  decreased gross motor   Lower Extremity Assessment Lower Extremity Assessment: Defer to PT evaluation RLE Deficits  / Details: painful, pt not putting weight through, ankle brace RLE: Unable to fully assess due to immobilization   Cervical / Trunk Assessment Cervical / Trunk Assessment: Normal   Communication Communication Communication: No difficulties   Cognition Arousal/Alertness: Awake/alert Behavior During Therapy: WFL for tasks assessed/performed Overall Cognitive Status: Within Functional Limits for tasks assessed                                     General Comments       Exercises     Shoulder Instructions      Home Living Family/patient expects to be discharged to:: Private residence Living Arrangements: Alone Available Help at Discharge: Family;Friend(s);Available PRN/intermittently Type of Home: House Home Access: Stairs to enter [employer] of Steps: 4 Entrance Stairs-Rails: Left;Right Home Layout: One level     Bathroom Shower/Tub: Tub/shower unit;Door   [employer] Toilet: Standard     Home Equipment: Kasandra Knudsen - single point;Adaptive equipment Adaptive Equipment: Reacher        Prior Functioning/Environment Level of Independence: Independent        Comments: pt involved in her church and likes to garden        OT Problem List: Decreased activity tolerance;Impaired balance (sitting and/or standing);Decreased knowledge of use of DME or AE;Pain      OT Treatment/Interventions: Self-care/ADL training;DME and/or AE instruction;Patient/family education;Balance training    OT Goals(Current goals can be found in the care plan section) Acute Rehab OT Goals Patient Stated Goal: Go home with [facility] services and family/church community support. OT Goal Formulation: With patient Time For Goal Achievement: 11/27/18 Potential to Achieve Goals: Good ADL Goals Pt Will Perform Upper Body Bathing: with modified independence;sitting Pt Will Perform Lower Body Bathing: with modified independence;sitting/lateral leans Pt Will Perform Upper Body Dressing:  sitting;with modified independence Pt Will Perform Lower Body Dressing: with modified independence;sitting/lateral leans Pt Will Transfer to Toilet: with modified independence;squat pivot transfer;stand pivot transfer;bedside commode Pt Will Perform Toileting - Clothing Manipulation and hygiene: with modified independence;sitting/lateral leans Pt/caregiver will Perform Home Exercise Program: Right Upper extremity;Independently;With written HEP provided(AROM elbow to hand) Additional ADL Goal #1: Pt will don and doff sling independently.  OT Frequency: Min 2X/week   Barriers to D/C: Decreased caregiver support          Co-evaluation              AM-PAC OT "6 Clicks" Daily Activity     Outcome Measure Help from another person eating meals?: A Little Help from another person taking care of personal grooming?: A Little Help from another person toileting, which includes using toliet, bedpan, or urinal?: A Lot Help from another person bathing (including washing, rinsing, drying)?: A Lot Help from another person to put on and taking off regular upper body clothing?: A Lot Help from another person to put on and taking off regular lower body clothing?: A Lot 6 Click Score: 14   End of Session Equipment Utilized During Treatment: Gait belt;Other (comment)(sling)  Activity Tolerance: Patient tolerated treatment well Patient left: in bed;with call bell/phone within reach;with bed alarm set  OT Visit Diagnosis: Unsteadiness on feet (R26.81);Pain;Muscle weakness (generalized) (M62.81) Pain - Right/Left: Right Pain - part of body: Ankle and joints of foot  Time: 4327-6147 OT Time Calculation (min): 63 min Charges:  OT General Charges $OT Visit: 1 Visit OT Evaluation $OT Eval Moderate Complexity: 1 Mod OT Treatments $Self Care/Home Management : 8-22 mins $Therapeutic Exercise: 8-22 mins  Nestor Lewandowsky, OTR/L Acute Rehabilitation Services Pager: [phone] Office:  [phone]  Malka So 11/13/2018, 3:50 PM

## 2018-11-14 DIAGNOSIS — W19XXXD Unspecified fall, subsequent encounter: Secondary | ICD-10-CM

## 2018-11-14 DIAGNOSIS — S42001D Fracture of unspecified part of right clavicle, subsequent encounter for fracture with routine healing: Secondary | ICD-10-CM

## 2018-11-14 MED ORDER — ALENDRONATE SODIUM 70 MG PO TABS: 70.0000 mg | ORAL_TABLET | ORAL | 0 refills | Status: DC

## 2018-11-14 MED ORDER — OXYCODONE HCL 5 MG PO TABS: 5.0000 mg | ORAL_TABLET | Freq: Four times a day (QID) | ORAL | 0 refills | Status: DC | PRN

## 2018-11-14 MED ORDER — ACETAMINOPHEN 325 MG PO TABS: 650.0000 mg | ORAL_TABLET | Freq: Four times a day (QID) | ORAL | Status: DC

## 2018-11-14 NOTE — Discharge Instructions (Signed)
PLEASE GO TO ORTHOPEDICS APPOINTMENT TOMORROW AT 3:15 PM AT [place] DR. Jeneen Rinks [place].    Clavicle Fracture  A clavicle fracture is a broken collarbone. The collarbone is the long bone that connects your shoulder to your chest wall. A broken collarbone may be treated with a sling or with surgery. Treatment depends on whether the broken ends of the bone are out of place or not. Follow these instructions at home: If you have a sling:  Wear the sling as told by your doctor. Take it off only as told by your doctor.  Loosen the sling if your fingers tingle, become numb, or turn cold and blue.  Do not lift your arm. Keep it across your chest.  Keep the sling clean.  Ask your doctor if you may take off the sling for bathing. ? If your sling is not waterproof, do not let it get wet. Cover the sling with a watertight covering if you take a bath or a shower while wearing it. ? If you may take off your sling when you take a bath or a shower, keep your shoulder in the same position as when the sling is on. Managing pain, stiffness, and swelling   If told, put ice on the injured area: ? If you have a removable sling, take it off as told by your doctor. ? Put ice in a plastic bag. ? Place a towel between your skin and the bag. ? Leave the ice on for 20 minutes, 2-3 times a day. Activity  Avoid activities that make your symptoms worse for 4-6 weeks, or as long as told.  Ask your doctor when it is safe for you to drive.  Do exercises as told by your doctor. General instructions  Do not use any products that contain nicotine or tobacco, such as cigarettes and e-cigarettes. These can delay bone healing. If you need help quitting, ask your doctor.  Take over-the-counter and prescription medicines only as told by your doctor.  Keep all follow-up visits as told by your doctor. This is important. Contact a doctor if:  Your medicine is not making you feel less pain.  Your  medicine is not making swelling better. Get help right away if:  Your cannot feel your arm (your arm is numb).  Your arm is cold.  Your arm is a lighter color than normal. Summary  A clavicle fracture is a broken collarbone. The collarbone is the long bone that connects your shoulder to your chest wall.  Treatment depends on whether the broken ends of the bone are out of place or not.  If you have a sling, wear it as told by your doctor.  Do exercises when your doctor says you can. The exercises will help your arm get strong and move like it used to. This information is not intended to replace advice given to you by your health care provider. Make sure you discuss any questions you have with your health care provider. Document Released: 10/07/2007 Document Revised: 04/02/2017 Document Reviewed: 03/09/2016 Elsevier Patient Education  2020 [place] in the Home, Adult Falls can cause injuries. They can happen to people of all ages. There are many things you can do to make your home safe and to help prevent falls. Ask for help when making these changes, if needed. What actions can I take to prevent falls? General Instructions  Use good lighting in all rooms. Replace any light bulbs that burn out.  Turn on the  lights when you go into a dark area. Use night-lights.  Keep items that you use often in easy-to-reach places. Lower the shelves around your home if necessary.  Set up your furniture so you have a clear path. Avoid moving your furniture around.  Do not have throw rugs and other things on the floor that can make you trip.  Avoid walking on wet floors.  If any of your floors are uneven, fix them.  Add color or contrast paint or tape to clearly mark and help you see: ? Any grab bars or handrails. ? First and last steps of stairways. ? Where the edge of each step is.  If you use a stepladder: ? Make sure that it is fully opened. Do not climb a closed  stepladder. ? Make sure that both sides of the stepladder are locked into place. ? Ask someone to hold the stepladder for you while you use it.  If there are any pets around you, be aware of where they are. What can I do in the bathroom?      Keep the floor dry. Clean up any water that spills onto the floor as soon as it happens.  Remove soap buildup in the tub or shower regularly.  Use non-skid mats or decals on the floor of the tub or shower.  Attach bath mats securely with double-sided, non-slip rug tape.  If you need to sit down in the shower, use a plastic, non-slip stool.  Install grab bars by the toilet and in the tub and shower. Do not use towel bars as grab bars. What can I do in the bedroom?  Make sure that you have a light by your bed that is easy to reach.  Do not use any sheets or blankets that are too big for your bed. They should not hang down onto the floor.  Have a firm chair that has side arms. You can use this for support while you get dressed. What can I do in the kitchen?  Clean up any spills right away.  If you need to reach something above you, use a strong step stool that has a grab bar.  Keep electrical cords out of the way.  Do not use floor polish or wax that makes floors slippery. If you must use wax, use non-skid floor wax. What can I do with my stairs?  Do not leave any items on the stairs.  Make sure that you have a light switch at the top of the stairs and the bottom of the stairs. If you do not have them, ask someone to add them for you.  Make sure that there are handrails on both sides of the stairs, and use them. Fix handrails that are broken or loose. Make sure that handrails are as long as the stairways.  Install non-slip stair treads on all stairs in your home.  Avoid having throw rugs at the top or bottom of the stairs. If you do have throw rugs, attach them to the floor with carpet tape.  Choose a carpet that does not hide the  edge of the steps on the stairway.  Check any carpeting to make sure that it is firmly attached to the stairs. Fix any carpet that is loose or worn. What can I do on the outside of my home?  Use bright outdoor lighting.  Regularly fix the edges of walkways and driveways and fix any cracks.  Remove anything that might make you trip as you  walk through a door, such as a raised step or threshold.  Trim any bushes or trees on the path to your home.  Regularly check to see if handrails are loose or broken. Make sure that both sides of any steps have handrails.  Install guardrails along the edges of any raised decks and porches.  Clear walking paths of anything that might make someone trip, such as tools or rocks.  Have any leaves, snow, or ice cleared regularly.  Use sand or salt on walking paths during winter.  Clean up any spills in your garage right away. This includes grease or oil spills. What other actions can I take?  Wear shoes that: ? Have a low heel. Do not wear high heels. ? Have rubber bottoms. ? Are comfortable and fit you well. ? Are closed at the toe. Do not wear open-toe sandals.  Use tools that help you move around (mobility aids) if they are needed. These include: ? Canes. ? Walkers. ? Scooters. ? Crutches.  Review your medicines with your doctor. Some medicines can make you feel dizzy. This can increase your chance of falling. Ask your doctor what other things you can do to help prevent falls. Where to find more information  Centers for Disease Control and Prevention, STEADI: https://garcia.biz/  [employer] on Aging: BrainJudge.co.uk Contact a doctor if:  You are afraid of falling at home.  You feel weak, drowsy, or dizzy at home.  You fall at home. Summary  There are many simple things that you can do to make your home safe and to help prevent falls.  Ways to make your home safe include removing tripping hazards and installing grab  bars in the bathroom.  Ask for help when making these changes in your home. This information is not intended to replace advice given to you by your health care provider. Make sure you discuss any questions you have with your health care provider. Document Released: 02/14/2009 Document Revised: 08/11/2018 Document Reviewed: 12/03/2016 Elsevier Patient Education  2020 [place].   IMPORTANT INFORMATION: PAY CLOSE ATTENTION   PHYSICIAN DISCHARGE INSTRUCTIONS  Follow with Primary care provider  Crist Infante, MD  and other consultants as instructed by your Hospitalist Physician  [place] IF SYMPTOMS COME BACK, WORSEN OR NEW PROBLEM DEVELOPS   Please note: You were cared for by a hospitalist during your hospital stay. Every effort will be made to forward records to your primary care provider.  You can request that your primary care provider send for your hospital records if they have not received them.  Once you are discharged, your primary care physician will handle any further medical issues. Please note that NO REFILLS for any discharge medications will be authorized once you are discharged, as it is imperative that you return to your primary care physician (or establish a relationship with a primary care physician if you do not have one) for your post hospital discharge needs so that they can reassess your need for medications and monitor your lab values.  Please get a complete blood count and chemistry panel checked by your Primary MD at your next visit, and again as instructed by your Primary MD.  Get Medicines reviewed and adjusted: Please take all your medications with you for your next visit with your Primary MD  Laboratory/radiological data: Please request your Primary MD to go over all hospital tests and procedure/radiological results at the follow up, please ask your primary care provider  to get all Hospital records sent to his/her office.  In  some cases, they will be blood work, cultures and biopsy results pending at the time of your discharge. Please request that your primary care provider follow up on these results.  If you are diabetic, please bring your blood sugar readings with you to your follow up appointment with primary care.    Please call and make your follow up appointments as soon as possible.    Also Note the following: If you experience worsening of your admission symptoms, develop shortness of breath, life threatening emergency, suicidal or homicidal thoughts you must seek medical attention immediately by calling 911 or calling your MD immediately  if symptoms less severe.  You must read complete instructions/literature along with all the possible adverse reactions/side effects for all the Medicines you take and that have been prescribed to you. Take any new Medicines after you have completely understood and accpet all the possible adverse reactions/side effects.   Do not drive when taking Pain medications or sleeping medications (Benzodiazepines)  Do not take more than prescribed Pain, Sleep and Anxiety Medications. It is not advisable to combine anxiety,sleep and pain medications without talking with your primary care practitioner  Special Instructions: If you have smoked or chewed Tobacco  in the last 2 yrs please stop smoking, stop any regular Alcohol  and or any Recreational drug use.  Wear Seat belts while driving.  Do not drive if taking any narcotic, mind altering or controlled substances or recreational drugs or alcohol.

## 2018-11-14 NOTE — TOC Transition Note (Signed)
Transition of Care [facility]) - CM/SW Discharge Note   Patient Details  Name: Susan [name] MRN: [id number] Date of Birth: 25-Nov-1933  Transition of [facility]) CM/SW Contact:  Dessa Phi, RN Phone Number: 11/14/2018, 12:24 PM   Clinical Narrative:  Patient declines SNF.Recc HHPT/OT/aide-chose [facility]-rep Cory aware. DME needed:3n1,straight angle cane-rep Zack from [employer] aware of dme to be delivered to rm prior d/c. Patient has family to transport home-she states around 4:30p. If taxi voucher needed can arrange-safety concern- patient will have to get dme out of taxi on own.      Barriers to Discharge: No Barriers Identified   Patient Goals and CMS Choice Patient states their goals for this hospitalization and ongoing recovery are:: go home CMS Medicare.gov Compare Post Acute Care list provided to:: Patient Choice offered to / list presented to : Patient  Discharge Placement                       Discharge Plan and Services                DME Arranged: 3-N-1, Kasandra Knudsen DME Agency: [employer] Date DME Agency Contacted: 11/14/18 Time DME Agency Contacted: 1221 Representative spoke with at DME Agency: [place]: PT, OT, Nurse's Aide [place] Agency: [place] Date [place]: 11/14/18 Time [place]: 1224 Representative spoke with at [place]: [place] Interventions     Readmission Risk Interventions No flowsheet data found.

## 2018-11-14 NOTE — Progress Notes (Signed)
HS Trop negative x 4.  Per Dr. Elmarie Shiley note from yesterday - no further ischemia workup needed. We will sign off and set up appt with Dr. Acie Fredrickson in 4-6 weeks.

## 2018-11-14 NOTE — Progress Notes (Signed)
Physical Therapy Treatment Patient Details Name: Susan [name] MRN: [id number] DOB: 02/09/1934 Today's Date: 11/14/2018    History of Present Illness Susan [name] is a 85 y.o. female with medical history significant of sCHF with EF of 30%, hyperlipidemia, hypothyroidism, CAD, stent placement, hypothyroidism, anxiety, pituitary adenoma, who presents after syncopal episode resulting in Right shoulder/scapula and right ankle pain. xrays negative for  right ankle fracture, found to have  right mid to distal clavicle fracture, R ankle sprain    PT Comments    Pt progressing well today. Pain much better controlled. Reviewed gait/safety/stairs. Continue to recommend assist/supervision prn at home, pt states her grand-daughter will be with her 24/7.  Pt will benefit from use of cane for incr gait stability and safety. Pt in agreement with plan. CM updated   Follow Up Recommendations  Home health PT;Supervision for mobility/OOB     Equipment Recommendations  Cane;3in1 (PT)    Recommendations for Other Services       Precautions / Restrictions Precautions Precautions: Fall Required Braces or Orthoses: Sling;Other Brace Other Brace: R ASO Restrictions Weight Bearing Restrictions: No    Mobility  Bed Mobility Overal bed mobility: Needs Assistance Bed Mobility: Rolling;Sidelying to Sit Rolling: Supervision Sidelying to sit: Supervision       General bed mobility comments: cues to roll to L and use LUE/elbow to push up and avoid reliance on bed rail. incr time, no physical assist,  HOB flat, no rails  Transfers Overall transfer level: Needs assistance Equipment used: 1 person hand held assist;None Transfers: Sit to/from Stand Sit to Stand: Min guard         General transfer comment: min-guard for safety, pt self cues for proper hand placement, reinforced taking extra time once in standing for safety  Ambulation/Gait Ambulation/Gait assistance: Min guard Gait Distance  (Feet): 50 Feet Assistive device: 1 person hand held assist;None Gait Pattern/deviations: Step-through pattern;Decreased stance time - right;Wide base of support Gait velocity: decreased   General Gait Details: min/guard for balance and safety, unsteady initially, gait stability improved with distance   Stairs Stairs: Yes Stairs assistance: Min guard;Supervision Stair Management: One rail Right;One rail Left;Step to pattern Number of Stairs: 5(x2) General stair comments: cues for sequence and safe technique   Wheelchair Mobility    Modified Rankin (Stroke Patients Only)       Balance           Standing balance support: No upper extremity supported;Single extremity supported;During functional activity Standing balance-Leahy Scale: Fair Standing balance comment: min/guard to close supervision  for safety without UE support during dynamic tasks                            Cognition Arousal/Alertness: Awake/alert Behavior During Therapy: WFL for tasks assessed/performed Overall Cognitive Status: Within Functional Limits for tasks assessed                                        Exercises      General Comments        Pertinent Vitals/Pain Pain Assessment: Faces Faces Pain Scale: Hurts a little bit Pain Location: R ankle Pain Descriptors / Indicators: Guarding;Grimacing Pain Intervention(s): Monitored during session    Home Living                      Prior Function  PT Goals (current goals can now be found in the care plan section) Acute Rehab PT Goals Patient Stated Goal: Go home with [facility] services and family/church community support. PT Goal Formulation: With patient Time For Goal Achievement: 11/27/18 Potential to Achieve Goals: Good Progress towards PT goals: Progressing toward goals    Frequency    Min 4X/week      PT Plan Current plan remains appropriate    Co-evaluation               AM-PAC PT "6 Clicks" Mobility   Outcome Measure  Help needed turning from your back to your side while in a flat bed without using bedrails?: None Help needed moving from lying on your back to sitting on the side of a flat bed without using bedrails?: None Help needed moving to and from a bed to a chair (including a wheelchair)?: None Help needed standing up from a chair using your arms (e.g., wheelchair or bedside chair)?: A Little Help needed to walk in hospital room?: A Little Help needed climbing 3-5 steps with a railing? : A Little 6 Click Score: 21    End of Session Equipment Utilized During Treatment: Gait belt Activity Tolerance: Patient tolerated treatment well Patient left: Other (comment)(with OT) Nurse Communication: Mobility status PT Visit Diagnosis: Unsteadiness on feet (R26.81);Other abnormalities of gait and mobility (R26.89);Repeated falls (R29.6)     Time: 1202-1219 PT Time Calculation (min) (ACUTE ONLY): 17 min  Charges:  $Gait Training: 8-22 mins                     Kenyon Ana, PT  Pager: [phone] Acute Rehab Dept [facility]): [phone]   11/14/2018    [facility] 11/14/2018, 12:30 PM

## 2018-11-14 NOTE — Discharge Summary (Addendum)
Physician Discharge Summary  Susan [name]:[id number] DOB: 05/13/1933 DOA: 11/12/2018  PCP: Crist Infante, MD Cardiologist: Dr. Acie Fredrickson  Admit date: 11/12/2018 Discharge date: 11/14/2018  Admitted From:  Home  Disposition: Home   Recommendations for Outpatient Follow-up:  1. Follow up with Dr. Jeannie Fend [facility] on 11/15/18 at 3:15pm as scheduled 2. Please follow up with PCP in 1 week.  3. Please follow up with cardiologist Dr. Acie Fredrickson in 4 months  4. Resume osteoporosis treatments for recurrent fracture prevention.   5. Please obtain BMP/CBC in one week  Home Health: PT. OT. RN. Aide. SW  Discharge Condition: STABLE   CODE STATUS: FULL    Brief Hospitalization Summary: Please see all hospital notes, images, labs for full details of the hospitalization.  Dr. Edgar Frisk HPI: Susan [name] is a 85 y.o. female with medical history significant of sCHF with EF of 30%, hyperlipidemia, hypothyroidism, CAD, stent placement, hypothyroidism, anxiety, pituitary adenoma, who presents with syncope.  Patient states that she passed out and fell in grocery store at about 4:30. Patient states that before passing out, she felt lightheaded and dizziness.  She injured her right shoulder and her right ankle causing severe pain in both location.  The pain is constant, severe, sharp, nonradiating, aggravated by movement. She denies unilateral weakness or numbness in extremities.  No facial droop or slurred speech.  No difficulty speaking.  Patient denies chest pain, shortness of breath, cough, fever or chills.  She had one mild loose stool bowel movement earlier, currently no nausea vomiting, diarrhea or abdominal pain.  No symptoms of UTI.  ED Course: pt was found to have high sensitive troponin 5.0, negative COVID-19, pending urinalysis, WBC 7.0, electrolytes renal function okay, temperature normal, blood pressure 143/83, no tachycardia, no tachypnea, oxygen saturation 99% on room air.  CT head is  negative for acute intracranial abnormalities.  X-ray of her right ankle is negative for bony fracture.  X-ray of right shoulder showed fracture in the mid to distal clavicle. Pt is placed on telemetry bed for observation.  Brief Admission Hx: 85 year old female with systolic CHF, EF 17%, hyperlipidemia, hypothyroidism, CAD with stent placement, hypothyroidism and pituitary adenoma presented with syncopal episode.  MDM/Assessment & Plan:   1. Orthostatic syncope- patient responded well to gentle hydration, cardiology is seen an echocardiogram and serial troponins are stable with no acute changes and cardiology has signed off no further workup needed in hospital.  MRI brain negative for any acute findings.  PT/OT recommending [facility] services which will be arranged. Pt declines SNF at this time.  2. Hypothyroidism-continue home levothyroxine. 3. Hyperlipidemia-continue Crestor daily. 4. Coronary artery disease status post stent placement-continue home cardiac medications.  Troponins are being serially tested. 5. Chronic systolic CHF-patient seems compensated at this time.  I have reduced her IV fluids.  She is currently not taking diuretics.  Follow electrolytes. 6. Osteoporosis - Pt has failed fosamax in past and now is taking prolia per her PCP.   7. Right distal clavicle fracture- patient reports that she is tolerating the sling at this time.  PT OT recommending SNF / HHPT, Pt declines SNF.  Continue pain management as ordered.  I made patient an appointment to see Dr. Harlin Heys with [facility] tomorrow 7/14 at 3:15 pm.  I gave the patient all of the information and she says she will go. Continue sling for now until she follows up.  Pt verbalized understanding.  Scheduled tylenol ordered.  Oxycodone ordered for severe pain symptoms.  8. Right ankle sprain - no fracture seen on imaging, continue supportive care and follow up with orthopedics tomorrow.   DVT prophylaxis: Lovenox  code  Status: Full Family Communication: Patient updated at bedside Disposition Plan: Home with [facility] services Pt declines SNF.   Consultants:  Cardiology  Procedures:  2D echocardiogram IMPRESSIONS  1. The left ventricle has mild-moderately reduced systolic function, with an ejection fraction of 40-45%. The cavity size was normal. Left ventricular diastolic Doppler parameters are consistent with impaired relaxation. 2. There is septal dyssynergy due to the LBBB. 3. The right ventricle has normal systolic function. The cavity was normal. There is no increase in right ventricular wall thickness. 4. Left atrial size was moderately dilated. 5. No evidence of mitral valve stenosis. 6. The aortic valve is tricuspid. Moderate thickening of the aortic valve. Moderate calcification of the aortic valve. Aortic valve regurgitation is mild by color flow Doppler. No stenosis of the aortic valve. 7. The aortic root and ascending aorta are normal in size and structure. 8. The interatrial septum was not assessed.  Discharge Diagnoses:  Principal Problem:   Syncope Active Problems:   Hypothyroidism   Hyperlipidemia   Coronary artery disease   Chronic systolic CHF (congestive heart failure) (HCC)   Fall   Right clavicle fracture   Syncope and collapse   Discharge Instructions: Discharge Instructions    Call MD for:  difficulty breathing, headache or visual disturbances   Complete by: As directed    Call MD for:  extreme fatigue   Complete by: As directed    Call MD for:  persistant dizziness or light-headedness   Complete by: As directed    Call MD for:  persistant nausea and vomiting   Complete by: As directed    Call MD for:  severe uncontrolled pain   Complete by: As directed    Increase activity slowly   Complete by: As directed      Allergies as of 11/14/2018   No Known Allergies     Medication List    STOP taking these medications   polyethylene glycol 17 g  packet Commonly known as: MIRALAX / GLYCOLAX   senna 8.6 MG Tabs tablet Commonly known as: SENOKOT     TAKE these medications   acetaminophen 325 MG tablet Commonly known as: TYLENOL Take 2 tablets (650 mg total) by mouth every 6 (six) hours. What changed:   when to take this  reasons to take this   CALCIUM + D PO Take 1 tablet by mouth at bedtime.   carvedilol 3.125 MG tablet Commonly known as: COREG Take 1 tablet by mouth twice daily What changed: when to take this   cholecalciferol 25 MCG (1000 UT) tablet Commonly known as: VITAMIN D3 Take 1,000 Units by mouth daily after breakfast.   clopidogrel 75 MG tablet Commonly known as: PLAVIX TAKE 1 TABLET BY MOUTH ONCE DAILY   Fish Oil 1200 MG Caps Take 1,200 mg by mouth at bedtime.   levothyroxine 75 MCG tablet Commonly known as: SYNTHROID Take 75 mcg by mouth daily before breakfast.   multivitamin-lutein Caps capsule Take 1 capsule by mouth 2 (two) times a day.   nitroGLYCERIN 0.4 MG SL tablet Commonly known as: NITROSTAT Place 1 tablet (0.4 mg total) under the tongue every 5 (five) minutes as needed.   oxyCODONE 5 MG immediate release tablet Commonly known as: Oxy IR/ROXICODONE Take 1 tablet (5 mg total) by mouth every 6 (six) hours as needed for severe pain.  rosuvastatin 40 MG tablet Commonly known as: CRESTOR TAKE 1 TABLET BY MOUTH ONCE DAILY What changed: when to take this   venlafaxine XR 150 MG 24 hr capsule Commonly known as: EFFEXOR-XR TAKE 1 CAPSULE BY MOUTH ONCE DAILY What changed: when to take this   vitamin B-12 1000 MCG tablet Commonly known as: CYANOCOBALAMIN Take 2,000 mcg by mouth daily.            Durable Medical Equipment  (From admission, onward)         Start     Ordered   11/14/18 1240  For home use only DME Cane  Once     11/14/18 1239   11/14/18 1240  For home use only DME 3 n 1  Once     11/14/18 1239         Follow-up Information    Nahser, Wonda Cheng, MD  Follow up.   Specialty: Cardiology Why: our office will call you with a follow-up appointment in 4 weeks  Contact information: [address] [place] [address] [phone]        Crist Infante, MD. Schedule an appointment as soon as possible for a visit in 1 week(s).   Specialty: Internal Medicine Why: Hospital Follow Up  Contact information: [address] [place] [address] [phone]        Verner Mould, MD. Go on 11/15/2018.   Why: PLEASE GO TO APPOINTMENT AT 3:15 PM.  PLEASE ARRIVE 15 MINUTES EARLY. Contact information: [address] [place] [address] [phone]          No Known Allergies Allergies as of 11/14/2018   No Known Allergies     Medication List    STOP taking these medications   polyethylene glycol 17 g packet Commonly known as: MIRALAX / GLYCOLAX   senna 8.6 MG Tabs tablet Commonly known as: SENOKOT     TAKE these medications   acetaminophen 325 MG tablet Commonly known as: TYLENOL Take 2 tablets (650 mg total) by mouth every 6 (six) hours. What changed:   when to take this  reasons to take this   CALCIUM + D PO Take 1 tablet by mouth at bedtime.   carvedilol 3.125 MG tablet Commonly known as: COREG Take 1 tablet by mouth twice daily What changed: when to take this   cholecalciferol 25 MCG (1000 UT) tablet Commonly known as: VITAMIN D3 Take 1,000 Units by mouth daily after breakfast.   clopidogrel 75 MG tablet Commonly known as: PLAVIX TAKE 1 TABLET BY MOUTH ONCE DAILY   Fish Oil 1200 MG Caps Take 1,200 mg by mouth at bedtime.   levothyroxine 75 MCG tablet Commonly known as: SYNTHROID Take 75 mcg by mouth daily before breakfast.   multivitamin-lutein Caps capsule Take 1 capsule by mouth 2 (two) times a day.   nitroGLYCERIN 0.4 MG SL tablet Commonly known as: NITROSTAT Place 1 tablet (0.4 mg total) under the tongue every 5 (five) minutes as needed.   oxyCODONE 5  MG immediate release tablet Commonly known as: Oxy IR/ROXICODONE Take 1 tablet (5 mg total) by mouth every 6 (six) hours as needed for severe pain.   rosuvastatin 40 MG tablet Commonly known as: CRESTOR TAKE 1 TABLET BY MOUTH ONCE DAILY What changed: when to take this   venlafaxine XR 150 MG 24 hr capsule Commonly known as: EFFEXOR-XR TAKE 1 CAPSULE BY MOUTH ONCE DAILY What changed: when to take this   vitamin  B-12 1000 MCG tablet Commonly known as: CYANOCOBALAMIN Take 2,000 mcg by mouth daily.            Durable Medical Equipment  (From admission, onward)         Start     Ordered   11/14/18 1240  For home use only DME Cane  Once     11/14/18 1239   11/14/18 1240  For home use only DME 3 n 1  Once     11/14/18 1239          Procedures/Studies: Dg Shoulder Right  Result Date: 11/12/2018 CLINICAL DATA:  Pain after fall earlier today EXAM: RIGHT SHOULDER - 2+ VIEW COMPARISON:  Chest x-ray February 04, 2017 FINDINGS: There is a mid to distal clavicular fracture not seen on the comparison x-ray. Limited views of the right chest are normal. Calcification superior to the humeral head is likely due to calcific tendinosis of the rotator cuff. Mild irregularity at the rotator cuff insertion site is likely degenerative. The patient is status post vertebroplasty of a midthoracic vertebral body. No other abnormalities. IMPRESSION: 1. Fracture in the mid to distal clavicle. This was not seen in October of 2019 and is worrisome for an acute fracture given history. No other abnormalities. Electronically Signed   By: Dorise Bullion III M.D   On: 11/12/2018 17:59   Dg Ankle 2 Views Right  Result Date: 11/12/2018 CLINICAL DATA:  Pain after fall EXAM: RIGHT ANKLE - 2 VIEW COMPARISON:  None. FINDINGS: There is no evidence of fracture, dislocation, or joint effusion. There is no evidence of arthropathy or other focal bone abnormality. Soft tissues are unremarkable. IMPRESSION: Negative.  Electronically Signed   By: Dorise Bullion III M.D   On: 11/12/2018 17:59   Ct Head Wo Contrast  Result Date: 11/12/2018 CLINICAL DATA:  Recent syncopal episode with head injury, initial encounter EXAM: CT HEAD WITHOUT CONTRAST TECHNIQUE: Contiguous axial images were obtained from the base of the skull through the vertex without intravenous contrast. COMPARISON:  02/01/2018 FINDINGS: Brain: Mild atrophic and chronic white matter ischemic changes are noted. No acute hemorrhage, acute infarction or space-occupying mass lesion are seen. Vascular: No hyperdense vessel or unexpected calcification. Skull: Normal. Negative for fracture or focal lesion. Sinuses/Orbits: Chronic opacification of the right maxillary antrum is seen. Other: None IMPRESSION: Chronic atrophic and ischemic changes without acute abnormality. Electronically Signed   By: Inez Catalina M.D.   On: 11/12/2018 18:08   Mr Brain Wo Contrast  Result Date: 11/13/2018 CLINICAL DATA:  Syncope. Cardiac etiology suspected. Personal history of myocardial infarction cardiac stents. EXAM: MRI HEAD WITHOUT CONTRAST TECHNIQUE: Multiplanar, multiecho pulse sequences of the brain and surrounding structures were obtained without intravenous contrast. COMPARISON:  CT head without contrast 11/12/2018. CT head without contrast 02/01/2018. MRI brain 01/04/2018. FINDINGS: Brain: The diffusion-weighted images demonstrate no acute or subacute infarction. There is no hemorrhage or mass lesion. Mild atrophy and white matter changes are similar the prior study, within normal limits for age. The ventricles are proportionate to the degree of atrophy. No significant extraaxial fluid collection is present. Dilated perivascular spaces are present within the basal ganglia. Brainstem and cerebellum are normal. Vascular: Flow is present in the major intracranial arteries. Skull and upper cervical spine: Degenerative changes the cervical spine are again noted with slight  retrolisthesis at C3-4. Sinuses/Orbits: Chronic right maxillary sinus opacification is present. Mild mucosal thickening is again noted in the anterior ethmoid air cells. Right mastoid effusion is new since  yesterday. No obstructing nasopharyngeal lesion is present. Bilateral lens replacements are noted. Globes and orbits are otherwise unremarkable. IMPRESSION: 1. Normal MRI appearance of the brain for age. 2. No acute or focal abnormality to explain syncopal episodes. 3. Chronic right maxillary sinus opacification. 4. New right mastoid effusion.  No obstructive lesions are present. Electronically Signed   By: San Morelle M.D.   On: 11/13/2018 11:30     Subjective: Pt c/o painful right shoulder otherwise no complaints, again declines SNF placement but willing to have Home health services.    Discharge Exam: Vitals:   11/13/18 2101 11/14/18 0534  BP: 116/62 132/74  Pulse: 69 65  Resp: 17 17  Temp: 99.3 F (37.4 C) 99 F (37.2 C)  SpO2: 94% 94%   Vitals:   11/13/18 1500 11/13/18 1809 11/13/18 2101 11/14/18 0534  BP:  123/62 116/62 132/74  Pulse:  69 69 65  Resp:  16 17 17   Temp:  (!) 97.4 F (36.3 C) 99.3 F (37.4 C) 99 F (37.2 C)  TempSrc:      SpO2: 94% 97% 94% 94%  Weight:    60.8 kg  Height:       General exam: Awake, alert, no apparent distress, cooperative. Respiratory system: Clear. No increased work of breathing. Cardiovascular system: S1 & S2 heard. No JVD, murmurs, gallops, clicks or pedal edema. Gastrointestinal system: Abdomen is nondistended, soft and nontender. Normal bowel sounds heard. Central nervous system: Alert and oriented. No focal neurological deficits. Extremities: Right shoulder in the sling, right ankle in brace.    The results of significant diagnostics from this hospitalization (including imaging, microbiology, ancillary and laboratory) are listed below for reference.     Microbiology: Recent Results (from the past 240 hour(s))  SARS  Coronavirus 2 (CEPHEID - Performed in [place] hospital lab), Hosp Order     Status: None   Collection Time: 11/12/18  6:33 PM   Specimen: Nasopharyngeal Swab  Result Value Ref Range Status   SARS Coronavirus 2 NEGATIVE NEGATIVE Final    Comment: (NOTE) If result is NEGATIVE SARS-CoV-2 target nucleic acids are NOT DETECTED. The SARS-CoV-2 RNA is generally detectable in upper and lower  respiratory specimens during the acute phase of infection. The lowest  concentration of SARS-CoV-2 viral copies this assay can detect is 250  copies / mL. A negative result does not preclude SARS-CoV-2 infection  and should not be used as the sole basis for treatment or other  patient management decisions.  A negative result may occur with  improper specimen collection / handling, submission of specimen other  than nasopharyngeal swab, presence of viral mutation(s) within the  areas targeted by this assay, and inadequate number of viral copies  (<250 copies / mL). A negative result must be combined with clinical  observations, patient history, and epidemiological information. If result is POSITIVE SARS-CoV-2 target nucleic acids are DETECTED. The SARS-CoV-2 RNA is generally detectable in upper and lower  respiratory specimens dur ing the acute phase of infection.  Positive  results are indicative of active infection with SARS-CoV-2.  Clinical  correlation with patient history and other diagnostic information is  necessary to determine patient infection status.  Positive results do  not rule out bacterial infection or co-infection with other viruses. If result is PRESUMPTIVE POSTIVE SARS-CoV-2 nucleic acids MAY BE PRESENT.   A presumptive positive result was obtained on the submitted specimen  and confirmed on repeat testing.  While 2019 novel coronavirus  (SARS-CoV-2) nucleic acids  may be present in the submitted sample  additional confirmatory testing may be necessary for epidemiological  and / or  clinical management purposes  to differentiate between  SARS-CoV-2 and other Sarbecovirus currently known to infect humans.  If clinically indicated additional testing with an alternate test  methodology [phone]) is advised. The SARS-CoV-2 RNA is generally  detectable in upper and lower respiratory sp ecimens during the acute  phase of infection. The expected result is Negative. Fact Sheet for Patients:  StrictlyIdeas.no Fact Sheet for Healthcare Providers: BankingDealers.co.za This test is not yet approved or cleared by the [place] FDA and has been authorized for detection and/or diagnosis of SARS-CoV-2 by FDA under an Emergency Use Authorization (EUA).  This EUA will remain in effect (meaning this test can be used) for the duration of the COVID-19 declaration under Section 564(b)(1) of the Act, 21 U.S.C. section 360bbb-3(b)(1), unless the authorization is terminated or revoked sooner. Performed at [facility], [place] [address], [place], [place] [address]      Labs: BNP (last 3 results) Recent Labs    11/12/18 2105  BNP 81.1   Basic Metabolic Panel: Recent Labs  Lab 11/12/18 1713 11/13/18 0532  NA 139 140  K 3.6 3.7  CL 105 112*  CO2 22 20*  GLUCOSE 160* 95  BUN 17 12  CREATININE 0.94 0.68  CALCIUM 9.6 8.3*   Liver Function Tests: Recent Labs  Lab 11/13/18 0857  AST 21  ALT 20  ALKPHOS 53  BILITOT 0.4  PROT 7.0  ALBUMIN 3.9   No results for input(s): LIPASE, AMYLASE in the last 168 hours. No results for input(s): AMMONIA in the last 168 hours. CBC: Recent Labs  Lab 11/12/18 1713 11/13/18 0532  WBC 7.0 7.2  HGB 13.4 12.0  HCT 41.1 38.1  MCV 100.7* 101.3*  PLT 223 193   Cardiac Enzymes: No results for input(s): CKTOTAL, CKMB, CKMBINDEX, TROPONINI in the last 168 hours. BNP: Invalid input(s): POCBNP CBG: Recent Labs  Lab 11/12/18 1824  GLUCAP 98   D-Dimer No results for  input(s): DDIMER in the last 72 hours. Hgb A1c No results for input(s): HGBA1C in the last 72 hours. Lipid Profile Recent Labs    11/13/18 0857  CHOL 136  HDL 49  LDLCALC 57  TRIG 148  CHOLHDL 2.8   Thyroid function studies No results for input(s): TSH, T4TOTAL, T3FREE, THYROIDAB in the last 72 hours.  Invalid input(s): FREET3 Anemia work up No results for input(s): VITAMINB12, FOLATE, FERRITIN, TIBC, IRON, RETICCTPCT in the last 72 hours. Urinalysis    Component Value Date/Time   COLORURINE YELLOW 11/12/2018 2019   APPEARANCEUR CLEAR 11/12/2018 2019   LABSPEC 1.011 11/12/2018 2019   PHURINE 7.0 11/12/2018 2019   GLUCOSEU NEGATIVE 11/12/2018 2019   HGBUR NEGATIVE 11/12/2018 2019   BILIRUBINUR NEGATIVE 11/12/2018 2019   KETONESUR NEGATIVE 11/12/2018 2019   PROTEINUR NEGATIVE 11/12/2018 2019   UROBILINOGEN 0.2 07/13/2011 2036   NITRITE NEGATIVE 11/12/2018 2019   LEUKOCYTESUR SMALL (A) 11/12/2018 2019   Sepsis Labs Invalid input(s): PROCALCITONIN,  WBC,  LACTICIDVEN Microbiology Recent Results (from the past 240 hour(s))  SARS Coronavirus 2 (CEPHEID - Performed in [place] hospital lab), Hosp Order     Status: None   Collection Time: 11/12/18  6:33 PM   Specimen: Nasopharyngeal Swab  Result Value Ref Range Status   SARS Coronavirus 2 NEGATIVE NEGATIVE Final    Comment: (NOTE) If result is NEGATIVE SARS-CoV-2 target nucleic acids are  NOT DETECTED. The SARS-CoV-2 RNA is generally detectable in upper and lower  respiratory specimens during the acute phase of infection. The lowest  concentration of SARS-CoV-2 viral copies this assay can detect is 250  copies / mL. A negative result does not preclude SARS-CoV-2 infection  and should not be used as the sole basis for treatment or other  patient management decisions.  A negative result may occur with  improper specimen collection / handling, submission of specimen other  than nasopharyngeal swab, presence of viral  mutation(s) within the  areas targeted by this assay, and inadequate number of viral copies  (<250 copies / mL). A negative result must be combined with clinical  observations, patient history, and epidemiological information. If result is POSITIVE SARS-CoV-2 target nucleic acids are DETECTED. The SARS-CoV-2 RNA is generally detectable in upper and lower  respiratory specimens dur ing the acute phase of infection.  Positive  results are indicative of active infection with SARS-CoV-2.  Clinical  correlation with patient history and other diagnostic information is  necessary to determine patient infection status.  Positive results do  not rule out bacterial infection or co-infection with other viruses. If result is PRESUMPTIVE POSTIVE SARS-CoV-2 nucleic acids MAY BE PRESENT.   A presumptive positive result was obtained on the submitted specimen  and confirmed on repeat testing.  While 2019 novel coronavirus  (SARS-CoV-2) nucleic acids may be present in the submitted sample  additional confirmatory testing may be necessary for epidemiological  and / or clinical management purposes  to differentiate between  SARS-CoV-2 and other Sarbecovirus currently known to infect humans.  If clinically indicated additional testing with an alternate test  methodology [phone]) is advised. The SARS-CoV-2 RNA is generally  detectable in upper and lower respiratory sp ecimens during the acute  phase of infection. The expected result is Negative. Fact Sheet for Patients:  StrictlyIdeas.no Fact Sheet for Healthcare Providers: BankingDealers.co.za This test is not yet approved or cleared by the [place] FDA and has been authorized for detection and/or diagnosis of SARS-CoV-2 by FDA under an Emergency Use Authorization (EUA).  This EUA will remain in effect (meaning this test can be used) for the duration of the COVID-19 declaration under Section 564(b)(1)  of the Act, 21 U.S.C. section 360bbb-3(b)(1), unless the authorization is terminated or revoked sooner. Performed at [facility], [place] [address], [place], [place] [address]    Time coordinating discharge: 32 minutes   SIGNED:  Irwin Brakeman, MD  [facility] 11/14/2018, 12:41 PM How to contact the [facility] Attending or Consulting provider [place] or covering provider during after hours [place], for this patient?  1. Check the care team in [facility] and look for a) attending/consulting [facility] provider listed and b) the [facility] team listed 2. Log into [email or web address] and use [place]'s universal password to access. If you do not have the password, please contact the hospital operator. 3. Locate the [facility] provider you are looking for under [facility] and page to a number that you can be directly reached. 4. If you still have difficulty reaching the provider, please page the [facility] (Director on Call) for the Hospitalists listed on amion for assistance.

## 2018-11-14 NOTE — Progress Notes (Signed)
Patient is stable for discharge. Discharge instructions and medications have been reviewed with the patient and all questions answered. AVS and prescriptions given to patient.  

## 2018-11-14 NOTE — Progress Notes (Signed)
Occupational Therapy Treatment Patient Details Name: Susan [name] MRN: [id number] DOB: 03/01/1934 Today's Date: 11/14/2018    History of present illness Susan [name] is a 85 y.o. female with medical history significant of sCHF with EF of 30%, hyperlipidemia, hypothyroidism, CAD, stent placement, hypothyroidism, anxiety, pituitary adenoma, who presents after syncopal episode resulting in Right shoulder/scapula and right ankle pain. xrays negative for  right ankle fracture, found to have  right mid to distal clavicle fracture, R ankle sprain   OT comments  This 85 yo female admitted with above presents to acute OT with making progress towards goals and will have grand-daughter staying with her a few days/nights to A prn. All acute OT education has been completed and pt will continue with HHOT. Acute OT will sign off.  Follow Up Recommendations  Home health OT;Supervision/Assistance - 24 hour    Equipment Recommendations  3 in 1 bedside commode       Precautions / Restrictions Precautions Precautions: Fall Required Braces or Orthoses: Sling;Other Brace Other Brace: R ASO Restrictions Weight Bearing Restrictions: No       Mobility Bed Mobility Overal bed mobility: Needs Assistance Bed Mobility: Rolling;Sidelying to Sit Rolling: Supervision Sidelying to sit: Supervision       General bed mobility comments: cues to roll to L and use LUE/elbow to push up and avoid reliance on bed rail. incr time, no physical assist,  HOB flat, no rails  Transfers Overall transfer level: Needs assistance Equipment used: 1 person hand held assist;None Transfers: Sit to/from Stand Sit to Stand: Min guard         General transfer comment: min-guard for safety, pt self cues for proper hand placement, reinforced taking extra time once in standing for safety    Balance Overall balance assessment: Needs assistance Sitting-balance support: No upper extremity supported;Feet supported Sitting  balance-Leahy Scale: Good     Standing balance support: No upper extremity supported;Single extremity supported;During functional activity Standing balance-Leahy Scale: Fair Standing balance comment: min/guard to close supervision  for safety without UE support during dynamic tasks                           ADL either performed or assessed with clinical judgement   ADL                                         General ADL Comments: Pt able to doff and donn her socks Mod I with LUE only. Pt needs A donning and doffing her ASO. We discussed with shirts that she needs to put her RUE in first. She was able to doff and don her sling by herself. Pt is Min guard A for all functional mobility.     Vision Patient Visual Report: No change from baseline            Cognition Arousal/Alertness: Awake/alert Behavior During Therapy: WFL for tasks assessed/performed Overall Cognitive Status: Within Functional Limits for tasks assessed                                                     Pertinent Vitals/ Pain       Pain Assessment: Faces Faces Pain Scale: Hurts a  little bit Pain Location: R ankle Pain Descriptors / Indicators: Guarding;Grimacing Pain Intervention(s): Monitored during session;Repositioned;Ice applied         Frequency  Min 2X/week        Progress Toward Goals  OT Goals(current goals can now be found in the care plan section)  Progress towards OT goals: Progressing toward goals  Acute Rehab OT Goals Patient Stated Goal: Go home with [facility] services and family/church community support.  Plan Discharge plan needs to be updated       AM-PAC OT "6 Clicks" Daily Activity     Outcome Measure   Help from another person eating meals?: None Help from another person taking care of personal grooming?: A Little Help from another person toileting, which includes using toliet, bedpan, or urinal?: A Little Help from another  person bathing (including washing, rinsing, drying)?: A Little Help from another person to put on and taking off regular upper body clothing?: A Little Help from another person to put on and taking off regular lower body clothing?: A Little 6 Click Score: 19    End of Session Equipment Utilized During Treatment: Gait belt(sling and ASO)  OT Visit Diagnosis: Unsteadiness on feet (R26.81);Pain;Muscle weakness (generalized) (M62.81) Pain - Right/Left: Right Pain - part of body: Ankle and joints of foot   Activity Tolerance Patient tolerated treatment well   Patient Left in chair;with call bell/phone within reach           Time: 1219-1233 OT Time Calculation (min): 14 min  Charges: OT General Charges $OT Visit: 1 Visit OT Treatments $Self Care/Home Management : 8-22 mins  [place], OTR/L Acute Rehab Services Pager [phone] Office [phone]      Almon Register 11/14/2018, 12:51 PM

## 2018-11-14 NOTE — TOC Progression Note (Signed)
Transition of Care [facility]) - Progression Note    Patient Details  Name: Susan [name] MRN: [id number] Date of Birth: 09-Jan-1934  [facility] CM/SW Contact  Joselin Crandell, Juliann Pulse, RN Phone Number: 11/14/2018, 12:54 PM  Clinical Narrative: Patient states she has church family to asst w/private duty care if needed. Informed her of custodial level care(out of pocket cist,independent decision) patient voiced understanding.        Barriers to Discharge: No Barriers Identified  Expected Discharge Plan and Services           Expected Discharge Date: 11/14/18               DME Arranged: [place] DME Agency: [employer] Date DME Agency Contacted: 11/14/18 Time DME Agency Contacted: 1221 Representative spoke with at DME Agency: [place]: PT, OT, Nurse's Aide [place] Agency: [place] Date [place]: 11/14/18 Time [place]: 1224 Representative spoke with at [place]: [place] ([place]) Interventions    Readmission Risk Interventions No flowsheet data found.

## 2018-11-15 DIAGNOSIS — H748X1 Other specified disorders of right middle ear and mastoid: Secondary | ICD-10-CM | POA: Diagnosis not present

## 2018-11-15 DIAGNOSIS — E039 Hypothyroidism, unspecified: Secondary | ICD-10-CM | POA: Diagnosis not present

## 2018-11-15 DIAGNOSIS — Z79891 Long term (current) use of opiate analgesic: Secondary | ICD-10-CM | POA: Diagnosis not present

## 2018-11-15 DIAGNOSIS — I351 Nonrheumatic aortic (valve) insufficiency: Secondary | ICD-10-CM | POA: Diagnosis not present

## 2018-11-15 DIAGNOSIS — S93401D Sprain of unspecified ligament of right ankle, subsequent encounter: Secondary | ICD-10-CM | POA: Diagnosis not present

## 2018-11-15 DIAGNOSIS — F419 Anxiety disorder, unspecified: Secondary | ICD-10-CM | POA: Diagnosis not present

## 2018-11-15 DIAGNOSIS — I2510 Atherosclerotic heart disease of native coronary artery without angina pectoris: Secondary | ICD-10-CM | POA: Diagnosis not present

## 2018-11-15 DIAGNOSIS — S42001A Fracture of unspecified part of right clavicle, initial encounter for closed fracture: Secondary | ICD-10-CM | POA: Diagnosis not present

## 2018-11-15 DIAGNOSIS — R55 Syncope and collapse: Secondary | ICD-10-CM | POA: Diagnosis not present

## 2018-11-15 DIAGNOSIS — I5032 Chronic diastolic (congestive) heart failure: Secondary | ICD-10-CM | POA: Diagnosis not present

## 2018-11-15 DIAGNOSIS — D352 Benign neoplasm of pituitary gland: Secondary | ICD-10-CM | POA: Diagnosis not present

## 2018-11-15 DIAGNOSIS — I447 Left bundle-branch block, unspecified: Secondary | ICD-10-CM | POA: Diagnosis not present

## 2018-11-15 DIAGNOSIS — H43813 Vitreous degeneration, bilateral: Secondary | ICD-10-CM | POA: Diagnosis not present

## 2018-11-15 DIAGNOSIS — I429 Cardiomyopathy, unspecified: Secondary | ICD-10-CM | POA: Diagnosis not present

## 2018-11-15 DIAGNOSIS — E785 Hyperlipidemia, unspecified: Secondary | ICD-10-CM | POA: Diagnosis not present

## 2018-11-15 DIAGNOSIS — H353190 Nonexudative age-related macular degeneration, unspecified eye, stage unspecified: Secondary | ICD-10-CM | POA: Diagnosis not present

## 2018-11-15 DIAGNOSIS — S42021D Displaced fracture of shaft of right clavicle, subsequent encounter for fracture with routine healing: Secondary | ICD-10-CM | POA: Diagnosis not present

## 2018-11-24 DIAGNOSIS — E7849 Other hyperlipidemia: Secondary | ICD-10-CM | POA: Diagnosis not present

## 2018-11-25 DIAGNOSIS — D352 Benign neoplasm of pituitary gland: Secondary | ICD-10-CM | POA: Diagnosis not present

## 2018-11-25 DIAGNOSIS — F3341 Major depressive disorder, recurrent, in partial remission: Secondary | ICD-10-CM | POA: Diagnosis not present

## 2018-11-25 DIAGNOSIS — M810 Age-related osteoporosis without current pathological fracture: Secondary | ICD-10-CM | POA: Diagnosis not present

## 2018-11-25 DIAGNOSIS — I2510 Atherosclerotic heart disease of native coronary artery without angina pectoris: Secondary | ICD-10-CM | POA: Diagnosis not present

## 2018-12-02 DIAGNOSIS — Z961 Presence of intraocular lens: Secondary | ICD-10-CM | POA: Diagnosis not present

## 2018-12-02 DIAGNOSIS — H43813 Vitreous degeneration, bilateral: Secondary | ICD-10-CM | POA: Diagnosis not present

## 2018-12-02 DIAGNOSIS — H353131 Nonexudative age-related macular degeneration, bilateral, early dry stage: Secondary | ICD-10-CM | POA: Diagnosis not present

## 2018-12-05 ENCOUNTER — Other Ambulatory Visit: Payer: Self-pay | Admitting: Cardiovascular Disease

## 2018-12-09 DIAGNOSIS — S42001A Fracture of unspecified part of right clavicle, initial encounter for closed fracture: Secondary | ICD-10-CM | POA: Diagnosis not present

## 2018-12-11 NOTE — Progress Notes (Signed)
Cardiology Office Note:    Date:  12/12/2018   ID:  Susan [name], DOB 08/20/1933, MRN [id number]  PCP:  Crist Infante, MD  Cardiologist:  Mertie Moores, MD  Referring MD: Crist Infante, MD   Chief Complaint  Patient presents with  . Near Syncope  . Congestive Heart Failure  . Coronary Artery Disease    History of Present Illness:    Susan [name] is a 85 y.o. female with a past medical history significant for CAD with stents in ~0932, chronic systolic heart failure, LBBB, carotid artery disease, hyperlipidemia and hypothyroidism.  She was found to have a low EF in 09/2017 by echo and underwent right and left heart cath on 10/08/2017 with finding of moderate to severe mid LAD stenosis (60-70%), slightly worse than prior cath in 2012.  She had mild nonobstructive disease of the left circumflex and RCA.  Mid and distal RCA stents widely patent.  There was severely reduced LVEF 25-30% and global hypokinesis.  Degree of cardiomyopathy was felt to be out of proportion to her CAD and suggested underlying nonischemic cardiomyopathy.  She was started on carvedilol 3.125 mg twice daily with plans for further optimization of heart failure therapy as an outpatient.  She was referred to EP for consideration of ICD in 01/2018.  She was noted to have left bundle branch block but QRS only about 130.  Her heart failure symptoms were class I.  Dr. Lovena Le felt there was no indication for Bi V PPM insertion.  He noted that if she were to develop syncope or worsening CHF then Bi V PPM would be a strong consideration.  Also if her QRS widened to over 150 or she develops symptomatic heart block then PPM insertion would be consideration.  He noted that with her advanced age he did not consider her a candidate for prophylactic ICD insertion.  Susan [name] was [facility] to [facility] 11/12/2018 with near syncope and symptoms consistent with orthostasis.  Today she tells me that she had gone to the store around 4  in the afternoon and was rushing. She had only eaten a little watermelon for lunch. She had a feeling like she was going to pass and she tried to ease onto the floor but ended up falling onto her right side. She fractured her clavicle on the right and sprained her ankle. She had no chest pain or pressure, no palpitations.  Troponins were negative and EKG was without changes.  There were no arrhythmias noted.  Her CT scan was normal.  Susan [name] is here for hospital follow up. She has had no further spells. She is concentrating on eating better and keeping hydrated. No chest pain, DOE, orthopnea, PND or significant edema.   She was still playing tennis until this episode in July. She was walking about 45 minutes per day until her fall. She is still walking but not as much, about 15 minutes, due to her sprained ankle. She was advised to give up tennis due to ankle injury.    Past Medical History:  Diagnosis Date  . Coronary artery disease    POST PTCA AND STENTING OF HER RIGHT CORONARY ARTERY  . Hypothyroidism   . LAD stenosis    MODERATE 50-60% STENOSIS  . Osteoporosis     Past Surgical History:  Procedure Laterality Date  . APPENDECTOMY  1970s?   "ruptured"   . BACK SURGERY    . CATARACT EXTRACTION W/ INTRAOCULAR LENS  IMPLANT, BILATERAL Bilateral   .  CORONARY ANGIOPLASTY WITH STENT PLACEMENT  ~ 2009   "2 stents"  . FRACTURE SURGERY    . IR VERTEBROPLASTY CERV/THOR BX INC UNI/BIL INC/INJECT/IMAGING  02/07/2018  . KNEE ARTHROSCOPY Right     torn meniscus  . LUMBAR LAMINECTOMY  1978  . OVARIAN CYST SURGERY  1970s?   "ruptured"   . RIGHT/LEFT HEART CATH AND CORONARY ANGIOGRAPHY N/A 10/08/2017   Procedure: RIGHT/LEFT HEART CATH AND CORONARY ANGIOGRAPHY;  Surgeon: Nelva Bush, MD;  Location: [facility];  Service: Cardiovascular;  Laterality: N/A;  . WRIST FRACTURE SURGERY Bilateral    "2 surgeries for 2 breaks on left side; 1 surgery for 1 break on right wrist"    Current  Medications: Current Meds  Medication Sig  . acetaminophen (TYLENOL) 325 MG tablet Take 2 tablets (650 mg total) by mouth every 6 (six) hours.  . Calcium Carbonate-Vitamin D (CALCIUM + D PO) Take 2 tablets by mouth at bedtime.   . carvedilol (COREG) 3.125 MG tablet Take 1 tablet by mouth twice daily  . cholecalciferol (VITAMIN D3) 25 MCG (1000 UT) tablet Take 1,000 Units by mouth daily after breakfast.  . clopidogrel (PLAVIX) 75 MG tablet Take 1 tablet (75 mg total) by mouth daily. Pt needs to keep appt in Aug to get further refills  . levothyroxine (SYNTHROID, LEVOTHROID) 75 MCG tablet Take 75 mcg by mouth daily before breakfast.   . multivitamin-lutein (OCUVITE-LUTEIN) CAPS capsule Take 1 capsule by mouth 2 (two) times a day.   . nitroGLYCERIN (NITROSTAT) 0.4 MG SL tablet Place 1 tablet (0.4 mg total) under the tongue every 5 (five) minutes as needed.  . Omega-3 Fatty Acids (FISH OIL) 1200 MG CAPS Take 1,200 mg by mouth at bedtime.  . rosuvastatin (CRESTOR) 40 MG tablet TAKE 1 TABLET BY MOUTH ONCE DAILY  . venlafaxine XR (EFFEXOR-XR) 150 MG 24 hr capsule TAKE 1 CAPSULE BY MOUTH ONCE DAILY  . vitamin B-12 (CYANOCOBALAMIN) 1000 MCG tablet Take 2,000 mcg by mouth daily.      Allergies:   Patient has no known allergies.   Social History   Socioeconomic History  . Marital status: Widowed    Spouse name: Not on file  . Number of children: Not on file  . Years of education: Not on file  . Highest education level: Not on file  Occupational History  . Not on file  Social Needs  . Financial resource strain: Not on file  . Food insecurity    Worry: Not on file    Inability: Not on file  . Transportation needs    Medical: Not on file    Non-medical: Not on file  Tobacco Use  . Smoking status: Never Smoker  . Smokeless tobacco: Never Used  Substance and Sexual Activity  . Alcohol use: Yes    Alcohol/week: 1.0 standard drinks    Types: 1 Glasses of wine per week  . Drug use: Never  .  Sexual activity: Not on file  Lifestyle  . Physical activity    Days per week: Not on file    Minutes per session: Not on file  . Stress: Not on file  Relationships  . Social Herbalist on phone: Not on file    Gets together: Not on file    Attends religious service: Not on file    Active member of club or organization: Not on file    Attends meetings of clubs or organizations: Not on file  Relationship status: Not on file  Other Topics Concern  . Not on file  Social History Narrative  . Not on file     Family History: The patient's family history includes Alzheimer's disease in her mother; Aneurysm in her sister; Heart attack in her father. ROS:   Please see the history of present illness.     All other systems reviewed and are negative.  EKGs/Labs/Other Studies Reviewed:    The following studies were reviewed today:  Echocardiogram 11/13/2018 IMPRESSIONS  1. The left ventricle has mild-moderately reduced systolic function, with an ejection fraction of 40-45%. The cavity size was normal. Left ventricular diastolic Doppler parameters are consistent with impaired relaxation.  2. There is septal dyssynergy due to the LBBB.  3. The right ventricle has normal systolic function. The cavity was normal. There is no increase in right ventricular wall thickness.  4. Left atrial size was moderately dilated.  5. No evidence of mitral valve stenosis.  6. The aortic valve is tricuspid. Moderate thickening of the aortic valve. Moderate calcification of the aortic valve. Aortic valve regurgitation is mild by color flow Doppler. No stenosis of the aortic valve.  7. The aortic root and ascending aorta are normal in size and structure.  8. The interatrial septum was not assessed.  Echocardiogram 12/31/2017: LVEF 30-35%  RIGHT/LEFT HEART CATH AND CORONARY ANGIOGRAPHY 10/08/2017   Conclusions: 1. Moderate to severe mid LAD stenosis (60-70%), slightly worsen than prior  catheterization in 2012. 2. Mild, non-obstructive LCx and RCA disease. Mid and distal RCA stents are widely patent. 3. Severely reduced LVEF with global hypokinesis. 4. Normal left heart, right heart, and pulmonary artery pressures.  Recommendations: 1. Medical therapy. Will add carvedilol 3.125 mg BID with plans for further optimization of evidence based heart failure therapy as an outpatient. 2. Aggressive secondary prevention of coronary artery disease. 3. Medical management of LAD disease, which is only slightly worse than in 2012. Degree of cardiomyopathy is out of proportion to coronary artery disease and suggests underlying non-ischemic cardiomyopathy.  Nelva Bush, MD   Echocardiogram 09/24/2017: EF down to 30-35% from 55%.   EKG:  EKG is not ordered today.   Recent Labs: 02/01/2018: TSH 0.202 02/06/2018: Magnesium 2.2 11/12/2018: B Natriuretic Peptide 91.0 11/13/2018: ALT 20; BUN 12; Creatinine, Ser 0.68; Hemoglobin 12.0; Platelets 193; Potassium 3.7; Sodium 140   Recent Lipid Panel    Component Value Date/Time   CHOL 136 11/13/2018 0857   TRIG 148 11/13/2018 0857   HDL 49 11/13/2018 0857   CHOLHDL 2.8 11/13/2018 0857   VLDL 30 11/13/2018 0857   LDLCALC 57 11/13/2018 0857    Physical Exam:    VS:  BP 108/70   Pulse 80   Ht 5\' 3"  (1.6 m)   Wt 134 lb 1.9 oz (60.8 kg)   SpO2 96%   BMI 23.76 kg/m     Wt Readings from Last 3 Encounters:  12/12/18 134 lb 1.9 oz (60.8 kg)  11/14/18 134 lb 0.6 oz (60.8 kg)  01/31/18 127 lb (57.6 kg)     Physical Exam  Constitutional: She is oriented to person, place, and time. She appears well-developed and well-nourished. No distress.  HENT:  Head: Normocephalic and atraumatic.  Neck: Normal range of motion. Neck supple. No JVD present.  Cardiovascular: Normal rate, regular rhythm, normal heart sounds and intact distal pulses. Exam reveals no gallop and no friction rub.  No murmur heard. Pulmonary/Chest: Effort normal and  breath sounds normal. No respiratory distress. She  has no wheezes. She has no rales.  Abdominal: Soft. Bowel sounds are normal.  Musculoskeletal:     Comments: Mild edema of right ankle related to injury. Right arm in a sling and right ankle with soft support.   Neurological: She is alert and oriented to person, place, and time.  Skin: Skin is warm and dry.  Psychiatric: She has a normal mood and affect. Her behavior is normal. Judgment and thought content normal.  Nursing note and vitals reviewed.   ASSESSMENT:    1. Postural dizziness with near syncope   2. Chronic systolic CHF (congestive heart failure) ([place])   3. Coronary artery disease involving native coronary artery of native heart without angina pectoris   4. Hyperlipidemia, unspecified hyperlipidemia type   5. Bilateral carotid artery stenosis    PLAN:    In order of problems listed above:  Fall with near syncope -Patient with an episode of near syncope and fall in the setting of being out rushing around in the heat with very little to eat or drink earlier that day.  Most likely orthostatic in nature. -Patient says that she is now paying attention to eating and drinking sufficiently.  She has had no further episodes. -We will continue to watch her symptoms.  She will notify us if this happens again.  Chronic systolic heart disease -EF down to 30-35% in 2019.  Patient started on low-dose beta-blocker.  Cardiomyopathy felt to be out of proportion to her CAD thus having a nonischemic component. -Has left bundle branch block, recent QRS duration 133. -EP did not feel that ICD or biventricular pacemaker were indicated when seen in 2019. -Most recent echocardiogram with slightly improved EF 40-45% by echo 11/13/2018 -Medical therapy has been somewhat limited due to soft blood pressures.  She is on low-dose carvedilol but no ACE inhibitor or ARB. She is only taking the carvedilol once a day at night due to low BP and I think this is  reasonable given her fragility with blood pressure and activity tolerance during the day.  -Euvolemic and fairly active.   CAD S/P RCA stenting in 2005 -Right and left heart cath in 2019 revealed moderate to severe mid LAD stenosis of 60-70% which was slightly worse than heart cath in 2012.  Being treated medically. -She continues on Plavix, high intensity statin -The patient is fairly active without any anginal symptoms.  She had no chest pain or shortness of breath surrounding her near syncopal episode.  Hyperlipidemia -On Crestor 40 mg, , fish oil, followed by her PCP -Lipid panel 11/13/2018 showed TC 136, HDL 49, LDL 57, triglycerides 148 -Well-controlled.  Continue current therapy.  Carotid artery disease, bilateral -Moderate carotid artery disease by ultrasound in 2013 , last ultrasound in 11/2017 showed mild bilateral carotid disease. -On Plavix and statin.  Medication Adjustments/Labs and Tests Ordered: Current medicines are reviewed at length with the patient today.  Concerns regarding medicines are outlined above. Labs and tests ordered and medication changes are outlined in the patient instructions below:  Patient Instructions  Medication Instructions:  Your physician recommends that you continue on your current medications as directed. Please refer to the Current Medication list given to you today.  If you need a refill on your cardiac medications before your next appointment, please call your pharmacy.   Lab work: None  If you have labs (blood work) drawn today and your tests are completely normal, you will receive your results only by: Marland Kitchen MyChart Message (if you have MyChart)  OR . A paper copy in the mail If you have any lab test that is abnormal or we need to change your treatment, we will call you to review the results.  Testing/Procedures: None   Follow-Up: At [facility], you and your health needs are our priority.  As part of our continuing mission to provide  you with exceptional heart care, we have created designated Provider Care Teams.  These Care Teams include your primary Cardiologist (physician) and Advanced Practice Providers (APPs -  Physician Assistants and Nurse Practitioners) who all work together to provide you with the care you need, when you need it. You will need a follow up appointment in:  6 months.  Please call our office 2 months in advance to schedule this appointment.  You may see Mertie Moores, MD or one of the following Advanced Practice Providers on your designated Care Team: Richardson Dopp, PA-C [place], [place] . Daune Perch, NP  Any Other Special Instructions Will Be Listed Below (If Applicable).   Symptoms to report to cardiology: increase of weight 3-5 pounds in a day or week, increased shortness of breath with exertion, trouble lying flat due to shortness of breath, lower leg swelling.                        Pecolia Ades, NP       Signed, Daune Perch, NP  12/12/2018 1:49 PM     [facility]

## 2018-12-12 ENCOUNTER — Other Ambulatory Visit: Payer: Self-pay

## 2018-12-12 ENCOUNTER — Encounter: Payer: Self-pay | Admitting: Cardiology

## 2018-12-12 ENCOUNTER — Ambulatory Visit (INDEPENDENT_AMBULATORY_CARE_PROVIDER_SITE_OTHER): Payer: Medicare Other | Admitting: Cardiology

## 2018-12-12 VITALS — BP 108/70 | HR 80 | Ht 63.0 in | Wt 134.1 lb

## 2018-12-12 DIAGNOSIS — R42 Dizziness and giddiness: Secondary | ICD-10-CM | POA: Diagnosis not present

## 2018-12-12 DIAGNOSIS — E785 Hyperlipidemia, unspecified: Secondary | ICD-10-CM

## 2018-12-12 DIAGNOSIS — I5022 Chronic systolic (congestive) heart failure: Secondary | ICD-10-CM | POA: Diagnosis not present

## 2018-12-12 DIAGNOSIS — I2510 Atherosclerotic heart disease of native coronary artery without angina pectoris: Secondary | ICD-10-CM | POA: Diagnosis not present

## 2018-12-12 DIAGNOSIS — R55 Syncope and collapse: Secondary | ICD-10-CM

## 2018-12-12 DIAGNOSIS — I6523 Occlusion and stenosis of bilateral carotid arteries: Secondary | ICD-10-CM

## 2018-12-12 NOTE — Patient Instructions (Addendum)
Medication Instructions:  Your physician recommends that you continue on your current medications as directed. Please refer to the Current Medication list given to you today.  If you need a refill on your cardiac medications before your next appointment, please call your pharmacy.   Lab work: None  If you have labs (blood work) drawn today and your tests are completely normal, you will receive your results only by: Marland Kitchen MyChart Message (if you have MyChart) OR . A paper copy in the mail If you have any lab test that is abnormal or we need to change your treatment, we will call you to review the results.  Testing/Procedures: None   Follow-Up: At [facility], you and your health needs are our priority.  As part of our continuing mission to provide you with exceptional heart care, we have created designated Provider Care Teams.  These Care Teams include your primary Cardiologist (physician) and Advanced Practice Providers (APPs -  Physician Assistants and Nurse Practitioners) who all work together to provide you with the care you need, when you need it. You will need a follow up appointment in:  6 months.  Please call our office 2 months in advance to schedule this appointment.  You may see Mertie Moores, MD or one of the following Advanced Practice Providers on your designated Care Team: Richardson Dopp, PA-C [place], [place] . Daune Perch, NP  Any Other Special Instructions Will Be Listed Below (If Applicable).   Symptoms to report to cardiology: increase of weight 3-5 pounds in a day or week, increased shortness of breath with exertion, trouble lying flat due to shortness of breath, lower leg swelling.                        Pecolia Ades, NP

## 2018-12-15 DIAGNOSIS — I2510 Atherosclerotic heart disease of native coronary artery without angina pectoris: Secondary | ICD-10-CM | POA: Diagnosis not present

## 2018-12-15 DIAGNOSIS — E039 Hypothyroidism, unspecified: Secondary | ICD-10-CM | POA: Diagnosis not present

## 2018-12-15 DIAGNOSIS — R55 Syncope and collapse: Secondary | ICD-10-CM | POA: Diagnosis not present

## 2018-12-15 DIAGNOSIS — H748X1 Other specified disorders of right middle ear and mastoid: Secondary | ICD-10-CM | POA: Diagnosis not present

## 2018-12-15 DIAGNOSIS — I5032 Chronic diastolic (congestive) heart failure: Secondary | ICD-10-CM | POA: Diagnosis not present

## 2018-12-15 DIAGNOSIS — I351 Nonrheumatic aortic (valve) insufficiency: Secondary | ICD-10-CM | POA: Diagnosis not present

## 2018-12-15 DIAGNOSIS — Z79891 Long term (current) use of opiate analgesic: Secondary | ICD-10-CM | POA: Diagnosis not present

## 2018-12-15 DIAGNOSIS — H353190 Nonexudative age-related macular degeneration, unspecified eye, stage unspecified: Secondary | ICD-10-CM | POA: Diagnosis not present

## 2018-12-15 DIAGNOSIS — S42021D Displaced fracture of shaft of right clavicle, subsequent encounter for fracture with routine healing: Secondary | ICD-10-CM | POA: Diagnosis not present

## 2018-12-15 DIAGNOSIS — D352 Benign neoplasm of pituitary gland: Secondary | ICD-10-CM | POA: Diagnosis not present

## 2018-12-15 DIAGNOSIS — S93401D Sprain of unspecified ligament of right ankle, subsequent encounter: Secondary | ICD-10-CM | POA: Diagnosis not present

## 2018-12-15 DIAGNOSIS — F419 Anxiety disorder, unspecified: Secondary | ICD-10-CM | POA: Diagnosis not present

## 2018-12-15 DIAGNOSIS — E785 Hyperlipidemia, unspecified: Secondary | ICD-10-CM | POA: Diagnosis not present

## 2018-12-15 DIAGNOSIS — I429 Cardiomyopathy, unspecified: Secondary | ICD-10-CM | POA: Diagnosis not present

## 2018-12-15 DIAGNOSIS — H43813 Vitreous degeneration, bilateral: Secondary | ICD-10-CM | POA: Diagnosis not present

## 2018-12-15 DIAGNOSIS — I447 Left bundle-branch block, unspecified: Secondary | ICD-10-CM | POA: Diagnosis not present

## 2018-12-21 ENCOUNTER — Other Ambulatory Visit: Payer: Self-pay | Admitting: Cardiovascular Disease

## 2019-01-06 DIAGNOSIS — S42001A Fracture of unspecified part of right clavicle, initial encounter for closed fracture: Secondary | ICD-10-CM | POA: Diagnosis not present

## 2019-02-09 ENCOUNTER — Other Ambulatory Visit: Payer: Self-pay | Admitting: Cardiovascular Disease

## 2019-02-13 ENCOUNTER — Other Ambulatory Visit: Payer: Self-pay | Admitting: Cardiovascular Disease

## 2019-03-13 ENCOUNTER — Other Ambulatory Visit: Payer: Self-pay | Admitting: Cardiovascular Disease

## 2019-03-24 ENCOUNTER — Other Ambulatory Visit (HOSPITAL_COMMUNITY): Payer: Self-pay | Admitting: *Deleted

## 2019-03-27 ENCOUNTER — Other Ambulatory Visit: Payer: Self-pay

## 2019-03-27 ENCOUNTER — Ambulatory Visit (HOSPITAL_COMMUNITY)
Admission: RE | Admit: 2019-03-27 | Discharge: 2019-03-27 | Disposition: A | Discharge status: Home / Self Care | Payer: Medicare Other | Source: Ambulatory Visit | Attending: Internal Medicine | Admitting: Internal Medicine

## 2019-03-27 DIAGNOSIS — M810 Age-related osteoporosis without current pathological fracture: Secondary | ICD-10-CM | POA: Diagnosis not present

## 2019-03-27 MED ORDER — DENOSUMAB 60 MG/ML ~~LOC~~ SOSY
60.0000 mg | PREFILLED_SYRINGE | Freq: Once | SUBCUTANEOUS | Status: AC
  Administered 2019-03-27: 14:00:00 60 mg via SUBCUTANEOUS

## 2019-03-27 MED ORDER — DENOSUMAB 60 MG/ML ~~LOC~~ SOSY
PREFILLED_SYRINGE | SUBCUTANEOUS | Status: AC
  Administered 2019-03-27: 14:00:00 60 mg via SUBCUTANEOUS
  Filled 2019-03-27: qty 1

## 2019-04-06 DIAGNOSIS — Z1231 Encounter for screening mammogram for malignant neoplasm of breast: Secondary | ICD-10-CM | POA: Diagnosis not present

## 2019-04-13 DIAGNOSIS — Z0120 Encounter for dental examination and cleaning without abnormal findings: Secondary | ICD-10-CM | POA: Diagnosis not present

## 2019-06-02 ENCOUNTER — Ambulatory Visit: Payer: Medicare Other

## 2019-06-06 DIAGNOSIS — Z961 Presence of intraocular lens: Secondary | ICD-10-CM | POA: Diagnosis not present

## 2019-06-06 DIAGNOSIS — H43813 Vitreous degeneration, bilateral: Secondary | ICD-10-CM | POA: Diagnosis not present

## 2019-06-06 DIAGNOSIS — H353131 Nonexudative age-related macular degeneration, bilateral, early dry stage: Secondary | ICD-10-CM | POA: Diagnosis not present

## 2019-06-10 ENCOUNTER — Ambulatory Visit: Payer: Medicare Other | Attending: Internal Medicine

## 2019-06-10 DIAGNOSIS — Z23 Encounter for immunization: Secondary | ICD-10-CM | POA: Insufficient documentation

## 2019-06-10 NOTE — Progress Notes (Signed)
   Covid-19 Vaccination Clinic  Name:  Susan [name]    MRN: [id number] DOB: 1934-05-03  06/10/2019  [name] was observed post Covid-19 immunization for 15 minutes without incidence. She was provided with Vaccine Information Sheet and instruction to access the V-Safe system.   [name] was instructed to call 911 with any severe reactions post vaccine: Marland Kitchen Difficulty breathing  . Swelling of your face and throat  . A fast heartbeat  . A bad rash all over your body  . Dizziness and weakness    Immunizations Administered    Name Date Dose VIS Date Route   Pfizer COVID-19 Vaccine 06/10/2019  5:33 PM 0.3 mL 04/14/2019 Intramuscular   Manufacturer: [place]   Lot: [id number]   [place]: [id number]

## 2019-06-13 ENCOUNTER — Ambulatory Visit: Payer: Medicare Other

## 2019-06-15 ENCOUNTER — Encounter: Payer: Self-pay | Admitting: Cardiovascular Disease

## 2019-06-15 ENCOUNTER — Other Ambulatory Visit: Payer: Self-pay

## 2019-06-15 ENCOUNTER — Ambulatory Visit: Payer: Medicare Other | Admitting: Cardiovascular Disease

## 2019-06-15 VITALS — BP 110/76 | HR 78 | Ht 63.5 in | Wt 136.1 lb

## 2019-06-15 DIAGNOSIS — I447 Left bundle-branch block, unspecified: Secondary | ICD-10-CM | POA: Diagnosis not present

## 2019-06-15 DIAGNOSIS — I5022 Chronic systolic (congestive) heart failure: Secondary | ICD-10-CM

## 2019-06-15 DIAGNOSIS — R55 Syncope and collapse: Secondary | ICD-10-CM

## 2019-06-15 DIAGNOSIS — I2510 Atherosclerotic heart disease of native coronary artery without angina pectoris: Secondary | ICD-10-CM | POA: Diagnosis not present

## 2019-06-15 DIAGNOSIS — I6523 Occlusion and stenosis of bilateral carotid arteries: Secondary | ICD-10-CM

## 2019-06-15 DIAGNOSIS — E785 Hyperlipidemia, unspecified: Secondary | ICD-10-CM | POA: Diagnosis not present

## 2019-06-15 NOTE — Progress Notes (Signed)
Cardiology Office Note   Date:  06/16/2019   ID:  Susan [name], Susan [name] November 29, 1933, MRN [id number]  PCP:  Crist Infante, MD  Cardiologist:   Mertie Moores, MD   No chief complaint on file.  1. Coronary artery disease-status post PTCA and stenting of her right coronary artery. We placed a 2.75 x 16 mm Taxus stent deployed at 18 atmospheres. She also has a moderate proximal LAD stenosis. 2. Hypothyroidism 3. Hyperlipidemia-   Previous Notes:    Susan [name] is a 86 yo with a hx of CAD. She is still playing tennis regularly. She has not had any angina. She has some DOE.   October 26, 2012:  Susan [name] is doing well. She is still playing lots of tennis. No CP. She has had a lifeline screening - her carotid arteries are normal. She denies episodes of chest pain or shortness of breath.  October 11, 2013:  Susan [name] is not feeling well today. "Feels like she is jumping out of her skin" Got back from a trip , she found out that a friend had died.   January 14, 2014:  Susan [name] is doing ok. She has had some eye problems. Her eyesite is ok.  No angina. She does have some mild dyspnea if she climbs 3-4 flights of stairs. Still playing tennis regularly.    July 18, 2014:  Susan [name] is a 86 y.o. female who presents for follow up for her CAD. Just got back from a mission trip in [place]. Did lots of painting and cleaning. No angina or dyspnea.   Is not playing as much tennis - her tennis team has moved on.  2 of her partners have died , 1 is too unstable on her feet.   July 25, 2015:  Doing well.  Very active in her garden .  Plays tennis regularly . Plays on soft courts because of her back and knee pain .  July 28, 2016:  Susan [name] is seen today  Recent labs at [facility] reveals Chol = 155 Trigs = 113 HDL = 59 LDL = 73  She is upset today .   Her grandson OD'd on cough syrup He is doing to a rehab place today   Saw [name], NP on March 5 for  palpitations.  Walking some.   Looking forward to getting back into tennis . No CP,  Palpitations have resolved.   Nov. 8, 2018  Doing well Still playing tennis  Plays on soft courts  No CP or dyspnea  Has occasional episodes of near syncope .  Most of the time when she is sitting down.   Last for a second or so.   Feels fine right after the episode  Has had 2 episodes over the past 6 months .   Did not pass out  Sep 22, 2017:  Doing well.   Not playing as much tennis now - most of her tennis friends have died. .    Has occasional DOE while playing tennis for a long point or walking up a hill.  No real chest pain   October 28, 2017:  Susan [name] is seen back today for follow-up visit.  She has a history of coronary artery disease and coronary stenting.  She was having some episodes of chest pain when I last saw her in May.  Repeat heart catheterization revealed a moderate to severe mid LAD stenosis of 60 to 70%.  This is slightly worse than it was in her  heart catheterization in 2012.  She was found to have severely reduced left ventricular systolic function.  She was started on carvedilol 3.125 mg twice a day.  Feb. 11, 2021:  Susan [name] is seen today for a follow-up visit.  She has a history of chronic systolic congestive heart failure.  Her ejection fraction was 30 to 35%.  Echocardiogram performed in July, 2020 revealed a slightly improved LV function of 40 to 45%.  She does have some dyssynergy due to her left bundle branch block.  She is admitted to [facility] in July, 2020 with near syncope and symptoms consistent with orthostatic hypotension.  She had had little to eat that day and was rushing around all day.  She fell and fractured her clavicle.  She has had a few other episodes of orthostasis .  She has warning of a few seconds.  She cut her Coreg to 3. 125 a day due to orthostasis .  She cannot tel if her symptoms are better but she hasnt fallen.    Past Medical History:   Diagnosis Date  . Coronary artery disease    POST PTCA AND STENTING OF HER RIGHT CORONARY ARTERY  . Hypothyroidism   . LAD stenosis    MODERATE 50-60% STENOSIS  . Osteoporosis     Past Surgical History:  Procedure Laterality Date  . APPENDECTOMY  1970s?   "ruptured"   . BACK SURGERY    . CATARACT EXTRACTION W/ INTRAOCULAR LENS  IMPLANT, BILATERAL Bilateral   . CORONARY ANGIOPLASTY WITH STENT PLACEMENT  ~ 2009   "2 stents"  . FRACTURE SURGERY    . IR VERTEBROPLASTY CERV/THOR BX INC UNI/BIL INC/INJECT/IMAGING  02/07/2018  . KNEE ARTHROSCOPY Right     torn meniscus  . LUMBAR LAMINECTOMY  1978  . OVARIAN CYST SURGERY  1970s?   "ruptured"   . RIGHT/LEFT HEART CATH AND CORONARY ANGIOGRAPHY N/A 10/08/2017   Procedure: RIGHT/LEFT HEART CATH AND CORONARY ANGIOGRAPHY;  Surgeon: Nelva Bush, MD;  Location: [facility];  Service: Cardiovascular;  Laterality: N/A;  . WRIST FRACTURE SURGERY Bilateral    "2 surgeries for 2 breaks on left side; 1 surgery for 1 break on right wrist"     Current Outpatient Medications  Medication Sig Dispense Refill  . acetaminophen (TYLENOL) 325 MG tablet Take 2 tablets (650 mg total) by mouth every 6 (six) hours.    . Calcium Carbonate-Vitamin D (CALCIUM + D PO) Take 2 tablets by mouth at bedtime.     . carvedilol (COREG) 3.125 MG tablet Take 3.125 mg by mouth daily.    . cholecalciferol (VITAMIN D3) 25 MCG (1000 UT) tablet Take 1,000 Units by mouth daily after breakfast.    . clopidogrel (PLAVIX) 75 MG tablet Take 1 tablet (75 mg total) by mouth daily. 90 tablet 1  . levothyroxine (SYNTHROID, LEVOTHROID) 75 MCG tablet Take 75 mcg by mouth daily before breakfast.   0  . multivitamin-lutein (OCUVITE-LUTEIN) CAPS capsule Take 1 capsule by mouth 2 (two) times a day.     . nitroGLYCERIN (NITROSTAT) 0.4 MG SL tablet Place 1 tablet (0.4 mg total) under the tongue every 5 (five) minutes as needed. 25 tablet 3  . Omega-3 Fatty Acids (FISH OIL) 1200 MG  CAPS Take 1,200 mg by mouth at bedtime.    . rosuvastatin (CRESTOR) 40 MG tablet Take 1 tablet (40 mg total) by mouth daily. 90 tablet 3  . venlafaxine XR (EFFEXOR-XR) 150 MG 24 hr capsule Take 1 capsule by  mouth once daily 90 capsule 3  . vitamin B-12 (CYANOCOBALAMIN) 1000 MCG tablet Take 2,000 mcg by mouth daily.      No current facility-administered medications for this visit.    Allergies:   Patient has no known allergies.    Social History:  The patient  reports that she has never smoked. She has never used smokeless tobacco. She reports current alcohol use of about 1.0 standard drinks of alcohol per week. She reports that she does not use drugs.   Family History:  The patient's family history includes Alzheimer's disease in her mother; Aneurysm in her sister; Heart attack in her father.    ROS:    Noted in current history, otherwise review of systems is negative.  Physical Exam: Blood pressure 110/76, pulse 78, height 5' 3.5" (1.613 m), weight 136 lb 1.9 oz (61.7 kg), SpO2 97 %.  GEN:    Elderly female,  NAD  HEENT: Normal NECK: No JVD; No carotid bruits LYMPHATICS: No lymphadenopathy CARDIAC: RRR , no murmurs, rubs, gallops RESPIRATORY:  Clear to auscultation without rales, wheezing or rhonchi  ABDOMEN: Soft, non-tender, non-distended MUSCULOSKELETAL:  No edema; No deformity  SKIN: Warm and dry NEUROLOGIC:  Alert and oriented x 3    EKG:      Recent Labs: 11/12/2018: B Natriuretic Peptide 91.0 11/13/2018: ALT 20; BUN 12; Creatinine, Ser 0.68; Hemoglobin 12.0; Platelets 193; Potassium 3.7; Sodium 140    Lipid Panel    Component Value Date/Time   CHOL 136 11/13/2018 0857   TRIG 148 11/13/2018 0857   HDL 49 11/13/2018 0857   CHOLHDL 2.8 11/13/2018 0857   VLDL 30 11/13/2018 0857   LDLCALC 57 11/13/2018 0857      Wt Readings from Last 3 Encounters:  06/15/19 136 lb 1.9 oz (61.7 kg)  12/12/18 134 lb 1.9 oz (60.8 kg)  11/14/18 134 lb 0.6 oz (60.8 kg)       Other studies Reviewed: Additional studies/ records that were reviewed today include: . Review of the above records demonstrates:    ASSESSMENT AND PLAN:  1. Coronary artery disease-status post PTCA and stenting of her right coronary artery in 2005 . We placed a 2.75 x 16 mm Taxus stent deployed at 18 atmospheres. She also has a moderate proximal LAD stenosis. No angina ,  She was able  .  2.  Chronic systolic CHF:   - EF [id number].   Has a LBBB  On Coreg 3. 125 QD .   She had lots of orthostasis with the 3.125 BID She is feeling much better .   3.  Carotid artery disease:  Will repeat carotid duplex scan for next year    3. Hyperlipidemia-  Labs look great.   Managed by Dr. Joylene Draft    Current medicines are reviewed at length with the patient today.  The patient does not have concerns regarding medicines.  The following changes have been made:  no change  Labs/ tests ordered today include:   Orders Placed This Encounter  Procedures  . EKG 12-Lead  . VAS US CAROTID  - Carotid duplex scan      Signed, Mertie Moores, MD  06/16/2019 10:55 AM    [facility] [address], [place], [place]  [address] Phone: [phone]; Fax: [phone]

## 2019-06-15 NOTE — Patient Instructions (Signed)
Medication Instructions:  Your physician recommends that you continue on your current medications as directed. Please refer to the Current Medication list given to you today.  *If you need a refill on your cardiac medications before your next appointment, please call your pharmacy*  Lab Work: None Ordered If you have labs (blood work) drawn today and your tests are completely normal, you will receive your results only by: Marland Kitchen MyChart Message (if you have MyChart) OR . A paper copy in the mail If you have any lab test that is abnormal or we need to change your treatment, we will call you to review the results.   Testing/Procedures: Your physician has requested that you have a carotid duplex on June 06, 2020. This test is an ultrasound of the carotid arteries in your neck. It looks at blood flow through these arteries that supply the brain with blood. Allow one hour for this exam. There are no restrictions or special instructions.     Follow-Up: At [facility], you and your health needs are our priority.  As part of our continuing mission to provide you with exceptional heart care, we have created designated Provider Care Teams.  These Care Teams include your primary Cardiologist (physician) and Advanced Practice Providers (APPs -  Physician Assistants and Nurse Practitioners) who all work together to provide you with the care you need, when you need it.  Your next appointment:   1 year(s)  The format for your next appointment:   In Person  Provider:   You may see Mertie Moores, MD or one of the following Advanced Practice Providers on your designated Care Team:    Richardson Dopp, PA-C  [place], [place]  Daune Perch, [place]

## 2019-07-05 ENCOUNTER — Ambulatory Visit: Payer: Medicare Other | Attending: Internal Medicine

## 2019-07-05 DIAGNOSIS — Z23 Encounter for immunization: Secondary | ICD-10-CM | POA: Insufficient documentation

## 2019-07-05 NOTE — Progress Notes (Signed)
   Covid-19 Vaccination Clinic  Name:  Susan [name]    MRN: [id number] DOB: June 19, 1933  07/05/2019  Ms. Agent was observed post Covid-19 immunization for 15 minutes without incident. She was provided with Vaccine Information Sheet and instruction to access the V-Safe system.   [name] was instructed to call 911 with any severe reactions post vaccine: Marland Kitchen Difficulty breathing  . Swelling of face and throat  . A fast heartbeat  . A bad rash all over body  . Dizziness and weakness   Immunizations Administered    Name Date Dose VIS Date Route   Pfizer COVID-19 Vaccine 07/05/2019  2:50 PM 0.3 mL 04/14/2019 Intramuscular   Manufacturer: [place]   Lot: [id number]   [place]: [id number]

## 2019-08-02 DIAGNOSIS — E038 Other specified hypothyroidism: Secondary | ICD-10-CM | POA: Diagnosis not present

## 2019-08-02 DIAGNOSIS — E7849 Other hyperlipidemia: Secondary | ICD-10-CM | POA: Diagnosis not present

## 2019-08-02 DIAGNOSIS — Z0000 Encounter for general adult medical examination without abnormal findings: Secondary | ICD-10-CM | POA: Diagnosis not present

## 2019-08-02 DIAGNOSIS — E538 Deficiency of other specified B group vitamins: Secondary | ICD-10-CM | POA: Diagnosis not present

## 2019-08-02 DIAGNOSIS — M810 Age-related osteoporosis without current pathological fracture: Secondary | ICD-10-CM | POA: Diagnosis not present

## 2019-08-11 ENCOUNTER — Telehealth: Payer: Self-pay

## 2019-08-11 DIAGNOSIS — K5900 Constipation, unspecified: Secondary | ICD-10-CM | POA: Diagnosis not present

## 2019-08-11 DIAGNOSIS — Z0000 Encounter for general adult medical examination without abnormal findings: Secondary | ICD-10-CM | POA: Diagnosis not present

## 2019-08-11 DIAGNOSIS — R82998 Other abnormal findings in urine: Secondary | ICD-10-CM | POA: Diagnosis not present

## 2019-08-11 DIAGNOSIS — I2510 Atherosclerotic heart disease of native coronary artery without angina pectoris: Secondary | ICD-10-CM | POA: Diagnosis not present

## 2019-08-11 DIAGNOSIS — M810 Age-related osteoporosis without current pathological fracture: Secondary | ICD-10-CM | POA: Diagnosis not present

## 2019-08-11 NOTE — Telephone Encounter (Signed)
NOTES ON FILE 

## 2019-08-31 DIAGNOSIS — Z85828 Personal history of other malignant neoplasm of skin: Secondary | ICD-10-CM | POA: Diagnosis not present

## 2019-08-31 DIAGNOSIS — C44722 Squamous cell carcinoma of skin of right lower limb, including hip: Secondary | ICD-10-CM | POA: Diagnosis not present

## 2019-08-31 DIAGNOSIS — L570 Actinic keratosis: Secondary | ICD-10-CM | POA: Diagnosis not present

## 2019-09-11 ENCOUNTER — Ambulatory Visit (HOSPITAL_COMMUNITY)
Admission: RE | Admit: 2019-09-11 | Discharge: 2019-09-11 | Disposition: A | Discharge status: Home / Self Care | Payer: Medicare Other | Source: Ambulatory Visit | Attending: Internal Medicine | Admitting: Internal Medicine

## 2019-09-11 ENCOUNTER — Other Ambulatory Visit: Payer: Self-pay

## 2019-09-11 DIAGNOSIS — M810 Age-related osteoporosis without current pathological fracture: Secondary | ICD-10-CM | POA: Insufficient documentation

## 2019-09-11 MED ORDER — DENOSUMAB 60 MG/ML ~~LOC~~ SOSY
PREFILLED_SYRINGE | SUBCUTANEOUS | Status: AC
  Administered 2019-09-11: 60 mg via SUBCUTANEOUS
  Filled 2019-09-11: qty 1

## 2019-09-11 MED ORDER — DENOSUMAB 60 MG/ML ~~LOC~~ SOSY: 60.0000 mg | PREFILLED_SYRINGE | Freq: Once | SUBCUTANEOUS | Status: AC

## 2019-10-07 ENCOUNTER — Other Ambulatory Visit: Payer: Self-pay | Admitting: Cardiovascular Disease

## 2019-11-07 DIAGNOSIS — Z0120 Encounter for dental examination and cleaning without abnormal findings: Secondary | ICD-10-CM | POA: Diagnosis not present

## 2019-12-07 DIAGNOSIS — E039 Hypothyroidism, unspecified: Secondary | ICD-10-CM | POA: Diagnosis not present

## 2019-12-07 DIAGNOSIS — E038 Other specified hypothyroidism: Secondary | ICD-10-CM | POA: Diagnosis not present

## 2019-12-15 IMAGING — DX DG KNEE COMPLETE 4+V*R*
1 series · 4 of 4 positions shown · non-contrast
Comparison: None.

CLINICAL DATA: Trip and fall into table with bilateral knee pain.

EXAM:
RIGHT KNEE - COMPLETE 4+ VIEW

[Series 1: knee · 0.14mm/px · 4 of 4 slices shown]
[im 1/4]
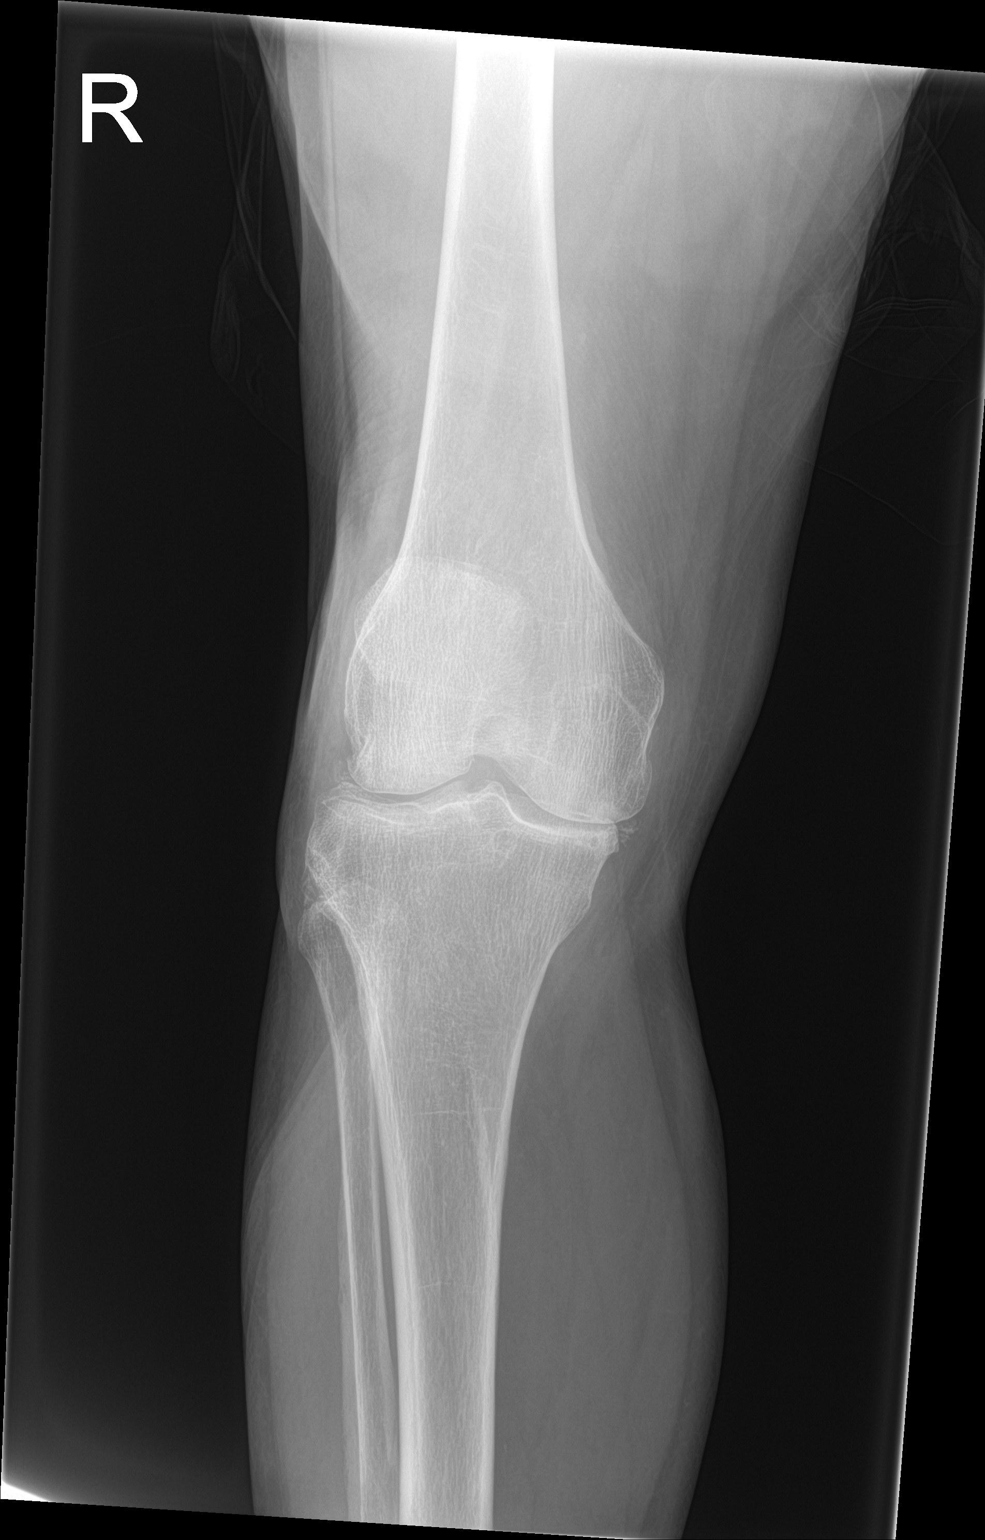
[im 2/4]
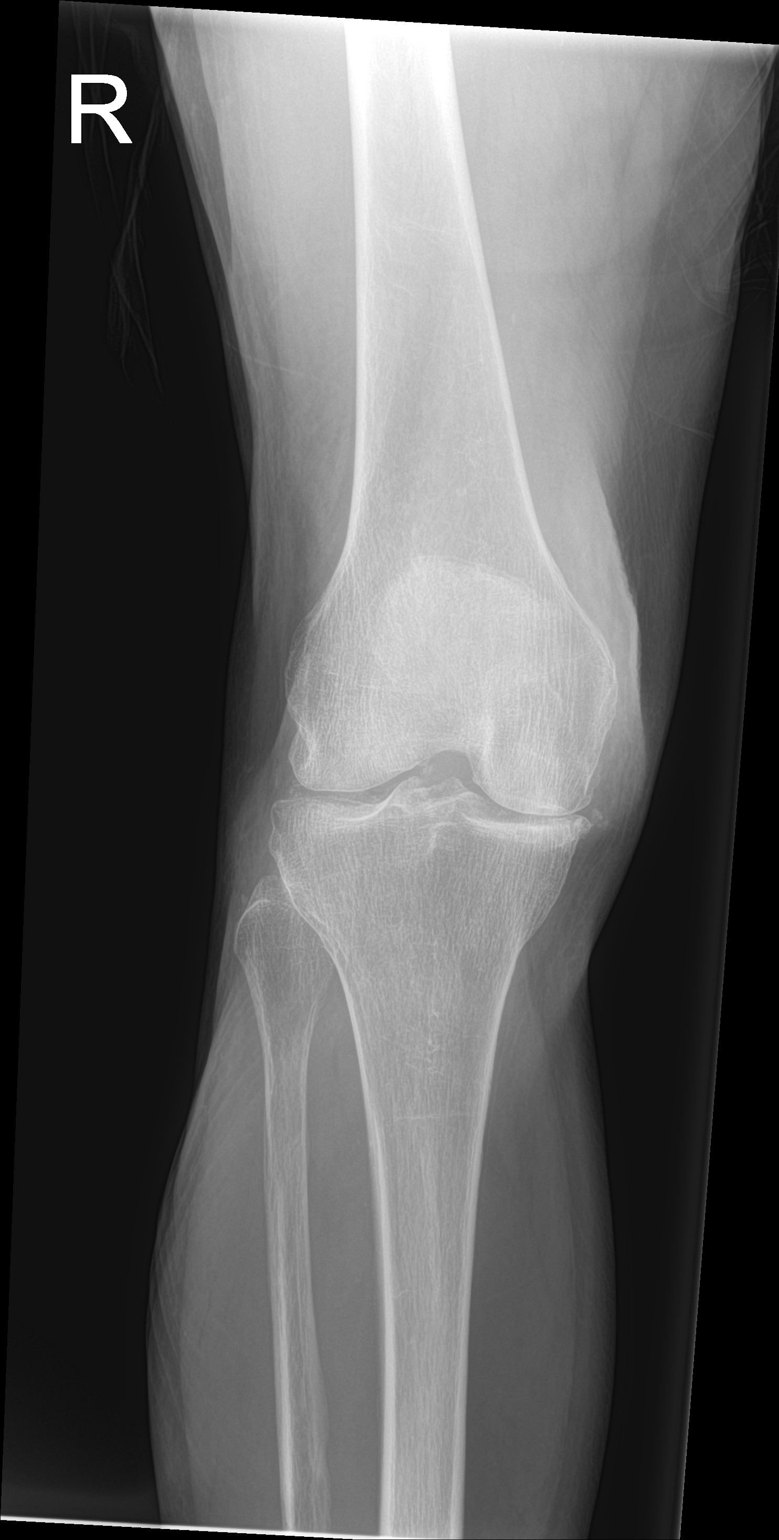
[im 3/4]
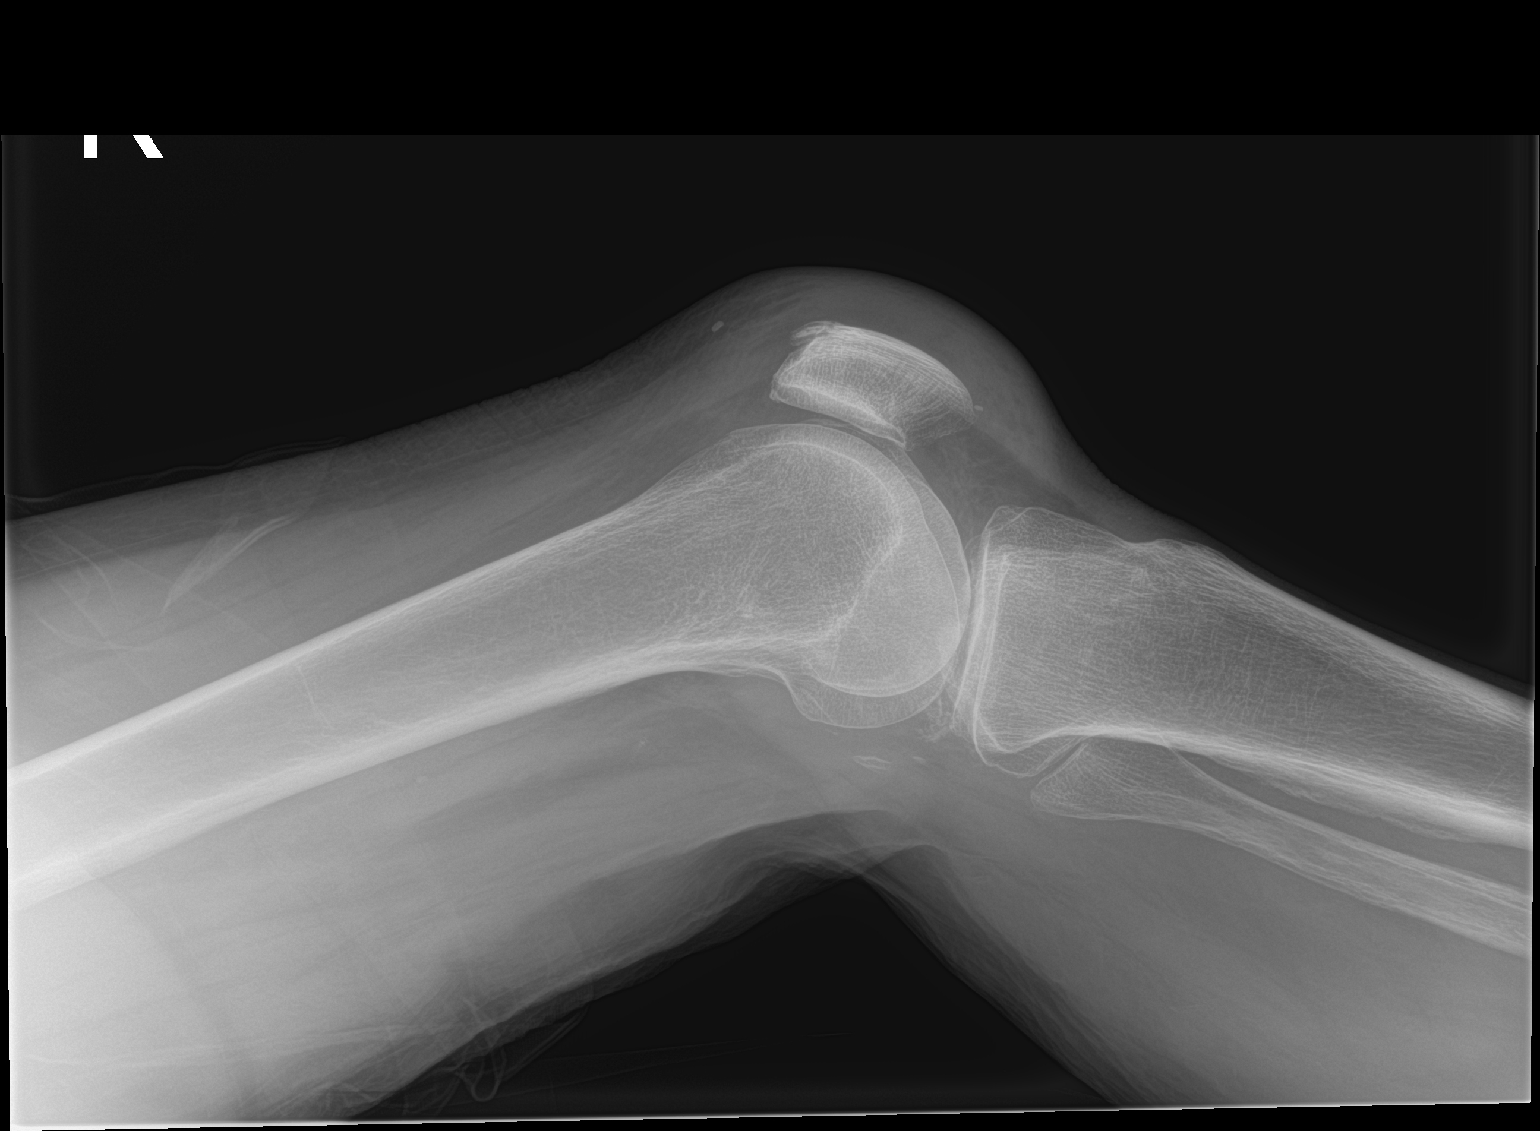
[im 4/4]
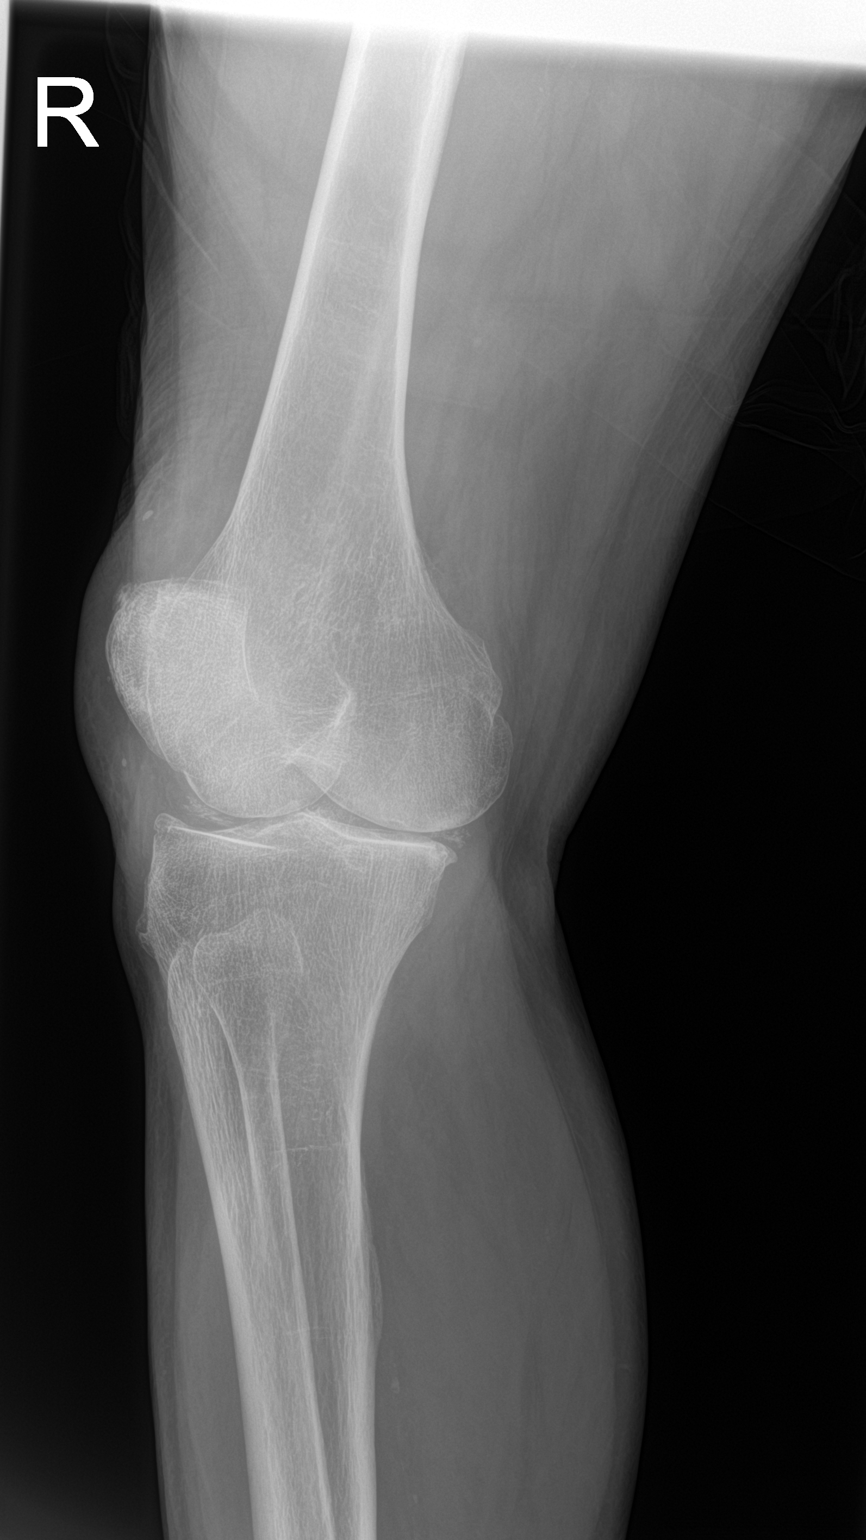

[4 of 4 positions shown; findings below may reference images not displayed]

FINDINGS: No fracture or dislocation. No significant joint effusion. There is
prepatellar soft tissue edema. Medial and lateral tibiofemoral
chondrocalcinosis. Medial joint space narrowing with peripheral
spurring. Small quadriceps tendon enthesophyte.
IMPRESSION: 1. Prepatellar soft tissue edema.  No fracture or dislocation.
2. Mild osteoarthritis and chondrocalcinosis.

## 2019-12-15 IMAGING — CT CT CHEST W/ CM
3 of 5 series · 15 of 36 positions shown, 18 images · IV contrast (APPLIED)
Comparison: Chest x-ray 01/31/2018

CLINICAL DATA: Trauma

EXAM:
CT CHEST, ABDOMEN, AND PELVIS WITH CONTRAST
TECHNIQUE: Multidetector CT imaging of the chest, abdomen and pelvis was
performed following the standard protocol during bolus
administration of intravenous contrast.
CONTRAST:  100mL OHD4JD-SIA IOPAMIDOL (OHD4JD-SIA) INJECTION 76%

[Series 3: cap with · axial · 0.78mm/px · z∈[-740,-275]mm · 9 of 117 slices shown, 12 images]
[im 12/117  mediastinal]
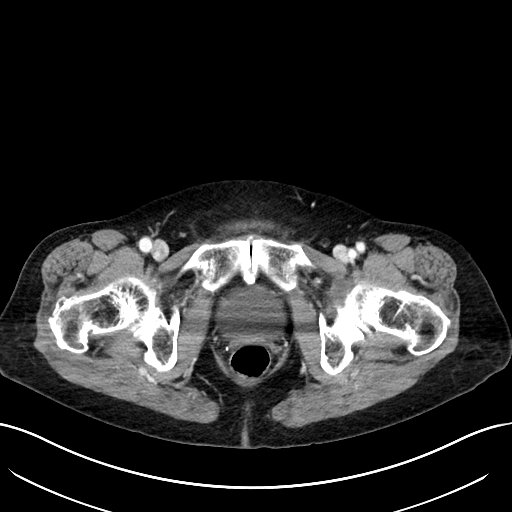
[im 12/117  lung]
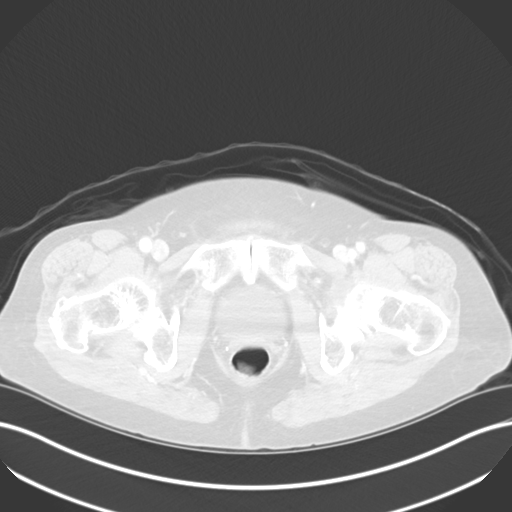
[im 24/117  lung]
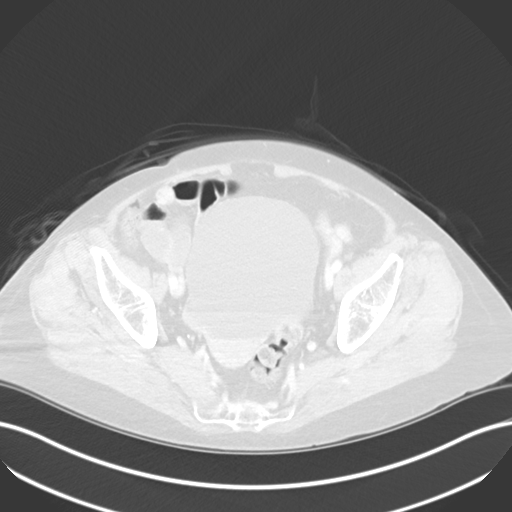
[im 35/117  lung]
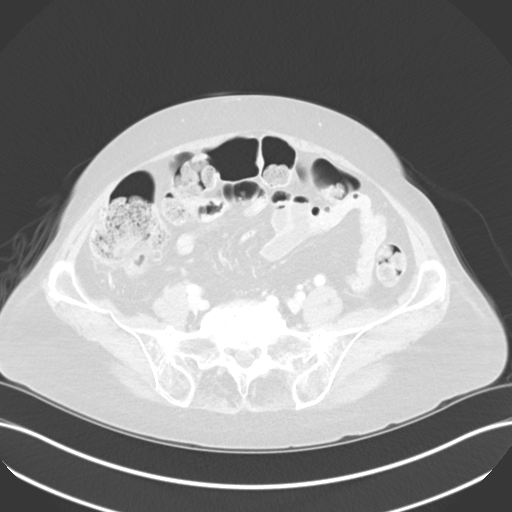
[im 47/117  lung]
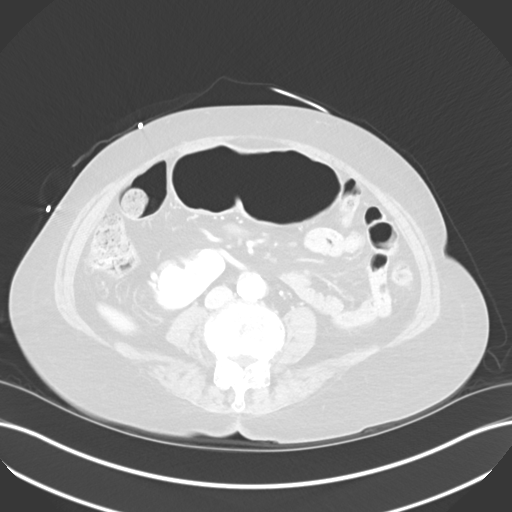
[im 59/117  mediastinal]
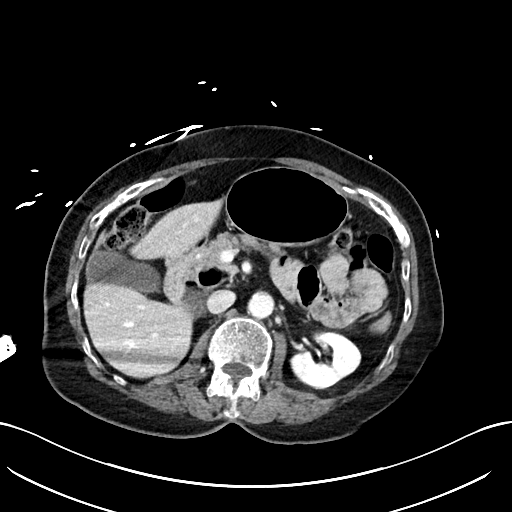
[im 59/117  lung]
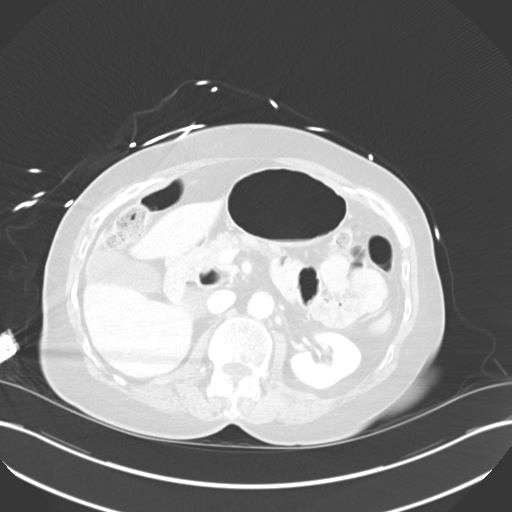
[im 70/117  lung]
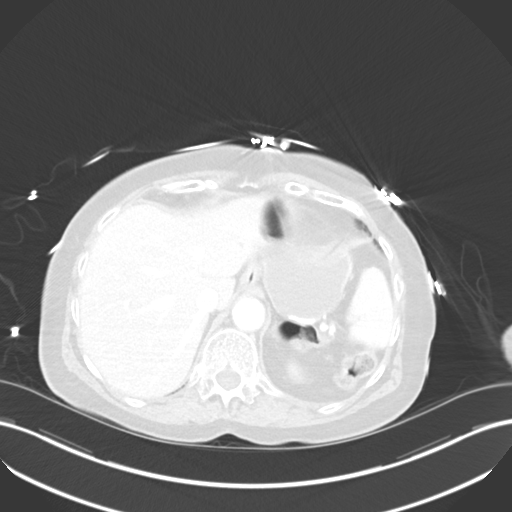
[im 82/117  lung]
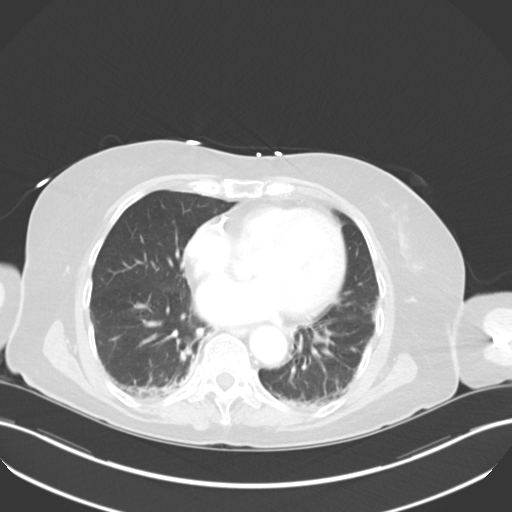
[im 93/117  lung]
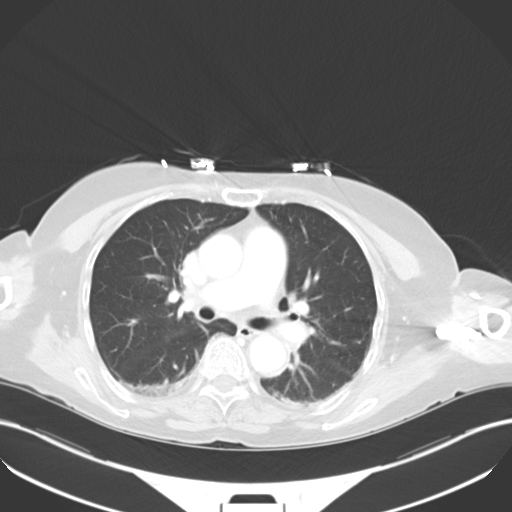
[im 105/117  mediastinal]
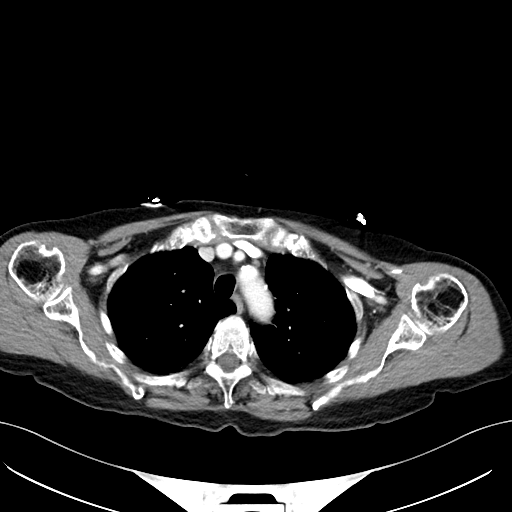
[im 105/117  lung]
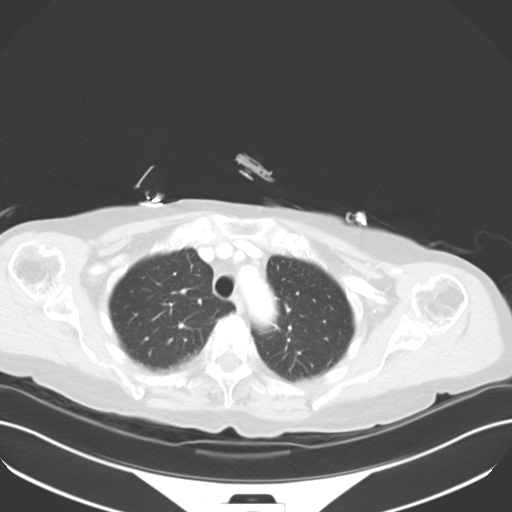

[Series 5: lungs · axial · 0.78mm/px · z∈[-463,-395]mm · 3 of 136 slices shown]
[im 12/136  lung]
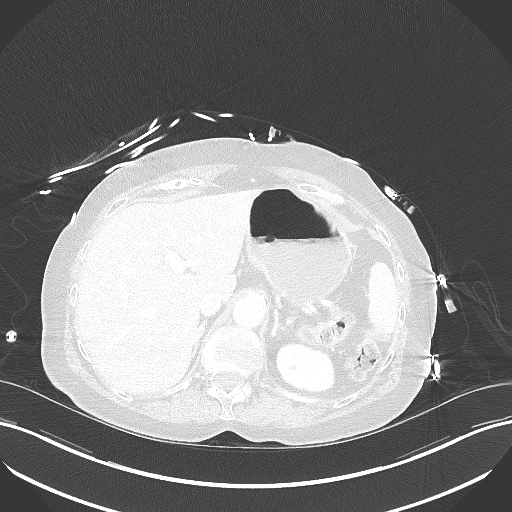
[im 34/136  lung]
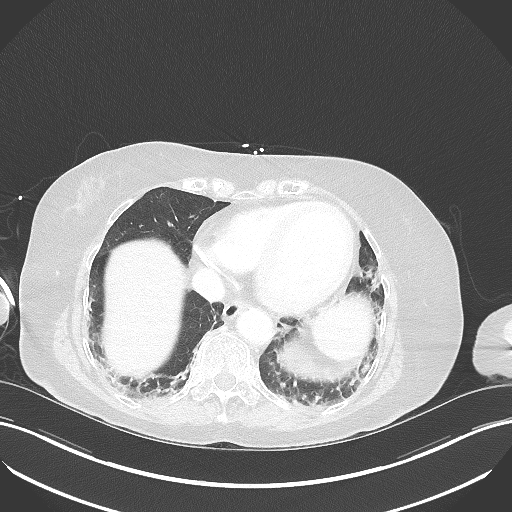
[im 46/136  lung]
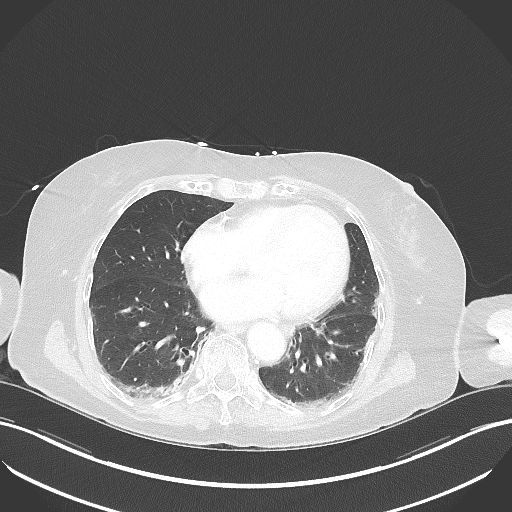

[Series 6: cor · coronal · 0.88mm/px · 3 of 101 slices shown]
[im 21/101  lung]
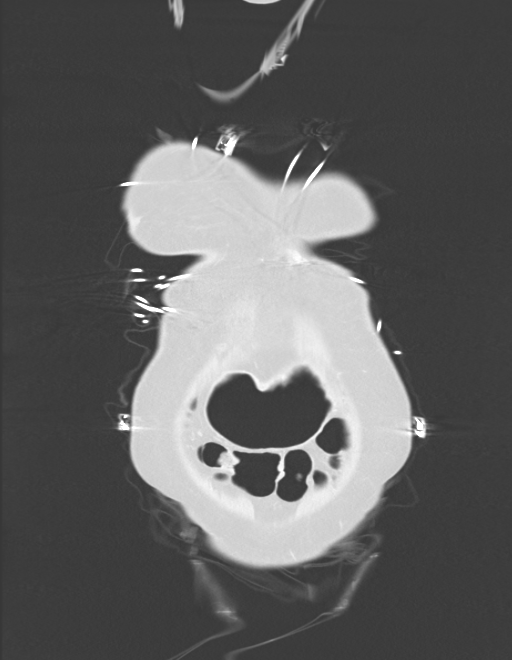
[im 41/101  lung]
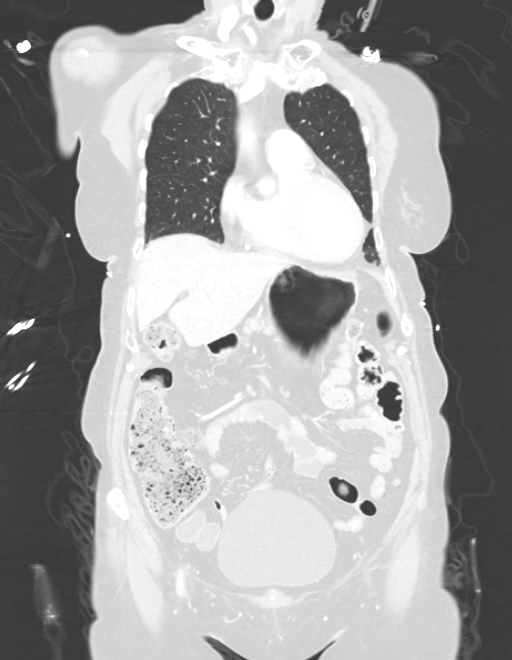
[im 61/101  lung]
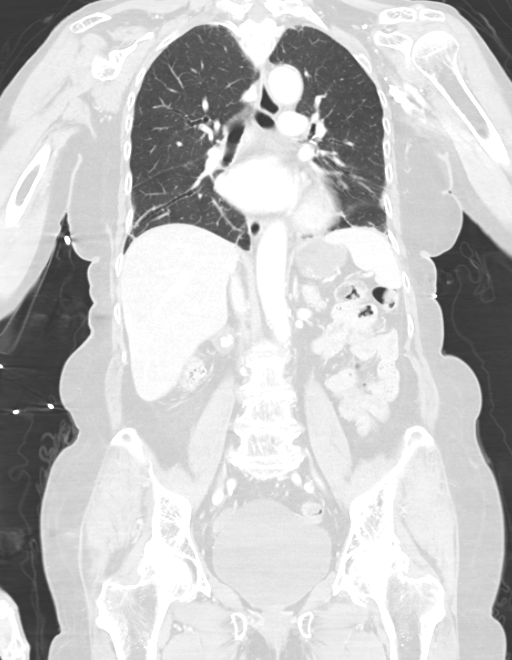

[15 of 36 positions shown; findings below may reference images not displayed]

FINDINGS: CT CHEST FINDINGS

Cardiovascular: Nonaneurysmal aorta. Mild aortic atherosclerosis.
Coronary vascular calcification. Normal heart size. No pericardial
effusion.

Mediastinum/Nodes: No enlarged mediastinal, hilar, or axillary lymph
nodes. Thyroid gland, trachea, and esophagus demonstrate no
significant findings.

Lungs/Pleura: Mild blebs at the lung bases. No acute consolidation,
pleural effusion or pneumothorax.

Musculoskeletal: Possible acute to subacute compression fracture T6
with about 25% loss of height of the anterior vertebral body.
Sternum is intact.

CT ABDOMEN PELVIS FINDINGS

Hepatobiliary: Cyst in the left hepatic lobe. No calcified gallstone
or biliary dilatation

Pancreas: Unremarkable. No pancreatic ductal dilatation or
surrounding inflammatory changes.

Spleen: No splenic injury or perisplenic hematoma.

Adrenals/Urinary Tract: Adrenal glands are within normal limits. No
hydronephrosis. Ectopic right pelvic kidney which is malrotated.
Prominent right extrarenal pelvis. Dilated urinary bladder

Stomach/Bowel: Stomach is within normal limits. No evidence of bowel
wall thickening, distention, or inflammatory changes.

Vascular/Lymphatic: Nonaneurysmal aorta. Moderate aortic
atherosclerosis. No significantly enlarged lymph nodes.

Reproductive: Uterus and bilateral adnexa are unremarkable.

Other: Negative for free air or free fluid.

Musculoskeletal: No acute or significant osseous findings.
Degenerative changes
IMPRESSION: 1. No CT evidence for acute intrathoracic, intra-abdominal or
intrapelvic abnormality.
2. Possible acute to subacute compression fracture T6 with about 25%
loss of height of the anterior vertebral body
3. Ectopic right pelvic kidney

## 2020-01-19 ENCOUNTER — Other Ambulatory Visit: Payer: Self-pay | Admitting: Cardiovascular Disease

## 2020-01-24 DIAGNOSIS — Z0120 Encounter for dental examination and cleaning without abnormal findings: Secondary | ICD-10-CM | POA: Diagnosis not present

## 2020-01-28 ENCOUNTER — Other Ambulatory Visit: Payer: Self-pay | Admitting: Cardiovascular Disease

## 2020-02-12 ENCOUNTER — Other Ambulatory Visit: Payer: Self-pay | Admitting: Cardiovascular Disease

## 2020-02-13 NOTE — Telephone Encounter (Signed)
Looks like Dr. Acie Fredrickson has always prescribed medication and PCP draws labs.  Ok to fill.

## 2020-02-13 NOTE — Telephone Encounter (Signed)
3. Hyperlipidemia-  Labs look great.   Managed by Dr. Joylene Draft

## 2020-02-14 ENCOUNTER — Telehealth: Payer: Self-pay | Admitting: Cardiovascular Disease

## 2020-02-14 MED ORDER — ROSUVASTATIN CALCIUM 40 MG PO TABS: 40.0000 mg | ORAL_TABLET | Freq: Every day | ORAL | 1 refills | Status: DC

## 2020-02-14 NOTE — Telephone Encounter (Signed)
New Message:   Pt said her prescription for her Rosuvastatin was denied, because she need an appointment. She said Dr Acie Fredrickson wants to see her in February 2022 and that is what is listed in the [place].

## 2020-02-14 NOTE — Telephone Encounter (Signed)
Pt's medication was sent to pt's pharmacy as requested. Confirmation received.  °

## 2020-02-16 ENCOUNTER — Telehealth: Payer: Self-pay | Admitting: Cardiovascular Disease

## 2020-02-16 NOTE — Telephone Encounter (Signed)
Called pt to inform her that her medication was already sent to her pharmacy and if she has any other problems, questions or concerns, to give our office a call. Pt verbalized understanding.

## 2020-02-16 NOTE — Telephone Encounter (Signed)
*  STAT* If patient is at the pharmacy, call can be transferred to refill team.   1. Which medications need to be refilled? (please list name of each medication and dose if known) Rosuvastatin  2. Which pharmacy/location (including street and city if local pharmacy) is medication to be sent to? [facility]- [phone]  3. Do they need a 30 day or 90 day supply? 90 days and refills

## 2020-02-27 DIAGNOSIS — K219 Gastro-esophageal reflux disease without esophagitis: Secondary | ICD-10-CM | POA: Diagnosis not present

## 2020-02-27 DIAGNOSIS — M810 Age-related osteoporosis without current pathological fracture: Secondary | ICD-10-CM | POA: Diagnosis not present

## 2020-02-27 DIAGNOSIS — E039 Hypothyroidism, unspecified: Secondary | ICD-10-CM | POA: Diagnosis not present

## 2020-02-27 DIAGNOSIS — I2510 Atherosclerotic heart disease of native coronary artery without angina pectoris: Secondary | ICD-10-CM | POA: Diagnosis not present

## 2020-03-20 ENCOUNTER — Other Ambulatory Visit: Payer: Self-pay

## 2020-03-20 ENCOUNTER — Observation Stay (HOSPITAL_COMMUNITY)
Admission: EM | Admit: 2020-03-20 | Discharge: 2020-03-22 | Disposition: A | Discharge status: Home / Self Care | Payer: Medicare Other | Attending: Family Medicine | Admitting: Family Medicine

## 2020-03-20 ENCOUNTER — Emergency Department (HOSPITAL_COMMUNITY): Payer: Medicare Other

## 2020-03-20 ENCOUNTER — Encounter (HOSPITAL_COMMUNITY): Payer: Self-pay

## 2020-03-20 DIAGNOSIS — Z20822 Contact with and (suspected) exposure to covid-19: Secondary | ICD-10-CM | POA: Diagnosis not present

## 2020-03-20 DIAGNOSIS — Z79899 Other long term (current) drug therapy: Secondary | ICD-10-CM | POA: Diagnosis not present

## 2020-03-20 DIAGNOSIS — E039 Hypothyroidism, unspecified: Secondary | ICD-10-CM | POA: Insufficient documentation

## 2020-03-20 DIAGNOSIS — R072 Precordial pain: Secondary | ICD-10-CM | POA: Diagnosis present

## 2020-03-20 DIAGNOSIS — R079 Chest pain, unspecified: Secondary | ICD-10-CM | POA: Diagnosis not present

## 2020-03-20 DIAGNOSIS — Z7901 Long term (current) use of anticoagulants: Secondary | ICD-10-CM | POA: Diagnosis not present

## 2020-03-20 DIAGNOSIS — I517 Cardiomegaly: Secondary | ICD-10-CM | POA: Diagnosis not present

## 2020-03-20 DIAGNOSIS — I200 Unstable angina: Principal | ICD-10-CM | POA: Insufficient documentation

## 2020-03-20 DIAGNOSIS — R0602 Shortness of breath: Secondary | ICD-10-CM | POA: Diagnosis not present

## 2020-03-20 DIAGNOSIS — R0600 Dyspnea, unspecified: Secondary | ICD-10-CM | POA: Diagnosis not present

## 2020-03-20 NOTE — ED Triage Notes (Signed)
Pt states she wants a COVID test She received her booster shot on Monday morning and yesterday she started to feel some symptoms, she can't tell me exactly what but just says she knows her breathing isn't right and she can't get comfortable to sleep Pt states she doesn't want to be admitted

## 2020-03-20 NOTE — ED Notes (Signed)
Pt ambulated from lobby to triage room. Gait steady with no assistance

## 2020-03-21 ENCOUNTER — Encounter (HOSPITAL_COMMUNITY)
Admission: EM | Disposition: A | Discharge status: Home / Self Care | Payer: Self-pay | Source: Home / Self Care | Attending: Emergency Medicine

## 2020-03-21 ENCOUNTER — Emergency Department (HOSPITAL_COMMUNITY): Payer: Medicare Other

## 2020-03-21 ENCOUNTER — Encounter (HOSPITAL_COMMUNITY): Payer: Self-pay | Admitting: Emergency Medicine

## 2020-03-21 ENCOUNTER — Observation Stay (HOSPITAL_BASED_OUTPATIENT_CLINIC_OR_DEPARTMENT_OTHER): Payer: Medicare Other

## 2020-03-21 DIAGNOSIS — I447 Left bundle-branch block, unspecified: Secondary | ICD-10-CM | POA: Diagnosis not present

## 2020-03-21 DIAGNOSIS — Z20822 Contact with and (suspected) exposure to covid-19: Secondary | ICD-10-CM | POA: Diagnosis not present

## 2020-03-21 DIAGNOSIS — R071 Chest pain on breathing: Secondary | ICD-10-CM | POA: Diagnosis not present

## 2020-03-21 DIAGNOSIS — E876 Hypokalemia: Secondary | ICD-10-CM | POA: Diagnosis not present

## 2020-03-21 DIAGNOSIS — R072 Precordial pain: Secondary | ICD-10-CM | POA: Diagnosis present

## 2020-03-21 DIAGNOSIS — Z7901 Long term (current) use of anticoagulants: Secondary | ICD-10-CM | POA: Diagnosis not present

## 2020-03-21 DIAGNOSIS — R091 Pleurisy: Secondary | ICD-10-CM | POA: Diagnosis not present

## 2020-03-21 DIAGNOSIS — E039 Hypothyroidism, unspecified: Secondary | ICD-10-CM | POA: Diagnosis not present

## 2020-03-21 DIAGNOSIS — I361 Nonrheumatic tricuspid (valve) insufficiency: Secondary | ICD-10-CM

## 2020-03-21 DIAGNOSIS — I517 Cardiomegaly: Secondary | ICD-10-CM | POA: Diagnosis not present

## 2020-03-21 DIAGNOSIS — I2510 Atherosclerotic heart disease of native coronary artery without angina pectoris: Secondary | ICD-10-CM | POA: Diagnosis not present

## 2020-03-21 DIAGNOSIS — I340 Nonrheumatic mitral (valve) insufficiency: Secondary | ICD-10-CM | POA: Diagnosis not present

## 2020-03-21 DIAGNOSIS — I351 Nonrheumatic aortic (valve) insufficiency: Secondary | ICD-10-CM

## 2020-03-21 DIAGNOSIS — I200 Unstable angina: Secondary | ICD-10-CM | POA: Diagnosis not present

## 2020-03-21 DIAGNOSIS — R079 Chest pain, unspecified: Secondary | ICD-10-CM | POA: Diagnosis not present

## 2020-03-21 DIAGNOSIS — Z79899 Other long term (current) drug therapy: Secondary | ICD-10-CM | POA: Diagnosis not present

## 2020-03-21 DIAGNOSIS — R0600 Dyspnea, unspecified: Secondary | ICD-10-CM | POA: Diagnosis not present

## 2020-03-21 HISTORY — PX: LEFT HEART CATH AND CORONARY ANGIOGRAPHY: CATH118249

## 2020-03-21 LAB — I-STAT CHEM 8, ED
BUN: 11 mg/dL (ref 8–23)
Calcium, Ion: 0.98 mmol/L — ABNORMAL LOW (ref 1.15–1.40)
Chloride: 114 mmol/L — ABNORMAL HIGH (ref 98–111)
Creatinine, Ser: 0.4 mg/dL — ABNORMAL LOW (ref 0.44–1.00)
Glucose, Bld: 82 mg/dL (ref 70–99)
HCT: 31 % — ABNORMAL LOW (ref 36.0–46.0)
Hemoglobin: 10.5 g/dL — ABNORMAL LOW (ref 12.0–15.0)
Potassium: 2.5 mmol/L — CL (ref 3.5–5.1)
Sodium: 144 mmol/L (ref 135–145)
TCO2: 15 mmol/L — ABNORMAL LOW (ref 22–32)

## 2020-03-21 LAB — TROPONIN I (HIGH SENSITIVITY)
Troponin I (High Sensitivity): 10 ng/L (ref <18)
Troponin I (High Sensitivity): 9 ng/L (ref <18)

## 2020-03-21 LAB — CBC WITH DIFFERENTIAL/PLATELET
Abs Immature Granulocytes: 0.02 10*3/uL (ref 0.00–0.07)
Basophils Absolute: 0 10*3/uL (ref 0.0–0.1)
Basophils Relative: 0 %
Eosinophils Absolute: 0 10*3/uL (ref 0.0–0.5)
Eosinophils Relative: 0 %
HCT: 41.6 % (ref 36.0–46.0)
Hemoglobin: 13.7 g/dL (ref 12.0–15.0)
Immature Granulocytes: 0 %
Lymphocytes Relative: 20 %
Lymphs Abs: 2 10*3/uL (ref 0.7–4.0)
MCH: 32.6 pg (ref 26.0–34.0)
MCHC: 32.9 g/dL (ref 30.0–36.0)
MCV: 99 fL (ref 80.0–100.0)
Monocytes Absolute: 1.1 10*3/uL — ABNORMAL HIGH (ref 0.1–1.0)
Monocytes Relative: 11 %
Neutro Abs: 6.9 10*3/uL (ref 1.7–7.7)
Neutrophils Relative %: 69 %
Platelets: 210 10*3/uL (ref 150–400)
RBC: 4.2 MIL/uL (ref 3.87–5.11)
RDW: 13.6 % (ref 11.5–15.5)
WBC: 10.1 10*3/uL (ref 4.0–10.5)
nRBC: 0 % (ref 0.0–0.2)

## 2020-03-21 LAB — RESPIRATORY PANEL BY RT PCR (FLU A&B, COVID)
Influenza A by PCR: NEGATIVE
Influenza B by PCR: NEGATIVE
SARS Coronavirus 2 by RT PCR: NEGATIVE

## 2020-03-21 LAB — CBC
HCT: 31.9 % — ABNORMAL LOW (ref 36.0–46.0)
Hemoglobin: 10.3 g/dL — ABNORMAL LOW (ref 12.0–15.0)
MCH: 32.4 pg (ref 26.0–34.0)
MCHC: 32.3 g/dL (ref 30.0–36.0)
MCV: 100.3 fL — ABNORMAL HIGH (ref 80.0–100.0)
Platelets: 166 10*3/uL (ref 150–400)
RBC: 3.18 MIL/uL — ABNORMAL LOW (ref 3.87–5.11)
RDW: 13.8 % (ref 11.5–15.5)
WBC: 11.2 10*3/uL — ABNORMAL HIGH (ref 4.0–10.5)
nRBC: 0 % (ref 0.0–0.2)

## 2020-03-21 LAB — ECHOCARDIOGRAM COMPLETE
Area-P 1/2: 4.91 cm2
Calc EF: 30.5 %
Height: 63 in
P 1/2 time: 352 msec
S' Lateral: 3 cm
Single Plane A2C EF: 31.3 %
Single Plane A4C EF: 28.3 %
Weight: 2176 oz

## 2020-03-21 LAB — CREATININE, SERUM
Creatinine, Ser: 0.82 mg/dL (ref 0.44–1.00)
GFR, Estimated: >60 mL/min (ref >60)

## 2020-03-21 LAB — MAGNESIUM: Magnesium: 1.9 mg/dL (ref 1.7–2.4)

## 2020-03-21 LAB — FOLATE: Folate: 11.7 ng/mL (ref >5.9)

## 2020-03-21 LAB — LIPID PANEL
Cholesterol: 109 mg/dL (ref 0–200)
HDL: 57 mg/dL (ref >40)
LDL Cholesterol: 44 mg/dL (ref 0–99)
Total CHOL/HDL Ratio: 1.9 RATIO
Triglycerides: 38 mg/dL (ref <150)
VLDL: 8 mg/dL (ref 0–40)

## 2020-03-21 LAB — BRAIN NATRIURETIC PEPTIDE: B Natriuretic Peptide: 282.7 pg/mL — ABNORMAL HIGH (ref 0.0–100.0)

## 2020-03-21 LAB — VITAMIN B12: Vitamin B-12: 886 pg/mL (ref 180–914)

## 2020-03-21 SURGERY — LEFT HEART CATH AND CORONARY ANGIOGRAPHY
Anesthesia: LOCAL

## 2020-03-21 MED ORDER — SODIUM CHLORIDE 0.9 % IV SOLN: INTRAVENOUS | Status: DC

## 2020-03-21 MED ORDER — ALPRAZOLAM 0.25 MG PO TABS: 0.2500 mg | ORAL_TABLET | Freq: Two times a day (BID) | ORAL | Status: DC | PRN

## 2020-03-21 MED ORDER — LABETALOL HCL 5 MG/ML IV SOLN: 10.0000 mg | INTRAVENOUS | Status: AC | PRN

## 2020-03-21 MED ORDER — PROSIGHT PO TABS
1.0000 | ORAL_TABLET | Freq: Two times a day (BID) | ORAL | Status: DC
  Administered 2020-03-21 – 2020-03-22 (×3): 1 via ORAL
  Filled 2020-03-21 (×3): qty 1

## 2020-03-21 MED ORDER — ROSUVASTATIN CALCIUM 20 MG PO TABS
40.0000 mg | ORAL_TABLET | Freq: Every day | ORAL | Status: DC
  Administered 2020-03-21: 40 mg via ORAL
  Filled 2020-03-21: qty 2

## 2020-03-21 MED ORDER — MORPHINE SULFATE (PF) 2 MG/ML IV SOLN
1.0000 mg | INTRAVENOUS | Status: DC | PRN
  Administered 2020-03-21: 1 mg via INTRAVENOUS
  Filled 2020-03-21: qty 1

## 2020-03-21 MED ORDER — ONDANSETRON HCL 4 MG/2ML IJ SOLN: 4.0000 mg | Freq: Four times a day (QID) | INTRAMUSCULAR | Status: DC | PRN

## 2020-03-21 MED ORDER — HEPARIN SODIUM (PORCINE) 5000 UNIT/ML IJ SOLN
5000.0000 [IU] | Freq: Three times a day (TID) | INTRAMUSCULAR | Status: DC
  Administered 2020-03-21: 5000 [IU] via SUBCUTANEOUS
  Filled 2020-03-21: qty 1

## 2020-03-21 MED ORDER — VENLAFAXINE HCL ER 150 MG PO CP24
150.0000 mg | ORAL_CAPSULE | Freq: Every day | ORAL | Status: DC
  Administered 2020-03-21 – 2020-03-22 (×2): 150 mg via ORAL
  Filled 2020-03-21: qty 1
  Filled 2020-03-21: qty 2

## 2020-03-21 MED ORDER — HYDRALAZINE HCL 20 MG/ML IJ SOLN: 10.0000 mg | INTRAMUSCULAR | Status: AC | PRN

## 2020-03-21 MED ORDER — ZOLPIDEM TARTRATE 5 MG PO TABS: 5.0000 mg | ORAL_TABLET | Freq: Every evening | ORAL | Status: DC | PRN

## 2020-03-21 MED ORDER — VERAPAMIL HCL 2.5 MG/ML IV SOLN
INTRAVENOUS | Status: AC
  Filled 2020-03-21: qty 2

## 2020-03-21 MED ORDER — SODIUM CHLORIDE (PF) 0.9 % IJ SOLN
INTRAMUSCULAR | Status: AC
  Filled 2020-03-21: qty 50

## 2020-03-21 MED ORDER — SODIUM CHLORIDE 0.9 % IV SOLN: 250.0000 mL | INTRAVENOUS | Status: DC | PRN

## 2020-03-21 MED ORDER — LIDOCAINE HCL (PF) 1 % IJ SOLN
INTRAMUSCULAR | Status: AC
  Filled 2020-03-21: qty 30

## 2020-03-21 MED ORDER — SODIUM CHLORIDE 0.9 % IV SOLN: INTRAVENOUS | Status: AC

## 2020-03-21 MED ORDER — HEPARIN (PORCINE) IN NACL 1000-0.9 UT/500ML-% IV SOLN
INTRAVENOUS | Status: AC
  Filled 2020-03-21: qty 1000

## 2020-03-21 MED ORDER — SODIUM CHLORIDE 0.9% FLUSH: 3.0000 mL | INTRAVENOUS | Status: DC | PRN

## 2020-03-21 MED ORDER — ACETAMINOPHEN 325 MG PO TABS: 650.0000 mg | ORAL_TABLET | ORAL | Status: DC | PRN

## 2020-03-21 MED ORDER — ASPIRIN 81 MG PO CHEW
324.0000 mg | CHEWABLE_TABLET | Freq: Once | ORAL | Status: AC
  Administered 2020-03-21: 324 mg via ORAL
  Filled 2020-03-21: qty 4

## 2020-03-21 MED ORDER — SODIUM CHLORIDE 0.9% FLUSH: 3.0000 mL | Freq: Two times a day (BID) | INTRAVENOUS | Status: DC

## 2020-03-21 MED ORDER — HEPARIN (PORCINE) IN NACL 1000-0.9 UT/500ML-% IV SOLN
INTRAVENOUS | Status: DC | PRN
  Administered 2020-03-21 (×2): 500 mL

## 2020-03-21 MED ORDER — POTASSIUM CHLORIDE 10 MEQ/100ML IV SOLN
10.0000 meq | INTRAVENOUS | Status: AC
  Administered 2020-03-21 (×4): 10 meq via INTRAVENOUS
  Filled 2020-03-21 (×4): qty 100

## 2020-03-21 MED ORDER — LEVOTHYROXINE SODIUM 50 MCG PO TABS
75.0000 ug | ORAL_TABLET | Freq: Every day | ORAL | Status: DC
  Administered 2020-03-22: 75 ug via ORAL
  Filled 2020-03-21: qty 1

## 2020-03-21 MED ORDER — SODIUM CHLORIDE 0.9% FLUSH
3.0000 mL | Freq: Two times a day (BID) | INTRAVENOUS | Status: DC
  Administered 2020-03-21 – 2020-03-22 (×2): 3 mL via INTRAVENOUS

## 2020-03-21 MED ORDER — IOHEXOL 350 MG/ML SOLN
100.0000 mL | Freq: Once | INTRAVENOUS | Status: AC | PRN
  Administered 2020-03-21: 80 mL via INTRAVENOUS

## 2020-03-21 MED ORDER — HEPARIN (PORCINE) 25000 UT/250ML-% IV SOLN: 12.0000 [IU]/kg/h | INTRAVENOUS | Status: DC

## 2020-03-21 MED ORDER — POTASSIUM CHLORIDE CRYS ER 20 MEQ PO TBCR
60.0000 meq | EXTENDED_RELEASE_TABLET | Freq: Once | ORAL | Status: AC
  Administered 2020-03-21: 60 meq via ORAL
  Filled 2020-03-21: qty 3

## 2020-03-21 MED ORDER — HEPARIN SODIUM (PORCINE) 1000 UNIT/ML IJ SOLN
INTRAMUSCULAR | Status: AC
  Filled 2020-03-21: qty 1

## 2020-03-21 MED ORDER — ASPIRIN 81 MG PO CHEW
81.0000 mg | CHEWABLE_TABLET | Freq: Every day | ORAL | Status: DC
  Administered 2020-03-22: 81 mg via ORAL
  Filled 2020-03-21: qty 1

## 2020-03-21 MED ORDER — HEPARIN (PORCINE) 25000 UT/250ML-% IV SOLN
700.0000 [IU]/h | INTRAVENOUS | Status: DC
  Administered 2020-03-21: 700 [IU]/h via INTRAVENOUS
  Filled 2020-03-21: qty 250

## 2020-03-21 MED ORDER — CARVEDILOL 3.125 MG PO TABS
3.1250 mg | ORAL_TABLET | Freq: Every day | ORAL | Status: DC
  Administered 2020-03-21 – 2020-03-22 (×2): 3.125 mg via ORAL
  Filled 2020-03-21 (×2): qty 1

## 2020-03-21 MED ORDER — HEPARIN SODIUM (PORCINE) 5000 UNIT/ML IJ SOLN: 5000.0000 [IU] | Freq: Three times a day (TID) | INTRAMUSCULAR | Status: DC

## 2020-03-21 MED ORDER — ATORVASTATIN CALCIUM 80 MG PO TABS
80.0000 mg | ORAL_TABLET | Freq: Every day | ORAL | Status: DC
  Administered 2020-03-22: 80 mg via ORAL
  Filled 2020-03-21: qty 1
  Filled 2020-03-21: qty 2

## 2020-03-21 MED ORDER — HEPARIN SODIUM (PORCINE) 1000 UNIT/ML IJ SOLN
INTRAMUSCULAR | Status: DC | PRN
  Administered 2020-03-21: 3000 [IU] via INTRAVENOUS

## 2020-03-21 MED ORDER — NITROGLYCERIN 0.4 MG SL SUBL
0.4000 mg | SUBLINGUAL_TABLET | SUBLINGUAL | Status: AC | PRN
  Administered 2020-03-21 (×3): 0.4 mg via SUBLINGUAL
  Filled 2020-03-21 (×3): qty 1

## 2020-03-21 MED ORDER — HEPARIN BOLUS VIA INFUSION
3000.0000 [IU] | Freq: Once | INTRAVENOUS | Status: AC
  Administered 2020-03-21: 3000 [IU] via INTRAVENOUS
  Filled 2020-03-21: qty 3000

## 2020-03-21 MED ORDER — CLOPIDOGREL BISULFATE 75 MG PO TABS
75.0000 mg | ORAL_TABLET | Freq: Every day | ORAL | Status: DC
  Administered 2020-03-22: 75 mg via ORAL
  Filled 2020-03-21: qty 1

## 2020-03-21 MED ORDER — ACETAMINOPHEN 325 MG PO TABS
650.0000 mg | ORAL_TABLET | ORAL | Status: DC | PRN
  Filled 2020-03-21: qty 2

## 2020-03-21 MED ORDER — IOHEXOL 350 MG/ML SOLN
INTRAVENOUS | Status: DC | PRN
  Administered 2020-03-21: 60 mL

## 2020-03-21 MED ORDER — ASPIRIN 81 MG PO CHEW: 81.0000 mg | CHEWABLE_TABLET | ORAL | Status: DC

## 2020-03-21 MED ORDER — CLOPIDOGREL BISULFATE 75 MG PO TABS
75.0000 mg | ORAL_TABLET | Freq: Every day | ORAL | Status: DC
  Administered 2020-03-21: 75 mg via ORAL
  Filled 2020-03-21: qty 1

## 2020-03-21 MED ORDER — LIDOCAINE HCL (PF) 1 % IJ SOLN
INTRAMUSCULAR | Status: DC | PRN
  Administered 2020-03-21: 2 mL

## 2020-03-21 MED ORDER — OMEGA-3-ACID ETHYL ESTERS 1 G PO CAPS
1.0000 g | ORAL_CAPSULE | Freq: Every day | ORAL | Status: DC
  Administered 2020-03-21: 1 g via ORAL
  Filled 2020-03-21: qty 1

## 2020-03-21 MED ORDER — MORPHINE SULFATE (PF) 2 MG/ML IV SOLN
2.0000 mg | INTRAVENOUS | Status: DC | PRN
  Administered 2020-03-21: 2 mg via INTRAVENOUS
  Filled 2020-03-21: qty 1

## 2020-03-21 MED ORDER — VERAPAMIL HCL 2.5 MG/ML IV SOLN
INTRA_ARTERIAL | Status: DC | PRN
  Administered 2020-03-21: 5 mL via INTRA_ARTERIAL

## 2020-03-21 MED ORDER — VITAMIN B-12 1000 MCG PO TABS
2000.0000 ug | ORAL_TABLET | Freq: Every day | ORAL | Status: DC
  Administered 2020-03-21 – 2020-03-22 (×2): 2000 ug via ORAL
  Filled 2020-03-21 (×2): qty 2

## 2020-03-21 MED ORDER — VITAMIN D 25 MCG (1000 UNIT) PO TABS
1000.0000 [IU] | ORAL_TABLET | Freq: Every day | ORAL | Status: DC
  Administered 2020-03-22: 1000 [IU] via ORAL
  Filled 2020-03-21: qty 1

## 2020-03-21 SURGICAL SUPPLY — 11 items

## 2020-03-21 NOTE — ED Notes (Signed)
Cardiology at bedside to assess patient

## 2020-03-21 NOTE — ED Provider Notes (Addendum)
[facility] DEPT Provider Note   CSN: [id number] Arrival date & time: 03/20/20  2235     History Chief Complaint  Patient presents with  . needs covid test    Susan [name] is a 86 y.o. female.  The history is provided by the patient.  Chest Pain Pain location:  Substernal area Pain quality: dull   Pain radiates to:  Does not radiate Pain severity:  Moderate Onset quality:  Gradual Timing:  Constant Progression:  Unchanged Chronicity:  Recurrent Context: at rest   Relieved by:  Nothing Worsened by:  Nothing Ineffective treatments:  None tried Associated symptoms: shortness of breath   Associated symptoms: no abdominal pain, no diaphoresis and no dizziness   Risk factors: coronary artery disease   Patient with CAD presents with CP and SOB and unable to rest at home.  She initially thought this was due to her covid booster that she received on Monday but did not have symptoms on Monday or Tuesday.      Past Medical History:  Diagnosis Date  . Coronary artery disease    POST PTCA AND STENTING OF HER RIGHT CORONARY ARTERY  . Hypothyroidism   . LAD stenosis    MODERATE 50-60% STENOSIS  . Osteoporosis     Patient Active Problem List   Diagnosis Date Noted  . Syncope and collapse 11/13/2018  . Syncope 11/12/2018  . Fall 11/12/2018  . Right clavicle fracture 11/12/2018  . Chronic systolic CHF (congestive heart failure) ([place]) 02/11/2018  . Coronary artery disease   . Dyspnea on exertion 10/08/2017  . Cardiomyopathy ([place]) 10/08/2017  . Posterior vitreous detachment of both eyes 04/28/2016  . Hyperlipidemia 07/18/2014  . Nonexudative age-related macular degeneration 07/17/2014  . Status post intraocular lens implant 07/17/2014  . Pseudophakia 10/25/2013  . Anxiety 10/11/2013  . Mechanical complication of intraocular lens 08/12/2012  . Nuclear sclerosis 08/12/2012  . Posterior capsule opacification, right 08/12/2012  . Macular  degeneration 08/04/2011  . Pituitary adenoma ([place]) 07/15/2011  . Arteriosclerosis of coronary artery   . Hypothyroidism     Past Surgical History:  Procedure Laterality Date  . APPENDECTOMY  1970s?   "ruptured"   . BACK SURGERY    . CATARACT EXTRACTION W/ INTRAOCULAR LENS  IMPLANT, BILATERAL Bilateral   . CORONARY ANGIOPLASTY WITH STENT PLACEMENT  ~ 2009   "2 stents"  . FRACTURE SURGERY    . IR VERTEBROPLASTY CERV/THOR BX INC UNI/BIL INC/INJECT/IMAGING  02/07/2018  . KNEE ARTHROSCOPY Right     torn meniscus  . LUMBAR LAMINECTOMY  1978  . OVARIAN CYST SURGERY  1970s?   "ruptured"   . RIGHT/LEFT HEART CATH AND CORONARY ANGIOGRAPHY N/A 10/08/2017   Procedure: RIGHT/LEFT HEART CATH AND CORONARY ANGIOGRAPHY;  Surgeon: Nelva Bush, MD;  Location: [facility];  Service: Cardiovascular;  Laterality: N/A;  . WRIST FRACTURE SURGERY Bilateral    "2 surgeries for 2 breaks on left side; 1 surgery for 1 break on right wrist"     OB History   No obstetric history on file.     Family History  Problem Relation Age of Onset  . Alzheimer's disease Mother   . Heart attack Father   . Aneurysm Sister     Social History   Tobacco Use  . Smoking status: Never Smoker  . Smokeless tobacco: Never Used  Vaping Use  . Vaping Use: Never used  Substance Use Topics  . Alcohol use: Yes    Alcohol/week: 1.0  standard drink    Types: 1 Glasses of wine per week  . Drug use: Never    Home Medications Prior to Admission medications   Medication Sig Start Date End Date Taking? Authorizing Provider  acetaminophen (TYLENOL) 325 MG tablet Take 2 tablets (650 mg total) by mouth every 6 (six) hours. Patient taking differently: Take 650 mg by mouth every 6 (six) hours as needed for mild pain.  11/14/18  Yes Johnson, Clanford L, MD  Calcium Carbonate-Vitamin D (CALCIUM + D PO) Take 2 tablets by mouth at bedtime.    Yes [provider]  carvedilol (COREG) 3.125 MG tablet Take 3.125 mg by  mouth daily.   Yes [provider]  cholecalciferol (VITAMIN D3) 25 MCG (1000 UT) tablet Take 1,000 Units by mouth daily after breakfast.   Yes [provider]  clopidogrel (PLAVIX) 75 MG tablet Take 1 tablet by mouth once daily 01/19/20  Yes Nahser, Wonda Cheng, MD  levothyroxine (SYNTHROID, LEVOTHROID) 75 MCG tablet Take 75 mcg by mouth daily before breakfast.  11/30/17  Yes [provider]  multivitamin-lutein (OCUVITE-LUTEIN) CAPS capsule Take 1 capsule by mouth 2 (two) times a day.    Yes [provider]  nitroGLYCERIN (NITROSTAT) 0.4 MG SL tablet Place 1 tablet (0.4 mg total) under the tongue every 5 (five) minutes as needed. 06/22/11  Yes Nahser, Wonda Cheng, MD  Omega-3 Fatty Acids (FISH OIL) 1200 MG CAPS Take 1,200 mg by mouth at bedtime.   Yes [provider]  rosuvastatin (CRESTOR) 40 MG tablet Take 1 tablet (40 mg total) by mouth daily. 02/14/20  Yes Nahser, Wonda Cheng, MD  venlafaxine XR (EFFEXOR-XR) 150 MG 24 hr capsule Take 1 capsule by mouth once daily 01/30/20  Yes Nahser, Wonda Cheng, MD  vitamin B-12 (CYANOCOBALAMIN) 1000 MCG tablet Take 2,000 mcg by mouth daily.    Yes [provider]    Allergies    Patient has no known allergies.  Review of Systems   Review of Systems  Constitutional: Negative for diaphoresis.  HENT: Negative for congestion.   Eyes: Negative for visual disturbance.  Respiratory: Positive for shortness of breath.   Cardiovascular: Positive for chest pain.  Gastrointestinal: Negative for abdominal pain.  Genitourinary: Negative for difficulty urinating.  Musculoskeletal: Negative for gait problem.  Skin: Negative for rash.  Neurological: Negative for dizziness.  Psychiatric/Behavioral: Negative for agitation.  All other systems reviewed and are negative.   Physical Exam Updated Vital Signs BP 122/72   Pulse 100   Temp 98.5 F (36.9 C) (Oral)   Resp (!) 27   Ht 5\' 3"  (1.6 m)   Wt 61.7 kg   SpO2 91%    BMI 24.09 kg/m   Physical Exam Vitals and nursing note reviewed.  Constitutional:      Appearance: Normal appearance. She is not diaphoretic.  HENT:     Head: Normocephalic and atraumatic.     Nose: Nose normal.  Eyes:     Conjunctiva/sclera: Conjunctivae normal.     Pupils: Pupils are equal, round, and reactive to light.  Cardiovascular:     Rate and Rhythm: Normal rate and regular rhythm.     Pulses: Normal pulses.     Heart sounds: Normal heart sounds.  Pulmonary:     Effort: Pulmonary effort is normal.     Breath sounds: Normal breath sounds.  Abdominal:     General: Abdomen is flat. Bowel sounds are normal.     Palpations: Abdomen is soft.  Tenderness: There is no abdominal tenderness. There is no guarding.  Musculoskeletal:        General: Normal range of motion.     Cervical back: Normal range of motion and neck supple.  Skin:    General: Skin is warm and dry.     Capillary Refill: Capillary refill takes less than 2 seconds.  Neurological:     General: No focal deficit present.     Mental Status: She is alert and oriented to person, place, and time.     Deep Tendon Reflexes: Reflexes normal.  Psychiatric:        Mood and Affect: Mood normal.        Behavior: Behavior normal.     ED Results / Procedures / Treatments   Labs (all labs ordered are listed, but only abnormal results are displayed) Labs Reviewed  CBC WITH DIFFERENTIAL/PLATELET - Abnormal; Notable for the following components:      Result Value   Monocytes Absolute 1.1 (*)    All other components within normal limits  I-STAT CHEM 8, ED - Abnormal; Notable for the following components:   Potassium 2.5 (*)    Chloride 114 (*)    Creatinine, Ser 0.40 (*)    Calcium, Ion 0.98 (*)    TCO2 15 (*)    Hemoglobin 10.5 (*)    HCT 31.0 (*)    All other components within normal limits  RESPIRATORY PANEL BY RT PCR (FLU A&B, COVID)  HEPARIN LEVEL (UNFRACTIONATED)  CBC  TROPONIN I (HIGH SENSITIVITY)    TROPONIN I (HIGH SENSITIVITY)    EKG EKG Interpretation  Date/Time:  Thursday March 21 2020 01:29:54 EST Ventricular Rate:  95 PR Interval:    QRS Duration: 130 QT Interval:  364 QTC Calculation: 458 R Axis:   -26 Text Interpretation: Sinus rhythm Multiple ventricular premature complexes Left bundle branch block Confirmed by Randal Buba, Emmilia Sowder ([id number]) on 03/21/2020 1:42:24 AM   Radiology DG Chest 2 View  Result Date: 03/21/2020 CLINICAL DATA:  86 year old female with shortness of breath following recent COVID-19 booster. EXAM: CHEST - 2 VIEW COMPARISON:  Chest radiographs 02/04/2018 and earlier. FINDINGS: Stable lung volumes and mediastinal contours since 2019. Mild cardiomegaly. Visualized tracheal air column is within normal limits. No pneumothorax, pulmonary edema, pleural effusion or confluent pulmonary opacity. Upper thoracic vertebral augmentation since the prior study. Abdominal Calcified aortic atherosclerosis. Negative visible bowel gas pattern. IMPRESSION: 1. No acute cardiopulmonary abnormality. 2. Mild cardiomegaly.  Aortic Atherosclerosis (ICD10-I70.0). A written preliminary report of this exam during a planned IT downtime was issued by Dr. Jesse Fall at 2353 hours. Electronically Signed   By: Genevie Ann M.D.   On: 03/21/2020 01:51   CT Angio Chest PE W and/or Wo Contrast  Result Date: 03/21/2020 CLINICAL DATA:  Chest pain, dyspnea EXAM: CT ANGIOGRAPHY CHEST WITH CONTRAST TECHNIQUE: Multidetector CT imaging of the chest was performed using the standard protocol during bolus administration of intravenous contrast. Multiplanar CT image reconstructions and MIPs were obtained to evaluate the vascular anatomy. CONTRAST:  69mL OMNIPAQUE IOHEXOL 350 MG/ML SOLN COMPARISON:  None. FINDINGS: Cardiovascular: There is adequate opacification of the pulmonary arterial tree through the segmental level. There is no intraluminal filling defect to suggest acute pulmonary embolism. The central  pulmonary arteries are enlarged in keeping with changes of pulmonary arterial hypertension. There is extensive multi-vessel coronary artery calcification, grossly stable since prior examination. There is mild global cardiomegaly with left ventricular dilation noted. No pericardial effusion. The thoracic aortic arch  is dilated measuring 3.2 cm in maximal diameter and measuring 2.9 cm in diameter at the level of the left atrium. Mild atherosclerotic calcification is seen within the thoracic aorta. Mediastinum/Nodes: No enlarged mediastinal, hilar, or axillary lymph nodes. Thyroid gland, trachea, and esophagus demonstrate no significant findings. Lungs/Pleura: There is mild bibasilar atelectasis or scarring. The lungs are otherwise clear. No pneumothorax or pleural effusion. Central airways are widely patent. Upper Abdomen: No acute abnormality. Musculoskeletal: The osseous structures are diffusely osteopenic. There is progressive loss of height of a T6 compression fracture when compared to prior examination of 01/31/2018 with mild focal kyphosis at this level as a result. Multiple healed right rib fractures are noted. Remote fracture deformity of the right clavicle is partially visualized. No acute bone abnormality. Review of the MIP images confirms the above findings. IMPRESSION: No pulmonary embolism. Extensive multi-vessel coronary artery calcification. Left ventricular dilation, progressive since prior examination. Central pulmonary artery enlargement in keeping with pulmonary arterial hypertension. Dilation of the thoracic aortic arch measuring 3.2 cm. Recommend annual imaging followup by CTA or MRA. This recommendation follows 2010 ACCF/AHA/AATS/ACR/ASA/SCA/SCAI/SIR/STS/SVM Guidelines for the Diagnosis and Management of Patients with Thoracic Aortic Disease. Circulation.2010; 121: H631-S970. Aortic aneurysm NOS (ICD10-I71.9) Aortic aneurysm NOS (ICD10-I71.9). Electronically Signed   By: Fidela Salisbury MD   On:  03/21/2020 04:31    Procedures Procedures (including critical care time)  Medications Ordered in ED Medications  heparin ADULT infusion 100 units/mL (25000 units/292mL sodium chloride 0.45%) (700 Units/hr Intravenous New Bag/Given 03/21/20 0314)  sodium chloride (PF) 0.9 % injection (has no administration in time range)  aspirin chewable tablet 324 mg (324 mg Oral Given 03/21/20 0147)  nitroGLYCERIN (NITROSTAT) SL tablet 0.4 mg (0.4 mg Sublingual Given 03/21/20 0323)  potassium chloride SA (KLOR-CON) CR tablet 60 mEq (60 mEq Oral Given 03/21/20 0231)  heparin bolus via infusion 3,000 Units (3,000 Units Intravenous Bolus from Bag 03/21/20 0313)  iohexol (OMNIPAQUE) 350 MG/ML injection 100 mL (80 mLs Intravenous Contrast Given 03/21/20 0353)    ED Course  I have reviewed the triage vital signs and the nursing notes.  Pertinent labs & imaging results that were available during my care of the patient were reviewed by me and considered in my medical decision making (see chart for details).    MDM Reviewed: previous chart, nursing note and vitals Interpretation: labs, ECG, x-ray and CT scan (cardiomegaly by me on CXR No PE by me on CTA) Total time providing critical care: 30-74 minutes (heparin drip ). This excludes time spent performing separately reportable procedures and services. Consults: admitting MD  CRITICAL CARE Performed by: Geniene List K Brynja Marker-Rasch Total critical care time: 60 minutes Critical care time was exclusive of separately billable procedures and treating other patients. Critical care was necessary to treat or prevent imminent or life-threatening deterioration. Critical care was time spent personally by me on the following activities: development of treatment plan with patient and/or surrogate as well as nursing, discussions with consultants, evaluation of patient's response to treatment, examination of patient, obtaining history from patient or surrogate, ordering and  performing treatments and interventions, ordering and review of laboratory studies, ordering and review of radiographic studies, pulse oximetry and re-evaluation of patient's condition.  Final Clinical Impression(s) / ED Diagnoses Final diagnoses:  Unstable angina [facility])   Admit to medicine    Kerryann Allaire, MD 03/21/20 [place], Lucca Ballo, MD 03/21/20 [id number]

## 2020-03-21 NOTE — ED Notes (Signed)
Spoke to patient's daughter, Margarita Grizzle, and informed her of patients transfer.

## 2020-03-21 NOTE — ED Notes (Signed)
Call received from pt daughter Cristal Ford [phone] requesting rtn call for pt status/updates when possible. [employer]

## 2020-03-21 NOTE — H&P (View-Only) (Signed)
Cardiology Consultation:   Patient ID: Susan [name] MRN: [id number]; DOB: 04-Mar-1934  Admit date: 03/20/2020 Date of Consult: 03/21/2020  Primary Care Provider: Crist Infante, MD [facility] Cardiologist: Mertie Moores, MD  [place] Electrophysiologist:  None    Patient Profile:   Susan [name] is a 86 y.o. female with a hx of CAD hx stent to RCA  2005, also mod prox LAD stenosis, hypothyroid, HLD, decreased EF 40-45 %, LBBB,  who is being seen today for the evaluation of chest pressure at the request of Dr. Marylyn Ishihara.  History of Present Illness:   Susan [name] with above hx and last cath 10/2017 with mod to severe mLAD stenosis 60-70%, increased form 2012, mid and distal RCA stents patent. Non obstructive LCX and RCA disease.   NICM with EF 25-30%.  meds adjusted and last echo 2020 EF up to 40-45% , impaired LV relaxation.  Septal dyssynergy due to LBBB.  She has been active playing tennis until 2-3 years ago. Still active walks for exercise.Marland Kitchen    Pt presented to ER last evening with chest pain and SOB.  She has been vaccinated with J&J and booster.   In ER she was given ASA and 3 NTG,  CTA neg for PE, no PNA. Dilation of the thoracic aortic arch measuring 3.2 cm and extensive multivessel CAD    IV heparin was started.    EKG:  The EKG was personally reviewed and demonstrates:  SR at 95 and LBBB no change from 06/15/19  Telemetry:  Telemetry was personally reviewed and demonstrates:  SR  Hs troponin 10, 9  BNP 282 Na 144, K+ 2.5 Cr 0.40 golate 11 B12 886 WBC 11.2 Hgb was 13.7 now on heparin 10.3 plts 166  Neg covid Echo pending   BP 97/59 P 76 afebrile  Still with pain she has needed pain meds    Past Medical History:  Diagnosis Date   Coronary artery disease    POST PTCA AND STENTING OF HER RIGHT CORONARY ARTERY   Hypothyroidism    LAD stenosis    MODERATE 50-60% STENOSIS   Osteoporosis     Past Surgical History:  Procedure Laterality Date   APPENDECTOMY   1970s?   "ruptured"    BACK SURGERY     CATARACT EXTRACTION W/ INTRAOCULAR LENS  IMPLANT, BILATERAL Bilateral    CORONARY ANGIOPLASTY WITH STENT PLACEMENT  ~ 2009   "2 stents"   FRACTURE SURGERY     IR VERTEBROPLASTY CERV/THOR BX INC UNI/BIL INC/INJECT/IMAGING  02/07/2018   KNEE ARTHROSCOPY Right     torn meniscus   LUMBAR LAMINECTOMY  1978   OVARIAN CYST SURGERY  1970s?   "ruptured"    RIGHT/LEFT HEART CATH AND CORONARY ANGIOGRAPHY N/A 10/08/2017   Procedure: RIGHT/LEFT HEART CATH AND CORONARY ANGIOGRAPHY;  Surgeon: Nelva Bush, MD;  Location: [facility];  Service: Cardiovascular;  Laterality: N/A;   WRIST FRACTURE SURGERY Bilateral    "2 surgeries for 2 breaks on left side; 1 surgery for 1 break on right wrist"     Home Medications:  Prior to Admission medications   Medication Sig Start Date End Date Taking? Authorizing Provider  acetaminophen (TYLENOL) 325 MG tablet Take 2 tablets (650 mg total) by mouth every 6 (six) hours. Patient taking differently: Take 650 mg by mouth every 6 (six) hours as needed for mild pain.  11/14/18  Yes Johnson, Clanford L, MD  Calcium Carbonate-Vitamin D (CALCIUM + D PO) Take 2 tablets by  mouth at bedtime.    Yes [provider]  carvedilol (COREG) 3.125 MG tablet Take 3.125 mg by mouth daily.   Yes [provider]  cholecalciferol (VITAMIN D3) 25 MCG (1000 UT) tablet Take 1,000 Units by mouth daily after breakfast.   Yes [provider]  clopidogrel (PLAVIX) 75 MG tablet Take 1 tablet by mouth once daily 01/19/20  Yes Nahser, Wonda Cheng, MD  levothyroxine (SYNTHROID, LEVOTHROID) 75 MCG tablet Take 75 mcg by mouth daily before breakfast.  11/30/17  Yes [provider]  multivitamin-lutein (OCUVITE-LUTEIN) CAPS capsule Take 1 capsule by mouth 2 (two) times a day.    Yes [provider]  nitroGLYCERIN (NITROSTAT) 0.4 MG SL tablet Place 1 tablet (0.4 mg total) under the tongue every 5 (five) minutes as  needed. 06/22/11  Yes Nahser, Wonda Cheng, MD  Omega-3 Fatty Acids (FISH OIL) 1200 MG CAPS Take 1,200 mg by mouth at bedtime.   Yes [provider]  rosuvastatin (CRESTOR) 40 MG tablet Take 1 tablet (40 mg total) by mouth daily. 02/14/20  Yes Nahser, Wonda Cheng, MD  venlafaxine XR (EFFEXOR-XR) 150 MG 24 hr capsule Take 1 capsule by mouth once daily 01/30/20  Yes Nahser, Wonda Cheng, MD  vitamin B-12 (CYANOCOBALAMIN) 1000 MCG tablet Take 2,000 mcg by mouth daily.    Yes [provider]    Inpatient Medications: Scheduled Meds:  carvedilol  3.125 mg Oral Daily   [START ON 03/22/2020] cholecalciferol  1,000 Units Oral QPC breakfast   clopidogrel  75 mg Oral Daily   heparin  5,000 Units Subcutaneous Q8H   heparin injection (subcutaneous)  5,000 Units Subcutaneous Q8H   [START ON 03/22/2020] levothyroxine  75 mcg Oral QAC breakfast   multivitamin  1 tablet Oral BID   omega-3 acid ethyl esters  1 g Oral QHS   rosuvastatin  40 mg Oral Daily   venlafaxine XR  150 mg Oral Daily   vitamin B-12  2,000 mcg Oral Daily   Continuous Infusions:   PRN Meds: acetaminophen, ALPRAZolam, morphine injection, ondansetron (ZOFRAN) IV, zolpidem  Allergies:   No Known Allergies  Social History:   Social History   Socioeconomic History   Marital status: Widowed    Spouse name: Not on file   Number of children: Not on file   Years of education: Not on file   Highest education level: Not on file  Occupational History   Not on file  Tobacco Use   Smoking status: Never Smoker   Smokeless tobacco: Never Used  Vaping Use   Vaping Use: Never used  Substance and Sexual Activity   Alcohol use: Yes    Alcohol/week: 1.0 standard drink    Types: 1 Glasses of wine per week   Drug use: Never   Sexual activity: Not on file  Other Topics Concern   Not on file  Social History Narrative   Not on file   Social Determinants of Health   Financial Resource Strain:    Difficulty of Paying Living  Expenses: Not on file  Food Insecurity:    Worried About [place] in the Last Year: Not on file   Ran Out of Food in the Last Year: Not on file  Transportation Needs:    Lack of Transportation (Medical): Not on file   Lack of Transportation (Non-Medical): Not on file  Physical Activity:    Days of Exercise per Week: Not on file   Minutes of Exercise per Session: Not  on file  Stress:    Feeling of Stress : Not on file  Social Connections:    Frequency of Communication with Friends and Family: Not on file   Frequency of Social Gatherings with Friends and Family: Not on file   Attends Religious Services: Not on file   Active Member of Clubs or Organizations: Not on file   Attends Archivist Meetings: Not on file   Marital Status: Not on file  Intimate Partner Violence:    Fear of Current or Ex-Partner: Not on file   Emotionally Abused: Not on file   Physically Abused: Not on file   Sexually Abused: Not on file    Family History:    Family History  Problem Relation Age of Onset   Alzheimer's disease Mother    Heart attack Father    Aneurysm Sister      ROS:  Please see the history of present illness.  General:no colds or fevers, no weight changes Skin:no rashes or ulcers HEENT:no blurred vision, no congestion CV:see HPI PUL:see HPI GI:no diarrhea constipation or melena, no indigestion GU:no hematuria, no dysuria MS:no joint pain, no claudication Neuro:no syncope, no lightheadedness Endo:no diabetes, + thyroid disease  All other ROS reviewed and negative.     Physical Exam/Data:   Vitals:   03/21/20 1224 03/21/20 1230 03/21/20 1348 03/21/20 1418  BP: (!) 94/57 110/68 100/64 105/67  Pulse: 89 87 86 84  Resp: 18 (!) 28 (!) 27   Temp:      TempSrc:      SpO2: 97% 95% 93%   Weight:      Height:       No intake or output data in the 24 hours ending 03/21/20 1424 Last 3 Weights 03/20/2020 06/15/2019 12/12/2018  Weight (lbs) 136 lb 136 lb 1.9 oz  134 lb 1.9 oz  Weight (kg) 61.689 kg 61.744 kg 60.836 kg     Body mass index is 24.09 kg/m.  General:  Well nourished, well developed, in no acute distress though painful with deep breath mild SOB HEENT: normal Lymph: no adenopathy Neck: no JVD Endocrine:  No thryomegaly Vascular: No carotid bruits; peal pulses 2+ bilaterally  Cardiac:  normal S1, S2; RRR; no murmur gallup rub or click Lungs:  clear to auscultation bilaterally, no wheezing, rhonchi or rales  Abd: soft, nontender, no hepatomegaly  Ext: no edema Musculoskeletal:  No deformities, BUE and BLE strength normal and equal Skin: warm and dry  Neuro:  Alert and oriented X 3 MAE follows commands, no focal abnormalities noted Psych:  Normal affect    Relevant CV Studies: Echo  IMPRESSIONS     1. Left ventricular ejection fraction, by estimation, is 35 to 40%. The  left ventricle has moderately decreased function. The left ventricle  demonstrates regional wall motion abnormalities. Abnormal (paradoxical)  septal motion, consistent with left  bundle branch block. Inferior hypokinesis. There is moderate asymmetric  left ventricular hypertrophy of the basal-septal segment. Left ventricular  diastolic parameters are consistent with Grade II diastolic dysfunction  (pseudonormalization). Elevated  left atrial pressure.   2. Right ventricular systolic function is normal. The right ventricular  size is normal. There is normal pulmonary artery systolic pressure. The  estimated right ventricular systolic pressure is 16.1 mmHg.   3. The mitral valve is abnormal. Mild mitral valve regurgitation.  Moderate mitral annular calcification.   4. The aortic valve was not well visualized. Aortic valve regurgitation  is mild to moderate. Mild to moderate  aortic valve sclerosis/calcification  is present, without any evidence of aortic stenosis.   5. The inferior vena cava is normal in size with greater than 50%  respiratory variability,  suggesting right atrial pressure of 3 mmHg.   FINDINGS   Left Ventricle: Left ventricular ejection fraction, by estimation, is 35  to 40%. The left ventricle has moderately decreased function. The left  ventricle demonstrates regional wall motion abnormalities. The left  ventricular internal cavity size was  normal in size. There is moderate asymmetric left ventricular hypertrophy  of the basal-septal segment. Abnormal (paradoxical) septal motion,  consistent with left bundle branch block. Left ventricular diastolic  parameters are consistent with Grade II  diastolic dysfunction (pseudonormalization). Elevated left atrial  pressure.   Right Ventricle: The right ventricular size is normal. Right vetricular  wall thickness was not assessed. Right ventricular systolic function is  normal. There is normal pulmonary artery systolic pressure. The tricuspid  regurgitant velocity is 2.50 m/s,  and with an assumed right atrial pressure of 3 mmHg, the estimated right  ventricular systolic pressure is 61.6 mmHg.   Laboratory Data:  High Sensitivity Troponin:   Recent Labs  Lab 03/21/20 0039 03/21/20 0239  TROPONINIHS 10 9     Chemistry Recent Labs  Lab 03/21/20 0223  NA 144  K 2.5*  CL 114*  GLUCOSE 82  BUN 11  CREATININE 0.40*    No results for input(s): PROT, ALBUMIN, AST, ALT, ALKPHOS, BILITOT in the last 168 hours. Hematology Recent Labs  Lab 03/21/20 0039 03/21/20 0223 03/21/20 0500  WBC 10.1  --  11.2*  RBC 4.20  --  3.18*  HGB 13.7 10.5* 10.3*  HCT 41.6 31.0* 31.9*  MCV 99.0  --  100.3*  MCH 32.6  --  32.4  MCHC 32.9  --  32.3  RDW 13.6  --  13.8  PLT 210  --  166   BNP Recent Labs  Lab 03/21/20 0856  BNP 282.7*    DDimer No results for input(s): DDIMER in the last 168 hours.   Radiology/Studies:  DG Chest 2 View  Result Date: 03/21/2020 CLINICAL DATA:  86 year old female with shortness of breath following recent COVID-19 booster. EXAM: CHEST - 2  VIEW COMPARISON:  Chest radiographs 02/04/2018 and earlier. FINDINGS: Stable lung volumes and mediastinal contours since 2019. Mild cardiomegaly. Visualized tracheal air column is within normal limits. No pneumothorax, pulmonary edema, pleural effusion or confluent pulmonary opacity. Upper thoracic vertebral augmentation since the prior study. Abdominal Calcified aortic atherosclerosis. Negative visible bowel gas pattern. IMPRESSION: 1. No acute cardiopulmonary abnormality. 2. Mild cardiomegaly.  Aortic Atherosclerosis (ICD10-I70.0). A written preliminary report of this exam during a planned IT downtime was issued by Dr. Jesse Fall at 2353 hours. Electronically Signed   By: Genevie Ann M.D.   On: 03/21/2020 01:51   CT Angio Chest PE W and/or Wo Contrast  Result Date: 03/21/2020 CLINICAL DATA:  Chest pain, dyspnea EXAM: CT ANGIOGRAPHY CHEST WITH CONTRAST TECHNIQUE: Multidetector CT imaging of the chest was performed using the standard protocol during bolus administration of intravenous contrast. Multiplanar CT image reconstructions and MIPs were obtained to evaluate the vascular anatomy. CONTRAST:  46m OMNIPAQUE IOHEXOL 350 MG/ML SOLN COMPARISON:  None. FINDINGS: Cardiovascular: There is adequate opacification of the pulmonary arterial tree through the segmental level. There is no intraluminal filling defect to suggest acute pulmonary embolism. The central pulmonary arteries are enlarged in keeping with changes of pulmonary arterial hypertension. There is extensive multi-vessel coronary artery calcification,  grossly stable since prior examination. There is mild global cardiomegaly with left ventricular dilation noted. No pericardial effusion. The thoracic aortic arch is dilated measuring 3.2 cm in maximal diameter and measuring 2.9 cm in diameter at the level of the left atrium. Mild atherosclerotic calcification is seen within the thoracic aorta. Mediastinum/Nodes: No enlarged mediastinal, hilar, or axillary lymph  nodes. Thyroid gland, trachea, and esophagus demonstrate no significant findings. Lungs/Pleura: There is mild bibasilar atelectasis or scarring. The lungs are otherwise clear. No pneumothorax or pleural effusion. Central airways are widely patent. Upper Abdomen: No acute abnormality. Musculoskeletal: The osseous structures are diffusely osteopenic. There is progressive loss of height of a T6 compression fracture when compared to prior examination of 01/31/2018 with mild focal kyphosis at this level as a result. Multiple healed right rib fractures are noted. Remote fracture deformity of the right clavicle is partially visualized. No acute bone abnormality. Review of the MIP images confirms the above findings. IMPRESSION: No pulmonary embolism. Extensive multi-vessel coronary artery calcification. Left ventricular dilation, progressive since prior examination. Central pulmonary artery enlargement in keeping with pulmonary arterial hypertension. Dilation of the thoracic aortic arch measuring 3.2 cm. Recommend annual imaging followup by CTA or MRA. This recommendation follows 2010 ACCF/AHA/AATS/ACR/ASA/SCA/SCAI/SIR/STS/SVM Guidelines for the Diagnosis and Management of Patients with Thoracic Aortic Disease. Circulation.2010; 121: F643-P295. Aortic aneurysm NOS (ICD10-I71.9) Aortic aneurysm NOS (ICD10-I71.9). Electronically Signed   By: Fidela Salisbury MD   On: 03/21/2020 04:31   ECHOCARDIOGRAM COMPLETE  Result Date: 03/21/2020    ECHOCARDIOGRAM REPORT   Patient Name:   Susan [name] Date of Exam: 03/21/2020 Medical Rec #:  [id number]        Height:       63.0 in Accession #:    [id number]       Weight:       136.0 lb Date of Birth:  24-Apr-1934         BSA:          1.641 m Patient Age:    56 years         BP:           102/66 mmHg Patient Gender: F                HR:           91 bpm. Exam Location:  Inpatient Procedure: 2D Echo, 3D Echo, Strain Analysis, Color Doppler and Cardiac Doppler Indications:    Chest Pain  786.50 / R07.9  History:        Patient has prior history of Echocardiogram examinations, most                 recent 11/13/2018. CAD, Arrythmias:LBBB; Risk                 Factors:Dyslipidemia. Cardiomyopathy, Hypothyroidism.  Sonographer:    Darlina Sicilian RDCS Referring Phys: [id number] Jonnie Finner  Sonographer Comments: Global longitudinal strain was attempted. IMPRESSIONS  1. Left ventricular ejection fraction, by estimation, is 35 to 40%. The left ventricle has moderately decreased function. The left ventricle demonstrates regional wall motion abnormalities. Abnormal (paradoxical) septal motion, consistent with left bundle branch block. Inferior hypokinesis. There is moderate asymmetric left ventricular hypertrophy of the basal-septal segment. Left ventricular diastolic parameters are consistent with Grade II diastolic dysfunction (pseudonormalization). Elevated left atrial pressure.  2. Right ventricular systolic function is normal. The right ventricular size is normal. There is normal pulmonary artery systolic pressure. The estimated right ventricular systolic pressure  is 28.0 mmHg.  3. The mitral valve is abnormal. Mild mitral valve regurgitation. Moderate mitral annular calcification.  4. The aortic valve was not well visualized. Aortic valve regurgitation is mild to moderate. Mild to moderate aortic valve sclerosis/calcification is present, without any evidence of aortic stenosis.  5. The inferior vena cava is normal in size with greater than 50% respiratory variability, suggesting right atrial pressure of 3 mmHg. FINDINGS  Left Ventricle: Left ventricular ejection fraction, by estimation, is 35 to 40%. The left ventricle has moderately decreased function. The left ventricle demonstrates regional wall motion abnormalities. The left ventricular internal cavity size was normal in size. There is moderate asymmetric left ventricular hypertrophy of the basal-septal segment. Abnormal (paradoxical) septal motion,  consistent with left bundle branch block. Left ventricular diastolic parameters are consistent with Grade II diastolic dysfunction (pseudonormalization). Elevated left atrial pressure. Right Ventricle: The right ventricular size is normal. Right vetricular wall thickness was not assessed. Right ventricular systolic function is normal. There is normal pulmonary artery systolic pressure. The tricuspid regurgitant velocity is 2.50 m/s, and with an assumed right atrial pressure of 3 mmHg, the estimated right ventricular systolic pressure is 33.0 mmHg. Left Atrium: Left atrial size was normal in size. Right Atrium: Right atrial size was normal in size. Pericardium: There is no evidence of pericardial effusion. Mitral Valve: The mitral valve is abnormal. Moderate mitral annular calcification. Mild mitral valve regurgitation. Tricuspid Valve: The tricuspid valve is normal in structure. Tricuspid valve regurgitation is mild. Aortic Valve: The aortic valve was not well visualized. Aortic valve regurgitation is mild to moderate. Aortic regurgitation PHT measures 352 msec. Mild to moderate aortic valve sclerosis/calcification is present, without any evidence of aortic stenosis. Pulmonic Valve: The pulmonic valve was not well visualized. Pulmonic valve regurgitation is trivial. Aorta: The aortic root and ascending aorta are structurally normal, with no evidence of dilitation. Venous: The inferior vena cava is normal in size with greater than 50% respiratory variability, suggesting right atrial pressure of 3 mmHg. IAS/Shunts: The interatrial septum was not well visualized.  LEFT VENTRICLE PLAX 2D LVIDd:         3.60 cm      Diastology LVIDs:         3.00 cm      LV e' medial:    4.13 cm/s LV PW:         1.10 cm      LV E/e' medial:  17.4 LV IVS:        1.30 cm      LV e' lateral:   5.98 cm/s LVOT diam:     2.00 cm      LV E/e' lateral: 12.0 LV SV:         52 LV SV Index:   32 LVOT Area:     3.14 cm  LV Volumes (MOD) LV vol d,  MOD A2C: 118.0 ml LV vol d, MOD A4C: 117.0 ml LV vol s, MOD A2C: 81.1 ml LV vol s, MOD A4C: 83.9 ml LV SV MOD A2C:     36.9 ml LV SV MOD A4C:     117.0 ml LV SV MOD BP:      36.2 ml RIGHT VENTRICLE TAPSE (M-mode): 1.0 cm LEFT ATRIUM             Index       RIGHT ATRIUM          Index LA diam:        2.70 cm 1.65  cm/m  RA Area:     8.90 cm LA Vol (A2C):   52.8 ml 32.17 ml/m RA Volume:   15.70 ml 9.57 ml/m LA Vol (A4C):   37.8 ml 23.03 ml/m LA Biplane Vol: 46.9 ml 28.57 ml/m  AORTIC VALVE LVOT Vmax:   103.00 cm/s LVOT Vmean:  69.600 cm/s LVOT VTI:    0.165 m AI PHT:      352 msec  AORTA Ao Root diam: 3.60 cm Ao Asc diam:  3.30 cm MITRAL VALVE                TRICUSPID VALVE MV Area (PHT): 4.91 cm     TR Peak grad:   25.0 mmHg MV Decel Time: 155 msec     TR Vmax:        250.00 cm/s MV E velocity: 71.95 cm/s MV A velocity: 113.50 cm/s  SHUNTS MV E/A ratio:  0.63         Systemic VTI:  0.16 m                             Systemic Diam: 2.00 cm Oswaldo Milian MD Electronically signed by Oswaldo Milian MD Signature Date/Time: 03/21/2020/1:40:14 PM    Final      Assessment and Plan:   Unstable angina with neg troponin,  May be pericarditis but with known CAD and tight LAD. Will proceed with cardiac cath today if able.   CAD with prior stents to RCA and known LAD siesease of 60-70% stenosis mLAD in 2019  Echo with similar EF.   NICM with last EF 40-45% has been euvolemic on exams.  HLD on statin continue  Hx back surgery so no more tennis but is active.       HEAR Score (for undifferentiated chest pain):    7           For questions or updates, please contact [place] Please consult [email or web address] for contact info under    Signed, [name], NP  03/21/2020 2:24 PM  Patient examined chart reviewed Discussed care with patient , daughter and NP Exam with delightful elderly female. Some dyspnea despite morphine with pleuritic pain. Lungs clear no rub no murmur Abdomen soft  no edema palpable femorals with no bruit and good right radial She had no issues with her first two COVID shots had booster Tuesday and started feeling ill Exam and echo not consistent with pericarditis. Will check ESR/CRP. I reviewed her cath from 2019 and she had tight mid LAD with extensive calcification. Even though her presentation is a bit atypical and temporally related to booster I think she should have cath to r/o progression of known LAD disease. Echo shows EF 35-40% no new RWMA;s. ECG has chronic LBBB so is unrevealing K supplemented will try to repeat before cath. Risks including stroke, contrast allergy bleeding and need for emergency surgery discussed willing to proceed She has only had a little diet coke all day. If intervention needed may need to stage from leg given end of day and complexity needing atherectomy of smaller vessel in elderly female.   Jenkins Rouge MD [facility]

## 2020-03-21 NOTE — Progress Notes (Signed)
ANTICOAGULATION CONSULT NOTE - Initial Consult  Pharmacy Consult for heparin Indication: ACS  No Known Allergies  Patient Measurements: Height: 5\' 3"  (160 cm) Weight: 61.7 kg (136 lb) IBW/kg (Calculated) : 52.4 Heparin Dosing Weight: 61.7kg  Vital Signs: Temp: 98.5 F (36.9 C) (11/17 2303) Temp Source: Oral (11/17 2303) BP: 140/77 (11/18 0145) Pulse Rate: 101 (11/18 0145)  Labs: Recent Labs    03/21/20 0039 03/21/20 0223  HGB 13.7 10.5*  HCT 41.6 31.0*  PLT 210  --   CREATININE  --  0.40*  TROPONINIHS 10  --     Estimated Creatinine Clearance: 43.3 mL/min (A) (by C-G formula based on SCr of 0.4 mg/dL (L)).   Medical History: Past Medical History:  Diagnosis Date  . Coronary artery disease    POST PTCA AND STENTING OF HER RIGHT CORONARY ARTERY  . Hypothyroidism   . LAD stenosis    MODERATE 50-60% STENOSIS  . Osteoporosis      Assessment: 86 yo female received her booster shot on Monday morning and yesterday she started to feel some symptoms, she can't tell me exactly what but just says she knows her breathing isn't right and she can't get comfortable to sleep.  Pharmacy consulted to dose heparin for ACS.  No prior AC noted  03/21/2020  Hgb 10.5 Plts WNL   Goal of Therapy:  Heparin level 0.3-0.7 units/ml Monitor platelets by anticoagulation protocol   Plan:  Heparin bolus 3000 units then heparin drip 700 units/hr heparin level in 8 hours Daily CBC   Dolly Rias RPh 03/21/2020, 2:32 AM

## 2020-03-21 NOTE — Progress Notes (Signed)
TR BAND REMOVAL  LOCATION:    Radial rt radial arterial site  DEFLATED PER PROTOCOL:   yes  TIME BAND OFF / DRESSING APPLIED:    1830/gauze and tegaderm  SITE UPON ARRIVAL:    Level 0  SITE AFTER BAND REMOVAL:    Level 0 w/faint bruising just distal to where band was and at puncture site  CIRCULATION SENSATION AND MOVEMENT:    Within Normal Limits : yes, palpable rt radial, rt hand and fingers warm and pink, sensation present  COMMENTS:   Instructions reviewed w/patient

## 2020-03-21 NOTE — Interval H&P Note (Signed)
Cath Lab Visit (complete for each Cath Lab visit)  Clinical Evaluation Leading to the Procedure:   ACS: Yes.    Non-ACS:    Anginal Classification: CCS II  Anti-ischemic medical therapy: Minimal Therapy (1 class of medications)  Non-Invasive Test Results: No non-invasive testing performed  Prior CABG: No previous CABG      History and Physical Interval Note:  03/21/2020 4:12 PM  Susan [name]  has presented today for surgery, with the diagnosis of chest pain.  The various methods of treatment have been discussed with the patient and family. After consideration of risks, benefits and other options for treatment, the patient has consented to  Procedure(s): LEFT HEART CATH AND CORONARY ANGIOGRAPHY (N/A) as a surgical intervention.  The patient's history has been reviewed, patient examined, no change in status, stable for surgery.  I have reviewed the patient's chart and labs.  Questions were answered to the patient's satisfaction.     Quay Burow

## 2020-03-21 NOTE — ED Notes (Signed)
Spoke

## 2020-03-21 NOTE — H&P (Signed)
History and Physical    TONI DEMO CVE:[id number] DOB: July 18, 1933 DOA: 03/20/2020  PCP: Crist Infante, MD  Patient coming from: Home  Chief Complaint: Chest pressure  HPI: Susan [name] is a 86 y.o. female with medical history significant of CAD w/ stents, HLD, hypothyroidism. Presenting with 1 day of severe (9 to10/10) midsternal chest pain. She reports yesterday afternoon, she started having difficulty breathing. She wasn't doing anything in particular. She tried to rest and realized that she was having chest heaviness/pressure as if "something were weighing me down." Midsternal, nonradiating, heaviness that is constant even at rest. She does not know of any aggravating factors or alleviating factors. She did not try any treatment at home. She came to the ED out of concern for an MI.   Of note, she got a J&J COVID booster on Monday. She states that the next day she didn't feel so great (fatigue, weakness); but denies CP at that time.     ED Course: She was given ASA and 3 NTG. Her symptoms improved some, but have not completely cleared. CTA PE was negative for PE, PNA. EKG showed LBBB, Trp are negative x 2. She was started on heparin gtt by EDP as precaution. TRH was called for admission.   Review of Systems:  Reports midsternal chest pain, dyspnea. Denies palpitations, syncopal episodes, abdominal pain, indigestion, N/V, lightheadedness. Review of systems is otherwise negative for all not mentioned in HPI.   PMHx Past Medical History:  Diagnosis Date   Coronary artery disease    POST PTCA AND STENTING OF HER RIGHT CORONARY ARTERY   Hypothyroidism    LAD stenosis    MODERATE 50-60% STENOSIS   Osteoporosis     PSHx Past Surgical History:  Procedure Laterality Date   APPENDECTOMY  1970s?   "ruptured"    BACK SURGERY     CATARACT EXTRACTION W/ INTRAOCULAR LENS  IMPLANT, BILATERAL Bilateral    CORONARY ANGIOPLASTY WITH STENT PLACEMENT  ~ 2009   "2 stents"    FRACTURE SURGERY     IR VERTEBROPLASTY CERV/THOR BX INC UNI/BIL INC/INJECT/IMAGING  02/07/2018   KNEE ARTHROSCOPY Right     torn meniscus   LUMBAR LAMINECTOMY  1978   OVARIAN CYST SURGERY  1970s?   "ruptured"    RIGHT/LEFT HEART CATH AND CORONARY ANGIOGRAPHY N/A 10/08/2017   Procedure: RIGHT/LEFT HEART CATH AND CORONARY ANGIOGRAPHY;  Surgeon: Nelva Bush, MD;  Location: [facility];  Service: Cardiovascular;  Laterality: N/A;   WRIST FRACTURE SURGERY Bilateral    "2 surgeries for 2 breaks on left side; 1 surgery for 1 break on right wrist"    SocHx  reports that she has never smoked. She has never used smokeless tobacco. She reports current alcohol use of about 1.0 standard drink of alcohol per week. She reports that she does not use drugs.  No Known Allergies  FamHx Family History  Problem Relation Age of Onset   Alzheimer's disease Mother    Heart attack Father    Aneurysm Sister     Prior to Admission medications   Medication Sig Start Date End Date Taking? Authorizing Provider  acetaminophen (TYLENOL) 325 MG tablet Take 2 tablets (650 mg total) by mouth every 6 (six) hours. Patient taking differently: Take 650 mg by mouth every 6 (six) hours as needed for mild pain.  11/14/18  Yes Johnson, Clanford L, MD  Calcium Carbonate-Vitamin D (CALCIUM + D PO) Take 2 tablets by mouth at bedtime.  Yes [provider]  carvedilol (COREG) 3.125 MG tablet Take 3.125 mg by mouth daily.   Yes [provider]  cholecalciferol (VITAMIN D3) 25 MCG (1000 UT) tablet Take 1,000 Units by mouth daily after breakfast.   Yes [provider]  clopidogrel (PLAVIX) 75 MG tablet Take 1 tablet by mouth once daily 01/19/20  Yes Nahser, Wonda Cheng, MD  levothyroxine (SYNTHROID, LEVOTHROID) 75 MCG tablet Take 75 mcg by mouth daily before breakfast.  11/30/17  Yes [provider]  multivitamin-lutein (OCUVITE-LUTEIN) CAPS capsule Take 1 capsule by mouth 2 (two)  times a day.    Yes [provider]  nitroGLYCERIN (NITROSTAT) 0.4 MG SL tablet Place 1 tablet (0.4 mg total) under the tongue every 5 (five) minutes as needed. 06/22/11  Yes Nahser, Wonda Cheng, MD  Omega-3 Fatty Acids (FISH OIL) 1200 MG CAPS Take 1,200 mg by mouth at bedtime.   Yes [provider]  rosuvastatin (CRESTOR) 40 MG tablet Take 1 tablet (40 mg total) by mouth daily. 02/14/20  Yes Nahser, Wonda Cheng, MD  venlafaxine XR (EFFEXOR-XR) 150 MG 24 hr capsule Take 1 capsule by mouth once daily 01/30/20  Yes Nahser, Wonda Cheng, MD  vitamin B-12 (CYANOCOBALAMIN) 1000 MCG tablet Take 2,000 mcg by mouth daily.    Yes [provider]    Physical Exam: Vitals:   03/21/20 0145 03/21/20 0300 03/21/20 0500 03/21/20 0630  BP: 140/77 122/72 100/63 114/60  Pulse: (!) 101 100 87 87  Resp: (!) 26 (!) 27 (!) 27 (!) 31  Temp:      TempSrc:      SpO2: 95% 91% 96% 95%  Weight:      Height:        General: 86 y.o. female resting in bed in NAD Eyes: PERRL, normal sclera ENMT: Nares patent w/o discharge, orophaynx clear, dentition normal, ears w/o discharge/lesions/ulcers Neck: Supple, trachea midline Cardiovascular: RRR, +S1, S2, no m/g/r, equal pulses throughout Respiratory: CTABL, no w/r/r, normal WOB GI: BS+, NDNT, no masses noted, no organomegaly noted MSK: No e/c/c Skin: No rashes, bruises, ulcerations noted Neuro: A&O x 3, no focal deficits Psyc: Appropriate interaction and affect, calm/cooperative  Labs on Admission: I have personally reviewed following labs and imaging studies  CBC: Recent Labs  Lab 03/21/20 0039 03/21/20 0223  WBC 10.1  --   NEUTROABS 6.9  --   HGB 13.7 10.5*  HCT 41.6 31.0*  MCV 99.0  --   PLT 210  --    Basic Metabolic Panel: Recent Labs  Lab 03/21/20 0223  NA 144  K 2.5*  CL 114*  GLUCOSE 82  BUN 11  CREATININE 0.40*   GFR: Estimated Creatinine Clearance: 43.3 mL/min (A) (by C-G formula based on SCr of 0.4 mg/dL (L)). Liver  Function Tests: No results for input(s): AST, ALT, ALKPHOS, BILITOT, PROT, ALBUMIN in the last 168 hours. No results for input(s): LIPASE, AMYLASE in the last 168 hours. No results for input(s): AMMONIA in the last 168 hours. Coagulation Profile: No results for input(s): INR, PROTIME in the last 168 hours. Cardiac Enzymes: No results for input(s): CKTOTAL, CKMB, CKMBINDEX, TROPONINI in the last 168 hours. BNP (last 3 results) No results for input(s): PROBNP in the last 8760 hours. HbA1C: No results for input(s): HGBA1C in the last 72 hours. CBG: No results for input(s): GLUCAP in the last 168 hours. Lipid Profile: No results for input(s): CHOL, HDL, LDLCALC, TRIG, CHOLHDL, LDLDIRECT in the last 72 hours. Thyroid Function Tests:  No results for input(s): TSH, T4TOTAL, FREET4, T3FREE, THYROIDAB in the last 72 hours. Anemia Panel: No results for input(s): VITAMINB12, FOLATE, FERRITIN, TIBC, IRON, RETICCTPCT in the last 72 hours. Urine analysis:    Component Value Date/Time   COLORURINE YELLOW 11/12/2018 2019   APPEARANCEUR CLEAR 11/12/2018 2019   LABSPEC 1.011 11/12/2018 2019   PHURINE 7.0 11/12/2018 2019   GLUCOSEU NEGATIVE 11/12/2018 2019   HGBUR NEGATIVE 11/12/2018 2019   BILIRUBINUR NEGATIVE 11/12/2018 2019   KETONESUR NEGATIVE 11/12/2018 2019   PROTEINUR NEGATIVE 11/12/2018 2019   UROBILINOGEN 0.2 07/13/2011 2036   NITRITE NEGATIVE 11/12/2018 2019   LEUKOCYTESUR SMALL (A) 11/12/2018 2019    Radiological Exams on Admission: DG Chest 2 View  Result Date: 03/21/2020 CLINICAL DATA:  86 year old female with shortness of breath following recent COVID-19 booster. EXAM: CHEST - 2 VIEW COMPARISON:  Chest radiographs 02/04/2018 and earlier. FINDINGS: Stable lung volumes and mediastinal contours since 2019. Mild cardiomegaly. Visualized tracheal air column is within normal limits. No pneumothorax, pulmonary edema, pleural effusion or confluent pulmonary opacity. Upper thoracic  vertebral augmentation since the prior study. Abdominal Calcified aortic atherosclerosis. Negative visible bowel gas pattern. IMPRESSION: 1. No acute cardiopulmonary abnormality. 2. Mild cardiomegaly.  Aortic Atherosclerosis (ICD10-I70.0). A written preliminary report of this exam during a planned IT downtime was issued by Dr. Jesse Fall at 2353 hours. Electronically Signed   By: Genevie Ann M.D.   On: 03/21/2020 01:51   CT Angio Chest PE W and/or Wo Contrast  Result Date: 03/21/2020 CLINICAL DATA:  Chest pain, dyspnea EXAM: CT ANGIOGRAPHY CHEST WITH CONTRAST TECHNIQUE: Multidetector CT imaging of the chest was performed using the standard protocol during bolus administration of intravenous contrast. Multiplanar CT image reconstructions and MIPs were obtained to evaluate the vascular anatomy. CONTRAST:  54mL OMNIPAQUE IOHEXOL 350 MG/ML SOLN COMPARISON:  None. FINDINGS: Cardiovascular: There is adequate opacification of the pulmonary arterial tree through the segmental level. There is no intraluminal filling defect to suggest acute pulmonary embolism. The central pulmonary arteries are enlarged in keeping with changes of pulmonary arterial hypertension. There is extensive multi-vessel coronary artery calcification, grossly stable since prior examination. There is mild global cardiomegaly with left ventricular dilation noted. No pericardial effusion. The thoracic aortic arch is dilated measuring 3.2 cm in maximal diameter and measuring 2.9 cm in diameter at the level of the left atrium. Mild atherosclerotic calcification is seen within the thoracic aorta. Mediastinum/Nodes: No enlarged mediastinal, hilar, or axillary lymph nodes. Thyroid gland, trachea, and esophagus demonstrate no significant findings. Lungs/Pleura: There is mild bibasilar atelectasis or scarring. The lungs are otherwise clear. No pneumothorax or pleural effusion. Central airways are widely patent. Upper Abdomen: No acute abnormality. Musculoskeletal:  The osseous structures are diffusely osteopenic. There is progressive loss of height of a T6 compression fracture when compared to prior examination of 01/31/2018 with mild focal kyphosis at this level as a result. Multiple healed right rib fractures are noted. Remote fracture deformity of the right clavicle is partially visualized. No acute bone abnormality. Review of the MIP images confirms the above findings. IMPRESSION: No pulmonary embolism. Extensive multi-vessel coronary artery calcification. Left ventricular dilation, progressive since prior examination. Central pulmonary artery enlargement in keeping with pulmonary arterial hypertension. Dilation of the thoracic aortic arch measuring 3.2 cm. Recommend annual imaging followup by CTA or MRA. This recommendation follows 2010 ACCF/AHA/AATS/ACR/ASA/SCA/SCAI/SIR/STS/SVM Guidelines for the Diagnosis and Management of Patients with Thoracic Aortic Disease. Circulation.2010; 121: U132-G401. Aortic aneurysm NOS (ICD10-I71.9) Aortic aneurysm NOS (ICD10-I71.9). Electronically Signed  By: Fidela Salisbury MD   On: 03/21/2020 04:31    EKG: Independently reviewed. Sinus, LBBB  Assessment/Plan Chest pain Unstable angina?     - admit to obs, telemetry     - CTA PE negative for PE     - trp neg x 2     - Check echo     - consulted cardiology     - TIMI is 3/4     - hold heparin gtt given heart w/u negative at this point; will place on SQ heparin for DVT PPx  Hypokalemia     - replace K+, check Mg2+  Macrocytic anemia     - no evidence of bleed     - check B12, folate  Anxiety/Depression     - continue effexor  Hypothyroidism     - continue levothyroxine   DVT prophylaxis: heaprin  Code Status: DNI, confirmed at bedside w/ patient  Family Communication: None at bedside.  Consults called: Cardiology  Status is: Observation  The patient remains OBS appropriate and will d/c before 2 midnights.  Dispo: The patient is from: Home               Anticipated d/c is to: Home              Anticipated d/c date is: 1 day              Patient currently is not medically stable to d/c.  Jonnie Finner DO [facility]  If 7PM-7AM, please contact night-coverage [email or web address]  03/21/2020, 7:17 AM

## 2020-03-21 NOTE — Progress Notes (Signed)
°  Echocardiogram 2D Echocardiogram with 3D has been performed.  Darlina Sicilian M 03/21/2020, 11:01 AM

## 2020-03-21 NOTE — Consult Note (Addendum)
Cardiology Consultation:   Patient ID: Susan [name] MRN: [id number]; DOB: 04-Mar-1934  Admit date: 03/20/2020 Date of Consult: 03/21/2020  Primary Care Provider: Crist Infante, MD [facility] Cardiologist: Mertie Moores, MD  [place] Electrophysiologist:  None    Patient Profile:   Susan [name] is a 86 y.o. female with a hx of CAD hx stent to RCA  2005, also mod prox LAD stenosis, hypothyroid, HLD, decreased EF 40-45 %, LBBB,  who is being seen today for the evaluation of chest pressure at the request of Dr. Marylyn Ishihara.  History of Present Illness:   Ms. Marks with above hx and last cath 10/2017 with mod to severe mLAD stenosis 60-70%, increased form 2012, mid and distal RCA stents patent. Non obstructive LCX and RCA disease.   NICM with EF 25-30%.  meds adjusted and last echo 2020 EF up to 40-45% , impaired LV relaxation.  Septal dyssynergy due to LBBB.  She has been active playing tennis until 2-3 years ago. Still active walks for exercise.Marland Kitchen    Pt presented to ER last evening with chest pain and SOB.  She has been vaccinated with J&J and booster.   In ER she was given ASA and 3 NTG,  CTA neg for PE, no PNA. Dilation of the thoracic aortic arch measuring 3.2 cm and extensive multivessel CAD    IV heparin was started.    EKG:  The EKG was personally reviewed and demonstrates:  SR at 95 and LBBB no change from 06/15/19  Telemetry:  Telemetry was personally reviewed and demonstrates:  SR  Hs troponin 10, 9  BNP 282 Na 144, K+ 2.5 Cr 0.40 golate 11 B12 886 WBC 11.2 Hgb was 13.7 now on heparin 10.3 plts 166  Neg covid Echo pending   BP 97/59 P 76 afebrile  Still with pain she has needed pain meds    Past Medical History:  Diagnosis Date   Coronary artery disease    POST PTCA AND STENTING OF HER RIGHT CORONARY ARTERY   Hypothyroidism    LAD stenosis    MODERATE 50-60% STENOSIS   Osteoporosis     Past Surgical History:  Procedure Laterality Date   APPENDECTOMY   1970s?   "ruptured"    BACK SURGERY     CATARACT EXTRACTION W/ INTRAOCULAR LENS  IMPLANT, BILATERAL Bilateral    CORONARY ANGIOPLASTY WITH STENT PLACEMENT  ~ 2009   "2 stents"   FRACTURE SURGERY     IR VERTEBROPLASTY CERV/THOR BX INC UNI/BIL INC/INJECT/IMAGING  02/07/2018   KNEE ARTHROSCOPY Right     torn meniscus   LUMBAR LAMINECTOMY  1978   OVARIAN CYST SURGERY  1970s?   "ruptured"    RIGHT/LEFT HEART CATH AND CORONARY ANGIOGRAPHY N/A 10/08/2017   Procedure: RIGHT/LEFT HEART CATH AND CORONARY ANGIOGRAPHY;  Surgeon: Nelva Bush, MD;  Location: [facility];  Service: Cardiovascular;  Laterality: N/A;   WRIST FRACTURE SURGERY Bilateral    "2 surgeries for 2 breaks on left side; 1 surgery for 1 break on right wrist"     Home Medications:  Prior to Admission medications   Medication Sig Start Date End Date Taking? Authorizing Provider  acetaminophen (TYLENOL) 325 MG tablet Take 2 tablets (650 mg total) by mouth every 6 (six) hours. Patient taking differently: Take 650 mg by mouth every 6 (six) hours as needed for mild pain.  11/14/18  Yes Johnson, Clanford L, MD  Calcium Carbonate-Vitamin D (CALCIUM + D PO) Take 2 tablets by  mouth at bedtime.    Yes [provider]  carvedilol (COREG) 3.125 MG tablet Take 3.125 mg by mouth daily.   Yes [provider]  cholecalciferol (VITAMIN D3) 25 MCG (1000 UT) tablet Take 1,000 Units by mouth daily after breakfast.   Yes [provider]  clopidogrel (PLAVIX) 75 MG tablet Take 1 tablet by mouth once daily 01/19/20  Yes Nahser, Wonda Cheng, MD  levothyroxine (SYNTHROID, LEVOTHROID) 75 MCG tablet Take 75 mcg by mouth daily before breakfast.  11/30/17  Yes [provider]  multivitamin-lutein (OCUVITE-LUTEIN) CAPS capsule Take 1 capsule by mouth 2 (two) times a day.    Yes [provider]  nitroGLYCERIN (NITROSTAT) 0.4 MG SL tablet Place 1 tablet (0.4 mg total) under the tongue every 5 (five) minutes as  needed. 06/22/11  Yes Nahser, Wonda Cheng, MD  Omega-3 Fatty Acids (FISH OIL) 1200 MG CAPS Take 1,200 mg by mouth at bedtime.   Yes [provider]  rosuvastatin (CRESTOR) 40 MG tablet Take 1 tablet (40 mg total) by mouth daily. 02/14/20  Yes Nahser, Wonda Cheng, MD  venlafaxine XR (EFFEXOR-XR) 150 MG 24 hr capsule Take 1 capsule by mouth once daily 01/30/20  Yes Nahser, Wonda Cheng, MD  vitamin B-12 (CYANOCOBALAMIN) 1000 MCG tablet Take 2,000 mcg by mouth daily.    Yes [provider]    Inpatient Medications: Scheduled Meds:  carvedilol  3.125 mg Oral Daily   [START ON 03/22/2020] cholecalciferol  1,000 Units Oral QPC breakfast   clopidogrel  75 mg Oral Daily   heparin  5,000 Units Subcutaneous Q8H   heparin injection (subcutaneous)  5,000 Units Subcutaneous Q8H   [START ON 03/22/2020] levothyroxine  75 mcg Oral QAC breakfast   multivitamin  1 tablet Oral BID   omega-3 acid ethyl esters  1 g Oral QHS   rosuvastatin  40 mg Oral Daily   venlafaxine XR  150 mg Oral Daily   vitamin B-12  2,000 mcg Oral Daily   Continuous Infusions:   PRN Meds: acetaminophen, ALPRAZolam, morphine injection, ondansetron (ZOFRAN) IV, zolpidem  Allergies:   No Known Allergies  Social History:   Social History   Socioeconomic History   Marital status: Widowed    Spouse name: Not on file   Number of children: Not on file   Years of education: Not on file   Highest education level: Not on file  Occupational History   Not on file  Tobacco Use   Smoking status: Never Smoker   Smokeless tobacco: Never Used  Vaping Use   Vaping Use: Never used  Substance and Sexual Activity   Alcohol use: Yes    Alcohol/week: 1.0 standard drink    Types: 1 Glasses of wine per week   Drug use: Never   Sexual activity: Not on file  Other Topics Concern   Not on file  Social History Narrative   Not on file   Social Determinants of Health   Financial Resource Strain:    Difficulty of Paying Living  Expenses: Not on file  Food Insecurity:    Worried About [place] in the Last Year: Not on file   Ran Out of Food in the Last Year: Not on file  Transportation Needs:    Lack of Transportation (Medical): Not on file   Lack of Transportation (Non-Medical): Not on file  Physical Activity:    Days of Exercise per Week: Not on file   Minutes of Exercise per Session: Not  on file  Stress:    Feeling of Stress : Not on file  Social Connections:    Frequency of Communication with Friends and Family: Not on file   Frequency of Social Gatherings with Friends and Family: Not on file   Attends Religious Services: Not on file   Active Member of Clubs or Organizations: Not on file   Attends Archivist Meetings: Not on file   Marital Status: Not on file  Intimate Partner Violence:    Fear of Current or Ex-Partner: Not on file   Emotionally Abused: Not on file   Physically Abused: Not on file   Sexually Abused: Not on file    Family History:    Family History  Problem Relation Age of Onset   Alzheimer's disease Mother    Heart attack Father    Aneurysm Sister      ROS:  Please see the history of present illness.  General:no colds or fevers, no weight changes Skin:no rashes or ulcers HEENT:no blurred vision, no congestion CV:see HPI PUL:see HPI GI:no diarrhea constipation or melena, no indigestion GU:no hematuria, no dysuria MS:no joint pain, no claudication Neuro:no syncope, no lightheadedness Endo:no diabetes, + thyroid disease  All other ROS reviewed and negative.     Physical Exam/Data:   Vitals:   03/21/20 1224 03/21/20 1230 03/21/20 1348 03/21/20 1418  BP: (!) 94/57 110/68 100/64 105/67  Pulse: 89 87 86 84  Resp: 18 (!) 28 (!) 27   Temp:      TempSrc:      SpO2: 97% 95% 93%   Weight:      Height:       No intake or output data in the 24 hours ending 03/21/20 1424 Last 3 Weights 03/20/2020 06/15/2019 12/12/2018  Weight (lbs) 136 lb 136 lb 1.9 oz  134 lb 1.9 oz  Weight (kg) 61.689 kg 61.744 kg 60.836 kg     Body mass index is 24.09 kg/m.  General:  Well nourished, well developed, in no acute distress though painful with deep breath mild SOB HEENT: normal Lymph: no adenopathy Neck: no JVD Endocrine:  No thryomegaly Vascular: No carotid bruits; peal pulses 2+ bilaterally  Cardiac:  normal S1, S2; RRR; no murmur gallup rub or click Lungs:  clear to auscultation bilaterally, no wheezing, rhonchi or rales  Abd: soft, nontender, no hepatomegaly  Ext: no edema Musculoskeletal:  No deformities, BUE and BLE strength normal and equal Skin: warm and dry  Neuro:  Alert and oriented X 3 MAE follows commands, no focal abnormalities noted Psych:  Normal affect    Relevant CV Studies: Echo  IMPRESSIONS     1. Left ventricular ejection fraction, by estimation, is 35 to 40%. The  left ventricle has moderately decreased function. The left ventricle  demonstrates regional wall motion abnormalities. Abnormal (paradoxical)  septal motion, consistent with left  bundle branch block. Inferior hypokinesis. There is moderate asymmetric  left ventricular hypertrophy of the basal-septal segment. Left ventricular  diastolic parameters are consistent with Grade II diastolic dysfunction  (pseudonormalization). Elevated  left atrial pressure.   2. Right ventricular systolic function is normal. The right ventricular  size is normal. There is normal pulmonary artery systolic pressure. The  estimated right ventricular systolic pressure is 16.1 mmHg.   3. The mitral valve is abnormal. Mild mitral valve regurgitation.  Moderate mitral annular calcification.   4. The aortic valve was not well visualized. Aortic valve regurgitation  is mild to moderate. Mild to moderate  aortic valve sclerosis/calcification  is present, without any evidence of aortic stenosis.   5. The inferior vena cava is normal in size with greater than 50%  respiratory variability,  suggesting right atrial pressure of 3 mmHg.   FINDINGS   Left Ventricle: Left ventricular ejection fraction, by estimation, is 35  to 40%. The left ventricle has moderately decreased function. The left  ventricle demonstrates regional wall motion abnormalities. The left  ventricular internal cavity size was  normal in size. There is moderate asymmetric left ventricular hypertrophy  of the basal-septal segment. Abnormal (paradoxical) septal motion,  consistent with left bundle branch block. Left ventricular diastolic  parameters are consistent with Grade II  diastolic dysfunction (pseudonormalization). Elevated left atrial  pressure.   Right Ventricle: The right ventricular size is normal. Right vetricular  wall thickness was not assessed. Right ventricular systolic function is  normal. There is normal pulmonary artery systolic pressure. The tricuspid  regurgitant velocity is 2.50 m/s,  and with an assumed right atrial pressure of 3 mmHg, the estimated right  ventricular systolic pressure is 61.6 mmHg.   Laboratory Data:  High Sensitivity Troponin:   Recent Labs  Lab 03/21/20 0039 03/21/20 0239  TROPONINIHS 10 9     Chemistry Recent Labs  Lab 03/21/20 0223  NA 144  K 2.5*  CL 114*  GLUCOSE 82  BUN 11  CREATININE 0.40*    No results for input(s): PROT, ALBUMIN, AST, ALT, ALKPHOS, BILITOT in the last 168 hours. Hematology Recent Labs  Lab 03/21/20 0039 03/21/20 0223 03/21/20 0500  WBC 10.1  --  11.2*  RBC 4.20  --  3.18*  HGB 13.7 10.5* 10.3*  HCT 41.6 31.0* 31.9*  MCV 99.0  --  100.3*  MCH 32.6  --  32.4  MCHC 32.9  --  32.3  RDW 13.6  --  13.8  PLT 210  --  166   BNP Recent Labs  Lab 03/21/20 0856  BNP 282.7*    DDimer No results for input(s): DDIMER in the last 168 hours.   Radiology/Studies:  DG Chest 2 View  Result Date: 03/21/2020 CLINICAL DATA:  86 year old female with shortness of breath following recent COVID-19 booster. EXAM: CHEST - 2  VIEW COMPARISON:  Chest radiographs 02/04/2018 and earlier. FINDINGS: Stable lung volumes and mediastinal contours since 2019. Mild cardiomegaly. Visualized tracheal air column is within normal limits. No pneumothorax, pulmonary edema, pleural effusion or confluent pulmonary opacity. Upper thoracic vertebral augmentation since the prior study. Abdominal Calcified aortic atherosclerosis. Negative visible bowel gas pattern. IMPRESSION: 1. No acute cardiopulmonary abnormality. 2. Mild cardiomegaly.  Aortic Atherosclerosis (ICD10-I70.0). A written preliminary report of this exam during a planned IT downtime was issued by Dr. Jesse Fall at 2353 hours. Electronically Signed   By: Genevie Ann M.D.   On: 03/21/2020 01:51   CT Angio Chest PE W and/or Wo Contrast  Result Date: 03/21/2020 CLINICAL DATA:  Chest pain, dyspnea EXAM: CT ANGIOGRAPHY CHEST WITH CONTRAST TECHNIQUE: Multidetector CT imaging of the chest was performed using the standard protocol during bolus administration of intravenous contrast. Multiplanar CT image reconstructions and MIPs were obtained to evaluate the vascular anatomy. CONTRAST:  97m OMNIPAQUE IOHEXOL 350 MG/ML SOLN COMPARISON:  None. FINDINGS: Cardiovascular: There is adequate opacification of the pulmonary arterial tree through the segmental level. There is no intraluminal filling defect to suggest acute pulmonary embolism. The central pulmonary arteries are enlarged in keeping with changes of pulmonary arterial hypertension. There is extensive multi-vessel coronary artery calcification,  grossly stable since prior examination. There is mild global cardiomegaly with left ventricular dilation noted. No pericardial effusion. The thoracic aortic arch is dilated measuring 3.2 cm in maximal diameter and measuring 2.9 cm in diameter at the level of the left atrium. Mild atherosclerotic calcification is seen within the thoracic aorta. Mediastinum/Nodes: No enlarged mediastinal, hilar, or axillary lymph  nodes. Thyroid gland, trachea, and esophagus demonstrate no significant findings. Lungs/Pleura: There is mild bibasilar atelectasis or scarring. The lungs are otherwise clear. No pneumothorax or pleural effusion. Central airways are widely patent. Upper Abdomen: No acute abnormality. Musculoskeletal: The osseous structures are diffusely osteopenic. There is progressive loss of height of a T6 compression fracture when compared to prior examination of 01/31/2018 with mild focal kyphosis at this level as a result. Multiple healed right rib fractures are noted. Remote fracture deformity of the right clavicle is partially visualized. No acute bone abnormality. Review of the MIP images confirms the above findings. IMPRESSION: No pulmonary embolism. Extensive multi-vessel coronary artery calcification. Left ventricular dilation, progressive since prior examination. Central pulmonary artery enlargement in keeping with pulmonary arterial hypertension. Dilation of the thoracic aortic arch measuring 3.2 cm. Recommend annual imaging followup by CTA or MRA. This recommendation follows 2010 ACCF/AHA/AATS/ACR/ASA/SCA/SCAI/SIR/STS/SVM Guidelines for the Diagnosis and Management of Patients with Thoracic Aortic Disease. Circulation.2010; 121: F643-P295. Aortic aneurysm NOS (ICD10-I71.9) Aortic aneurysm NOS (ICD10-I71.9). Electronically Signed   By: Fidela Salisbury MD   On: 03/21/2020 04:31   ECHOCARDIOGRAM COMPLETE  Result Date: 03/21/2020    ECHOCARDIOGRAM REPORT   Patient Name:   Susan [name] Date of Exam: 03/21/2020 Medical Rec #:  [id number]        Height:       63.0 in Accession #:    [id number]       Weight:       136.0 lb Date of Birth:  [birth year]         BSA:          1.641 m Patient Age:    42 years         BP:           102/66 mmHg Patient Gender: F                HR:           91 bpm. Exam Location:  Inpatient Procedure: 2D Echo, 3D Echo, Strain Analysis, Color Doppler and Cardiac Doppler Indications:    Chest Pain  786.50 / R07.9  History:        Patient has prior history of Echocardiogram examinations, most                 recent 11/13/2018. CAD, Arrythmias:LBBB; Risk                 Factors:Dyslipidemia. Cardiomyopathy, Hypothyroidism.  Sonographer:    Darlina Sicilian RDCS Referring Phys: [id number] Jonnie Finner  Sonographer Comments: Global longitudinal strain was attempted. IMPRESSIONS  1. Left ventricular ejection fraction, by estimation, is 35 to 40%. The left ventricle has moderately decreased function. The left ventricle demonstrates regional wall motion abnormalities. Abnormal (paradoxical) septal motion, consistent with left bundle branch block. Inferior hypokinesis. There is moderate asymmetric left ventricular hypertrophy of the basal-septal segment. Left ventricular diastolic parameters are consistent with Grade II diastolic dysfunction (pseudonormalization). Elevated left atrial pressure.  2. Right ventricular systolic function is normal. The right ventricular size is normal. There is normal pulmonary artery systolic pressure. The estimated right ventricular systolic pressure  is 28.0 mmHg.  3. The mitral valve is abnormal. Mild mitral valve regurgitation. Moderate mitral annular calcification.  4. The aortic valve was not well visualized. Aortic valve regurgitation is mild to moderate. Mild to moderate aortic valve sclerosis/calcification is present, without any evidence of aortic stenosis.  5. The inferior vena cava is normal in size with greater than 50% respiratory variability, suggesting right atrial pressure of 3 mmHg. FINDINGS  Left Ventricle: Left ventricular ejection fraction, by estimation, is 35 to 40%. The left ventricle has moderately decreased function. The left ventricle demonstrates regional wall motion abnormalities. The left ventricular internal cavity size was normal in size. There is moderate asymmetric left ventricular hypertrophy of the basal-septal segment. Abnormal (paradoxical) septal motion,  consistent with left bundle branch block. Left ventricular diastolic parameters are consistent with Grade II diastolic dysfunction (pseudonormalization). Elevated left atrial pressure. Right Ventricle: The right ventricular size is normal. Right vetricular wall thickness was not assessed. Right ventricular systolic function is normal. There is normal pulmonary artery systolic pressure. The tricuspid regurgitant velocity is 2.50 m/s, and with an assumed right atrial pressure of 3 mmHg, the estimated right ventricular systolic pressure is 33.0 mmHg. Left Atrium: Left atrial size was normal in size. Right Atrium: Right atrial size was normal in size. Pericardium: There is no evidence of pericardial effusion. Mitral Valve: The mitral valve is abnormal. Moderate mitral annular calcification. Mild mitral valve regurgitation. Tricuspid Valve: The tricuspid valve is normal in structure. Tricuspid valve regurgitation is mild. Aortic Valve: The aortic valve was not well visualized. Aortic valve regurgitation is mild to moderate. Aortic regurgitation PHT measures 352 msec. Mild to moderate aortic valve sclerosis/calcification is present, without any evidence of aortic stenosis. Pulmonic Valve: The pulmonic valve was not well visualized. Pulmonic valve regurgitation is trivial. Aorta: The aortic root and ascending aorta are structurally normal, with no evidence of dilitation. Venous: The inferior vena cava is normal in size with greater than 50% respiratory variability, suggesting right atrial pressure of 3 mmHg. IAS/Shunts: The interatrial septum was not well visualized.  LEFT VENTRICLE PLAX 2D LVIDd:         3.60 cm      Diastology LVIDs:         3.00 cm      LV e' medial:    4.13 cm/s LV PW:         1.10 cm      LV E/e' medial:  17.4 LV IVS:        1.30 cm      LV e' lateral:   5.98 cm/s LVOT diam:     2.00 cm      LV E/e' lateral: 12.0 LV SV:         52 LV SV Index:   32 LVOT Area:     3.14 cm  LV Volumes (MOD) LV vol d,  MOD A2C: 118.0 ml LV vol d, MOD A4C: 117.0 ml LV vol s, MOD A2C: 81.1 ml LV vol s, MOD A4C: 83.9 ml LV SV MOD A2C:     36.9 ml LV SV MOD A4C:     117.0 ml LV SV MOD BP:      36.2 ml RIGHT VENTRICLE TAPSE (M-mode): 1.0 cm LEFT ATRIUM             Index       RIGHT ATRIUM          Index LA diam:        2.70 cm 1.65  cm/m  RA Area:     8.90 cm LA Vol (A2C):   52.8 ml 32.17 ml/m RA Volume:   15.70 ml 9.57 ml/m LA Vol (A4C):   37.8 ml 23.03 ml/m LA Biplane Vol: 46.9 ml 28.57 ml/m  AORTIC VALVE LVOT Vmax:   103.00 cm/s LVOT Vmean:  69.600 cm/s LVOT VTI:    0.165 m AI PHT:      352 msec  AORTA Ao Root diam: 3.60 cm Ao Asc diam:  3.30 cm MITRAL VALVE                TRICUSPID VALVE MV Area (PHT): 4.91 cm     TR Peak grad:   25.0 mmHg MV Decel Time: 155 msec     TR Vmax:        250.00 cm/s MV E velocity: 71.95 cm/s MV A velocity: 113.50 cm/s  SHUNTS MV E/A ratio:  0.63         Systemic VTI:  0.16 m                             Systemic Diam: 2.00 cm Oswaldo Milian MD Electronically signed by Oswaldo Milian MD Signature Date/Time: 03/21/2020/1:40:14 PM    Final      Assessment and Plan:   Unstable angina with neg troponin,  May be pericarditis but with known CAD and tight LAD. Will proceed with cardiac cath today if able.   CAD with prior stents to RCA and known LAD siesease of 60-70% stenosis mLAD in 2019  Echo with similar EF.   NICM with last EF 40-45% has been euvolemic on exams.  HLD on statin continue  Hx back surgery so no more tennis but is active.       HEAR Score (for undifferentiated chest pain):    7           For questions or updates, please contact [facility] [place] Please consult [email or web address] for contact info under    Signed, [name], NP  03/21/2020 2:24 PM  Patient examined chart reviewed Discussed care with patient , daughter and NP Exam with delightful elderly female. Some dyspnea despite morphine with pleuritic pain. Lungs clear no rub no murmur Abdomen soft  no edema palpable femorals with no bruit and good right radial She had no issues with her first two COVID shots had booster Tuesday and started feeling ill Exam and echo not consistent with pericarditis. Will check ESR/CRP. I reviewed her cath from 2019 and she had tight mid LAD with extensive calcification. Even though her presentation is a bit atypical and temporally related to booster I think she should have cath to r/o progression of known LAD disease. Echo shows EF 35-40% no new RWMA;s. ECG has chronic LBBB so is unrevealing K supplemented will try to repeat before cath. Risks including stroke, contrast allergy bleeding and need for emergency surgery discussed willing to proceed She has only had a little diet coke all day. If intervention needed may need to stage from leg given end of day and complexity needing atherectomy of smaller vessel in elderly female.   Jenkins Rouge MD [facility]

## 2020-03-21 NOTE — ED Notes (Signed)
Updated family/patient regarding plan of care, admission process

## 2020-03-22 ENCOUNTER — Encounter (HOSPITAL_COMMUNITY): Payer: Self-pay | Admitting: Cardiovascular Disease

## 2020-03-22 DIAGNOSIS — R071 Chest pain on breathing: Secondary | ICD-10-CM | POA: Diagnosis not present

## 2020-03-22 DIAGNOSIS — I308 Other forms of acute pericarditis: Secondary | ICD-10-CM

## 2020-03-22 DIAGNOSIS — I2510 Atherosclerotic heart disease of native coronary artery without angina pectoris: Secondary | ICD-10-CM | POA: Diagnosis not present

## 2020-03-22 DIAGNOSIS — Z955 Presence of coronary angioplasty implant and graft: Secondary | ICD-10-CM

## 2020-03-22 DIAGNOSIS — R079 Chest pain, unspecified: Secondary | ICD-10-CM | POA: Diagnosis not present

## 2020-03-22 LAB — C-REACTIVE PROTEIN: CRP: 15.2 mg/dL — ABNORMAL HIGH (ref <1.0)

## 2020-03-22 LAB — BASIC METABOLIC PANEL
Anion gap: 8 (ref 5–15)
BUN: 14 mg/dL (ref 8–23)
CO2: 20 mmol/L — ABNORMAL LOW (ref 22–32)
Calcium: 8.7 mg/dL — ABNORMAL LOW (ref 8.9–10.3)
Chloride: 107 mmol/L (ref 98–111)
Creatinine, Ser: 0.93 mg/dL (ref 0.44–1.00)
GFR, Estimated: >60 mL/min (ref >60)
Glucose, Bld: 155 mg/dL — ABNORMAL HIGH (ref 70–99)
Potassium: 3.6 mmol/L (ref 3.5–5.1)
Sodium: 135 mmol/L (ref 135–145)

## 2020-03-22 LAB — SEDIMENTATION RATE: Sed Rate: 53 mm/hr — ABNORMAL HIGH (ref 0–22)

## 2020-03-22 MED ORDER — COLCHICINE 0.6 MG PO TABS: 0.6000 mg | ORAL_TABLET | Freq: Every day | ORAL | Status: DC

## 2020-03-22 MED ORDER — COLCHICINE 0.6 MG PO TABS: 0.6000 mg | ORAL_TABLET | Freq: Every day | ORAL | 0 refills | Status: DC

## 2020-03-22 MED ORDER — ATORVASTATIN CALCIUM 80 MG PO TABS: 80.0000 mg | ORAL_TABLET | Freq: Every day | ORAL | 0 refills | Status: DC

## 2020-03-22 MED ORDER — ASPIRIN 81 MG PO CHEW: 81.0000 mg | CHEWABLE_TABLET | Freq: Every day | ORAL | 0 refills | Status: AC

## 2020-03-22 NOTE — Progress Notes (Signed)
AVS given to patient and explained at the bedside. Medications and follow up appointments have been explained with pt verbalizing understanding.  

## 2020-03-22 NOTE — Plan of Care (Signed)

## 2020-03-22 NOTE — Progress Notes (Signed)
PT Cancellation Note  Patient Details Name: Susan [name] MRN: [id number] DOB: June 12, 1933   Cancelled Treatment:    Reason Eval/Treat Not Completed: PT screened, no needs identified, patient to Dc home.   Claretha Cooper 03/22/2020, 1:53 PM [place] Pager [phone] Office [phone]

## 2020-03-22 NOTE — Progress Notes (Signed)
Progress Note  Patient Name: Susan [name] Date of Encounter: 03/22/2020  [facility] Cardiologist: Mertie Moores, MD   Subjective   Feeling much better.  Pleuritic CP improving.  Inpatient Medications    Scheduled Meds: . aspirin  81 mg Oral Daily  . atorvastatin  80 mg Oral Daily  . carvedilol  3.125 mg Oral Daily  . cholecalciferol  1,000 Units Oral QPC breakfast  . clopidogrel  75 mg Oral Q breakfast  . levothyroxine  75 mcg Oral QAC breakfast  . multivitamin  1 tablet Oral BID  . omega-3 acid ethyl esters  1 g Oral QHS  . sodium chloride flush  3 mL Intravenous Q12H  . venlafaxine XR  150 mg Oral Daily  . vitamin B-12  2,000 mcg Oral Daily   Continuous Infusions: . sodium chloride     PRN Meds: sodium chloride, acetaminophen, ALPRAZolam, morphine injection, ondansetron (ZOFRAN) IV, sodium chloride flush, zolpidem   Vital Signs    Vitals:   03/21/20 2003 03/22/20 0009 03/22/20 0636 03/22/20 1059  BP: (!) 100/50 (!) 96/56 (!) 103/59 102/76  Pulse: 87 82 84 81  Resp: _0 Temp: 98.3 F (36.8 C) 99 F (37.2 C) 98.6 F (37 C)   TempSrc: Oral Oral Oral   SpO2: 95% 91% 93%   Weight:      Height:        Intake/Output Summary (Last 24 hours) at 03/22/2020 1109 Last data filed at 03/22/2020 0900 Gross per 24 hour  Intake 480 ml  Output 200 ml  Net 280 ml   Last 3 Weights 03/20/2020 06/15/2019 12/12/2018  Weight (lbs) 136 lb 136 lb 1.9 oz 134 lb 1.9 oz  Weight (kg) 61.689 kg 61.744 kg 60.836 kg      Telemetry    Sinus rhythm.  PVCs - Personally Reviewed  ECG    n/a - Personally Reviewed  Physical Exam   VS:  BP 102/76 (BP Location: Right Arm)   Pulse 81   Temp 98.6 F (37 C) (Oral)   Resp 18   Ht _1  (1.6 m)   Wt 61.7 kg   SpO2 93%   BMI 24.09 kg/m  , BMI Body mass index is 24.09 kg/m. GENERAL:  Well appearing HEENT: Pupils equal round and reactive, fundi not visualized, oral mucosa unremarkable NECK:  No jugular venous  distention, waveform within normal limits, carotid upstroke brisk and symmetric, no bruits LUNGS:  Clear to auscultation bilaterally HEART:  RRR.  PMI not displaced or sustained,S1 and S2 within normal limits, no S3, no S4, no clicks, no rubs, no murmurs ABD:  Flat, positive bowel sounds normal in frequency in pitch, no bruits, no rebound, no guarding, no midline pulsatile mass, no hepatomegaly, no splenomegaly EXT:  2 plus pulses throughout, no edema, no cyanosis no clubbing SKIN:  No rashes no nodules NEURO:  Cranial nerves II through XII grossly intact, motor grossly intact throughout PSYCH:  Cognitively intact, oriented to person place and time   Labs    High Sensitivity Troponin:   Recent Labs  Lab 03/21/20 0039 03/21/20 0239  TROPONINIHS 10 9      Chemistry Recent Labs  Lab 03/21/20 0223 03/21/20 0856 03/22/20 1000  NA 144  --  135  K 2.5*  --  3.6  CL 114*  --  107  CO2  --   --  20*  GLUCOSE 82  --  155*  BUN 11  --  14  CREATININE 0.40* 0.82 0.93  CALCIUM  --   --  8.7*  GFRNONAA  --  >60 >60  ANIONGAP  --   --  8     Hematology Recent Labs  Lab 03/21/20 0039 03/21/20 0223 03/21/20 0500  WBC 10.1  --  11.2*  RBC 4.20  --  3.18*  HGB 13.7 10.5* 10.3*  HCT 41.6 31.0* 31.9*  MCV 99.0  --  100.3*  MCH 32.6  --  32.4  MCHC 32.9  --  32.3  RDW 13.6  --  13.8  PLT 210  --  166    BNP Recent Labs  Lab 03/21/20 0856  BNP 282.7*     DDimer No results for input(s): DDIMER in the last 168 hours.   Radiology    DG Chest 2 View  Result Date: 03/21/2020 CLINICAL DATA:  86 year old female with shortness of breath following recent COVID-19 booster. EXAM: CHEST - 2 VIEW COMPARISON:  Chest radiographs 02/04/2018 and earlier. FINDINGS: Stable lung volumes and mediastinal contours since 2019. Mild cardiomegaly. Visualized tracheal air column is within normal limits. No pneumothorax, pulmonary edema, pleural effusion or confluent pulmonary opacity. Upper  thoracic vertebral augmentation since the prior study. Abdominal Calcified aortic atherosclerosis. Negative visible bowel gas pattern. IMPRESSION: 1. No acute cardiopulmonary abnormality. 2. Mild cardiomegaly.  Aortic Atherosclerosis (ICD10-I70.0). A written preliminary report of this exam during a planned IT downtime was issued by Dr. Jesse Fall at 2353 hours. Electronically Signed   By: Genevie Ann M.D.   On: 03/21/2020 01:51   CT Angio Chest PE W and/or Wo Contrast  Result Date: 03/21/2020 CLINICAL DATA:  Chest pain, dyspnea EXAM: CT ANGIOGRAPHY CHEST WITH CONTRAST TECHNIQUE: Multidetector CT imaging of the chest was performed using the standard protocol during bolus administration of intravenous contrast. Multiplanar CT image reconstructions and MIPs were obtained to evaluate the vascular anatomy. CONTRAST:  58m OMNIPAQUE IOHEXOL 350 MG/ML SOLN COMPARISON:  None. FINDINGS: Cardiovascular: There is adequate opacification of the pulmonary arterial tree through the segmental level. There is no intraluminal filling defect to suggest acute pulmonary embolism. The central pulmonary arteries are enlarged in keeping with changes of pulmonary arterial hypertension. There is extensive multi-vessel coronary artery calcification, grossly stable since prior examination. There is mild global cardiomegaly with left ventricular dilation noted. No pericardial effusion. The thoracic aortic arch is dilated measuring 3.2 cm in maximal diameter and measuring 2.9 cm in diameter at the level of the left atrium. Mild atherosclerotic calcification is seen within the thoracic aorta. Mediastinum/Nodes: No enlarged mediastinal, hilar, or axillary lymph nodes. Thyroid gland, trachea, and esophagus demonstrate no significant findings. Lungs/Pleura: There is mild bibasilar atelectasis or scarring. The lungs are otherwise clear. No pneumothorax or pleural effusion. Central airways are widely patent. Upper Abdomen: No acute abnormality.  Musculoskeletal: The osseous structures are diffusely osteopenic. There is progressive loss of height of a T6 compression fracture when compared to prior examination of 01/31/2018 with mild focal kyphosis at this level as a result. Multiple healed right rib fractures are noted. Remote fracture deformity of the right clavicle is partially visualized. No acute bone abnormality. Review of the MIP images confirms the above findings. IMPRESSION: No pulmonary embolism. Extensive multi-vessel coronary artery calcification. Left ventricular dilation, progressive since prior examination. Central pulmonary artery enlargement in keeping with pulmonary arterial hypertension. Dilation of the thoracic aortic arch measuring 3.2 cm. Recommend annual imaging followup by CTA or MRA. This recommendation follows 2010 ACCF/AHA/AATS/ACR/ASA/SCA/SCAI/SIR/STS/SVM Guidelines for the Diagnosis and  Management of Patients with Thoracic Aortic Disease. Circulation.2010; 121: U882-C003. Aortic aneurysm NOS (ICD10-I71.9) Aortic aneurysm NOS (ICD10-I71.9). Electronically Signed   By: Fidela Salisbury MD   On: 03/21/2020 04:31   CARDIAC CATHETERIZATION  Result Date: 03/21/2020  Previously placed Prox RCA to Mid RCA stent (unknown type) is widely patent.  Previously placed Dist RCA stent (unknown type) is widely patent.  Mid LAD lesion is 70% stenosed.  CHIZARA MENA is a 86 y.o. female  [id number] LOCATION:  FACILITY: [facility] PHYSICIAN: Quay Burow, M.D. [birth year] DATE OF PROCEDURE:  03/21/2020 DATE OF DISCHARGE: CARDIAC CATHETERIZATION History obtained from chart review.HADEEL HILLEBRAND is a 86 y.o. female with a hx of CAD hx stent to RCA  2005, also mod prox LAD stenosis, hypothyroid, HLD, decreased EF 40-45 %, LBBB,  who was seen in the emergency room today by definition for chest pain.  She has a history of remote RCA stenting.  Cath performed by Dr. Saunders Revel 10/08/2017 revealed patent mid and distal RCA stents and 60 to 70% segmental mid LAD  disease.  Medical therapy was recommended.  She received her Covid booster on Monday and developed chest pain on Wednesday which was pleuritic and presented to the emergency room and was long hospital.  Her EKG showed left bundle branch block which was old.  Her enzymes were low and flat.  Because of her chest pain and known CAD she was transferred to [facility] for diagnostic coronary angiography.   Ms. Schnitzer has patent stents in her mid and distal RCA.  Her mid LAD disease is unchanged since her last cath in June 2019.  Her anatomy is for all intents and purposes unchanged.  Given the pleuritic nature of her symptoms and the fact that her enzymes are negative I favor conservative/medical therapy.  The sheath was removed and a TR band was placed on the right wrist to achieve patent hemostasis.  The patient left the lab in stable condition.  Dr. Johnsie Cancel, her attending cardiologist, is aware of her catheterization results. Quay Burow. MD, [facility] 03/21/2020 4:52 PM   ECHOCARDIOGRAM COMPLETE  Result Date: 03/21/2020    ECHOCARDIOGRAM REPORT   Patient Name:   JACKIE RUSSMAN Date of Exam: 03/21/2020 Medical Rec #:  [id number]        Height:       63.0 in Accession #:    [id number]       Weight:       136.0 lb Date of Birth:  Sep 25, 1933         BSA:          1.641 m Patient Age:    69 years         BP:           102/66 mmHg Patient Gender: F                HR:           91 bpm. Exam Location:  Inpatient Procedure: 2D Echo, 3D Echo, Strain Analysis, Color Doppler and Cardiac Doppler Indications:    Chest Pain 786.50 / R07.9  History:        Patient has prior history of Echocardiogram examinations, most                 recent 11/13/2018. CAD, Arrythmias:LBBB; Risk                 Factors:Dyslipidemia. Cardiomyopathy, Hypothyroidism.  Sonographer:    Darlina Sicilian RDCS Referring  Phys: [id number] Jonnie Finner  Sonographer Comments: Global longitudinal strain was attempted. IMPRESSIONS  1. Left ventricular  ejection fraction, by estimation, is 35 to 40%. The left ventricle has moderately decreased function. The left ventricle demonstrates regional wall motion abnormalities. Abnormal (paradoxical) septal motion, consistent with left bundle branch block. Inferior hypokinesis. There is moderate asymmetric left ventricular hypertrophy of the basal-septal segment. Left ventricular diastolic parameters are consistent with Grade II diastolic dysfunction (pseudonormalization). Elevated left atrial pressure.  2. Right ventricular systolic function is normal. The right ventricular size is normal. There is normal pulmonary artery systolic pressure. The estimated right ventricular systolic pressure is 48.5 mmHg.  3. The mitral valve is abnormal. Mild mitral valve regurgitation. Moderate mitral annular calcification.  4. The aortic valve was not well visualized. Aortic valve regurgitation is mild to moderate. Mild to moderate aortic valve sclerosis/calcification is present, without any evidence of aortic stenosis.  5. The inferior vena cava is normal in size with greater than 50% respiratory variability, suggesting right atrial pressure of 3 mmHg. FINDINGS  Left Ventricle: Left ventricular ejection fraction, by estimation, is 35 to 40%. The left ventricle has moderately decreased function. The left ventricle demonstrates regional wall motion abnormalities. The left ventricular internal cavity size was normal in size. There is moderate asymmetric left ventricular hypertrophy of the basal-septal segment. Abnormal (paradoxical) septal motion, consistent with left bundle branch block. Left ventricular diastolic parameters are consistent with Grade II diastolic dysfunction (pseudonormalization). Elevated left atrial pressure. Right Ventricle: The right ventricular size is normal. Right vetricular wall thickness was not assessed. Right ventricular systolic function is normal. There is normal pulmonary artery systolic pressure. The  tricuspid regurgitant velocity is 2.50 m/s, and with an assumed right atrial pressure of 3 mmHg, the estimated right ventricular systolic pressure is 46.2 mmHg. Left Atrium: Left atrial size was normal in size. Right Atrium: Right atrial size was normal in size. Pericardium: There is no evidence of pericardial effusion. Mitral Valve: The mitral valve is abnormal. Moderate mitral annular calcification. Mild mitral valve regurgitation. Tricuspid Valve: The tricuspid valve is normal in structure. Tricuspid valve regurgitation is mild. Aortic Valve: The aortic valve was not well visualized. Aortic valve regurgitation is mild to moderate. Aortic regurgitation PHT measures 352 msec. Mild to moderate aortic valve sclerosis/calcification is present, without any evidence of aortic stenosis. Pulmonic Valve: The pulmonic valve was not well visualized. Pulmonic valve regurgitation is trivial. Aorta: The aortic root and ascending aorta are structurally normal, with no evidence of dilitation. Venous: The inferior vena cava is normal in size with greater than 50% respiratory variability, suggesting right atrial pressure of 3 mmHg. IAS/Shunts: The interatrial septum was not well visualized.  LEFT VENTRICLE PLAX 2D LVIDd:         3.60 cm      Diastology LVIDs:         3.00 cm      LV e' medial:    4.13 cm/s LV PW:         1.10 cm      LV E/e' medial:  17.4 LV IVS:        1.30 cm      LV e' lateral:   5.98 cm/s LVOT diam:     2.00 cm      LV E/e' lateral: 12.0 LV SV:         52 LV SV Index:   32 LVOT Area:     3.14 cm  LV Volumes (MOD) LV vol d,  MOD A2C: 118.0 ml LV vol d, MOD A4C: 117.0 ml LV vol s, MOD A2C: 81.1 ml LV vol s, MOD A4C: 83.9 ml LV SV MOD A2C:     36.9 ml LV SV MOD A4C:     117.0 ml LV SV MOD BP:      36.2 ml RIGHT VENTRICLE TAPSE (M-mode): 1.0 cm LEFT ATRIUM             Index       RIGHT ATRIUM          Index LA diam:        2.70 cm 1.65 cm/m  RA Area:     8.90 cm LA Vol (A2C):   52.8 ml 32.17 ml/m RA Volume:    15.70 ml 9.57 ml/m LA Vol (A4C):   37.8 ml 23.03 ml/m LA Biplane Vol: 46.9 ml 28.57 ml/m  AORTIC VALVE LVOT Vmax:   103.00 cm/s LVOT Vmean:  69.600 cm/s LVOT VTI:    0.165 m AI PHT:      352 msec  AORTA Ao Root diam: 3.60 cm Ao Asc diam:  3.30 cm MITRAL VALVE                TRICUSPID VALVE MV Area (PHT): 4.91 cm     TR Peak grad:   25.0 mmHg MV Decel Time: 155 msec     TR Vmax:        250.00 cm/s MV E velocity: 71.95 cm/s MV A velocity: 113.50 cm/s  SHUNTS MV E/A ratio:  0.63         Systemic VTI:  0.16 m                             Systemic Diam: 2.00 cm Oswaldo Milian MD Electronically signed by Oswaldo Milian MD Signature Date/Time: 03/21/2020/1:40:14 PM    Final     Cardiac Studies   LHC 03/22/20:  Previously placed Prox RCA to Mid RCA stent (unknown type) is widely patent.  Previously placed Dist RCA stent (unknown type) is widely patent.  Mid LAD lesion is 70% stenosed.  Echo 03/22/20: 1. Left ventricular ejection fraction, by estimation, is 35 to 40%. The  left ventricle has moderately decreased function. The left ventricle  demonstrates regional wall motion abnormalities. Abnormal (paradoxical)  septal motion, consistent with left  bundle branch block. Inferior hypokinesis. There is moderate asymmetric  left ventricular hypertrophy of the basal-septal segment. Left ventricular  diastolic parameters are consistent with Grade II diastolic dysfunction  (pseudonormalization). Elevated  left atrial pressure.  2. Right ventricular systolic function is normal. The right ventricular  size is normal. There is normal pulmonary artery systolic pressure. The  estimated right ventricular systolic pressure is 23.3 mmHg.  3. The mitral valve is abnormal. Mild mitral valve regurgitation.  Moderate mitral annular calcification.  4. The aortic valve was not well visualized. Aortic valve regurgitation  is mild to moderate. Mild to moderate aortic valve sclerosis/calcification    is present, without any evidence of aortic stenosis.  5. The inferior vena cava is normal in size with greater than 50%  respiratory variability, suggesting right atrial pressure of 3 mmHg.   Patient Profile     86 y.o. female with CAD s/p PCI with residual LAD disease, chronic systolic and diastolic heart failure, LBBB, and hyperlipidemia admitted with pleuritic chest pain.   Assessment & Plan    # CAD s/p PCI: # Pleuritic CP:  Symptoms began 2 days after COVID-19 booster.  LHC unchanged from prior. No progression of LAD disease and stents patient. Chest CT negative for PE.  Pain is improving.  CRP is elevated.  This may be pericarditis from recent vaccination.  Will give colchicine 0.41m daily x30 days.  Symptoms are resolving so will not start NSAIDS.  ESR pending. Continue aspirin, carvedilol and atorvastatin.  [facility] will sign off.   Medication Recommendations:  Start colchicine Other recommendations (labs, testing, etc):  none Follow up as an outpatient:  We will arrange        For questions or updates, please contact CBriarcliffe AcresPlease consult [email or web address] for contact info under        Signed, TSkeet Latch MD  03/22/2020, 11:09 AM

## 2020-03-22 NOTE — Progress Notes (Signed)
Pt alert and aware sitting up in bed. Pt states she is doing so much better. She wanted to praise God for using the doctor and tech help her to feel better. She talked about how she came up in church and all the stuff she learned as a young one has served her well. And she is trying to pass it on to the younger generation. The chaplain offered caring and supportive presence and listening ear. We sang sacred music, and prayed and offered  blessings. Further visits will be offered.

## 2020-03-22 NOTE — Discharge Summary (Addendum)
Physician Discharge Summary  Susan [name] IDP:[id number] DOB: [birth year] DOA: 03/20/2020  PCP: Crist Infante, MD  Admit date: 03/20/2020 Discharge date: 03/22/2020  Time spent: 40 minutes  Recommendations for Outpatient Follow-up:  1. Follow outpatient CBC/CMP 2. Complete colchicine course 3. Follow with cardiology as outpatient  4. Decreased EF -> follow outpatient with cardiology 5. Dilation of thoracic aortic arch -> follow outpatient   Discharge Diagnoses:  Active Problems:   Chest pain   Discharge Condition: stable  Diet recommendation: heart healthy  Filed Weights   03/20/20 2303  Weight: 61.7 kg    History of present illness:  Susan [name] is Susan [name] 86 y.o. female with medical history significant of CAD w/ stents, HLD, hypothyroidism. Presenting with 1 day of severe (9 to10/10) midsternal chest pain. She reports yesterday afternoon, she started having difficulty breathing. She wasn't doing anything in particular. She tried to rest and realized that she was having chest heaviness/pressure as if "something were weighing me down." Midsternal, nonradiating, heaviness that is constant even at rest. She does not know of any aggravating factors or alleviating factors. She did not try any treatment at home. She came to the ED out of concern for an MI.   Of note, she got Susan [name] J&J COVID booster on Monday. She states that the next day she didn't feel so great (fatigue, weakness); but denies CP at that time.     She was seen for chest pain.  She had Susan [name] catheterization by cardiology which was stable.  Her symptoms have resolved and she'll be discharged with Susan [name] plan to treat with colchicine x30 days for presumptive pericarditis.    Hospital Course:  Chest pain     - CTA PE negative for PE     - trp neg x 2     - echo with EF 35-40%, grade II diastolic dysfunction (EF decreased from prior, follow with cards outpatient)     - consulted cardiology - s/p catheterization with unchanged  anatomy - recommended medical therapy - will treat with colchicine for presumptive pericarditis  Hypokalemia     - improved  Macrocytic anemia     - no evidence of bleed     - check B12, folate - wnl  Anxiety/Depression     - continue effexor  Hypothyroidism     - continue levothyroxine  Dilation of thoracic aortic arch       - follow outpatient imaging   Procedures: Cath IMPRESSION: Susan [name] has patent stents in her mid and distal RCA.  Her mid LAD disease is unchanged since her last cath in June 2019.  Her anatomy is for all intents and purposes unchanged.  Given the pleuritic nature of her symptoms and the fact that her enzymes are negative I favor conservative/medical therapy.  The sheath was removed and Susan [name] TR band was placed on the right wrist to achieve patent hemostasis.  The patient left the lab in stable condition.  Dr. Johnsie Cancel, her attending cardiologist, is aware of her catheterization results.  Echo IMPRESSIONS    1. Left ventricular ejection fraction, by estimation, is 35 to 40%. The  left ventricle has moderately decreased function. The left ventricle  demonstrates regional wall motion abnormalities. Abnormal (paradoxical)  septal motion, consistent with left  bundle branch block. Inferior hypokinesis. There is moderate asymmetric  left ventricular hypertrophy of the basal-septal segment. Left ventricular  diastolic parameters are consistent with Grade II diastolic dysfunction  (pseudonormalization). Elevated  left atrial pressure.  2.  Right ventricular systolic function is normal. The right ventricular  size is normal. There is normal pulmonary artery systolic pressure. The  estimated right ventricular systolic pressure is 19.1 mmHg.  3. The mitral valve is abnormal. Mild mitral valve regurgitation.  Moderate mitral annular calcification.  4. The aortic valve was not well visualized. Aortic valve regurgitation  is mild to moderate. Mild to moderate  aortic valve sclerosis/calcification  is present, without any evidence of aortic stenosis.  5. The inferior vena cava is normal in size with greater than 50%  respiratory variability, suggesting right atrial pressure of 3 mmHg.   Consultations:  cardiology  Discharge Exam: Vitals:   03/22/20 0636 03/22/20 1059  BP: (!) 103/59 102/76  Pulse: 84 81  Resp: 18   Temp: 98.6 F (37 C)   SpO2: 93%    Eager to d/c home CP resolved No new complaints  General: No acute distress. Cardiovascular: Heart sounds show Susan [name] regular rate, and rhythm. Lungs: Clear to auscultation bilaterally  Abdomen: Soft, nontender, nondistended  Neurological: Alert and oriented 3. Moves all extremities 4. Cranial nerves II through XII grossly intact. Skin: Warm and dry. No rashes or lesions. Extremities: No clubbing or cyanosis. No edema  Discharge Instructions   Discharge Instructions    Call MD for:  difficulty breathing, headache or visual disturbances   Complete by: As directed    Call MD for:  extreme fatigue   Complete by: As directed    Call MD for:  hives   Complete by: As directed    Call MD for:  persistant dizziness or light-headedness   Complete by: As directed    Call MD for:  persistant nausea and vomiting   Complete by: As directed    Call MD for:  redness, tenderness, or signs of infection (pain, swelling, redness, odor or green/yellow discharge around incision site)   Complete by: As directed    Call MD for:  severe uncontrolled pain   Complete by: As directed    Call MD for:  temperature >100.4   Complete by: As directed    Diet - low sodium heart healthy   Complete by: As directed    Discharge instructions   Complete by: As directed    You presented with chest pain.  You had Susan [name] catheterization which showed patent stents and unchanged disease.   We'll treat you for presumed pericarditis possibly related to vaccination with colchicine.    Continue your aspirin.  Continue  crestor.  Your ejection fraction (squeeze) was decreased from your previous echo.  Please follow up with cardiology outpatient for this.  You had abnormal imaging (dilation of the thoracic aortic arch) which should be followed outpatient.  Return for new, recurrent, or worsening symptoms.  Please ask your PCP to request records from this hospitalization so they know what was done and what the next steps will be.   Increase activity slowly   Complete by: As directed      Allergies as of 03/22/2020   No Known Allergies     Medication List    TAKE these medications   acetaminophen 325 MG tablet Commonly known as: TYLENOL Take 2 tablets (650 mg total) by mouth every 6 (six) hours. What changed:   when to take this  reasons to take this   aspirin 81 MG chewable tablet Chew 1 tablet (81 mg total) by mouth daily. Start taking on: March 23, 2020   CALCIUM + D PO Take 2 tablets by  mouth at bedtime.   carvedilol 3.125 MG tablet Commonly known as: COREG Take 3.125 mg by mouth daily.   cholecalciferol 25 MCG (1000 UNIT) tablet Commonly known as: VITAMIN D3 Take 1,000 Units by mouth daily after breakfast.   clopidogrel 75 MG tablet Commonly known as: PLAVIX Take 1 tablet by mouth once daily   colchicine 0.6 MG tablet Take 1 tablet (0.6 mg total) by mouth daily.   Fish Oil 1200 MG Caps Take 1,200 mg by mouth at bedtime.   levothyroxine 75 MCG tablet Commonly known as: SYNTHROID Take 75 mcg by mouth daily before breakfast.   multivitamin-lutein Caps capsule Take 1 capsule by mouth 2 (two) times Brasen Bundren day.   nitroGLYCERIN 0.4 MG SL tablet Commonly known as: NITROSTAT Place 1 tablet (0.4 mg total) under the tongue every 5 (five) minutes as needed.   rosuvastatin 40 MG tablet Commonly known as: CRESTOR Take 1 tablet (40 mg total) by mouth daily.   venlafaxine XR 150 MG 24 hr capsule Commonly known as: EFFEXOR-XR Take 1 capsule by mouth once daily   vitamin B-12  1000 MCG tablet Commonly known as: CYANOCOBALAMIN Take 2,000 mcg by mouth daily.      No Known Allergies  Follow-up Information    Nahser, Wonda Cheng, MD Follow up on 03/26/2020.   Specialty: Cardiology Why: at 8:15 AM with his PA --[employer] information: [place] [id number] [phone]                The results of significant diagnostics from this hospitalization (including imaging, microbiology, ancillary and laboratory) are listed below for reference.    Significant Diagnostic Studies: DG Chest 2 View  Result Date: 03/21/2020 CLINICAL DATA:  86 year old female with shortness of breath following recent COVID-19 booster. EXAM: CHEST - 2 VIEW COMPARISON:  Chest radiographs 02/04/2018 and earlier. FINDINGS: Stable lung volumes and mediastinal contours since 2019. Mild cardiomegaly. Visualized tracheal air column is within normal limits. No pneumothorax, pulmonary edema, pleural effusion or confluent pulmonary opacity. Upper thoracic vertebral augmentation since the prior study. Abdominal Calcified aortic atherosclerosis. Negative visible bowel gas pattern. IMPRESSION: 1. No acute cardiopulmonary abnormality. 2. Mild cardiomegaly.  Aortic Atherosclerosis (ICD10-I70.0). Chanese Hartsough written preliminary report of this exam during Lorrinda Ramstad planned IT downtime was issued by Dr. Jesse Fall at 2353 hours. Electronically Signed   By: Genevie Ann M.D.   On: 03/21/2020 01:51   CT Angio Chest PE W and/or Wo Contrast  Result Date: 03/21/2020 CLINICAL DATA:  Chest pain, dyspnea EXAM: CT ANGIOGRAPHY CHEST WITH CONTRAST TECHNIQUE: Multidetector CT imaging of the chest was performed using the standard protocol during bolus administration of intravenous contrast. Multiplanar CT image reconstructions and MIPs were obtained to evaluate the vascular anatomy. CONTRAST:  83mL OMNIPAQUE IOHEXOL 350 MG/ML SOLN COMPARISON:  None. FINDINGS: Cardiovascular: There is adequate opacification of the  pulmonary arterial tree through the segmental level. There is no intraluminal filling defect to suggest acute pulmonary embolism. The central pulmonary arteries are enlarged in keeping with changes of pulmonary arterial hypertension. There is extensive multi-vessel coronary artery calcification, grossly stable since prior examination. There is mild global cardiomegaly with left ventricular dilation noted. No pericardial effusion. The thoracic aortic arch is dilated measuring 3.2 cm in maximal diameter and measuring 2.9 cm in diameter at the level of the left atrium. Mild atherosclerotic calcification is seen within the thoracic aorta. Mediastinum/Nodes: No enlarged mediastinal, hilar, or axillary lymph nodes. Thyroid gland, trachea, and esophagus demonstrate no  significant findings. Lungs/Pleura: There is mild bibasilar atelectasis or scarring. The lungs are otherwise clear. No pneumothorax or pleural effusion. Central airways are widely patent. Upper Abdomen: No acute abnormality. Musculoskeletal: The osseous structures are diffusely osteopenic. There is progressive loss of height of Luwanda Starr T6 compression fracture when compared to prior examination of 01/31/2018 with mild focal kyphosis at this level as Alynah Schone result. Multiple healed right rib fractures are noted. Remote fracture deformity of the right clavicle is partially visualized. No acute bone abnormality. Review of the MIP images confirms the above findings. IMPRESSION: No pulmonary embolism. Extensive multi-vessel coronary artery calcification. Left ventricular dilation, progressive since prior examination. Central pulmonary artery enlargement in keeping with pulmonary arterial hypertension. Dilation of the thoracic aortic arch measuring 3.2 cm. Recommend annual imaging followup by CTA or MRA. This recommendation follows 2010 ACCF/AHA/AATS/ACR/ASA/SCA/SCAI/SIR/STS/SVM Guidelines for the Diagnosis and Management of Patients with Thoracic Aortic Disease.  Circulation.2010; 121: B017-P102. Aortic aneurysm NOS (ICD10-I71.9) Aortic aneurysm NOS (ICD10-I71.9). Electronically Signed   By: Fidela Salisbury MD   On: 03/21/2020 04:31   CARDIAC CATHETERIZATION  Result Date: 03/21/2020  Previously placed Prox RCA to Mid RCA stent (unknown type) is widely patent.  Previously placed Dist RCA stent (unknown type) is widely patent.  Mid LAD lesion is 70% stenosed.  BREHANNA DEVENY is Natesha Hassey 86 y.o. female  [id number] LOCATION:  FACILITY: [facility] PHYSICIAN: Quay Burow, M.D. Oct 12, 1933 DATE OF PROCEDURE:  03/21/2020 DATE OF DISCHARGE: CARDIAC CATHETERIZATION History obtained from chart review.ANJALEE COPE is Kerri-Anne Haeberle 86 y.o. female with Alejo Beamer hx of CAD hx stent to RCA  2005, also mod prox LAD stenosis, hypothyroid, HLD, decreased EF 40-45 %, LBBB,  who was seen in the emergency room today by definition for chest pain.  She has Ashyah Quizon history of remote RCA stenting.  Cath performed by Dr. Saunders Revel 10/08/2017 revealed patent mid and distal RCA stents and 60 to 70% segmental mid LAD disease.  Medical therapy was recommended.  She received her Covid booster on Monday and developed chest pain on Wednesday which was pleuritic and presented to the emergency room and was long hospital.  Her EKG showed left bundle branch block which was old.  Her enzymes were low and flat.  Because of her chest pain and known CAD she was transferred to [facility] for diagnostic coronary angiography.   Ms. Andreas has patent stents in her mid and distal RCA.  Her mid LAD disease is unchanged since her last cath in June 2019.  Her anatomy is for all intents and purposes unchanged.  Given the pleuritic nature of her symptoms and the fact that her enzymes are negative I favor conservative/medical therapy.  The sheath was removed and Jahking Lesser TR band was placed on the right wrist to achieve patent hemostasis.  The patient left the lab in stable condition.  Dr. Johnsie Cancel, her attending cardiologist, is aware of her catheterization  results. Quay Burow. MD, [facility] 03/21/2020 4:52 PM   ECHOCARDIOGRAM COMPLETE  Result Date: 03/21/2020    ECHOCARDIOGRAM REPORT   Patient Name:   JONNELLE LAWNICZAK Date of Exam: 03/21/2020 Medical Rec #:  [id number]        Height:       63.0 in Accession #:    [id number]       Weight:       136.0 lb Date of Birth:  [birth year]         BSA:          1.641 m Patient  Age:    38 years         BP:           102/66 mmHg Patient Gender: F                HR:           91 bpm. Exam Location:  Inpatient Procedure: 2D Echo, 3D Echo, Strain Analysis, Color Doppler and Cardiac Doppler Indications:    Chest Pain 786.50 / R07.9  History:        Patient has prior history of Echocardiogram examinations, most                 recent 11/13/2018. CAD, Arrythmias:LBBB; Risk                 Factors:Dyslipidemia. Cardiomyopathy, Hypothyroidism.  Sonographer:    Darlina Sicilian RDCS Referring Phys: [id number] Jonnie Finner  Sonographer Comments: Global longitudinal strain was attempted. IMPRESSIONS  1. Left ventricular ejection fraction, by estimation, is 35 to 40%. The left ventricle has moderately decreased function. The left ventricle demonstrates regional wall motion abnormalities. Abnormal (paradoxical) septal motion, consistent with left bundle branch block. Inferior hypokinesis. There is moderate asymmetric left ventricular hypertrophy of the basal-septal segment. Left ventricular diastolic parameters are consistent with Grade II diastolic dysfunction (pseudonormalization). Elevated left atrial pressure.  2. Right ventricular systolic function is normal. The right ventricular size is normal. There is normal pulmonary artery systolic pressure. The estimated right ventricular systolic pressure is 19.3 mmHg.  3. The mitral valve is abnormal. Mild mitral valve regurgitation. Moderate mitral annular calcification.  4. The aortic valve was not well visualized. Aortic valve regurgitation is mild to moderate. Mild to moderate aortic valve  sclerosis/calcification is present, without any evidence of aortic stenosis.  5. The inferior vena cava is normal in size with greater than 50% respiratory variability, suggesting right atrial pressure of 3 mmHg. FINDINGS  Left Ventricle: Left ventricular ejection fraction, by estimation, is 35 to 40%. The left ventricle has moderately decreased function. The left ventricle demonstrates regional wall motion abnormalities. The left ventricular internal cavity size was normal in size. There is moderate asymmetric left ventricular hypertrophy of the basal-septal segment. Abnormal (paradoxical) septal motion, consistent with left bundle branch block. Left ventricular diastolic parameters are consistent with Grade II diastolic dysfunction (pseudonormalization). Elevated left atrial pressure. Right Ventricle: The right ventricular size is normal. Right vetricular wall thickness was not assessed. Right ventricular systolic function is normal. There is normal pulmonary artery systolic pressure. The tricuspid regurgitant velocity is 2.50 m/s, and with an assumed right atrial pressure of 3 mmHg, the estimated right ventricular systolic pressure is 79.0 mmHg. Left Atrium: Left atrial size was normal in size. Right Atrium: Right atrial size was normal in size. Pericardium: There is no evidence of pericardial effusion. Mitral Valve: The mitral valve is abnormal. Moderate mitral annular calcification. Mild mitral valve regurgitation. Tricuspid Valve: The tricuspid valve is normal in structure. Tricuspid valve regurgitation is mild. Aortic Valve: The aortic valve was not well visualized. Aortic valve regurgitation is mild to moderate. Aortic regurgitation PHT measures 352 msec. Mild to moderate aortic valve sclerosis/calcification is present, without any evidence of aortic stenosis. Pulmonic Valve: The pulmonic valve was not well visualized. Pulmonic valve regurgitation is trivial. Aorta: The aortic root and ascending aorta are  structurally normal, with no evidence of dilitation. Venous: The inferior vena cava is normal in size with greater than 50% respiratory variability, suggesting right atrial pressure of 3  mmHg. IAS/Shunts: The interatrial septum was not well visualized.  LEFT VENTRICLE PLAX 2D LVIDd:         3.60 cm      Diastology LVIDs:         3.00 cm      LV e' medial:    4.13 cm/s LV PW:         1.10 cm      LV E/e' medial:  17.4 LV IVS:        1.30 cm      LV e' lateral:   5.98 cm/s LVOT diam:     2.00 cm      LV E/e' lateral: 12.0 LV SV:         52 LV SV Index:   32 LVOT Area:     3.14 cm  LV Volumes (MOD) LV vol d, MOD A2C: 118.0 ml LV vol d, MOD A4C: 117.0 ml LV vol s, MOD A2C: 81.1 ml LV vol s, MOD A4C: 83.9 ml LV SV MOD A2C:     36.9 ml LV SV MOD A4C:     117.0 ml LV SV MOD BP:      36.2 ml RIGHT VENTRICLE TAPSE (M-mode): 1.0 cm LEFT ATRIUM             Index       RIGHT ATRIUM          Index LA diam:        2.70 cm 1.65 cm/m  RA Area:     8.90 cm LA Vol (A2C):   52.8 ml 32.17 ml/m RA Volume:   15.70 ml 9.57 ml/m LA Vol (A4C):   37.8 ml 23.03 ml/m LA Biplane Vol: 46.9 ml 28.57 ml/m  AORTIC VALVE LVOT Vmax:   103.00 cm/s LVOT Vmean:  69.600 cm/s LVOT VTI:    0.165 m AI PHT:      352 msec  AORTA Ao Root diam: 3.60 cm Ao Asc diam:  3.30 cm MITRAL VALVE                TRICUSPID VALVE MV Area (PHT): 4.91 cm     TR Peak grad:   25.0 mmHg MV Decel Time: 155 msec     TR Vmax:        250.00 cm/s MV E velocity: 71.95 cm/s MV Eladia Frame velocity: 113.50 cm/s  SHUNTS MV E/Tamyah Cutbirth ratio:  0.63         Systemic VTI:  0.16 m                             Systemic Diam: 2.00 cm Oswaldo Milian MD Electronically signed by Oswaldo Milian MD Signature Date/Time: 03/21/2020/1:40:14 PM    Final     Microbiology: Recent Results (from the past 240 hour(s))  Respiratory Panel by RT PCR (Flu Samiksha Pellicano&B, Covid) - Nasopharyngeal Swab     Status: None   Collection Time: 03/20/20 11:15 PM   Specimen: Nasopharyngeal Swab  Result Value Ref Range  Status   SARS Coronavirus 2 by RT PCR NEGATIVE NEGATIVE Final    Comment: (NOTE) SARS-CoV-2 target nucleic acids are NOT DETECTED.  The SARS-CoV-2 RNA is generally detectable in upper respiratoy specimens during the acute phase of infection. The lowest concentration of SARS-CoV-2 viral copies this assay can detect is 131 copies/mL. Alexandros Ewan negative result does not preclude SARS-Cov-2 infection and should not be used as the sole basis for treatment or other  patient management decisions. Setsuko Robins negative result may occur with  improper specimen collection/handling, submission of specimen other than nasopharyngeal swab, presence of viral mutation(s) within the areas targeted by this assay, and inadequate number of viral copies (<131 copies/mL). Brinda Focht negative result must be combined with clinical observations, patient history, and epidemiological information. The expected result is Negative.  Fact Sheet for Patients:  PinkCheek.be  Fact Sheet for Healthcare Providers:  GravelBags.it  This test is no t yet approved or cleared by the [place] FDA and  has been authorized for detection and/or diagnosis of SARS-CoV-2 by FDA under an Emergency Use Authorization (EUA). This EUA will remain  in effect (meaning this test can be used) for the duration of the COVID-19 declaration under Section 564(b)(1) of the Act, 21 U.S.C. section 360bbb-3(b)(1), unless the authorization is terminated or revoked sooner.     Influenza Aydon Swamy by PCR NEGATIVE NEGATIVE Final   Influenza B by PCR NEGATIVE NEGATIVE Final    Comment: (NOTE) The Xpert Xpress SARS-CoV-2/FLU/RSV assay is intended as an aid in  the diagnosis of influenza from Nasopharyngeal swab specimens and  should not be used as Laiah Pouncey sole basis for treatment. Nasal washings and  aspirates are unacceptable for Xpert Xpress SARS-CoV-2/FLU/RSV  testing.  Fact Sheet for  Patients: PinkCheek.be  Fact Sheet for Healthcare Providers: GravelBags.it  This test is not yet approved or cleared by the [place] FDA and  has been authorized for detection and/or diagnosis of SARS-CoV-2 by  FDA under an Emergency Use Authorization (EUA). This EUA will remain  in effect (meaning this test can be used) for the duration of the  Covid-19 declaration under Section 564(b)(1) of the Act, 21  U.S.C. section 360bbb-3(b)(1), unless the authorization is  terminated or revoked. Performed at [facility], [place] [address], [place], [place] [address]      Labs: Basic Metabolic Panel: Recent Labs  Lab 03/21/20 0223 03/21/20 0856 03/22/20 1000  NA 144  --  135  K 2.5*  --  3.6  CL 114*  --  107  CO2  --   --  20*  GLUCOSE 82  --  155*  BUN 11  --  14  CREATININE 0.40* 0.82 0.93  CALCIUM  --   --  8.7*  MG  --  1.9  --    Liver Function Tests: No results for input(s): AST, ALT, ALKPHOS, BILITOT, PROT, ALBUMIN in the last 168 hours. No results for input(s): LIPASE, AMYLASE in the last 168 hours. No results for input(s): AMMONIA in the last 168 hours. CBC: Recent Labs  Lab 03/21/20 0039 03/21/20 0223 03/21/20 0500  WBC 10.1  --  11.2*  NEUTROABS 6.9  --   --   HGB 13.7 10.5* 10.3*  HCT 41.6 31.0* 31.9*  MCV 99.0  --  100.3*  PLT 210  --  166   Cardiac Enzymes: No results for input(s): CKTOTAL, CKMB, CKMBINDEX, TROPONINI in the last 168 hours. BNP: BNP (last 3 results) Recent Labs    03/21/20 0856  BNP 282.7*    ProBNP (last 3 results) No results for input(s): PROBNP in the last 8760 hours.  CBG: No results for input(s): GLUCAP in the last 168 hours.     Signed:  Fayrene Helper MD.  [facility] 03/22/2020, 1:02 PM

## 2020-03-26 ENCOUNTER — Other Ambulatory Visit: Payer: Self-pay

## 2020-03-26 ENCOUNTER — Encounter: Payer: Self-pay | Admitting: Physician Assistant

## 2020-03-26 ENCOUNTER — Ambulatory Visit: Payer: Medicare Other | Admitting: Physician Assistant

## 2020-03-26 VITALS — BP 110/82 | HR 74 | Ht 63.5 in | Wt 134.0 lb

## 2020-03-26 DIAGNOSIS — I2510 Atherosclerotic heart disease of native coronary artery without angina pectoris: Secondary | ICD-10-CM | POA: Diagnosis not present

## 2020-03-26 DIAGNOSIS — I301 Infective pericarditis: Secondary | ICD-10-CM | POA: Diagnosis not present

## 2020-03-26 DIAGNOSIS — I5042 Chronic combined systolic (congestive) and diastolic (congestive) heart failure: Secondary | ICD-10-CM

## 2020-03-26 DIAGNOSIS — E785 Hyperlipidemia, unspecified: Secondary | ICD-10-CM | POA: Diagnosis not present

## 2020-03-26 NOTE — Progress Notes (Signed)
Cardiology Office Note:    Date:  03/26/2020   ID:  Susan [name], DOB 1934/02/18, MRN [id number]  PCP:  Crist Infante, MD  [facility] Cardiologist:  Mertie Moores, MD  [facility] Electrophysiologist:  None   Chief Complaint: hospital follow up  History of Present Illness:     Susan [name] is a 86 y.o. female with a hx of CAD s/p stent to RCA  2005 with residual mod prox LAD stenosis, hypothyroid, HLD, chronic combined CHF and  LBBB seen for hospital follow up.   Admitted 03/2020 for pleuritic chest pain. Symptoms began 2 days after COVID-19 booster.  LHC unchanged from prior. No progression of LAD disease and patent RCA stent. Echo showed LVEF of 35-40% and grade II DD. Chest CT negative for PE. CRP elevated. Felt possible pericarditis from recent vaccination.  started colchicine 0.6mg  daily x30 days.  Symptoms are improved so did not started NSAIDS.  Here today for follow up.  Never started her colchicine because list of side effects from paper given by pharmacy.  No single episode of chest discomfort or shortness of breath.  She thinks she is back to her baseline.  Right radial bruising is improving.  No dizziness, palpitation, orthopnea, PND, syncope, lower extremity edema or melena.  Past Medical History:  Diagnosis Date  . Coronary artery disease    POST PTCA AND STENTING OF HER RIGHT CORONARY ARTERY  . Hypothyroidism   . LAD stenosis    MODERATE 50-60% STENOSIS  . Osteoporosis     Past Surgical History:  Procedure Laterality Date  . APPENDECTOMY  1970s?   "ruptured"   . BACK SURGERY    . CATARACT EXTRACTION W/ INTRAOCULAR LENS  IMPLANT, BILATERAL Bilateral   . CORONARY ANGIOPLASTY WITH STENT PLACEMENT  ~ 2009   "2 stents"  . FRACTURE SURGERY    . IR VERTEBROPLASTY CERV/THOR BX INC UNI/BIL INC/INJECT/IMAGING  02/07/2018  . KNEE ARTHROSCOPY Right     torn meniscus  . LEFT HEART CATH AND CORONARY ANGIOGRAPHY N/A 03/21/2020   Procedure: LEFT HEART CATH AND  CORONARY ANGIOGRAPHY;  Surgeon: Lorretta Harp, MD;  Location: [place] [facility];  Service: Cardiovascular;  Laterality: N/A;  . [place]  . OVARIAN CYST SURGERY  1970s?   "ruptured"   . RIGHT/LEFT HEART CATH AND CORONARY ANGIOGRAPHY N/A 10/08/2017   Procedure: RIGHT/LEFT HEART CATH AND CORONARY ANGIOGRAPHY;  Surgeon: Nelva Bush, MD;  Location: [place] [facility];  Service: Cardiovascular;  Laterality: N/A;  . WRIST FRACTURE SURGERY Bilateral    "2 surgeries for 2 breaks on left side; 1 surgery for 1 break on right wrist"    Current Medications: Current Meds  Medication Sig  . acetaminophen (TYLENOL) 325 MG tablet Take 2 tablets (650 mg total) by mouth every 6 (six) hours. (Patient taking differently: Take 650 mg by mouth every 6 (six) hours as needed for mild pain. )  . aspirin 81 MG chewable tablet Chew 1 tablet (81 mg total) by mouth daily.  . Calcium Carbonate-Vitamin D (CALCIUM + D PO) Take 2 tablets by mouth at bedtime.   . carvedilol (COREG) 3.125 MG tablet Take 3.125 mg by mouth daily.  . cholecalciferol (VITAMIN D3) 25 MCG (1000 UT) tablet Take 1,000 Units by mouth daily after breakfast.  . clopidogrel (PLAVIX) 75 MG tablet Take 1 tablet by mouth once daily  . colchicine 0.6 MG tablet Take 1 tablet (0.6 mg total) by mouth daily.  Marland Kitchen levothyroxine (  SYNTHROID, LEVOTHROID) 75 MCG tablet Take 75 mcg by mouth daily before breakfast.   . multivitamin-lutein (OCUVITE-LUTEIN) CAPS capsule Take 1 capsule by mouth 2 (two) times a day.   . nitroGLYCERIN (NITROSTAT) 0.4 MG SL tablet Place 1 tablet (0.4 mg total) under the tongue every 5 (five) minutes as needed.  . Omega-3 Fatty Acids (FISH OIL) 1200 MG CAPS Take 1,200 mg by mouth at bedtime.  . rosuvastatin (CRESTOR) 40 MG tablet Take 1 tablet (40 mg total) by mouth daily.  Marland Kitchen venlafaxine XR (EFFEXOR-XR) 150 MG 24 hr capsule Take 1 capsule by mouth once daily  . vitamin B-12 (CYANOCOBALAMIN) 1000 MCG tablet Take 2,000  mcg by mouth daily.      Allergies:   Patient has no known allergies.   Social History   Socioeconomic History  . Marital status: Widowed    Spouse name: Not on file  . Number of children: Not on file  . Years of education: Not on file  . Highest education level: Not on file  Occupational History  . Not on file  Tobacco Use  . Smoking status: Never Smoker  . Smokeless tobacco: Never Used  Vaping Use  . Vaping Use: Never used  Substance and Sexual Activity  . Alcohol use: Yes    Alcohol/week: 1.0 standard drink    Types: 1 Glasses of wine per week  . Drug use: Never  . Sexual activity: Not on file  Other Topics Concern  . Not on file  Social History Narrative  . Not on file   Social Determinants of Health   Financial Resource Strain:   . Difficulty of Paying Living Expenses: Not on file  Food Insecurity:   . Worried About Charity fundraiser in the Last Year: Not on file  . Ran Out of Food in the Last Year: Not on file  Transportation Needs:   . Lack of Transportation (Medical): Not on file  . Lack of Transportation (Non-Medical): Not on file  Physical Activity:   . Days of Exercise per Week: Not on file  . Minutes of Exercise per Session: Not on file  Stress:   . Feeling of Stress : Not on file  Social Connections:   . Frequency of Communication with Friends and Family: Not on file  . Frequency of Social Gatherings with Friends and Family: Not on file  . Attends Religious Services: Not on file  . Active Member of Clubs or Organizations: Not on file  . Attends Archivist Meetings: Not on file  . Marital Status: Not on file     Family History: The patient's family history includes Alzheimer's disease in her mother; Aneurysm in her sister; Heart attack in her father.    ROS:   Please see the history of present illness.    All other systems reviewed and are negative.  EKGs/Labs/Other Studies Reviewed:    The following studies were reviewed  today:  LEFT HEART CATH AND CORONARY ANGIOGRAPHY 03/21/2020  Conclusion    Previously placed Prox RCA to Mid RCA stent (unknown type) is widely patent.  Previously placed Dist RCA stent (unknown type) is widely patent.  Mid LAD lesion is 70% stenosed.  IMPRESSION: Ms. Herbst has patent stents in her mid and distal RCA.  Her mid LAD disease is unchanged since her last cath in June 2019.  Her anatomy is for all intents and purposes unchanged.  Given the pleuritic nature of her symptoms and the fact that her enzymes  are negative I favor conservative/medical therapy.  The sheath was removed and a TR band was placed on the right wrist to achieve patent hemostasis.  The patient left the lab in stable condition.  Dr. Johnsie Cancel, her attending cardiologist, is aware of her catheterization results.  Diagnostic Dominance: Right     Echo 03/22/20: 1. Left ventricular ejection fraction, by estimation, is 35 to 40%. The  left ventricle has moderately decreased function. The left ventricle  demonstrates regional wall motion abnormalities. Abnormal (paradoxical)  septal motion, consistent with left  bundle branch block. Inferior hypokinesis. There is moderate asymmetric  left ventricular hypertrophy of the basal-septal segment. Left ventricular  diastolic parameters are consistent with Grade II diastolic dysfunction  (pseudonormalization). Elevated  left atrial pressure.  2. Right ventricular systolic function is normal. The right ventricular  size is normal. There is normal pulmonary artery systolic pressure. The  estimated right ventricular systolic pressure is 94.8 mmHg.  3. The mitral valve is abnormal. Mild mitral valve regurgitation.  Moderate mitral annular calcification.  4. The aortic valve was not well visualized. Aortic valve regurgitation  is mild to moderate. Mild to moderate aortic valve sclerosis/calcification  is present, without any evidence of aortic stenosis.  5. The  inferior vena cava is normal in size with greater than 50%  respiratory variability, suggesting right atrial pressure of 3 mmHg.   EKG:  EKG is not ordered today.    Recent Labs: 03/21/2020: B Natriuretic Peptide 282.7; Hemoglobin 10.3; Magnesium 1.9; Platelets 166 03/22/2020: BUN 14; Creatinine, Ser 0.93; Potassium 3.6; Sodium 135  Recent Lipid Panel    Component Value Date/Time   CHOL 109 03/21/2020 0856   TRIG 38 03/21/2020 0856   HDL 57 03/21/2020 0856   CHOLHDL 1.9 03/21/2020 0856   VLDL 8 03/21/2020 0856   LDLCALC 44 03/21/2020 0856    Physical Exam:    VS:  BP 110/82   Pulse 74   Ht 5' 3.5" (1.613 m)   Wt 134 lb (60.8 kg)   SpO2 97%   BMI 23.36 kg/m     Wt Readings from Last 3 Encounters:  03/26/20 134 lb (60.8 kg)  03/20/20 136 lb (61.7 kg)  06/15/19 136 lb 1.9 oz (61.7 kg)     GEN:  Well nourished, well developed in no acute distress HEENT: Normal NECK: No JVD; No carotid bruits LYMPHATICS: No lymphadenopathy CARDIAC: RRR, no murmurs, rubs, gallops RESPIRATORY:  Clear to auscultation without rales, wheezing or rhonchi  ABDOMEN: Soft, non-tender, non-distended MUSCULOSKELETAL:  No edema; No deformity  SKIN: Warm and dry NEUROLOGIC:  Alert and oriented x 3 PSYCHIATRIC:  Normal affect   ASSESSMENT AND PLAN:    1. Post Covid booster vaccine pericarditis -Elevated sed rate and CRP.  Cardiac catheterization without change in anatomy.  Seen in ER started her colchicine at home.  She is back to her baseline without recurrence of chest pain or shortness of breath.  Advised her to not to start colchicine.  Follow symptoms if recurrent, she will start her colchicine back for 1 month.  2.  CAD s/p RCA stent -Recent cardiac catheterization as above. -Continue aspirin, Plavix, statin and beta-blocker  3.  Chronic combined CHF - No HF symptoms. Continue Coreg.   4.  Hyperlipidemia -Continue statin.  Medication Adjustments/Labs and Tests Ordered: Current  medicines are reviewed at length with the patient today.  Concerns regarding medicines are outlined above.  No orders of the defined types were placed in this encounter.  No  orders of the defined types were placed in this encounter.   Patient Instructions  Medication Instructions:  1.  START the Colchicine and take as directed   *If you need a refill on your cardiac medications before your next appointment, please call your pharmacy*   Lab Work: None ordered  If you have labs (blood work) drawn today and your tests are completely normal, you will receive your results only by: Marland Kitchen MyChart Message (if you have MyChart) OR . A paper copy in the mail If you have any lab test that is abnormal or we need to change your treatment, we will call you to review the results.   Testing/Procedures: None ordered   Follow-Up: At [facility], you and your health needs are our priority.  As part of our continuing mission to provide you with exceptional heart care, we have created designated Provider Care Teams.  These Care Teams include your primary Cardiologist (physician) and Advanced Practice Providers (APPs -  Physician Assistants and Nurse Practitioners) who all work together to provide you with the care you need, when you need it.  We recommend signing up for the patient portal called "MyChart".  Sign up information is provided on this After Visit Summary.  MyChart is used to connect with patients for Virtual Visits (Telemedicine).  Patients are able to view lab/test results, encounter notes, upcoming appointments, etc.  Non-urgent messages can be sent to your provider as well.   To learn more about what you can do with MyChart, go to [email or web address].    Your next appointment:   Keep your appointment as scheduled for 06/17/20 at 8:20  The format for your next appointment:   In Person  Provider:   Mertie Moores, MD   Other Instructions      Signed, Leanor Kail, PA   03/26/2020 8:16 AM    [place]

## 2020-03-26 NOTE — Patient Instructions (Addendum)
Medication Instructions:  1.  Do not start the Colchicine  *If you need a refill on your cardiac medications before your next appointment, please call your pharmacy*   Lab Work: None ordered  If you have labs (blood work) drawn today and your tests are completely normal, you will receive your results only by: Marland Kitchen MyChart Message (if you have MyChart) OR . A paper copy in the mail If you have any lab test that is abnormal or we need to change your treatment, we will call you to review the results.   Testing/Procedures: None ordered   Follow-Up: At [facility], you and your health needs are our priority.  As part of our continuing mission to provide you with exceptional heart care, we have created designated Provider Care Teams.  These Care Teams include your primary Cardiologist (physician) and Advanced Practice Providers (APPs -  Physician Assistants and Nurse Practitioners) who all work together to provide you with the care you need, when you need it.  We recommend signing up for the patient portal called "MyChart".  Sign up information is provided on this After Visit Summary.  MyChart is used to connect with patients for Virtual Visits (Telemedicine).  Patients are able to view lab/test results, encounter notes, upcoming appointments, etc.  Non-urgent messages can be sent to your provider as well.   To learn more about what you can do with MyChart, go to [email or web address].    Your next appointment:   6 Months  The format for your next appointment:   In Person  Provider:   Mertie Moores, MD   Other Instructions

## 2020-04-08 ENCOUNTER — Other Ambulatory Visit: Payer: Self-pay | Admitting: Cardiovascular Disease

## 2020-04-18 DIAGNOSIS — Z1231 Encounter for screening mammogram for malignant neoplasm of breast: Secondary | ICD-10-CM | POA: Diagnosis not present

## 2020-05-23 ENCOUNTER — Other Ambulatory Visit: Payer: Self-pay | Admitting: Cardiovascular Disease

## 2020-06-06 ENCOUNTER — Ambulatory Visit (HOSPITAL_COMMUNITY)
Admission: RE | Admit: 2020-06-06 | Payer: Medicare Other | Source: Ambulatory Visit | Attending: Cardiovascular Disease | Admitting: Cardiovascular Disease

## 2020-06-17 ENCOUNTER — Ambulatory Visit: Payer: Medicare Other | Admitting: Cardiovascular Disease

## 2020-06-18 DIAGNOSIS — H353131 Nonexudative age-related macular degeneration, bilateral, early dry stage: Secondary | ICD-10-CM | POA: Diagnosis not present

## 2020-06-18 DIAGNOSIS — H43813 Vitreous degeneration, bilateral: Secondary | ICD-10-CM | POA: Diagnosis not present

## 2020-06-18 DIAGNOSIS — Z961 Presence of intraocular lens: Secondary | ICD-10-CM | POA: Diagnosis not present

## 2020-08-12 ENCOUNTER — Other Ambulatory Visit: Payer: Self-pay | Admitting: Cardiovascular Disease

## 2020-08-12 DIAGNOSIS — E559 Vitamin D deficiency, unspecified: Secondary | ICD-10-CM | POA: Diagnosis not present

## 2020-08-12 DIAGNOSIS — E538 Deficiency of other specified B group vitamins: Secondary | ICD-10-CM | POA: Diagnosis not present

## 2020-08-12 DIAGNOSIS — E785 Hyperlipidemia, unspecified: Secondary | ICD-10-CM | POA: Diagnosis not present

## 2020-08-12 DIAGNOSIS — E039 Hypothyroidism, unspecified: Secondary | ICD-10-CM | POA: Diagnosis not present

## 2020-08-22 DIAGNOSIS — E538 Deficiency of other specified B group vitamins: Secondary | ICD-10-CM | POA: Diagnosis not present

## 2020-08-22 DIAGNOSIS — E559 Vitamin D deficiency, unspecified: Secondary | ICD-10-CM | POA: Diagnosis not present

## 2020-08-22 DIAGNOSIS — R82998 Other abnormal findings in urine: Secondary | ICD-10-CM | POA: Diagnosis not present

## 2020-08-22 DIAGNOSIS — Z0000 Encounter for general adult medical examination without abnormal findings: Secondary | ICD-10-CM | POA: Diagnosis not present

## 2020-08-22 DIAGNOSIS — E039 Hypothyroidism, unspecified: Secondary | ICD-10-CM | POA: Diagnosis not present

## 2020-08-29 DIAGNOSIS — M810 Age-related osteoporosis without current pathological fracture: Secondary | ICD-10-CM | POA: Diagnosis not present

## 2020-08-31 ENCOUNTER — Other Ambulatory Visit: Payer: Self-pay | Admitting: Cardiovascular Disease

## 2020-09-17 ENCOUNTER — Other Ambulatory Visit (HOSPITAL_COMMUNITY): Payer: Self-pay | Admitting: *Deleted

## 2020-09-18 ENCOUNTER — Other Ambulatory Visit: Payer: Self-pay

## 2020-09-18 ENCOUNTER — Ambulatory Visit (HOSPITAL_COMMUNITY)
Admission: RE | Admit: 2020-09-18 | Discharge: 2020-09-18 | Disposition: A | Discharge status: Home / Self Care | Payer: Medicare Other | Source: Ambulatory Visit | Attending: Internal Medicine | Admitting: Internal Medicine

## 2020-09-18 DIAGNOSIS — M810 Age-related osteoporosis without current pathological fracture: Secondary | ICD-10-CM | POA: Insufficient documentation

## 2020-09-18 MED ORDER — DENOSUMAB 60 MG/ML ~~LOC~~ SOSY
60.0000 mg | PREFILLED_SYRINGE | Freq: Once | SUBCUTANEOUS | Status: AC
  Administered 2020-09-18: 60 mg via SUBCUTANEOUS

## 2020-09-18 MED ORDER — DENOSUMAB 60 MG/ML ~~LOC~~ SOSY
PREFILLED_SYRINGE | SUBCUTANEOUS | Status: AC
  Filled 2020-09-18: qty 1

## 2020-10-03 ENCOUNTER — Encounter: Payer: Self-pay | Admitting: Cardiovascular Disease

## 2020-10-03 NOTE — Progress Notes (Signed)
Cardiology Office Note   Date:  10/04/2020   ID:  Susan [name], [place] 05/06/1933, MRN [id number]  PCP:  Crist Infante, MD  Cardiologist:   Mertie Moores, MD   Chief Complaint  Patient presents with  . Coronary Artery Disease   1. Coronary artery disease-status post PTCA and stenting of her right coronary artery. We placed a 2.75 x 16 mm Taxus stent deployed at 18 atmospheres. She also has a moderate proximal LAD stenosis. 2. Hypothyroidism 3. Hyperlipidemia-   Previous Notes:    Susan [name] is a 87 yo with a hx of CAD. She is still playing tennis regularly. She has not had any angina. She has some DOE.   October 26, 2012:  Susan [name] is doing well. She is still playing lots of tennis. No CP. She has had a lifeline screening - her carotid arteries are normal. She denies episodes of chest pain or shortness of breath.  October 11, 2013:  Susan [name] is not feeling well today. "Feels like she is jumping out of her skin" Got back from a trip , she found out that a friend had died.   December 27, 2013:  Susan [name] is doing ok. She has had some eye problems. Her eyesite is ok.  No angina. She does have some mild dyspnea if she climbs 3-4 flights of stairs. Still playing tennis regularly.    July 18, 2014:  Susan [name] is a 87 y.o. female who presents for follow up for her CAD. Just got back from a mission trip in [place]. Did lots of painting and cleaning. No angina or dyspnea.   Is not playing as much tennis - her tennis team has moved on.  2 of her partners have died , 1 is too unstable on her feet.   July 25, 2015:  Doing well.  Very active in her garden .  Plays tennis regularly . Plays on soft courts because of her back and knee pain .  July 28, 2016:  Susan [name] is seen today  [facility] reveals Chol = 155 Trigs = 113 HDL = 59 LDL = 73  She is upset today .   Her grandson OD'd on cough syrup He is doing to a rehab place today    Saw [name], NP on March 5 for palpitations.  Walking some.   Looking forward to getting back into tennis . No CP,  Palpitations have resolved.   Nov. 8, 2018  Doing well Still playing tennis  Plays on soft courts  No CP or dyspnea  Has occasional episodes of near syncope .  Most of the time when she is sitting down.   Last for a second or so.   Feels fine right after the episode  Has had 2 episodes over the past 6 months .   Did not pass out  Sep 22, 2017:  Doing well.   Not playing as much tennis now - most of her tennis friends have died. .    Has occasional DOE while playing tennis for a long point or walking up a hill.  No real chest pain   October 28, 2017:  Susan [name] is seen back today for follow-up visit.  She has a history of coronary artery disease and coronary stenting.  She was having some episodes of chest pain when I last saw her in May.  Repeat heart catheterization revealed a moderate to severe mid LAD stenosis of 60 to 70%.  This is  slightly worse than it was in her heart catheterization in 2012.  She was found to have severely reduced left ventricular systolic function.  She was started on carvedilol 3.125 mg twice a day.  Feb. 11, 2021:  Susan [name] is seen today for a follow-up visit.  She has a history of chronic systolic congestive heart failure.  Her ejection fraction was 30 to 35%.  Echocardiogram performed in July, 2020 revealed a slightly improved LV function of 40 to 45%.  She does have some dyssynergy due to her left bundle branch block.  She is admitted to [facility] in July, 2020 with near syncope and symptoms consistent with orthostatic hypotension.  She had had little to eat that day and was rushing around all day.  She fell and fractured her clavicle.  She has had a few other episodes of orthostasis .  She has warning of a few seconds.  She cut her Coreg to 3. 125 a day due to orthostasis .  She cannot tel if her symptoms are better but she hasnt  fallen.     October 04, 2020: Susan [name] is seen today for follow up of her CAD  has not been playing tennis following a fall several years ago   Seems to be doing well .    Still falls on rare occasion .   Is walking on occasion  Works in her yard.  Does not need a walker or cane at this point.   Is taking her carvedilol only 1 time a day .   Past Medical History:  Diagnosis Date  . Coronary artery disease    POST PTCA AND STENTING OF HER RIGHT CORONARY ARTERY  . Hypothyroidism   . LAD stenosis    MODERATE 50-60% STENOSIS  . Osteoporosis     Past Surgical History:  Procedure Laterality Date  . APPENDECTOMY  1970s?   "ruptured"   . BACK SURGERY    . CATARACT EXTRACTION W/ INTRAOCULAR LENS  IMPLANT, BILATERAL Bilateral   . CORONARY ANGIOPLASTY WITH STENT PLACEMENT  ~ 2009   "2 stents"  . FRACTURE SURGERY    . IR VERTEBROPLASTY CERV/THOR BX INC UNI/BIL INC/INJECT/IMAGING  02/07/2018  . KNEE ARTHROSCOPY Right     torn meniscus  . LEFT HEART CATH AND CORONARY ANGIOGRAPHY N/A 03/21/2020   Procedure: LEFT HEART CATH AND CORONARY ANGIOGRAPHY;  Surgeon: Lorretta Harp, MD;  Location: [place] [facility];  Service: Cardiovascular;  Laterality: N/A;  . [place]  . OVARIAN CYST SURGERY  1970s?   "ruptured"   . RIGHT/LEFT HEART CATH AND CORONARY ANGIOGRAPHY N/A 10/08/2017   Procedure: RIGHT/LEFT HEART CATH AND CORONARY ANGIOGRAPHY;  Surgeon: Nelva Bush, MD;  Location: [place] [facility];  Service: Cardiovascular;  Laterality: N/A;  . WRIST FRACTURE SURGERY Bilateral    "2 surgeries for 2 breaks on left side; 1 surgery for 1 break on right wrist"     Current Outpatient Medications  Medication Sig Dispense Refill  . acetaminophen (TYLENOL) 325 MG tablet Take 2 tablets (650 mg total) by mouth every 6 (six) hours.    . Calcium Carbonate-Vitamin D (CALCIUM + D PO) Take 2 tablets by mouth at bedtime.    . carvedilol (COREG) 3.125 MG tablet Take 1 tablet by mouth  twice daily 180 tablet 0  . cholecalciferol (VITAMIN D3) 25 MCG (1000 UT) tablet Take 1,000 Units by mouth daily after breakfast.    . clopidogrel (PLAVIX) 75 MG tablet Take 1 tablet by  mouth once daily 90 tablet 0  . levothyroxine (SYNTHROID, LEVOTHROID) 75 MCG tablet Take 75 mcg by mouth daily before breakfast.   0  . multivitamin-lutein (OCUVITE-LUTEIN) CAPS capsule Take 1 capsule by mouth 2 (two) times a day.     . nitroGLYCERIN (NITROSTAT) 0.4 MG SL tablet Place 1 tablet (0.4 mg total) under the tongue every 5 (five) minutes as needed. 25 tablet 3  . Omega-3 Fatty Acids (FISH OIL) 1200 MG CAPS Take 1,200 mg by mouth at bedtime.    . rosuvastatin (CRESTOR) 40 MG tablet Take 1 tablet by mouth once daily 90 tablet 2  . venlafaxine XR (EFFEXOR-XR) 150 MG 24 hr capsule Take 1 capsule by mouth once daily 90 capsule 3  . vitamin B-12 (CYANOCOBALAMIN) 1000 MCG tablet Take 2,000 mcg by mouth daily.      No current facility-administered medications for this visit.    Allergies:   Patient has no known allergies.    Social History:  The patient  reports that she has never smoked. She has never used smokeless tobacco. She reports current alcohol use of about 1.0 standard drink of alcohol per week. She reports that she does not use drugs.   Family History:  The patient's family history includes Alzheimer's disease in her mother; Aneurysm in her sister; Heart attack in her father.    ROS:    Noted in current history, otherwise review of systems is negative.  Physical Exam: Blood pressure 108/66, pulse 91, height 5\' 3"  (1.6 m), weight 136 lb 9.6 oz (62 kg), SpO2 96 %.  GEN:  Well nourished, well developed in no acute distress HEENT: Normal NECK: No JVD; No carotid bruits LYMPHATICS: No lymphadenopathy CARDIAC: RRR , no murmurs, rubs, gallops RESPIRATORY:  Clear to auscultation without rales, wheezing or rhonchi  ABDOMEN: Soft, non-tender, non-distended MUSCULOSKELETAL:  No edema; No deformity   SKIN: Warm and dry NEUROLOGIC:  Alert and oriented x 3    EKG:        Recent Labs: 03/21/2020: B Natriuretic Peptide 282.7; Hemoglobin 10.3; Magnesium 1.9; Platelets 166 03/22/2020: BUN 14; Creatinine, Ser 0.93; Potassium 3.6; Sodium 135    Lipid Panel    Component Value Date/Time   CHOL 109 03/21/2020 0856   TRIG 38 03/21/2020 0856   HDL 57 03/21/2020 0856   CHOLHDL 1.9 03/21/2020 0856   VLDL 8 03/21/2020 0856   LDLCALC 44 03/21/2020 0856      Wt Readings from Last 3 Encounters:  10/04/20 136 lb 9.6 oz (62 kg)  03/26/20 134 lb (60.8 kg)  03/20/20 136 lb (61.7 kg)      Other studies Reviewed: Additional studies/ records that were reviewed today include: . Review of the above records demonstrates:    ASSESSMENT AND PLAN:  1. Coronary artery disease-status post PTCA and stenting of her right coronary artery in 2005 . We placed a 2.75 x 16 mm Taxus stent deployed at 18 atmospheres.   No recent angina    .  2.  Chronic systolic CHF:   - EF 08-65%.   Has a LBBB   .  Is taking her coreg only once daily Will change to toprol XL 12.5 mg a day  Cont other meds    3.  Carotid artery disease:   Mis minimal carotid disease.  Is asymptomatic. Anticiparte repeating in several years.  Cont lipid  Management per dr. Joylene Draft   3. Hyperlipidemia-      Current medicines are reviewed at length with the  patient today.  The patient does not have concerns regarding medicines.  The following changes have been made:  no change  Labs/ tests ordered today include:   No orders of the defined types were placed in this encounter. - Carotid duplex scan      Signed, Mertie Moores, MD  10/04/2020 10:00 AM    [address], [place], [place]  [address] Phone: [phone]; Fax: [phone]

## 2020-10-04 ENCOUNTER — Other Ambulatory Visit: Payer: Self-pay

## 2020-10-04 ENCOUNTER — Ambulatory Visit: Payer: Medicare Other | Admitting: Cardiovascular Disease

## 2020-10-04 ENCOUNTER — Encounter: Payer: Self-pay | Admitting: Cardiovascular Disease

## 2020-10-04 VITALS — BP 108/66 | HR 91 | Ht 63.0 in | Wt 136.6 lb

## 2020-10-04 DIAGNOSIS — I2510 Atherosclerotic heart disease of native coronary artery without angina pectoris: Secondary | ICD-10-CM

## 2020-10-04 DIAGNOSIS — I5022 Chronic systolic (congestive) heart failure: Secondary | ICD-10-CM

## 2020-10-04 MED ORDER — METOPROLOL SUCCINATE ER 25 MG PO TB24: 12.5000 mg | ORAL_TABLET | Freq: Every day | ORAL | 3 refills | Status: DC

## 2020-10-04 NOTE — Patient Instructions (Signed)
Medication Instructions:  1) DISCONTINUE Carvedilol 2) START Metoprolol Succinate 12.5mg  once daily  *If you need a refill on your cardiac medications before your next appointment, please call your pharmacy*   Lab Work: None If you have labs (blood work) drawn today and your tests are completely normal, you will receive your results only by: Marland Kitchen MyChart Message (if you have MyChart) OR . A paper copy in the mail If you have any lab test that is abnormal or we need to change your treatment, we will call you to review the results.   Testing/Procedures: None   Follow-Up: At [facility], you and your health needs are our priority.  As part of our continuing mission to provide you with exceptional heart care, we have created designated Provider Care Teams.  These Care Teams include your primary Cardiologist (physician) and Advanced Practice Providers (APPs -  Physician Assistants and Nurse Practitioners) who all work together to provide you with the care you need, when you need it.  We recommend signing up for the patient portal called "MyChart".  Sign up information is provided on this After Visit Summary.  MyChart is used to connect with patients for Virtual Visits (Telemedicine).  Patients are able to view lab/test results, encounter notes, upcoming appointments, etc.  Non-urgent messages can be sent to your provider as well.   To learn more about what you can do with MyChart, go to [email or web address].    Your next appointment:   1 year(s)  The format for your next appointment:   In Person  Provider:   You may see Mertie Moores, MD or one of the following Advanced Practice Providers on your designated Care Team:    Richardson Dopp, PA-C  Robbie Lis, [place]    Other Instructions

## 2020-10-17 DIAGNOSIS — H524 Presbyopia: Secondary | ICD-10-CM | POA: Diagnosis not present

## 2020-10-17 DIAGNOSIS — H353132 Nonexudative age-related macular degeneration, bilateral, intermediate dry stage: Secondary | ICD-10-CM | POA: Diagnosis not present

## 2020-10-17 DIAGNOSIS — Z961 Presence of intraocular lens: Secondary | ICD-10-CM | POA: Diagnosis not present

## 2020-10-18 DIAGNOSIS — R69 Illness, unspecified: Secondary | ICD-10-CM | POA: Diagnosis not present

## 2020-12-14 ENCOUNTER — Other Ambulatory Visit: Payer: Self-pay | Admitting: Cardiovascular Disease

## 2021-03-21 DIAGNOSIS — H43813 Vitreous degeneration, bilateral: Secondary | ICD-10-CM | POA: Diagnosis not present

## 2021-03-21 DIAGNOSIS — H353131 Nonexudative age-related macular degeneration, bilateral, early dry stage: Secondary | ICD-10-CM | POA: Diagnosis not present

## 2021-03-21 DIAGNOSIS — Z961 Presence of intraocular lens: Secondary | ICD-10-CM | POA: Diagnosis not present

## 2021-04-05 ENCOUNTER — Other Ambulatory Visit: Payer: Self-pay | Admitting: Cardiovascular Disease

## 2021-04-24 DIAGNOSIS — M810 Age-related osteoporosis without current pathological fracture: Secondary | ICD-10-CM | POA: Diagnosis not present

## 2021-04-24 DIAGNOSIS — E039 Hypothyroidism, unspecified: Secondary | ICD-10-CM | POA: Diagnosis not present

## 2021-04-24 DIAGNOSIS — E538 Deficiency of other specified B group vitamins: Secondary | ICD-10-CM | POA: Diagnosis not present

## 2021-04-24 DIAGNOSIS — I2510 Atherosclerotic heart disease of native coronary artery without angina pectoris: Secondary | ICD-10-CM | POA: Diagnosis not present

## 2021-05-02 DIAGNOSIS — Z1231 Encounter for screening mammogram for malignant neoplasm of breast: Secondary | ICD-10-CM | POA: Diagnosis not present

## 2021-05-19 ENCOUNTER — Other Ambulatory Visit (HOSPITAL_COMMUNITY): Payer: Self-pay | Admitting: *Deleted

## 2021-05-20 ENCOUNTER — Encounter (HOSPITAL_COMMUNITY): Payer: Medicare Other

## 2021-05-27 ENCOUNTER — Other Ambulatory Visit (HOSPITAL_COMMUNITY): Payer: Self-pay

## 2021-05-28 ENCOUNTER — Ambulatory Visit (HOSPITAL_COMMUNITY)
Admission: RE | Admit: 2021-05-28 | Discharge: 2021-05-28 | Disposition: A | Discharge status: Home / Self Care | Payer: Medicare Other | Source: Ambulatory Visit | Attending: Internal Medicine | Admitting: Internal Medicine

## 2021-05-28 ENCOUNTER — Other Ambulatory Visit: Payer: Self-pay

## 2021-05-28 DIAGNOSIS — M810 Age-related osteoporosis without current pathological fracture: Secondary | ICD-10-CM | POA: Insufficient documentation

## 2021-05-28 MED ORDER — DENOSUMAB 60 MG/ML ~~LOC~~ SOSY
PREFILLED_SYRINGE | SUBCUTANEOUS | Status: AC
  Administered 2021-05-28: 14:00:00 60 mg via SUBCUTANEOUS
  Filled 2021-05-28: qty 1

## 2021-05-28 MED ORDER — DENOSUMAB 60 MG/ML ~~LOC~~ SOSY: 60.0000 mg | PREFILLED_SYRINGE | Freq: Once | SUBCUTANEOUS | Status: AC

## 2021-07-01 ENCOUNTER — Other Ambulatory Visit: Payer: Self-pay | Admitting: Cardiovascular Disease

## 2021-07-14 ENCOUNTER — Other Ambulatory Visit: Payer: Self-pay | Admitting: Cardiovascular Disease

## 2021-08-22 ENCOUNTER — Telehealth: Payer: Self-pay | Admitting: Cardiovascular Disease

## 2021-08-22 MED ORDER — PANTOPRAZOLE SODIUM 40 MG PO TBEC: 40.0000 mg | DELAYED_RELEASE_TABLET | Freq: Every day | ORAL | 3 refills | Status: DC

## 2021-08-22 NOTE — Telephone Encounter (Signed)
Pt states that she has had 2 episodes of what she thinks is intergeston. Pt states that it's not CP but a different feeling. Pt would like a callback. Please advise ?

## 2021-08-22 NOTE — Telephone Encounter (Signed)
Called and spoke with patient who condones episode of chest "sort of pressure" that occurred once approx 3 weeks ago and again on 4/19 (Wednesday). States pain is non-radiating, no associated n/v/diaphoresis. Denies that this was a real chest pain, but does state she has indigestion and it seemed worse than usual. Pt states she propped up on a pillow and it eventually eased and she went off to sleep. Questioned pt about what she had last eaten and she had spaghetti and garlic bread for supper. She knows that this was likely the cause and has had no issues since. Pt said she purchased 14 days of Omeprazole OTC, but has run out and didn't know if she should be taking it. Informed pt that there is a major interaction between her Plavix and Omeprazole and that she should not take this, but we can see if MD is okay prescribing an alternate PPI. Pt understands.  ?

## 2021-08-22 NOTE — Telephone Encounter (Signed)
Nahser, Wonda Cheng, MD sent to Donnalee Curry K ?Caller: Unspecified (Today,  2:17 PM) ?Agree with note by Donnalee Curry, RN  ?Lets DC omeprazole ( she is on plavix )  ?Start protonix 40 mg a day  ? ?Encourage her to limite her greasy foods  to minimize her GERD symptoms  ? ?Left patient a message that medication was sent to pharmacy on file for what we discussed earlier and that if she had any questions to call office. ?

## 2021-09-05 DIAGNOSIS — E039 Hypothyroidism, unspecified: Secondary | ICD-10-CM | POA: Diagnosis not present

## 2021-09-05 DIAGNOSIS — E538 Deficiency of other specified B group vitamins: Secondary | ICD-10-CM | POA: Diagnosis not present

## 2021-09-05 DIAGNOSIS — E785 Hyperlipidemia, unspecified: Secondary | ICD-10-CM | POA: Diagnosis not present

## 2021-09-05 DIAGNOSIS — E559 Vitamin D deficiency, unspecified: Secondary | ICD-10-CM | POA: Diagnosis not present

## 2021-09-12 DIAGNOSIS — E785 Hyperlipidemia, unspecified: Secondary | ICD-10-CM | POA: Diagnosis not present

## 2021-09-12 DIAGNOSIS — I2510 Atherosclerotic heart disease of native coronary artery without angina pectoris: Secondary | ICD-10-CM | POA: Diagnosis not present

## 2021-09-12 DIAGNOSIS — Z0000 Encounter for general adult medical examination without abnormal findings: Secondary | ICD-10-CM | POA: Diagnosis not present

## 2021-09-12 DIAGNOSIS — R82998 Other abnormal findings in urine: Secondary | ICD-10-CM | POA: Diagnosis not present

## 2021-09-12 DIAGNOSIS — E039 Hypothyroidism, unspecified: Secondary | ICD-10-CM | POA: Diagnosis not present

## 2021-09-16 ENCOUNTER — Other Ambulatory Visit: Payer: Self-pay

## 2021-09-16 DIAGNOSIS — M810 Age-related osteoporosis without current pathological fracture: Secondary | ICD-10-CM | POA: Insufficient documentation

## 2021-10-06 ENCOUNTER — Ambulatory Visit: Payer: Medicare Other | Admitting: Cardiovascular Disease

## 2021-10-06 ENCOUNTER — Encounter: Payer: Self-pay | Admitting: Cardiovascular Disease

## 2021-10-06 VITALS — BP 108/70 | HR 70 | Ht 63.0 in | Wt 130.4 lb

## 2021-10-06 DIAGNOSIS — I5022 Chronic systolic (congestive) heart failure: Secondary | ICD-10-CM

## 2021-10-06 DIAGNOSIS — I5042 Chronic combined systolic (congestive) and diastolic (congestive) heart failure: Secondary | ICD-10-CM

## 2021-10-06 DIAGNOSIS — I2510 Atherosclerotic heart disease of native coronary artery without angina pectoris: Secondary | ICD-10-CM | POA: Diagnosis not present

## 2021-10-06 NOTE — Progress Notes (Signed)
Cardiology Office Note   Date:  10/06/2021   ID:  Susan [name], Susan [name] [birth year], MRN [id number]  PCP:  Crist Infante, MD  Cardiologist:   Mertie Moores, MD   Chief Complaint  Patient presents with   Coronary Artery Disease        1. Coronary artery disease-status post PTCA and stenting of her right coronary artery. We placed a 2.75 x 16 mm Taxus stent deployed at 18 atmospheres. She also has a moderate proximal LAD stenosis. 2. Hypothyroidism 3. Hyperlipidemia-   Previous Notes:     Susan [name] is a 88 yo with a hx of CAD.  She is still playing tennis regularly.  She has not had any angina.  She has some DOE.     October 26, 2012:  Susan [name] is doing well.  She is still playing lots of tennis.  No CP.  She has had a lifeline screening - her carotid arteries are normal.  She denies episodes of chest pain or shortness of breath.  October 11, 2013:  Susan [name] is not feeling well today. "Feels like she is jumping out of her skin" Got back from a trip , she found out that a friend had died.   Jan 06, 2014:  Susan [name] is doing ok.  She has had some eye problems.  Her eyesite is ok.    No angina.   She does have some mild dyspnea if she climbs 3-4 flights of stairs.  Still playing tennis regularly.    July 18, 2014:  Susan [name] is a 88 y.o. female who presents for follow up for her CAD. Just got back from a mission trip in [place]. Did lots of painting and cleaning. No angina or dyspnea.   Is not playing as much tennis - her tennis team has moved on.  2 of her partners have died , 1 is too unstable on her feet.   July 25, 2015:  Doing well.  Very active in her garden .  Plays tennis regularly . Plays on soft courts because of her back and knee pain .  July 28, 2016:  Susan [name] is seen today  Recent labs at [facility] reveals Chol = 155 Trigs = 113 HDL = 59 LDL = 73  She is upset today .   Her grandson OD'd on cough syrup He is doing to a rehab place  today   Saw Susan Kicks, NP on March 5 for palpitations.  Walking some.   Looking forward to getting back into tennis . No CP,  Palpitations have resolved.   Nov. 8, 2018  Doing well Still playing tennis  Plays on soft courts  No CP or dyspnea  Has occasional episodes of near syncope .  Most of the time when she is sitting down.   Last for a second or so.   Feels fine right after the episode  Has had 2 episodes over the past 6 months .   Did not pass out  Sep 22, 2017:  Doing well.   Not playing as much tennis now - most of her tennis friends have died. .    Has occasional DOE while playing tennis for a long point or walking up a hill.  No real chest pain   October 28, 2017:  Susan [name] is seen back today for follow-up visit.  She has a history of coronary artery disease and coronary stenting.  She was having some episodes of chest pain when I last saw  her in May.  Repeat heart catheterization revealed a moderate to severe mid LAD stenosis of 60 to 70%.  This is slightly worse than it was in her heart catheterization in 2012.  She was found to have severely reduced left ventricular systolic function.  She was started on carvedilol 3.125 mg twice a day.  Feb. 11, 2021:  Susan [name] is seen today for a follow-up visit.  She has a history of chronic systolic congestive heart failure.  Her ejection fraction was 30 to 35%.  Echocardiogram performed in July, 2020 revealed a slightly improved LV function of 40 to 45%.  She does have some dyssynergy due to her left bundle branch block.  She is admitted to [facility] in July, 2020 with near syncope and symptoms consistent with orthostatic hypotension.  She had had little to eat that day and was rushing around all day.  She fell and fractured her clavicle.  She has had a few other episodes of orthostasis .  She has warning of a few seconds.  She cut her Coreg to 3. 125 a day due to orthostasis .  She cannot tel if her symptoms are better but she  hasnt fallen.     October 04, 2020: Susan [name] is seen today for follow up of her CAD  has not been playing tennis following a fall several years ago   Seems to be doing well .    Still falls on rare occasion .   Is walking on occasion  Works in her yard.  Does not need a walker or cane at this point.   Is taking her carvedilol only 1 time a day .   October 06, 2021:  Susan [name] is seen today for follow-up visit.  She has a history of coronary artery disease.  She also has a left bundle branch block and has developed congestive heart failure.  Echo 8/19 - EF 30-35% Echo  7/20 - EF 40-455 Echo 11/21 - EF 35-40%, grade II DD   Doing well. Has been having some "different " chest pain . Did not take NTG.  Took an ASA Walks quite a bit.   [place]ots of [place] in her neighborhood.  Has some DOE in the hills.   Does not have CP with walking .  Is not playing tennis ( fell several years ago so has not been playing tennis any longer )   We discussed her low EF .  We will repeat her echocardiogram.  We did discuss the possibility that she may need to be referred to electrophysiology for CRT pacing.    Past Medical History:  Diagnosis Date   Coronary artery disease    POST PTCA AND STENTING OF HER RIGHT CORONARY ARTERY   Hypothyroidism    LAD stenosis    MODERATE 50-60% STENOSIS   Osteoporosis     Past Surgical History:  Procedure Laterality Date   APPENDECTOMY  1970s?   "ruptured"    BACK SURGERY     CATARACT EXTRACTION W/ INTRAOCULAR LENS  IMPLANT, BILATERAL Bilateral    CORONARY ANGIOPLASTY WITH STENT PLACEMENT  ~ 2009   "2 stents"   FRACTURE SURGERY     IR VERTEBROPLASTY CERV/THOR BX INC UNI/BIL INC/INJECT/IMAGING  02/07/2018   KNEE ARTHROSCOPY Right     torn meniscus   LEFT HEART CATH AND CORONARY ANGIOGRAPHY N/A 03/21/2020   Procedure: LEFT HEART CATH AND CORONARY ANGIOGRAPHY;  Surgeon: Lorretta Harp, MD;  Location: [facility];  Service: Cardiovascular;  Laterality: N/A;    LUMBAR LAMINECTOMY  1978   OVARIAN CYST SURGERY  1970s?   "ruptured"    RIGHT/LEFT HEART CATH AND CORONARY ANGIOGRAPHY N/A 10/08/2017   Procedure: RIGHT/LEFT HEART CATH AND CORONARY ANGIOGRAPHY;  Surgeon: Nelva Bush, MD;  Location: [place] [facility];  Service: Cardiovascular;  Laterality: N/A;   WRIST FRACTURE SURGERY Bilateral    "2 surgeries for 2 breaks on left side; 1 surgery for 1 break on right wrist"     Current Outpatient Medications  Medication Sig Dispense Refill   acetaminophen (TYLENOL) 325 MG tablet Take 2 tablets (650 mg total) by mouth every 6 (six) hours.     Calcium Carbonate-Vitamin D (CALCIUM + D PO) Take 2 tablets by mouth at bedtime.     cholecalciferol (VITAMIN D3) 25 MCG (1000 UT) tablet Take 1,000 Units by mouth daily after breakfast.     clopidogrel (PLAVIX) 75 MG tablet Take 1 tablet by mouth once daily 90 tablet 1   levothyroxine (SYNTHROID, LEVOTHROID) 75 MCG tablet Take 75 mcg by mouth daily before breakfast.   0   metoprolol succinate (TOPROL XL) 25 MG 24 hr tablet Take 0.5 tablets (12.5 mg total) by mouth daily. 45 tablet 3   multivitamin-lutein (OCUVITE-LUTEIN) CAPS capsule Take 1 capsule by mouth 2 (two) times a day.      nitroGLYCERIN (NITROSTAT) 0.4 MG SL tablet Place 1 tablet (0.4 mg total) under the tongue every 5 (five) minutes as needed. 25 tablet 3   Omega-3 Fatty Acids (FISH OIL) 1200 MG CAPS Take 1,200 mg by mouth at bedtime.     pantoprazole (PROTONIX) 40 MG tablet Take 1 tablet (40 mg total) by mouth daily. 90 tablet 3   rosuvastatin (CRESTOR) 40 MG tablet Take 1 tablet by mouth once daily 90 tablet 0   venlafaxine XR (EFFEXOR-XR) 150 MG 24 hr capsule Take 1 capsule (150 mg total) by mouth daily. Pt needs to make appt with provider for further refills 30 capsule 2   vitamin B-12 (CYANOCOBALAMIN) 1000 MCG tablet Take 2,000 mcg by mouth daily.      No current facility-administered medications for this visit.    Allergies:   Patient has no  known allergies.    Social History:  The patient  reports that she has never smoked. She has never used smokeless tobacco. She reports current alcohol use of about 1.0 standard drink per week. She reports that she does not use drugs.   Family History:  The patient's family history includes Alzheimer's disease in her mother; Aneurysm in her sister; Heart attack in her father.    ROS:    Noted in current history, otherwise review of systems is negative.  Physical Exam: Blood pressure 108/70, pulse 70, height '5\' 3"'$  (1.6 m), weight 130 lb 6.4 oz (59.1 kg), SpO2 98 %.  GEN:  Well nourished, well developed in no acute distress HEENT: Normal NECK: No JVD; No carotid bruits LYMPHATICS: No lymphadenopathy CARDIAC: RRR , no murmurs, rubs, gallops RESPIRATORY:  Clear to auscultation without rales, wheezing or rhonchi  ABDOMEN: Soft, non-tender, non-distended MUSCULOSKELETAL:  No edema; No deformity  SKIN: Warm and dry NEUROLOGIC:  Alert and oriented x 3   EKG:    October 06, 2021: Normal sinus rhythm at 70.  Left bundle branch block.  No changes from previous EKG.    Recent Labs: No results found for requested labs within last 8760 hours.    Lipid Panel    Component Value Date/Time  CHOL 109 03/21/2020 0856   TRIG 38 03/21/2020 0856   HDL 57 03/21/2020 0856   CHOLHDL 1.9 03/21/2020 0856   VLDL 8 03/21/2020 0856   LDLCALC 44 03/21/2020 0856      Wt Readings from Last 3 Encounters:  10/06/21 130 lb 6.4 oz (59.1 kg)  10/04/20 136 lb 9.6 oz (62 kg)  03/26/20 134 lb (60.8 kg)      Other studies Reviewed: Additional studies/ records that were reviewed today include: . Review of the above records demonstrates:    ASSESSMENT AND PLAN:  1. Coronary artery disease-  no angina.  She does have some shortness of breath when she is climbing hills. I have encouraged her to take a nitroglycerin if she has any recurrent chest discomfort.    .  2.  Chronic systolic CHF:   -She has  persistently low ejection fraction.  Her blood pressure is low so were on fairly maximal medical therapy.  We will repeat her echocardiogram.  If she qualifies, she may be a candidate for CRT pacing.  We discussed that possibility and that we may be referring her to electrophysiology if that is the case.    3.  Carotid artery disease:       3. Hyperlipidemia-managed by her primary medical doctor.  Labs from Dr. Silvestre Mesi  office reveals an LDL of 75.  Her HDL is 46.  Her total cholesterol is 150.  Triglyceride level is 144.     Current medicines are reviewed at length with the patient today.  The patient does not have concerns regarding medicines.  The following changes have been made:  no change  Labs/ tests ordered today include:   No orders of the defined types were placed in this encounter. - Carotid duplex scan      Signed, Mertie Moores, MD  10/06/2021 9:14 AM    [facility] [place], [place], [place]  [address] Phone: [phone]; Fax: [phone]

## 2021-10-06 NOTE — Patient Instructions (Signed)
Medication Instructions:  Your physician recommends that you continue on your current medications as directed. Please refer to the Current Medication list given to you today.  *If you need a refill on your cardiac medications before your next appointment, please call your pharmacy*  Lab Work: NONE  Testing/Procedures: Your physician has requested that you have an echocardiogram. Echocardiography is a painless test that uses sound waves to create images of your heart. It provides your doctor with information about the size and shape of your heart and how well your heart's chambers and valves are working. This procedure takes approximately one hour. There are no restrictions for this procedure.  Follow-Up: At [facility], you and your health needs are our priority.  As part of our continuing mission to provide you with exceptional heart care, we have created designated Provider Care Teams.  These Care Teams include your primary Cardiologist (physician) and Advanced Practice Providers (APPs -  Physician Assistants and Nurse Practitioners) who all work together to provide you with the care you need, when you need it.  Your next appointment:   1 year(s)  The format for your next appointment:   In Person  Provider:   Mertie Moores, MD  or Robbie Lis, PA-C, Christen Bame, NP, or Richardson Dopp, PA-C        Important Information About Sugar

## 2021-10-21 ENCOUNTER — Other Ambulatory Visit: Payer: Self-pay | Admitting: Cardiovascular Disease

## 2021-10-27 ENCOUNTER — Ambulatory Visit (HOSPITAL_COMMUNITY): Payer: Medicare Other | Attending: Internal Medicine

## 2021-10-27 DIAGNOSIS — I5042 Chronic combined systolic (congestive) and diastolic (congestive) heart failure: Secondary | ICD-10-CM | POA: Diagnosis not present

## 2021-10-27 DIAGNOSIS — I5022 Chronic systolic (congestive) heart failure: Secondary | ICD-10-CM | POA: Insufficient documentation

## 2021-10-27 LAB — ECHOCARDIOGRAM COMPLETE
Area-P 1/2: 2.54 cm2
MV M vel: 4.84 m/s
MV Peak grad: 93.7 mmHg
P 1/2 time: 517 msec
Radius: 0.6 cm
S' Lateral: 4.2 cm

## 2021-10-30 ENCOUNTER — Telehealth: Payer: Self-pay

## 2021-10-30 DIAGNOSIS — I5042 Chronic combined systolic (congestive) and diastolic (congestive) heart failure: Secondary | ICD-10-CM

## 2021-10-30 NOTE — Telephone Encounter (Signed)
Called and spoke to patient who understands results and agrees to plan. EP referral placed at this time.

## 2021-10-30 NOTE — Telephone Encounter (Signed)
-----   Message from Thayer Headings, MD sent at 10/28/2021  5:11 PM EDT ----- LV EF is 30-35% She is on maximally tolerated medical therapy for her CHF.  Has LBBB and has symptomatic DOE  Please refer to EF for consideration of a Bi-V pacer

## 2021-11-03 ENCOUNTER — Other Ambulatory Visit: Payer: Self-pay | Admitting: Cardiovascular Disease

## 2021-11-17 ENCOUNTER — Other Ambulatory Visit: Payer: Self-pay | Admitting: Cardiovascular Disease

## 2021-11-18 DIAGNOSIS — R051 Acute cough: Secondary | ICD-10-CM | POA: Diagnosis not present

## 2021-11-18 DIAGNOSIS — J069 Acute upper respiratory infection, unspecified: Secondary | ICD-10-CM | POA: Diagnosis not present

## 2021-11-18 DIAGNOSIS — R55 Syncope and collapse: Secondary | ICD-10-CM | POA: Diagnosis not present

## 2021-11-19 ENCOUNTER — Telehealth: Payer: Self-pay | Admitting: Pharmacy Technician

## 2021-11-19 NOTE — Telephone Encounter (Signed)
[facility]rolia referral cancelled. Patient will receive treatment on 11/27/21 @ [facility].

## 2021-11-20 ENCOUNTER — Other Ambulatory Visit: Payer: Self-pay | Admitting: Pharmacy Technician

## 2021-11-20 ENCOUNTER — Encounter: Payer: Self-pay | Admitting: Internal Medicine

## 2021-11-20 ENCOUNTER — Ambulatory Visit: Payer: Medicare Other | Admitting: Internal Medicine

## 2021-11-20 VITALS — BP 108/64 | HR 74 | Ht 63.0 in | Wt 131.4 lb

## 2021-11-20 DIAGNOSIS — I429 Cardiomyopathy, unspecified: Secondary | ICD-10-CM | POA: Diagnosis not present

## 2021-11-20 DIAGNOSIS — Z79899 Other long term (current) drug therapy: Secondary | ICD-10-CM | POA: Diagnosis not present

## 2021-11-20 DIAGNOSIS — I5022 Chronic systolic (congestive) heart failure: Secondary | ICD-10-CM | POA: Diagnosis not present

## 2021-11-20 MED ORDER — LOSARTAN POTASSIUM 25 MG PO TABS
12.5000 mg | ORAL_TABLET | Freq: Every evening | ORAL | 3 refills | Status: DC
Start: 2021-11-20 — End: 2023-01-18

## 2021-11-20 NOTE — Patient Instructions (Addendum)
Medication Instructions:  Your physician has recommended you make the following change in your medication:   ** Begin Losartan '25mg'$  - 1/2 tablet (12.'5mg'$ ) by mouth nightly  *If you need a refill on your cardiac medications before your next appointment, please call your pharmacy*   Lab Work: BMET in 2 weeks  If you have labs (blood work) drawn today and your tests are completely normal, you will receive your results only by: [facility] (if you have MyChart) OR A paper copy in the mail If you have any lab test that is abnormal or we need to change your treatment, we will call you to review the results.   Testing/Procedures: None ordered.    Follow-Up: At [facility], you and your health needs are our priority.  As part of our continuing mission to provide you with exceptional heart care, we have created designated Provider Care Teams.  These Care Teams include your primary Cardiologist (physician) and Advanced Practice Providers (APPs -  Physician Assistants and Nurse Practitioners) who all work together to provide you with the care you need, when you need it.  We recommend signing up for the patient portal called "MyChart".  Sign up information is provided on this After Visit Summary.  MyChart is used to connect with patients for Virtual Visits (Telemedicine).  Patients are able to view lab/test results, encounter notes, upcoming appointments, etc.  Non-urgent messages can be sent to your provider as well.   To learn more about what you can do with MyChart, go to [email or web address].    Your next appointment:   Follow up with Dr Caryl Comes as needed   Important Information About Sugar

## 2021-11-20 NOTE — Progress Notes (Signed)
ELECTROPHYSIOLOGY CONSULT NOTE  Patient ID: Susan [name], MRN: [id number], DOB/AGE: 1934-02-12 88 y.o. Admit date: (Not on file) Date of Consult: 11/20/2021  Primary Physician: Crist Infante, MD Primary Cardiologist: Lurena Nida     Susan [name] is a 88 y.o. female who is being seen today for the evaluation of possible CRT at the request of PNa .    HPI Susan [name] is a 88 y.o. female referred for consideration of CRT P.  She has a history of remote MI.  Associated with syncope.  Underwent stenting.  Has intercurrently developed worsening left ventricular systolic function notwithstanding patent stents  She has modest exercise tolerance.  She is able to walk 30 to 45 minutes rather briskly including up hills that slows her down a little bit.  She does her shopping.  Garden work and the activities of her life without dyspnea.  Has an isolated episode of syncope as noted above.  No palpitations.  DATE TEST EF   3/13 Echo  50-55%   5/19 Echo  30-35% MR mild  6/19 LHC  LADm-70%; RCAd-stent; RCAp/m-stent patent  11/21 Echo  35-40%   11/21 LHC   % LADm-70%; RCAd-stent; RCAp/m-stent patent  6/23 Echo  30-35% MR mod   Date Cr K Hgb  11/21 0.93 3.6 10.3  5/23   14.8    ECG 6/23 sinus LBBB QRSd 126 ECG 11/21 sinus LBBB QRSd 130 ECG 2/21 sinus LBBB QRSd 126  Past Medical History:  Diagnosis Date   Coronary artery disease    POST PTCA AND STENTING OF HER RIGHT CORONARY ARTERY   Hypothyroidism    LAD stenosis    MODERATE 50-60% STENOSIS   Osteoporosis       Surgical History:  Past Surgical History:  Procedure Laterality Date   APPENDECTOMY  1970s?   "ruptured"    BACK SURGERY     CATARACT EXTRACTION W/ INTRAOCULAR LENS  IMPLANT, BILATERAL Bilateral    CORONARY ANGIOPLASTY WITH STENT PLACEMENT  ~ 2009   "2 stents"   FRACTURE SURGERY     IR VERTEBROPLASTY CERV/THOR BX INC UNI/BIL INC/INJECT/IMAGING  02/07/2018   KNEE ARTHROSCOPY Right     torn meniscus   LEFT  HEART CATH AND CORONARY ANGIOGRAPHY N/A 03/21/2020   Procedure: LEFT HEART CATH AND CORONARY ANGIOGRAPHY;  Surgeon: Lorretta Harp, MD;  Location: [facility];  Service: Cardiovascular;  Laterality: N/A;   LUMBAR LAMINECTOMY  1978   OVARIAN CYST SURGERY  1970s?   "ruptured"    RIGHT/LEFT HEART CATH AND CORONARY ANGIOGRAPHY N/A 10/08/2017   Procedure: RIGHT/LEFT HEART CATH AND CORONARY ANGIOGRAPHY;  Surgeon: Nelva Bush, MD;  Location: [facility];  Service: Cardiovascular;  Laterality: N/A;   WRIST FRACTURE SURGERY Bilateral    "2 surgeries for 2 breaks on left side; 1 surgery for 1 break on right wrist"     Home Meds: Current Meds  Medication Sig   acetaminophen (TYLENOL) 325 MG tablet Take 2 tablets (650 mg total) by mouth every 6 (six) hours.   Calcium Carbonate-Vitamin D (CALCIUM + D PO) Take 2 tablets by mouth at bedtime.   cholecalciferol (VITAMIN D3) 25 MCG (1000 UT) tablet Take 1,000 Units by mouth daily after breakfast.   clopidogrel (PLAVIX) 75 MG tablet Take 1 tablet by mouth once daily   levothyroxine (SYNTHROID, LEVOTHROID) 75 MCG tablet Take 75 mcg by mouth daily before breakfast.    losartan (COZAAR) 25 MG tablet Take 0.5  tablets (12.5 mg total) by mouth at bedtime.   metoprolol succinate (TOPROL-XL) 25 MG 24 hr tablet Take 1/2 (one-half) tablet by mouth once daily   multivitamin-lutein (OCUVITE-LUTEIN) CAPS capsule Take 1 capsule by mouth 2 (two) times a day.    nitroGLYCERIN (NITROSTAT) 0.4 MG SL tablet Place 1 tablet (0.4 mg total) under the tongue every 5 (five) minutes as needed.   Omega-3 Fatty Acids (FISH OIL) 1200 MG CAPS Take 1,200 mg by mouth at bedtime.   pantoprazole (PROTONIX) 40 MG tablet Take 1 tablet (40 mg total) by mouth daily.   rosuvastatin (CRESTOR) 40 MG tablet Take 1 tablet by mouth once daily   venlafaxine XR (EFFEXOR-XR) 150 MG 24 hr capsule Take 1 capsule (150 mg total) by mouth daily with breakfast.   vitamin B-12  (CYANOCOBALAMIN) 1000 MCG tablet Take 2,000 mcg by mouth daily.    [DISCONTINUED] carvedilol (COREG) 3.125 MG tablet Take 1 tablet by mouth once a day Oral    Allergies: No Known Allergies  Social History   Socioeconomic History   Marital status: Widowed    Spouse name: Not on file   Number of children: Not on file   Years of education: Not on file   Highest education level: Not on file  Occupational History   Not on file  Tobacco Use   Smoking status: Never   Smokeless tobacco: Never  Vaping Use   Vaping Use: Never used  Substance and Sexual Activity   Alcohol use: Yes    Alcohol/week: 1.0 standard drink of alcohol    Types: 1 Glasses of wine per week   Drug use: Never   Sexual activity: Not on file  Other Topics Concern   Not on file  Social History Narrative   Not on file   Social Determinants of Health   Financial Resource Strain: Not on file  Food Insecurity: Not on file  Transportation Needs: Not on file  Physical Activity: Not on file  Stress: Not on file  Social Connections: Not on file  Intimate Partner Violence: Not on file     Family History  Problem Relation Age of Onset   Alzheimer's disease Mother    Heart attack Father    Aneurysm Sister      ROS:  Please see the history of present illness.     All other systems reviewed and negative.    Physical Exam:  Blood pressure 108/64, pulse 74, height '5\' 3"'$  (1.6 m), weight 131 lb 6.4 oz (59.6 kg), SpO2 93 %. General: Well developed, well nourished female in no acute distress. Head: Normocephalic, atraumatic, sclera non-icteric, no xanthomas, nares are without discharge. EENT: normal  Lymph Nodes:  none Neck: Negative for carotid bruits. JVD not elevated. Back:without scoliosis kyphosis  Lungs: Clear bilaterally to auscultation without wheezes, rales, or rhonchi. Breathing is unlabored. Heart: RRR with S1 S2. No murmur . No rubs, or gallops appreciated. Abdomen: Soft, non-tender, non-distended with  normoactive bowel sounds. No hepatomegaly. No rebound/guarding. No obvious abdominal masses. Msk:  Strength and tone appear normal for age. Extremities: No clubbing or cyanosis. No  edema.  Distal pedal pulses are 2+ and equal bilaterally. Skin: Warm and Dry Neuro: Alert and oriented X 3. CN III-XII intact Grossly normal sensory and motor function . Psych:  Responds to questions appropriately with a normal affect.        EKG: Sinus at 74 Interval 15/13/42 Axis left -49 Left bundle branch block PVCs-frequent with a right bundle inferior  axis morphology and then transition V1-V2   Assessment and Plan:  Ischemic and nonischemic cardiomyopathy  Left bundle branch block QRS duration 120 ms  Congestive heart failure-chronic-systolic class II  PVCs-LVOT-frequent  Low blood pressure   The patient has primarily nonischemic cardiomyopathy with concomitant coronary disease.  Her class II symptoms and a QRS duration of less than 130 ms would not be an indication for efforts at CRT.  The fact that her cardiomyopathy is primarily nonischemic I think argues against the role of the benefit of her primary prevention ICD.  Hence, I do not think there is a role for device therapy.  With her cardiomyopathy, in the absence of lightheadedness, and not withstanding her low blood pressure, we have discussed the role of guideline directed therapy.  We will try and add ARB therapy with losartan 12.5 mg.  I reviewed side effects, primarily fatigue and renal issues I will check a metabolic profile in 2 weeks.  Her PVCs while frequent on the current electrocardiogram are not apparent on prior electrocardiograms.  Hence, I think they are unlikely contributing to her cardiomyopathy and as such, in the absence of symptoms, and a stable left ventricular ejection fraction, would not quantitate.     Virl Axe

## 2021-11-26 ENCOUNTER — Other Ambulatory Visit (HOSPITAL_COMMUNITY): Payer: Self-pay | Admitting: *Deleted

## 2021-11-27 ENCOUNTER — Ambulatory Visit (HOSPITAL_COMMUNITY)
Admission: RE | Admit: 2021-11-27 | Discharge: 2021-11-27 | Disposition: A | Discharge status: Home / Self Care | Payer: Medicare Other | Source: Ambulatory Visit | Attending: Internal Medicine | Admitting: Internal Medicine

## 2021-11-27 DIAGNOSIS — M810 Age-related osteoporosis without current pathological fracture: Secondary | ICD-10-CM | POA: Diagnosis not present

## 2021-11-27 MED ORDER — DENOSUMAB 60 MG/ML ~~LOC~~ SOSY: 60.0000 mg | PREFILLED_SYRINGE | Freq: Once | SUBCUTANEOUS | Status: AC

## 2021-11-27 MED ORDER — DENOSUMAB 60 MG/ML ~~LOC~~ SOSY
PREFILLED_SYRINGE | SUBCUTANEOUS | Status: AC
  Administered 2021-11-27: 60 mg via SUBCUTANEOUS
  Filled 2021-11-27: qty 1

## 2021-12-05 ENCOUNTER — Other Ambulatory Visit: Payer: Medicare Other

## 2021-12-05 DIAGNOSIS — I429 Cardiomyopathy, unspecified: Secondary | ICD-10-CM | POA: Diagnosis not present

## 2021-12-05 DIAGNOSIS — Z79899 Other long term (current) drug therapy: Secondary | ICD-10-CM | POA: Diagnosis not present

## 2021-12-05 DIAGNOSIS — I5022 Chronic systolic (congestive) heart failure: Secondary | ICD-10-CM | POA: Diagnosis not present

## 2021-12-05 LAB — BASIC METABOLIC PANEL
BUN/Creatinine Ratio: 16 (ref 12–28)
BUN: 13 mg/dL (ref 8–27)
CO2: 23 mmol/L (ref 20–29)
Calcium: 9.4 mg/dL (ref 8.7–10.3)
Chloride: 105 mmol/L (ref 96–106)
Creatinine, Ser: 0.82 mg/dL (ref 0.57–1.00)
Glucose: 79 mg/dL (ref 70–99)
Potassium: 4.3 mmol/L (ref 3.5–5.2)
Sodium: 141 mmol/L (ref 134–144)
eGFR: 70 mL/min/{1.73_m2} (ref >59)

## 2021-12-10 ENCOUNTER — Telehealth: Payer: Self-pay | Admitting: Internal Medicine

## 2021-12-10 NOTE — Telephone Encounter (Signed)
Pt is requesting call back. She was told by RN Rosann Auerbach to call in if she need instructions after the blood work. Requesting call back.

## 2021-12-11 NOTE — Telephone Encounter (Signed)
Attempted phone call to pt and left voicemail message to contact RN at [phone].

## 2021-12-11 NOTE — Telephone Encounter (Signed)
Spoke with pt who is asking to confirm she is to be taking Losartan '25mg'$  - 1/2 tablet nightly and Metoprolol Succinate '25mg'$  - 1/2 tablet daily.  Pt advised this is the medication and dosage that has been prescribed.  Pt states she is tolerating medication without side effects and B/P and HR has been WNL.  Per Dr Caryl Comes may follow up with him as needed and continue as scheduled with Dr Acie Fredrickson.  Pt verbalizes understanding and agrees with current plan.

## 2021-12-11 NOTE — Telephone Encounter (Signed)
Patient returned RN's call. 

## 2022-03-04 DIAGNOSIS — M25561 Pain in right knee: Secondary | ICD-10-CM | POA: Diagnosis not present

## 2022-03-31 DIAGNOSIS — H43813 Vitreous degeneration, bilateral: Secondary | ICD-10-CM | POA: Diagnosis not present

## 2022-03-31 DIAGNOSIS — Z961 Presence of intraocular lens: Secondary | ICD-10-CM | POA: Diagnosis not present

## 2022-03-31 DIAGNOSIS — H353131 Nonexudative age-related macular degeneration, bilateral, early dry stage: Secondary | ICD-10-CM | POA: Diagnosis not present

## 2022-05-08 DIAGNOSIS — Z1231 Encounter for screening mammogram for malignant neoplasm of breast: Secondary | ICD-10-CM | POA: Diagnosis not present

## 2022-05-29 ENCOUNTER — Other Ambulatory Visit (HOSPITAL_COMMUNITY): Payer: Self-pay | Admitting: *Deleted

## 2022-06-01 ENCOUNTER — Ambulatory Visit (HOSPITAL_COMMUNITY)
Admission: RE | Admit: 2022-06-01 | Discharge: 2022-06-01 | Disposition: A | Discharge status: Home / Self Care | Payer: Medicare Other | Source: Ambulatory Visit | Attending: Internal Medicine | Admitting: Internal Medicine

## 2022-06-01 DIAGNOSIS — M810 Age-related osteoporosis without current pathological fracture: Secondary | ICD-10-CM | POA: Insufficient documentation

## 2022-06-01 MED ORDER — DENOSUMAB 60 MG/ML ~~LOC~~ SOSY: 60.0000 mg | PREFILLED_SYRINGE | Freq: Once | SUBCUTANEOUS | Status: AC

## 2022-06-01 MED ORDER — DENOSUMAB 60 MG/ML ~~LOC~~ SOSY
PREFILLED_SYRINGE | SUBCUTANEOUS | Status: AC
  Administered 2022-06-01: 60 mg via SUBCUTANEOUS
  Filled 2022-06-01: qty 1

## 2022-07-15 ENCOUNTER — Emergency Department (HOSPITAL_COMMUNITY): Payer: Medicare Other

## 2022-07-15 ENCOUNTER — Encounter (HOSPITAL_COMMUNITY): Payer: Self-pay

## 2022-07-15 ENCOUNTER — Other Ambulatory Visit: Payer: Self-pay

## 2022-07-15 ENCOUNTER — Observation Stay (HOSPITAL_COMMUNITY)
Admission: EM | Admit: 2022-07-15 | Discharge: 2022-07-16 | Disposition: A | Discharge status: Home / Self Care | Payer: Medicare Other | Attending: Family Medicine | Admitting: Family Medicine

## 2022-07-15 DIAGNOSIS — Z7902 Long term (current) use of antithrombotics/antiplatelets: Secondary | ICD-10-CM | POA: Diagnosis not present

## 2022-07-15 DIAGNOSIS — I447 Left bundle-branch block, unspecified: Secondary | ICD-10-CM | POA: Diagnosis not present

## 2022-07-15 DIAGNOSIS — I2510 Atherosclerotic heart disease of native coronary artery without angina pectoris: Secondary | ICD-10-CM | POA: Insufficient documentation

## 2022-07-15 DIAGNOSIS — I213 ST elevation (STEMI) myocardial infarction of unspecified site: Secondary | ICD-10-CM | POA: Diagnosis not present

## 2022-07-15 DIAGNOSIS — Z955 Presence of coronary angioplasty implant and graft: Secondary | ICD-10-CM | POA: Insufficient documentation

## 2022-07-15 DIAGNOSIS — Z79899 Other long term (current) drug therapy: Secondary | ICD-10-CM | POA: Diagnosis not present

## 2022-07-15 DIAGNOSIS — R531 Weakness: Secondary | ICD-10-CM

## 2022-07-15 DIAGNOSIS — E039 Hypothyroidism, unspecified: Secondary | ICD-10-CM | POA: Insufficient documentation

## 2022-07-15 DIAGNOSIS — J0110 Acute frontal sinusitis, unspecified: Secondary | ICD-10-CM | POA: Insufficient documentation

## 2022-07-15 DIAGNOSIS — I509 Heart failure, unspecified: Secondary | ICD-10-CM | POA: Diagnosis not present

## 2022-07-15 DIAGNOSIS — R55 Syncope and collapse: Secondary | ICD-10-CM

## 2022-07-15 DIAGNOSIS — R0902 Hypoxemia: Secondary | ICD-10-CM | POA: Diagnosis not present

## 2022-07-15 HISTORY — DX: Hyperlipidemia, unspecified: E78.5

## 2022-07-15 HISTORY — DX: Syncope and collapse: R55

## 2022-07-15 HISTORY — DX: Ventricular premature depolarization: I49.3

## 2022-07-15 HISTORY — DX: Left bundle-branch block, unspecified: I44.7

## 2022-07-15 HISTORY — DX: Chronic systolic (congestive) heart failure: I50.22

## 2022-07-15 HISTORY — DX: Disorder of arteries and arterioles, unspecified: I77.9

## 2022-07-15 LAB — CBC WITH DIFFERENTIAL/PLATELET
Abs Immature Granulocytes: 0.01 10*3/uL (ref 0.00–0.07)
Basophils Absolute: 0 10*3/uL (ref 0.0–0.1)
Basophils Relative: 1 %
Eosinophils Absolute: 0.1 10*3/uL (ref 0.0–0.5)
Eosinophils Relative: 1 %
HCT: 42.1 % (ref 36.0–46.0)
Hemoglobin: 13.6 g/dL (ref 12.0–15.0)
Immature Granulocytes: 0 %
Lymphocytes Relative: 31 %
Lymphs Abs: 2.7 10*3/uL (ref 0.7–4.0)
MCH: 31.9 pg (ref 26.0–34.0)
MCHC: 32.3 g/dL (ref 30.0–36.0)
MCV: 98.8 fL (ref 80.0–100.0)
Monocytes Absolute: 0.6 10*3/uL (ref 0.1–1.0)
Monocytes Relative: 7 %
Neutro Abs: 5.2 10*3/uL (ref 1.7–7.7)
Neutrophils Relative %: 60 %
Platelets: 227 10*3/uL (ref 150–400)
RBC: 4.26 MIL/uL (ref 3.87–5.11)
RDW: 14.3 % (ref 11.5–15.5)
WBC: 8.7 10*3/uL (ref 4.0–10.5)
nRBC: 0 % (ref 0.0–0.2)

## 2022-07-15 LAB — URINALYSIS, W/ REFLEX TO CULTURE (INFECTION SUSPECTED)
Bacteria, UA: NONE SEEN
Bilirubin Urine: NEGATIVE
Glucose, UA: NEGATIVE mg/dL
Hgb urine dipstick: NEGATIVE
Ketones, ur: NEGATIVE mg/dL
Nitrite: NEGATIVE
Protein, ur: NEGATIVE mg/dL
Specific Gravity, Urine: 1.008 (ref 1.005–1.030)
pH: 5 (ref 5.0–8.0)

## 2022-07-15 LAB — TROPONIN I (HIGH SENSITIVITY)
Troponin I (High Sensitivity): 14 ng/L (ref <18)
Troponin I (High Sensitivity): 12 ng/L (ref <18)

## 2022-07-15 LAB — COMPREHENSIVE METABOLIC PANEL
ALT: 16 U/L (ref 0–44)
AST: 29 U/L (ref 15–41)
Albumin: 3.5 g/dL (ref 3.5–5.0)
Alkaline Phosphatase: 54 U/L (ref 38–126)
Anion gap: 9 (ref 5–15)
BUN: 12 mg/dL (ref 8–23)
CO2: 19 mmol/L — ABNORMAL LOW (ref 22–32)
Calcium: 8.3 mg/dL — ABNORMAL LOW (ref 8.9–10.3)
Chloride: 108 mmol/L (ref 98–111)
Creatinine, Ser: 0.89 mg/dL (ref 0.44–1.00)
GFR, Estimated: >60 mL/min (ref >60)
Glucose, Bld: 98 mg/dL (ref 70–99)
Potassium: 3.9 mmol/L (ref 3.5–5.1)
Sodium: 136 mmol/L (ref 135–145)
Total Bilirubin: 0.5 mg/dL (ref 0.3–1.2)
Total Protein: 6.7 g/dL (ref 6.5–8.1)

## 2022-07-15 LAB — BRAIN NATRIURETIC PEPTIDE: B Natriuretic Peptide: 203.9 pg/mL — ABNORMAL HIGH (ref 0.0–100.0)

## 2022-07-15 MED ORDER — AMOXICILLIN-POT CLAVULANATE 875-125 MG PO TABS
1.0000 | ORAL_TABLET | Freq: Once | ORAL | Status: AC
  Administered 2022-07-16: 1 via ORAL
  Filled 2022-07-15: qty 1

## 2022-07-15 NOTE — ED Triage Notes (Signed)
Per EMS pt had syncope episode. Pt was out for about 1 minute. Pt had a vomiting episode. Pt given 456m with EMS.   20G Left FA

## 2022-07-15 NOTE — ED Provider Notes (Signed)
[facility] Provider Note  CSN: [id number] Arrival date & time: 07/15/22 1832  Chief Complaint(s) Loss of Consciousness  HPI Susan [name] is a 89 y.o. female with history of coronary artery disease, CHF, hyperlipidemia, presenting to the emergency department with syncope.  Patient reports that she was at church today, suddenly felt lightheaded, lost consciousness.  Did not fall, was laid down by bystanders.  Patient reportedly vomiting although she does not remember this.  She denies any chest pain or palpitations but reports that "this is how I felt when I had my stent".  She reports she did not have chest pain when she had a heart attack earlier.  She was unconscious for around 1 minute.  Patient reports she feels back to baseline.  No ongoing nausea, vomiting.  No chest pain, palpitations.  No fevers or chills.  No urinary symptoms.  No runny nose, sore throat, cough.  Patient does report that she has had congestion, left-sided headache for the last couple of months.  She is worried that she has a sinus infection.  No numbness or tingling, weakness.  No speech changes, trouble swallowing.   Past Medical History Past Medical History:  Diagnosis Date   Coronary artery disease    POST PTCA AND STENTING OF HER RIGHT CORONARY ARTERY   Hypothyroidism    LAD stenosis    MODERATE 50-60% STENOSIS   Osteoporosis    Patient Active Problem List   Diagnosis Date Noted   Osteoporosis 09/16/2021   Chest pain 03/21/2020   Syncope and collapse 11/13/2018   Syncope 11/12/2018   Fall 11/12/2018   Right clavicle fracture [id number]   Chronic systolic CHF (congestive heart failure) ([place]) 02/11/2018   Coronary artery disease    Dyspnea on exertion 10/08/2017   Cardiomyopathy ([place]) 10/08/2017   Posterior vitreous detachment of both eyes 04/28/2016   Hyperlipidemia 07/18/2014   Nonexudative age-related macular degeneration 07/17/2014   Status post  intraocular lens implant 07/17/2014   Pseudophakia 10/25/2013   Anxiety 10/11/2013   Mechanical complication of intraocular lens 08/12/2012   Nuclear sclerosis 08/12/2012   Posterior capsule opacification, right 08/12/2012   Macular degeneration 08/04/2011   Pituitary adenoma ([place]) 07/15/2011   Arteriosclerosis of coronary artery    Hypothyroidism    Home Medication(s) Prior to Admission medications   Medication Sig Start Date End Date Taking? Authorizing Provider  acetaminophen (TYLENOL) 325 MG tablet Take 2 tablets (650 mg total) by mouth every 6 (six) hours. 11/14/18   Johnson, Clanford L, MD  Calcium Carbonate-Vitamin D (CALCIUM + D PO) Take 2 tablets by mouth at bedtime.    [provider]  cholecalciferol (VITAMIN D3) 25 MCG (1000 UT) tablet Take 1,000 Units by mouth daily after breakfast.    [provider]  clopidogrel (PLAVIX) 75 MG tablet Take 1 tablet by mouth once daily 11/18/21   Nahser, Wonda Cheng, MD  levothyroxine (SYNTHROID, LEVOTHROID) 75 MCG tablet Take 75 mcg by mouth daily before breakfast.  11/30/17   [provider]  losartan (COZAAR) 25 MG tablet Take 0.5 tablets (12.5 mg total) by mouth at bedtime. 11/20/21   Deboraha Sprang, MD  metoprolol succinate (TOPROL-XL) 25 MG 24 hr tablet Take 1/2 (one-half) tablet by mouth once daily 11/03/21   Nahser, Wonda Cheng, MD  multivitamin-lutein [facility]) CAPS capsule Take 1 capsule by mouth 2 (two) times a day.     [provider]  nitroGLYCERIN (NITROSTAT) 0.4 MG SL tablet Place  1 tablet (0.4 mg total) under the tongue every 5 (five) minutes as needed. 06/22/11   Nahser, Wonda Cheng, MD  Omega-3 Fatty Acids (FISH OIL) 1200 MG CAPS Take 1,200 mg by mouth at bedtime.    [provider]  pantoprazole (PROTONIX) 40 MG tablet Take 1 tablet (40 mg total) by mouth daily. 08/22/21   Nahser, Wonda Cheng, MD  rosuvastatin (CRESTOR) 40 MG tablet Take 1 tablet by mouth once daily 10/22/21   Nahser, Wonda Cheng,  MD  venlafaxine XR (EFFEXOR-XR) 150 MG 24 hr capsule Take 1 capsule (150 mg total) by mouth daily with breakfast. 11/18/21   Nahser, Wonda Cheng, MD  vitamin B-12 (CYANOCOBALAMIN) 1000 MCG tablet Take 2,000 mcg by mouth daily.     [provider]                                                                                                                                    Past Surgical History Past Surgical History:  Procedure Laterality Date   APPENDECTOMY  1970s?   "ruptured"    BACK SURGERY     CATARACT EXTRACTION W/ INTRAOCULAR LENS  IMPLANT, BILATERAL Bilateral    CORONARY ANGIOPLASTY WITH STENT PLACEMENT  ~ 2009   "2 stents"   FRACTURE SURGERY     IR VERTEBROPLASTY CERV/THOR BX INC UNI/BIL INC/INJECT/IMAGING  02/07/2018   KNEE ARTHROSCOPY Right     torn meniscus   LEFT HEART CATH AND CORONARY ANGIOGRAPHY N/A 03/21/2020   Procedure: LEFT HEART CATH AND CORONARY ANGIOGRAPHY;  Surgeon: Lorretta Harp, MD;  Location: [facility];  Service: Cardiovascular;  Laterality: N/A;   LUMBAR LAMINECTOMY  1978   OVARIAN CYST SURGERY  1970s?   "ruptured"    RIGHT/LEFT HEART CATH AND CORONARY ANGIOGRAPHY N/A 10/08/2017   Procedure: RIGHT/LEFT HEART CATH AND CORONARY ANGIOGRAPHY;  Surgeon: Nelva Bush, MD;  Location: [facility];  Service: Cardiovascular;  Laterality: N/A;   WRIST FRACTURE SURGERY Bilateral    "2 surgeries for 2 breaks on left side; 1 surgery for 1 break on right wrist"   Family History Family History  Problem Relation Age of Onset   Alzheimer's disease Mother    Heart attack Father    Aneurysm Sister     Social History Social History   Tobacco Use   Smoking status: Never   Smokeless tobacco: Never  Vaping Use   Vaping Use: Never used  Substance Use Topics   Alcohol use: Yes    Alcohol/week: 1.0 standard drink of alcohol    Types: 1 Glasses of wine per week   Drug use: Never   Allergies Patient has no known allergies.  Review of  Systems Review of Systems  All other systems reviewed and are negative.   Physical Exam Vital Signs  I have reviewed the triage vital signs BP (!) 117/48   Pulse 77   Temp (!) 96.9  F (36.1 C) (Axillary)   Resp (!) 22   Ht '5\' 3"'$  (1.6 m)   Wt 59 kg   SpO2 95%   BMI 23.03 kg/m  Physical Exam Vitals and nursing note reviewed.  Constitutional:      General: She is not in acute distress.    Appearance: She is well-developed.  HENT:     Head: Normocephalic and atraumatic.     Comments: TTP over left frontal sinus     Mouth/Throat:     Mouth: Mucous membranes are moist.  Eyes:     Pupils: Pupils are equal, round, and reactive to light.  Cardiovascular:     Rate and Rhythm: Normal rate and regular rhythm.     Heart sounds: No murmur heard. Pulmonary:     Effort: Pulmonary effort is normal. No respiratory distress.     Breath sounds: Normal breath sounds.  Abdominal:     General: Abdomen is flat.     Palpations: Abdomen is soft.     Tenderness: There is no abdominal tenderness.  Musculoskeletal:        General: No tenderness.     Right lower leg: No edema.     Left lower leg: No edema.  Skin:    General: Skin is warm and dry.  Neurological:     General: No focal deficit present.     Mental Status: She is alert. Mental status is at baseline.     Comments: Cranial nerves II through XII intact, strength 5 out of 5 in the bilateral upper and lower extremities, no sensory deficit to light touch, no dysmetria on finger-nose-finger testing, ambulatory with steady gait.   Psychiatric:        Mood and Affect: Mood normal.        Behavior: Behavior normal.     ED Results and Treatments Labs (all labs ordered are listed, but only abnormal results are displayed) Labs Reviewed  COMPREHENSIVE METABOLIC PANEL - Abnormal; Notable for the following components:      Result Value   CO2 19 (*)    Calcium 8.3 (*)    All other components within normal limits  URINALYSIS, W/ REFLEX  TO CULTURE (INFECTION SUSPECTED) - Abnormal; Notable for the following components:   Color, Urine STRAW (*)    Leukocytes,Ua TRACE (*)    All other components within normal limits  BRAIN NATRIURETIC PEPTIDE - Abnormal; Notable for the following components:   B Natriuretic Peptide 203.9 (*)    All other components within normal limits  CBC WITH DIFFERENTIAL/PLATELET  TROPONIN I (HIGH SENSITIVITY)  TROPONIN I (HIGH SENSITIVITY)                                                                                                                          Radiology CT Head Wo Contrast  Result Date: 07/15/2022 CLINICAL DATA:  Syncope/presyncope, cerebrovascular cause suspected EXAM: CT HEAD WITHOUT CONTRAST TECHNIQUE: Contiguous axial images were obtained from the base of  the skull through the vertex without intravenous contrast. RADIATION DOSE REDUCTION: This exam was performed according to the departmental dose-optimization program which includes automated exposure control, adjustment of the mA and/or kV according to patient size and/or use of iterative reconstruction technique. COMPARISON:  CT head 11/12/2018. FINDINGS: Brain: Cerebral ventricle sizes are concordant with the degree of cerebral volume loss. Patchy and confluent areas of decreased attenuation are noted throughout the deep and periventricular white matter of the cerebral hemispheres bilaterally, compatible with chronic microvascular ischemic disease. No evidence of large-territorial acute infarction. No parenchymal hemorrhage. No mass lesion. No extra-axial collection. No mass effect or midline shift. No hydrocephalus. Basilar cisterns are patent. Vascular: No hyperdense vessel. Atherosclerotic calcifications are present within the cavernous internal carotid and vertebral arteries. Skull: No acute fracture or focal lesion. Sinuses/Orbits: Left frontal sinus almost complete opacification. Paranasal sinuses and mastoid air cells are clear. The  orbits are unremarkable. Other: None. IMPRESSION: 1. No acute intracranial abnormality. 2. Almost complete opacification of left frontal sinus. Electronically Signed   By: Iven Finn M.D.   On: 07/15/2022 20:41   DG Chest Port 1 View  Result Date: 07/15/2022 CLINICAL DATA:  Syncopal episode EXAM: PORTABLE CHEST 1 VIEW COMPARISON:  03/20/2020 FINDINGS: Stable cardiomegaly. Aortic atherosclerotic calcification. Predominantly interstitial opacities in the lung bases. Bibasilar atelectasis/scarring. No pleural effusion or pneumothorax. No displaced rib fractures. Vertebroplasty of a midthoracic vertebral body. IMPRESSION: Interstitial opacities in the lower lungs may be due to mild edema or atypical infection. Electronically Signed   By: Placido Sou M.D.   On: 07/15/2022 19:29    Pertinent labs & imaging results that were available during my care of the patient were reviewed by me and considered in my medical decision making (see MDM for details).  Medications Ordered in ED Medications  amoxicillin-clavulanate (AUGMENTIN) 875-125 MG per tablet 1 tablet (has no administration in time range)                                                                                                                                     Procedures Procedures  (including critical care time)  Medical Decision Making / ED Course   MDM:  89 year old female presenting to the emergency department syncope.  Patient overall well-appearing, appears euvolemic.  No significant lower extremity swelling and lungs clear.  Does have some mild frontal tenderness to palpation.  Neurologic exam is normal.  EKG was left bundle branch block, appears baseline.  Unclear cause of near syncope, given history of CHF, coronary artery disease, patient is high risk for cardiogenic cause of syncope.  Patient should be admitted for further syncope workup including echocardiogram and telemetry.  Discussed with hospitalist who  agrees.  Less likely orthostatic syncope, patient reports that she had been standing prior to this.  Less likely vasovagal syncope, no clear trigger.  No seizure activity suggest neurogenic cause of fainting.  CT head negative for  acute bleed.  Patient does report some chronic left frontal headaches, CT does show evidence of sinusitis, given length of symptoms will treat with Augmentin.  Doubt that this is related to her current presentation.  Patient will be admitted for further management.        Additional history obtained: -Additional history obtained from ems and friend -External records from outside source obtained and reviewed including: Chart review including previous notes, labs, imaging, consultation notes including previous cardiology notes   Lab Tests: -I ordered, reviewed, and interpreted labs.   The pertinent results include:   Labs Reviewed  COMPREHENSIVE METABOLIC PANEL - Abnormal; Notable for the following components:      Result Value   CO2 19 (*)    Calcium 8.3 (*)    All other components within normal limits  URINALYSIS, W/ REFLEX TO CULTURE (INFECTION SUSPECTED) - Abnormal; Notable for the following components:   Color, Urine STRAW (*)    Leukocytes,Ua TRACE (*)    All other components within normal limits  BRAIN NATRIURETIC PEPTIDE - Abnormal; Notable for the following components:   B Natriuretic Peptide 203.9 (*)    All other components within normal limits  CBC WITH DIFFERENTIAL/PLATELET  TROPONIN I (HIGH SENSITIVITY)  TROPONIN I (HIGH SENSITIVITY)    Notable for elevated BNP  EKG   EKG Interpretation  Date/Time:  Wednesday July 15 2022 19:01:10 EDT Ventricular Rate:  82 PR Interval:  170 QRS Duration: 135 QT Interval:  432 QTC Calculation: 505 R Axis:   -7 Text Interpretation: Sinus rhythm Ventricular premature complex Left bundle branch block No significant change since last tracing Confirmed by Garnette Gunner [phone]) on 07/15/2022 7:16:06 PM          Imaging Studies ordered: I ordered imaging studies including CXR, CT head On my interpretation imaging demonstrates frontal sinusitis  I independently visualized and interpreted imaging. I agree with the radiologist interpretation   Medicines ordered and prescription drug management: Meds ordered this encounter  Medications   amoxicillin-clavulanate (AUGMENTIN) 875-125 MG per tablet 1 tablet    -I have reviewed the patients home medicines and have made adjustments as needed   Consultations Obtained: I requested consultation with the hospitalist,  and discussed lab and imaging findings as well as pertinent plan - they recommend: admission   Cardiac Monitoring: The patient was maintained on a cardiac monitor.  I personally viewed and interpreted the cardiac monitored which showed an underlying rhythm of: nsr  Reevaluation: After the interventions noted above, I reevaluated the patient and found that their symptoms have improved  Co morbidities that complicate the patient evaluation  Past Medical History:  Diagnosis Date   Coronary artery disease    POST PTCA AND STENTING OF HER RIGHT CORONARY ARTERY   Hypothyroidism    LAD stenosis    MODERATE 50-60% STENOSIS   Osteoporosis       Dispostion: Disposition decision including need for hospitalization was considered, and patient admitted to the hospital.    Final Clinical Impression(s) / ED Diagnoses Final diagnoses:  Syncope and collapse  Subacute frontal sinusitis     This chart was dictated using voice recognition software.  Despite best efforts to proofread,  errors can occur which can change the documentation meaning.    Cristie Hem, MD 07/15/22 2352

## 2022-07-15 NOTE — ED Notes (Addendum)
Ambulates without need for assistance, no dizziness

## 2022-07-16 ENCOUNTER — Encounter (HOSPITAL_COMMUNITY): Payer: Self-pay | Admitting: Internal Medicine

## 2022-07-16 DIAGNOSIS — R531 Weakness: Secondary | ICD-10-CM

## 2022-07-16 DIAGNOSIS — R55 Syncope and collapse: Secondary | ICD-10-CM | POA: Diagnosis not present

## 2022-07-16 LAB — CBC
HCT: 37.1 % (ref 36.0–46.0)
Hemoglobin: 12.6 g/dL (ref 12.0–15.0)
MCH: 32.6 pg (ref 26.0–34.0)
MCHC: 34 g/dL (ref 30.0–36.0)
MCV: 95.9 fL (ref 80.0–100.0)
Platelets: 207 10*3/uL (ref 150–400)
RBC: 3.87 MIL/uL (ref 3.87–5.11)
RDW: 14.3 % (ref 11.5–15.5)
WBC: 5.8 10*3/uL (ref 4.0–10.5)
nRBC: 0 % (ref 0.0–0.2)

## 2022-07-16 LAB — COMPREHENSIVE METABOLIC PANEL
ALT: 15 U/L (ref 0–44)
AST: 22 U/L (ref 15–41)
Albumin: 3.1 g/dL — ABNORMAL LOW (ref 3.5–5.0)
Alkaline Phosphatase: 50 U/L (ref 38–126)
Anion gap: 7 (ref 5–15)
BUN: 13 mg/dL (ref 8–23)
CO2: 19 mmol/L — ABNORMAL LOW (ref 22–32)
Calcium: 8 mg/dL — ABNORMAL LOW (ref 8.9–10.3)
Chloride: 113 mmol/L — ABNORMAL HIGH (ref 98–111)
Creatinine, Ser: 0.8 mg/dL (ref 0.44–1.00)
GFR, Estimated: >60 mL/min (ref >60)
Glucose, Bld: 104 mg/dL — ABNORMAL HIGH (ref 70–99)
Potassium: 3.7 mmol/L (ref 3.5–5.1)
Sodium: 139 mmol/L (ref 135–145)
Total Bilirubin: 0.6 mg/dL (ref 0.3–1.2)
Total Protein: 5.9 g/dL — ABNORMAL LOW (ref 6.5–8.1)

## 2022-07-16 LAB — MAGNESIUM: Magnesium: 2.2 mg/dL (ref 1.7–2.4)

## 2022-07-16 MED ORDER — CLOPIDOGREL BISULFATE 75 MG PO TABS
75.0000 mg | ORAL_TABLET | Freq: Every day | ORAL | Status: DC
  Administered 2022-07-16: 75 mg via ORAL
  Filled 2022-07-16: qty 1

## 2022-07-16 MED ORDER — SODIUM CHLORIDE 0.9 % IV SOLN
100.0000 mg | Freq: Two times a day (BID) | INTRAVENOUS | Status: DC
  Administered 2022-07-16: 100 mg via INTRAVENOUS
  Filled 2022-07-16: qty 100

## 2022-07-16 MED ORDER — LEVOTHYROXINE SODIUM 75 MCG PO TABS
75.0000 ug | ORAL_TABLET | Freq: Every day | ORAL | Status: DC
  Administered 2022-07-16: 75 ug via ORAL
  Filled 2022-07-16: qty 1

## 2022-07-16 MED ORDER — VENLAFAXINE HCL ER 75 MG PO CP24
150.0000 mg | ORAL_CAPSULE | Freq: Every day | ORAL | Status: DC
  Administered 2022-07-16: 150 mg via ORAL
  Filled 2022-07-16: qty 2

## 2022-07-16 MED ORDER — ALBUTEROL SULFATE (2.5 MG/3ML) 0.083% IN NEBU: 2.5000 mg | INHALATION_SOLUTION | RESPIRATORY_TRACT | Status: DC | PRN

## 2022-07-16 MED ORDER — ROSUVASTATIN CALCIUM 20 MG PO TABS
40.0000 mg | ORAL_TABLET | Freq: Every day | ORAL | Status: DC
  Administered 2022-07-16: 40 mg via ORAL
  Filled 2022-07-16: qty 2

## 2022-07-16 MED ORDER — PROSIGHT PO TABS
1.0000 | ORAL_TABLET | Freq: Every day | ORAL | Status: DC
  Administered 2022-07-16: 1 via ORAL
  Filled 2022-07-16 (×2): qty 1

## 2022-07-16 MED ORDER — PANTOPRAZOLE SODIUM 40 MG PO TBEC
40.0000 mg | DELAYED_RELEASE_TABLET | Freq: Every day | ORAL | Status: DC
  Administered 2022-07-16: 40 mg via ORAL
  Filled 2022-07-16: qty 1

## 2022-07-16 MED ORDER — HEPARIN SODIUM (PORCINE) 5000 UNIT/ML IJ SOLN
5000.0000 [IU] | Freq: Three times a day (TID) | INTRAMUSCULAR | Status: DC
  Filled 2022-07-16 (×2): qty 1

## 2022-07-16 MED ORDER — VITAMIN B-12 1000 MCG PO TABS
2000.0000 ug | ORAL_TABLET | Freq: Every day | ORAL | Status: DC
  Administered 2022-07-16: 2000 ug via ORAL
  Filled 2022-07-16: qty 2

## 2022-07-16 NOTE — Discharge Summary (Signed)
Physician Discharge Summary  Susan [name] [id number] DOB: Nov 09, 1933 DOA: 07/15/2022  PCP: Crist Infante, MD  Admit date: 07/15/2022 Discharge date: 07/16/2022 30 Day Unplanned Readmission Risk Score    Flowsheet Row ED from 07/15/2022 in [facility] at [facility]  30 Day Unplanned Readmission Risk Score (%) 10.27 Filed at 07/16/2022 1600       This score is the patient's risk of an unplanned readmission within 30 days of being discharged (0 -100%). The score is based on dignosis, age, lab data, medications, orders, and past utilization.   Low:  0-14.9   Medium: 15-21.9   High: 22-29.9   Extreme: 30 and above          Admitted From: Home Disposition: Home  Recommendations for Outpatient Follow-up:  Follow up with PCP in 1-2 weeks Please obtain BMP/CBC in one week Follow-up with cardiology on July 27, 2022. Please follow up with your PCP on the following pending results: Unresulted Labs (From admission, onward)    None         Home Health: None Equipment/Devices: None  Discharge Condition: Stable CODE STATUS: Full code Diet recommendation: Cardiac  Following HPI and ED course is copied from admitting hospitalist H&P. HPI: Susan [name] is a 89 y.o. female with medical history significant of  CAD s/p MI that presented as syncope s/p  stent RCA 10 years ago, Ischemic CMY, CHFref Class II, PVC-LVOT,LBBB,hypothyroidism who presents from [place] after as syncopal episode. Patient states prior to this episode she had felt her normal self other that subacute right sided sinus headache which she states she has had for month due to her chronic sinusitis.  Patient states prior to episode she felt faint and weak. She states due to feeling unwell she when to sit at a table. She states she was told she passed out. She states she felt very weak and noted that she remembers being placed on the floor.She notes no chest pain , no palpitation, no nausea  or vomiting prior to episode or directly after episode.  She denies any bladder or bowel incontinence. She notes no focal weakness or difficulty with speech or swallowing. She currently however that after eating in the ED she had episode of indigestion and nausea , diarrhea, she notes after treatment these symptoms have improved. ED Course: \ Vitals: temp 96.9, bp 136/59, hr 88, rr 22, sat 100%  [id number] rhythm multiple pvc , LBBB Wbc 8.7, hgb 13.6, plt 227 bNP 203.9 NA 136, K 3.9, co2 19 glu98, cr 0.89 CE 14,12 UA neg Cxr: MPRESSION: Interstitial opacities in the lower lungs may be due to mild edema or atypical infection.   CTH IMPRESSION: 1. No acute intracranial abnormality. 2. Almost complete opacification of left frontal sinus.  Subjective: Seen and examined.  She had no complaint when I saw her this morning.  She told me that she is very active person.  She would prefer to go home if cleared by cardiology.  Brief/Interim Summary: In short, patient was admitted for syncope.  There were some questions about this being cardiac in nature because apparently there were some PVCs noted in the ED but otherwise EKG was unremarkable.  Troponins were totally negative and her BNP was at her baseline.  Due to this concern, cardiology was consulted and they also opined that her syncope was not cardiac and they have cleared the patient for discharge and will see patient in the clinic on July 27, 2022.  Patient  also is very happy to go home.  She told me this morning that she is very active person, she likes to walk, she also further added that it was a beautiful day yesterday and she worked in the yard.  Based on the history, this is either vasovagal or due to orthostatic hypotension.  No changes in medications.  She is being discharged in stable condition.  Discharge plan was discussed with patient and/or family member and they verbalized understanding and agreed with it.  Discharge Diagnoses:   Principal Problem:   Syncope    Discharge Instructions   Allergies as of 07/16/2022   No Known Allergies      Medication List     TAKE these medications    acetaminophen 325 MG tablet Commonly known as: TYLENOL Take 2 tablets (650 mg total) by mouth every 6 (six) hours.   CALCIUM + D PO Take 2 tablets by mouth at bedtime.   cholecalciferol 25 MCG (1000 UNIT) tablet Commonly known as: VITAMIN D3 Take 1,000 Units by mouth daily after breakfast.   clopidogrel 75 MG tablet Commonly known as: PLAVIX Take 1 tablet by mouth once daily   cyanocobalamin 1000 MCG tablet Commonly known as: VITAMIN B12 Take 2,000 mcg by mouth daily.   Fish Oil 1200 MG Caps Take 1,200 mg by mouth at bedtime.   levothyroxine 75 MCG tablet Commonly known as: SYNTHROID Take 75 mcg by mouth daily before breakfast.   losartan 25 MG tablet Commonly known as: COZAAR Take 0.5 tablets (12.5 mg total) by mouth at bedtime.   multivitamin-lutein Caps capsule Take 1 capsule by mouth 2 (two) times a day.   nitroGLYCERIN 0.4 MG SL tablet Commonly known as: NITROSTAT Place 1 tablet (0.4 mg total) under the tongue every 5 (five) minutes as needed. What changed: reasons to take this   pantoprazole 40 MG tablet Commonly known as: PROTONIX Take 1 tablet (40 mg total) by mouth daily.   rosuvastatin 40 MG tablet Commonly known as: CRESTOR Take 1 tablet by mouth once daily   venlafaxine XR 150 MG 24 hr capsule Commonly known as: EFFEXOR-XR Take 1 capsule (150 mg total) by mouth daily with breakfast.        Follow-up Information     Marylu Lund., NP Follow up.   Specialty: Cardiology Why: [place] location - cardiology follow-up scheduled on Monday March 25 2:45 PM (Arrive by 2:30 PM). Jaquelyn Bitter is one of our nurse practitioners that works with Dr. Acie Fredrickson. Contact information: [address] [place] [address] [phone]         Crist Infante, MD Follow up in 1 week(s).   Specialty: Internal Medicine Contact information: [address] [place] [address] [phone]                No Known Allergies  Consultations: Cardiology   Procedures/Studies: CT Head Wo Contrast  Result Date: 07/15/2022 CLINICAL DATA:  Syncope/presyncope, cerebrovascular cause suspected EXAM: CT HEAD WITHOUT CONTRAST TECHNIQUE: Contiguous axial images were obtained from the base of the skull through the vertex without intravenous contrast. RADIATION DOSE REDUCTION: This exam was performed according to the departmental dose-optimization program which includes automated exposure control, adjustment of the mA and/or kV according to patient size and/or use of iterative reconstruction technique. COMPARISON:  CT head 11/12/2018. FINDINGS: Brain: Cerebral ventricle sizes are concordant with the degree of cerebral volume loss. Patchy and confluent areas of decreased attenuation are noted throughout  the deep and periventricular white matter of the cerebral hemispheres bilaterally, compatible with chronic microvascular ischemic disease. No evidence of large-territorial acute infarction. No parenchymal hemorrhage. No mass lesion. No extra-axial collection. No mass effect or midline shift. No hydrocephalus. Basilar cisterns are patent. Vascular: No hyperdense vessel. Atherosclerotic calcifications are present within the cavernous internal carotid and vertebral arteries. Skull: No acute fracture or focal lesion. Sinuses/Orbits: Left frontal sinus almost complete opacification. Paranasal sinuses and mastoid air cells are clear. The orbits are unremarkable. Other: None. IMPRESSION: 1. No acute intracranial abnormality. 2. Almost complete opacification of left frontal sinus. Electronically Signed   By: Iven Finn M.D.   On: 07/15/2022 20:41   DG Chest Port 1 View  Result Date: 07/15/2022 CLINICAL DATA:  Syncopal episode EXAM: PORTABLE CHEST 1 VIEW  COMPARISON:  03/20/2020 FINDINGS: Stable cardiomegaly. Aortic atherosclerotic calcification. Predominantly interstitial opacities in the lung bases. Bibasilar atelectasis/scarring. No pleural effusion or pneumothorax. No displaced rib fractures. Vertebroplasty of a midthoracic vertebral body. IMPRESSION: Interstitial opacities in the lower lungs may be due to mild edema or atypical infection. Electronically Signed   By: Placido Sou M.D.   On: 07/15/2022 19:29     Discharge Exam: Vitals:   07/16/22 1300 07/16/22 1500  BP: 136/72 105/63  Pulse: 94 82  Resp:  20  Temp: 97.8 F (36.6 C) 97.8 F (36.6 C)  SpO2: 99% 96%   Vitals:   07/16/22 1007 07/16/22 1214 07/16/22 1300 07/16/22 1500  BP:   136/72 105/63  Pulse:   94 82  Resp:    20  Temp: 97.7 F (36.5 C)  97.8 F (36.6 C) 97.8 F (36.6 C)  TempSrc: Oral  Oral Oral  SpO2:  96% 99% 96%  Weight:      Height:        General: Pt is alert, awake, not in acute distress Cardiovascular: RRR, S1/S2 +, no rubs, no gallops Respiratory: CTA bilaterally, no wheezing, no rhonchi Abdominal: Soft, NT, ND, bowel sounds + Extremities: no edema, no cyanosis    The results of significant diagnostics from this hospitalization (including imaging, microbiology, ancillary and laboratory) are listed below for reference.     Microbiology: No results found for this or any previous visit (from the past 240 hour(s)).   Labs: BNP (last 3 results) Recent Labs    07/15/22 1912  BNP [id number]*   Basic Metabolic Panel: Recent Labs  Lab 07/15/22 1912 07/16/22 0545  NA 136 139  K 3.9 3.7  CL 108 113*  CO2 19* 19*  GLUCOSE 98 104*  BUN 12 13  CREATININE 0.89 0.80  CALCIUM 8.3* 8.0*  MG  --  2.2   Liver Function Tests: Recent Labs  Lab 07/15/22 1912 07/16/22 0545  AST 29 22  ALT 16 15  ALKPHOS 54 50  BILITOT 0.5 0.6  PROT 6.7 5.9*  ALBUMIN 3.5 3.1*   No results for input(s): "LIPASE", "AMYLASE" in the last 168 hours. No results  for input(s): "AMMONIA" in the last 168 hours. CBC: Recent Labs  Lab 07/15/22 1912 07/16/22 0545  WBC 8.7 5.8  NEUTROABS 5.2  --   HGB 13.6 12.6  HCT 42.1 37.1  MCV 98.8 95.9  PLT 227 207   Cardiac Enzymes: No results for input(s): "CKTOTAL", "CKMB", "CKMBINDEX", "TROPONINI" in the last 168 hours. BNP: Invalid input(s): "POCBNP" CBG: No results for input(s): "GLUCAP" in the last 168 hours. D-Dimer No results for input(s): "DDIMER" in the last 72 hours. Hgb A1c No  results for input(s): "HGBA1C" in the last 72 hours. Lipid Profile No results for input(s): "CHOL", "HDL", "LDLCALC", "TRIG", "CHOLHDL", "LDLDIRECT" in the last 72 hours. Thyroid function studies No results for input(s): "TSH", "T4TOTAL", "T3FREE", "THYROIDAB" in the last 72 hours.  Invalid input(s): "FREET3" Anemia work up No results for input(s): "VITAMINB12", "FOLATE", "FERRITIN", "TIBC", "IRON", "RETICCTPCT" in the last 72 hours. Urinalysis    Component Value Date/Time   COLORURINE STRAW (A) 07/15/2022 1912   APPEARANCEUR CLEAR 07/15/2022 1912   LABSPEC 1.008 07/15/2022 1912   PHURINE 5.0 07/15/2022 1912   GLUCOSEU NEGATIVE 07/15/2022 1912   HGBUR NEGATIVE 07/15/2022 [place] NEGATIVE 07/15/2022 [place] NEGATIVE 07/15/2022 1912   PROTEINUR NEGATIVE 07/15/2022 1912   UROBILINOGEN 0.2 07/13/2011 2036   NITRITE NEGATIVE 07/15/2022 1912   LEUKOCYTESUR TRACE (A) 07/15/2022 1912   Sepsis Labs Recent Labs  Lab 07/15/22 1912 07/16/22 0545  WBC 8.7 5.8   Microbiology No results found for this or any previous visit (from the past 240 hour(s)).   Time coordinating discharge: Over 30 minutes  SIGNED:   Darliss Cheney, MD  [facility] 07/16/2022, 4:39 PM *Please note that this is a verbal dictation therefore any spelling or grammatical errors are due to the "[employer]" system interpretation. If 7PM-7AM, please contact night-coverage [email or web address]

## 2022-07-16 NOTE — ED Notes (Signed)
ED TO INPATIENT HANDOFF REPORT  ED Nurse Name and Phone #: [phone]  S Name/Age/Gender Susan [name] 89 y.o. female Room/Bed: 010C/010C  Code Status   Code Status: Full Code  Home/SNF/Other Home Patient oriented to: self, place, time, and situation Is this baseline? Yes   Triage Complete: Triage complete  Chief Complaint Syncope [R55]  Triage Note Per EMS pt had syncope episode. Pt was out for about 1 minute. Pt had a vomiting episode. Pt given 484m with EMS.   20G Left FA   Allergies No Known Allergies  Level of Care/Admitting Diagnosis ED Disposition     ED Disposition  Admit   Condition  --   Comment  [id number]  Level of Care: Progressive [102]  Admit to Progressive based on following criteria: CARDIOVASCULAR & THORACIC of moderate stability with acute coronary syndrome symptoms/low risk myocardial infarction/hypertensive urgency/arrhythmias/heart failure potentially compromising stability and stable post cardiovascular intervention patients.  [facility] level of care is available:: Yes  Covid Evaluation: Symptomatic Person Under Investigation (PUI) or recent exposure (last 10 days) *Testing Required*  Diagnosis: Syncope [206001]  Admitting Physician: TClance Boll[[id number] Attending Physician: TClance Boll[[id number] Certification:: I certify this patient will need inpatient services for at least 2 midnights  Estimated Length of Stay: 3          B Medical/Surgery History Past Medical History:  Diagnosis Date   Chronic HFrEF (heart failure with reduced ejection fraction) [place]    Coronary artery disease    PTCA/stenting of RCA 2005, moderate residual 70% stenosis of LAD stable by last cath 2021   Frequent PVCs    Hyperlipidemia    Hypothyroidism    LBBB (left bundle branch block)    Mild carotid artery disease ([facility]    Osteoporosis     Syncope    Past Surgical History:  Procedure Laterality Date   APPENDECTOMY  1970s?   "ruptured"    BACK SURGERY     CATARACT EXTRACTION W/ INTRAOCULAR LENS  IMPLANT, BILATERAL Bilateral    CORONARY ANGIOPLASTY WITH STENT PLACEMENT  ~ 2009   "2 stents"   FRACTURE SURGERY     IR VERTEBROPLASTY CERV/THOR BX INC UNI/BIL INC/INJECT/IMAGING  02/07/2018   KNEE ARTHROSCOPY Right     torn meniscus   LEFT HEART CATH AND CORONARY ANGIOGRAPHY N/A 03/21/2020   Procedure: LEFT HEART CATH AND CORONARY ANGIOGRAPHY;  Surgeon: BLorretta Harp MD;  [facility];  Service: Cardiovascular;  Laterality: N/A;   LUMBAR LAMINECTOMY  1978   OVARIAN CYST SURGERY  1970s?   "ruptured"    RIGHT/LEFT HEART CATH AND CORONARY ANGIOGRAPHY N/A 10/08/2017   Procedure: RIGHT/LEFT HEART CATH AND CORONARY ANGIOGRAPHY;  Surgeon: ENelva Bush MD;  [facility];  Service: Cardiovascular;  Laterality: N/A;   WRIST FRACTURE SURGERY Bilateral    "2 surgeries for 2 breaks on left side; 1 surgery for 1 break on right wrist"     A IV Location/Drains/Wounds Patient Lines/Drains/Airways Status     Active Line/Drains/Airways     Name Placement date Placement time Site Days   Peripheral IV 07/15/22 20 G Anterior;Left Forearm 07/15/22  1913  Forearm  1            Intake/Output Last 24 hours No intake or output data in the 24 hours ending 07/16/22 1546  Labs/Imaging Results for orders placed or  performed during the hospital encounter of 07/15/22 (from the past 48 hour(s))  Comprehensive metabolic panel     Status: Abnormal   Collection Time: 07/15/22  7:12 PM  Result Value Ref Range   Sodium 136 135 - 145 mmol/L   Potassium 3.9 3.5 - 5.1 mmol/L   Chloride 108 98 - 111 mmol/L   CO2 19 (L) 22 - 32 mmol/L   Glucose, Bld 98 70 - 99 mg/dL    Comment: Glucose reference range applies only to samples taken after fasting for at least 8 hours.   BUN 12 8 - 23 mg/dL   Creatinine, Ser 0.89  0.44 - 1.00 mg/dL   Calcium 8.3 (L) 8.9 - 10.3 mg/dL   Total Protein 6.7 6.5 - 8.1 g/dL   Albumin 3.5 3.5 - 5.0 g/dL   AST 29 15 - 41 U/L   ALT 16 0 - 44 U/L   Alkaline Phosphatase 54 38 - 126 U/L   Total Bilirubin 0.5 0.3 - 1.2 mg/dL   GFR, Estimated >60 >60 mL/min    Comment: (NOTE) Calculated using the CKD-EPI Creatinine Equation (2021)    Anion gap 9 5 - 15    Comment: Performed at [facility] [address], [place], [place] [address]  CBC with Differential     Status: None   Collection Time: 07/15/22  7:12 PM  Result Value Ref Range   WBC 8.7 4.0 - 10.5 K/uL   RBC 4.26 3.87 - 5.11 MIL/uL   Hemoglobin 13.6 12.0 - 15.0 g/dL   HCT 42.1 36.0 - 46.0 %   MCV 98.8 80.0 - 100.0 fL   MCH 31.9 26.0 - 34.0 pg   MCHC 32.3 30.0 - 36.0 g/dL   RDW 14.3 11.5 - 15.5 %   Platelets 227 150 - 400 K/uL   nRBC 0.0 0.0 - 0.2 %   Neutrophils Relative % 60 %   Neutro Abs 5.2 1.7 - 7.7 K/uL   Lymphocytes Relative 31 %   Lymphs Abs 2.7 0.7 - 4.0 K/uL   Monocytes Relative 7 %   Monocytes Absolute 0.6 0.1 - 1.0 K/uL   Eosinophils Relative 1 %   Eosinophils Absolute 0.1 0.0 - 0.5 K/uL   Basophils Relative 1 %   Basophils Absolute 0.0 0.0 - 0.1 K/uL   Immature Granulocytes 0 %   Abs Immature Granulocytes 0.01 0.00 - 0.07 K/uL    Comment: Performed at [facility], [address], [place], [place] [address]  Urinalysis, w/ Reflex to Culture (Infection Suspected) -Urine, Clean Catch     Status: Abnormal   Collection Time: 07/15/22  7:12 PM  Result Value Ref Range   Specimen Source URINE, CLEAN CATCH    Color, Urine STRAW (A) YELLOW   APPearance CLEAR CLEAR   Specific Gravity, Urine 1.008 1.005 - 1.030   pH 5.0 5.0 - 8.0   Glucose, UA NEGATIVE NEGATIVE mg/dL   Hgb urine dipstick NEGATIVE NEGATIVE   Bilirubin Urine NEGATIVE NEGATIVE   Ketones, ur NEGATIVE NEGATIVE mg/dL   Protein, ur NEGATIVE NEGATIVE mg/dL   Nitrite NEGATIVE NEGATIVE   Leukocytes,Ua TRACE (A) NEGATIVE   RBC /  HPF 0-5 0 - 5 RBC/hpf   WBC, UA 0-5 0 - 5 WBC/hpf    Comment:        Reflex urine culture not performed if WBC <=10, OR if Squamous epithelial cells >5. If Squamous epithelial cells >5 suggest recollection.    Bacteria, UA NONE  SEEN NONE SEEN   Squamous Epithelial / HPF 0-5 0 - 5 /HPF   Mucus PRESENT     Comment: Performed at [facility], [place] [address], [place], [place] [address]  Troponin I (High Sensitivity)     Status: None   Collection Time: 07/15/22  7:12 PM  Result Value Ref Range   Troponin I (High Sensitivity) 14 <18 ng/L    Comment: (NOTE) Elevated high sensitivity troponin I (hsTnI) values and significant  changes across serial measurements may suggest ACS but many other  chronic and acute conditions are known to elevate hsTnI results.  Refer to the "Links" section for chest pain algorithms and additional  guidance. Performed at [facility], [place] [address], [place], [place] [address]   Brain natriuretic peptide     Status: Abnormal   Collection Time: 07/15/22  7:12 PM  Result Value Ref Range   B Natriuretic Peptide 203.9 (H) 0.0 - 100.0 pg/mL    Comment: Performed at [facility] [address] [place], [place] [address]  Troponin I (High Sensitivity)     Status: None   Collection Time: 07/15/22 10:23 PM  Result Value Ref Range   Troponin I (High Sensitivity) 12 <18 ng/L    Comment: (NOTE) Elevated high sensitivity troponin I (hsTnI) values and significant  changes across serial measurements may suggest ACS but many other  chronic and acute conditions are known to elevate hsTnI results.  Refer to the "Links" section for chest pain algorithms and additional  guidance. Performed at [facility], [place] [address], [place], [place] [address]   CBC     Status: None   Collection Time: 07/16/22  5:45 AM  Result Value Ref Range   WBC 5.8 4.0 - 10.5 K/uL   RBC 3.87 3.87 - 5.11 MIL/uL   Hemoglobin 12.6 12.0 - 15.0 g/dL   HCT 37.1 36.0 - 46.0  %   MCV 95.9 80.0 - 100.0 fL   MCH 32.6 26.0 - 34.0 pg   MCHC 34.0 30.0 - 36.0 g/dL   RDW 14.3 11.5 - 15.5 %   Platelets 207 150 - 400 K/uL   nRBC 0.0 0.0 - 0.2 %    Comment: Performed at [facility], [place] [address] [place], [place] [address]  Comprehensive metabolic panel     Status: Abnormal   Collection Time: 07/16/22  5:45 AM  Result Value Ref Range   Sodium 139 135 - 145 mmol/L   Potassium 3.7 3.5 - 5.1 mmol/L   Chloride 113 (H) 98 - 111 mmol/L   CO2 19 (L) 22 - 32 mmol/L   Glucose, Bld 104 (H) 70 - 99 mg/dL    Comment: Glucose reference range applies only to samples taken after fasting for at least 8 hours.   BUN 13 8 - 23 mg/dL   Creatinine, Ser 0.80 0.44 - 1.00 mg/dL   Calcium 8.0 (L) 8.9 - 10.3 mg/dL   Total Protein 5.9 (L) 6.5 - 8.1 g/dL   Albumin 3.1 (L) 3.5 - 5.0 g/dL   AST 22 15 - 41 U/L   ALT 15 0 - 44 U/L   Alkaline Phosphatase 50 38 - 126 U/L   Total Bilirubin 0.6 0.3 - 1.2 mg/dL   GFR, Estimated >60 >60 mL/min    Comment: (NOTE) Calculated using the CKD-EPI Creatinine Equation (2021)    Anion gap 7 5 - 15    Comment: Performed at [facility], [facility] [address] [place], [place] [address]  Magnesium     Status: None   Collection Time: 07/16/22  5:45 AM  Result Value Ref Range   Magnesium 2.2 1.7 - 2.4 mg/dL    Comment: Performed at [facility] [address], [place], [place] [address]   CT Head Wo Contrast  Result Date: 07/15/2022 CLINICAL DATA:  Syncope/presyncope, cerebrovascular cause suspected EXAM: CT HEAD WITHOUT CONTRAST TECHNIQUE: Contiguous axial images were obtained from the base of the skull through the vertex without intravenous contrast. RADIATION DOSE REDUCTION: This exam was performed according to the departmental dose-optimization program which includes automated exposure control, adjustment of the mA and/or kV according to patient size and/or use of iterative reconstruction technique. COMPARISON:  CT head 11/12/2018.  FINDINGS: Brain: Cerebral ventricle sizes are concordant with the degree of cerebral volume loss. Patchy and confluent areas of decreased attenuation are noted throughout the deep and periventricular white matter of the cerebral hemispheres bilaterally, compatible with chronic microvascular ischemic disease. No evidence of large-territorial acute infarction. No parenchymal hemorrhage. No mass lesion. No extra-axial collection. No mass effect or midline shift. No hydrocephalus. Basilar cisterns are patent. Vascular: No hyperdense vessel. Atherosclerotic calcifications are present within the cavernous internal carotid and vertebral arteries. Skull: No acute fracture or focal lesion. Sinuses/Orbits: Left frontal sinus almost complete opacification. Paranasal sinuses and mastoid air cells are clear. The orbits are unremarkable. Other: None. IMPRESSION: 1. No acute intracranial abnormality. 2. Almost complete opacification of left frontal sinus. Electronically Signed   By: Iven Finn M.D.   On: 07/15/2022 20:41   DG Chest Port 1 View  Result Date: 07/15/2022 CLINICAL DATA:  Syncopal episode EXAM: PORTABLE CHEST 1 VIEW COMPARISON:  03/20/2020 FINDINGS: Stable cardiomegaly. Aortic atherosclerotic calcification. Predominantly interstitial opacities in the lung bases. Bibasilar atelectasis/scarring. No pleural effusion or pneumothorax. No displaced rib fractures. Vertebroplasty of a midthoracic vertebral body. IMPRESSION: Interstitial opacities in the lower lungs may be due to mild edema or atypical infection. Electronically Signed   By: Placido Sou M.D.   On: 07/15/2022 19:29    Pending Labs Unresulted Labs (From admission, onward)    None       Vitals/Pain Today's Vitals   07/16/22 1007 07/16/22 1214 07/16/22 1300 07/16/22 1500  BP:   136/72 105/63  Pulse:   94 82  Resp:    20  Temp: 97.7 F (36.5 C)  97.8 F (36.6 C) 97.8 F (36.6 C)  TempSrc: Oral  Oral Oral  SpO2:  96% 99% 96%  Weight:       Height:      PainSc:        Isolation Precautions No active isolations  Medications Medications  rosuvastatin (CRESTOR) tablet 40 mg (40 mg Oral Given 07/16/22 0914)  venlafaxine XR (EFFEXOR-XR) 24 hr capsule 150 mg (150 mg Oral Given 07/16/22 0858)  levothyroxine (SYNTHROID) tablet 75 mcg (75 mcg Oral Given 07/16/22 0551)  pantoprazole (PROTONIX) EC tablet 40 mg (40 mg Oral Given 07/16/22 0914)  clopidogrel (PLAVIX) tablet 75 mg (75 mg Oral Given 07/16/22 0914)  cyanocobalamin (VITAMIN B12) tablet 2,000 mcg (2,000 mcg Oral Given 07/16/22 0914)  multivitamin (PROSIGHT) tablet 1 tablet (1 tablet Oral Given 07/16/22 0931)  heparin injection 5,000 Units (5,000 Units Subcutaneous Not Given 07/16/22 1320)  albuterol (PROVENTIL) (2.5 MG/3ML) 0.083% nebulizer solution 2.5 mg (has no administration in time range)  doxycycline (VIBRAMYCIN) 100 mg in sodium chloride 0.9 % 250 mL IVPB (0 mg Intravenous  Stopped 07/16/22 1213)  amoxicillin-clavulanate (AUGMENTIN) 875-125 MG per tablet 1 tablet (1 tablet Oral Given 07/16/22 0552)    Mobility walks     Focused Assessments Cardiac Assessment Handoff:  Cardiac Rhythm: Normal sinus rhythm Lab Results  Component Value Date   CKTOTAL 90 07/14/2011   CKMB 2.9 07/14/2011   TROPONINI <0.30 07/14/2011   No results found for: "DDIMER" Does the Patient currently have chest pain? No    R Recommendations: See Admitting Provider Note  Report given to:   Additional Notes:

## 2022-07-16 NOTE — Care Management CC44 (Signed)
Condition Code 44 Documentation Completed  Patient Details  Name: Susan [name] MRN: [id number] Date of Birth: June 24, 1933   Condition Code 44 given:  Yes Patient signature on Condition Code 44 notice:  Yes Documentation of 2 MD's agreement:  Yes Code 44 added to claim:  Yes    Laurena Slimmer, RN 07/16/2022, 4:50 PM

## 2022-07-16 NOTE — Consult Note (Addendum)
Cardiology Consultation   Patient ID: [name] MRN: [id number]; DOB: 03-05-1934  Admit date: 07/15/2022 Date of Consult: 07/16/2022  PCP:  Crist Infante, [place] Providers Cardiologist:  Mertie Moores, MD   {  Patient Profile:   Susan [name] is a 89 y.o. female with a hx of CAD s/p PTCA/stenting of mid and distal RCA 2005 with residual moderate proximal LAD stenosis, prior near-syncope and syncope felt vasovagal, LBBB, chronic HFrEF NYHA II, known frequent PVCs, carotid artery disease (1-39% BICA 2019), pituitary adenoma, 3.2cm dilation of thoracic aortic arch by CT 2021 (normal by echo 10/2021), mild-moderate AI and moderate MR by echo 2023, hypothyroidism and HLD who is being seen 07/16/2022 for the evaluation of syncope at the request of Susan [name].  History of Present Illness:   Susan [name] has cardiac history significant for CAD s/p stenting to RCA in 2005. Although some notes reflect this was prompted by syncope, review of records show that cath was performed due to episodes of vague chest pain and weakness. Cath during that time also noted moderate disease of the LAD of 60% stenosis. Overall, her cath results have showed stable CAD and patent RCA stenting when comparing her 2019 and 2021 caths with last study 70% LAD. Also in 2019 she had a [place] of her carotids for routine exam that showed mild bilateral carotid disease, but also has been asymptomatic and no significant changes compared to prior in 2013.  Susan [name] also has known HFrEF, but has had close cardiology follow up and has never had interference with her ADLs. She denies any issues with swelling or SOB. She is very active and ambulates without assistance and spends much of her time at home in her garden. Her last echo in 10/27/2021 which showed an ejection fraction of 30 to 35% with moderate LV dysfunction, global hypokinesis, and grade 1 diastolic dysfunction.  This represents a slight reduction in ejection  fraction however with some improvement in diastolic dysfunction of the LV when compared to the 2021 echocardiogram which showed an EF of 35 to 40% with grade 2 diastolic dysfunction.    Her most recent outpatient appointment was in July of 2023 when she was referred to Dr. Caryl Comes (EP) for consideration of CRT P. After evaluations he was not found to be a candidate due to the thought that her cardiopathy was considered primarily nonischemic.  Instead, GDMT was recommended and Dr. Caryl Comes started losartan 12.5 mg.  He also noted during that time that she had frequent PVCs on current EKG but were not apparent on prior EKGs.  He did not feel that they were contributing to her cardiomyopathy since she was asymptomatic at the time with stable EF.  Yesterday 07/15/2022 she was admitted for possible syncope.  She states that yesterday she was at a church event and began to feel "off" stating that she felt the same way when she did during her cardiac event back in 2005, but unable to explicitly say how she felt, but admits she did feel a little lightheaded and got sweaty, but denied any preaura, chest pains, heart palpitations, racing heart rates, nausea, vision changes. She had breakfast that morning which compromised of cereal, banana, and some coffee with a couple glasses of water. She also had been spending quite a bit of time outside in her garden that day and had a light lunch of 4 crackers with peanut butter because she knew she would be eating more at the  church event later on. She believes she might have "overdone it". It is documented in her chart that she had a syncopal episode, but she states she remembers all the events surrounding her "passing out". She states that she she was standing and started to feel "off" and sat down and lay her head down and then people moved her to the floor to lay down and hooked up leads to her that were present at the church.   She denies prior history of a syncopal episode but  there are other documented admissions where she had possible syncopal episodes that were mainly considered to be more orthostatic in nature and in the presence of a lack of eating.   ED workup: Orthostatics not performed, but SBP nadir 97/75. ECG showing normal sinus rhythm with left bundle branch block and multiple PVCs but appearing at baseline.  Chest x-ray showing interstitial opacities that could be attributed to mild edema or atypical infection, normal glucose levels, CT head negative for acute bleed but showed evidence of sinusitis and treated with augmentin. No post ictal phase, no noted arrhythmias noted on EKG. Denies: chest pain, palpitations, racing heart, nausea, vomiting, visual changes, no documented low sugars.    Past Medical History:  Diagnosis Date   Chronic HFrEF (heart failure with reduced ejection fraction) ([place])    Coronary artery disease    PTCA/stenting of RCA 2005, moderate residual 70% stenosis of LAD stable by last cath 2021   Frequent PVCs    Hyperlipidemia    Hypothyroidism    LBBB (left bundle branch block)    Mild carotid artery disease ([place])    Osteoporosis    Syncope     Past Surgical History:  Procedure Laterality Date   APPENDECTOMY  1970s?   "ruptured"    BACK SURGERY     CATARACT EXTRACTION W/ INTRAOCULAR LENS  IMPLANT, BILATERAL Bilateral    CORONARY ANGIOPLASTY WITH STENT PLACEMENT  ~ 2009   "2 stents"   FRACTURE SURGERY     IR VERTEBROPLASTY CERV/THOR BX INC UNI/BIL INC/INJECT/IMAGING  02/07/2018   KNEE ARTHROSCOPY Right     torn meniscus   LEFT HEART CATH AND CORONARY ANGIOGRAPHY N/A 03/21/2020   Procedure: LEFT HEART CATH AND CORONARY ANGIOGRAPHY;  Surgeon: Lorretta Harp, MD;  Location: [facility];  Service: Cardiovascular;  Laterality: N/A;   LUMBAR LAMINECTOMY  1978   OVARIAN CYST SURGERY  1970s?   "ruptured"    RIGHT/LEFT HEART CATH AND CORONARY ANGIOGRAPHY N/A 10/08/2017   Procedure: RIGHT/LEFT HEART CATH AND CORONARY  ANGIOGRAPHY;  Surgeon: Nelva Bush, MD;  Location: [facility];  Service: Cardiovascular;  Laterality: N/A;   WRIST FRACTURE SURGERY Bilateral    "2 surgeries for 2 breaks on left side; 1 surgery for 1 break on right wrist"    Inpatient Medications: Scheduled Meds:  clopidogrel  75 mg Oral Daily   cyanocobalamin  2,000 mcg Oral Daily   heparin  5,000 Units Subcutaneous Q8H   levothyroxine  75 mcg Oral Q0600   multivitamin  1 tablet Oral Daily   pantoprazole  40 mg Oral Daily   rosuvastatin  40 mg Oral Daily   venlafaxine XR  150 mg Oral Q breakfast   Continuous Infusions:  doxycycline (VIBRAMYCIN) IV Stopped (07/16/22 1213)   PRN Meds: albuterol  Allergies:   No Known Allergies  Social History:   Social History   Socioeconomic History   Marital status: Widowed    Spouse name: Not on  file   Number of children: Not on file   Years of education: Not on file   Highest education level: Not on file  Occupational History   Not on file  Tobacco Use   Smoking status: Never   Smokeless tobacco: Never  Vaping Use   Vaping Use: Never used  Substance and Sexual Activity   Alcohol use: Yes    Alcohol/week: 1.0 standard drink of alcohol    Types: 1 Glasses of wine per week   Drug use: Never   Sexual activity: Not on file  Other Topics Concern   Not on file  Social History Narrative   Not on file   Social Determinants of Health   Financial Resource Strain: Not on file  Food Insecurity: Not on file  Transportation Needs: Not on file  Physical Activity: Not on file  Stress: Not on file  Social Connections: Not on file  Intimate Partner Violence: Not on file    Family History:   Family History  Problem Relation Age of Onset   Alzheimer's disease Mother    Heart attack Father    Aneurysm Sister      ROS:  Please see the history of present illness.  All other ROS reviewed and negative.     Physical Exam/Data:   Vitals:   07/16/22 1007 07/16/22 1214  07/16/22 1300 07/16/22 1500  BP:   136/72 105/63  Pulse:   94 82  Resp:    20  Temp: 97.7 F (36.5 C)  97.8 F (36.6 C) 97.8 F (36.6 C)  TempSrc: Oral  Oral Oral  SpO2:  96% 99% 96%  Weight:      Height:       No intake or output data in the 24 hours ending 07/16/22 1543    07/15/2022    6:59 PM 11/20/2021    9:26 AM 10/06/2021    9:03 AM  Last 3 Weights  Weight (lbs) 130 lb 131 lb 6.4 oz 130 lb 6.4 oz  Weight (kg) 58.968 kg 59.603 kg 59.149 kg     Body mass index is 23.03 kg/m.  General:  Well nourished, well developed, in no acute distress HEENT: normal Neck: no JVD Vascular: No carotid bruits; Distal pulses 2+ bilaterally Cardiac:  normal S1, S2; RRR; no murmur  Lungs:  clear to auscultation bilaterally, no wheezing, rhonchi or rales  Abd: soft, nontender, no hepatomegaly  Ext: no edema Musculoskeletal:  No deformities, BUE and BLE strength normal and equal Skin: warm and dry  Neuro:  CNs 2-12 intact, no focal abnormalities noted Psych:  Normal affect   EKG:  The EKG was personally reviewed and demonstrates:  NSR with PVCs and LBBB Telemetry:  Telemetry was personally reviewed and demonstrates:  NSR with occasional PVCs and LBBB rate of 70-85, one PVC triplet but no sustained events  Relevant CV Studies:   TTE 10/27/2021  Left Ventricle: Left ventricular ejection fraction, by estimation, is 30  to 35%. The left ventricle has moderately decreased function. The left  ventricle demonstrates global hypokinesis. The left ventricular internal  cavity size was normal in size.  There is mild left ventricular hypertrophy. Left ventricular diastolic  parameters are consistent with Grade I diastolic dysfunction (impaired  relaxation).   Right Ventricle: The right ventricular size is normal. No increase in  right ventricular wall thickness. Right ventricular systolic function was  not well visualized.   Left Atrium: Left atrial size was mildly dilated.   Right Atrium:  Right atrial size was normal in size.   Pericardium: There is no evidence of pericardial effusion.   Mitral Valve: There is moderate thickening of the mitral valve leaflet(s).  Mild to moderate mitral annular calcification. Moderate mitral valve  regurgitation.   Tricuspid Valve: The tricuspid valve is myxomatous. Tricuspid valve  regurgitation is mild to moderate.   Aortic Valve: The aortic valve is tricuspid. Aortic valve regurgitation is  mild to moderate. Aortic regurgitation PHT measures 517 msec. Aortic valve  sclerosis is present, with no evidence of aortic valve stenosis.   Pulmonic Valve: The pulmonic valve was normal in structure. Pulmonic valve  regurgitation is moderate.   Aorta: The aortic root and ascending aorta are structurally normal, with  no evidence of dilitation.   Venous: The inferior vena cava is normal in size with greater than 50%  respiratory variability, suggesting right atrial pressure of 3 mmHg.   IAS/Shunts: No atrial level shunt detected by color flow Doppler.   Left heart catherization 03/21/2020 Previously placed Prox RCA to Mid RCA stent (unknown type) is widely patent. Previously placed Dist RCA stent (unknown type) is widely patent. Mid LAD lesion is 70% stenosed.  Laboratory Data:  High Sensitivity Troponin:   Recent Labs  Lab 07/15/22 1912 07/15/22 2223  TROPONINIHS 14 12     Chemistry Recent Labs  Lab 07/15/22 1912 07/16/22 0545  NA 136 139  K 3.9 3.7  CL 108 113*  CO2 19* 19*  GLUCOSE 98 104*  BUN 12 13  CREATININE 0.89 0.80  CALCIUM 8.3* 8.0*  MG  --  2.2  GFRNONAA >60 >60  ANIONGAP 9 7    Recent Labs  Lab 07/15/22 1912 07/16/22 0545  PROT 6.7 5.9*  ALBUMIN 3.5 3.1*  AST 29 22  ALT 16 15  ALKPHOS 54 50  BILITOT 0.5 0.6   Lipids No results for input(s): "CHOL", "TRIG", "HDL", "LABVLDL", "LDLCALC", "CHOLHDL" in the last 168 hours.  Hematology Recent Labs  Lab 07/15/22 1912 07/16/22 0545  WBC 8.7 5.8  RBC  4.26 3.87  HGB 13.6 12.6  HCT 42.1 37.1  MCV 98.8 95.9  MCH 31.9 32.6  MCHC 32.3 34.0  RDW 14.3 14.3  PLT 227 207   Thyroid No results for input(s): "TSH", "FREET4" in the last 168 hours.  BNP Recent Labs  Lab 07/15/22 1912  BNP 203.9*    DDimer No results for input(s): "DDIMER" in the last 168 hours.   Radiology/Studies:  CT Head Wo Contrast  Result Date: 07/15/2022 CLINICAL DATA:  Syncope/presyncope, cerebrovascular cause suspected EXAM: CT HEAD WITHOUT CONTRAST TECHNIQUE: Contiguous axial images were obtained from the base of the skull through the vertex without intravenous contrast. RADIATION DOSE REDUCTION: This exam was performed according to the departmental dose-optimization program which includes automated exposure control, adjustment of the mA and/or kV according to patient size and/or use of iterative reconstruction technique. COMPARISON:  CT head 11/12/2018. FINDINGS: Brain: Cerebral ventricle sizes are concordant with the degree of cerebral volume loss. Patchy and confluent areas of decreased attenuation are noted throughout the deep and periventricular white matter of the cerebral hemispheres bilaterally, compatible with chronic microvascular ischemic disease. No evidence of large-territorial acute infarction. No parenchymal hemorrhage. No mass lesion. No extra-axial collection. No mass effect or midline shift. No hydrocephalus. Basilar cisterns are patent. Vascular: No hyperdense vessel. Atherosclerotic calcifications are present within the cavernous internal carotid and vertebral arteries. Skull: No acute fracture or focal lesion. Sinuses/Orbits: Left frontal sinus almost complete opacification.  Paranasal sinuses and mastoid air cells are clear. The orbits are unremarkable. Other: None. IMPRESSION: 1. No acute intracranial abnormality. 2. Almost complete opacification of left frontal sinus. Electronically Signed   By: Iven Finn M.D.   On: 07/15/2022 20:41   DG Chest Port  1 View  Result Date: 07/15/2022 CLINICAL DATA:  Syncopal episode EXAM: PORTABLE CHEST 1 VIEW COMPARISON:  03/20/2020 FINDINGS: Stable cardiomegaly. Aortic atherosclerotic calcification. Predominantly interstitial opacities in the lung bases. Bibasilar atelectasis/scarring. No pleural effusion or pneumothorax. No displaced rib fractures. Vertebroplasty of a midthoracic vertebral body. IMPRESSION: Interstitial opacities in the lower lungs may be due to mild edema or atypical infection. Electronically Signed   By: Placido Sou M.D.   On: 07/15/2022 19:29     Assessment and Plan:   Possible syncope  Overall, patients HPI and S/S do not appear to be cardiogenic in nature. Based off her report of the events, she was aware the entire time and admits to working out in the garden all day with decreased intake of fluid and food (light lunch and small breakfast).  There is no presence of a post ictal phase, no seizure like activity, no history of DM, no EKG changes noted on 12 lead or telemetry, normal neuro exam, neg CT. In addition, patients cardiac history has been overall stable with no significant change in disease progression or symptoms. She has had repeat routine bilateral carotid US in 2019 and 2013 indicating no disease progression and mild disease. No carotid bruits ausculted. Considering her prodromal feelings of lightheadedness and sweatiness I think vasovagal syncope would be more likely.  - will discuss further evaluation with MD in context of known LV dysfunction + LBBB - can consider repeat carotid US,14 day Holter monitor for possible arrhythmias, orthostatic vital signs, and repeating echo  *per preliminary discussion with Dr. Harrington Challenger she believes she can be managed with close follow up outpatient and does not require further inpatient evaluation.   Chronic HFrEF NYHA II Her last echo in 10/27/2021 showed an ejection fraction of 30 to 35% with moderate LV dysfunction, global hypokinesis, and  grade 1 diastolic dysfunction.  This represents a slight reduction in ejection fraction however with some improvement in diastolic dysfunction of the LV when compared to the 2021 echocardiogram which showed an EF of 35 to 40% with grade 2 diastolic dysfunction. Overall, asymptomatic and without interference of ADLs. It is unclear exactly what she is taking because chart medication from prior notes, differ from hospital med list, and from what she states. - need medication reconciliation  - patient appears euvolemic on exam with no complaints of SOB or leg swelling. BNP 203 and appears to be in the range of her baseline.   CAD s/p RCA DES with moderate disease of proximal LAD (2005) HLD Appears stable with multiple caths indicating patent RCA and no significant progression of LAD stenosis. Denies any chest pain, neg trops  - currently on Plavix '75mg'$  daily (monotherapy appears chronic) and rosuvastatin '40mg'$  daily, no longer on ASA  Dilation of thoracic aortic arch 3.2cm in 2021 with recommendation for annual f/u, though echo 10/2021 showed structurally normal, can continue to follow as OP  Mild-moderate AI and moderate MR by echo 10/2021 Continue outpatient surveillance    Risk Assessment/Risk Scores:   New York Heart Association (NYHA) Functional Class NYHA Class II  For questions or updates, please contact [place] Please consult [email or web address] for contact info under    Signed, Bonnee Quin, PA-C  07/16/2022 3:43 PM   Patient seen and examined   I agree with findings as noted above by Renette Butters The pt is an 89 yo with hx of CAD, ICM   Presents to ER today feeling weak, dizzy.    Had been outside in yard, did not eat much for lunch, then went to church     Says she felt like she did prior to her PCI Here in ER, she has received hydration.   Feeling better.  Walked around ER without symptoms   On exam Pt appears comfortable JVP is normal Lungs are CTA Cardiac exam   RRR  No  S3   No murmurs Abd benign Ext are without edema   I think patient's spell today was most likely due to mild dehydration.   I do not think it represents any signficant new cardiac abnormality     She is feeling better now  OK to D/C home   Will make sure she has close follow up      I have asked her to stay hydrated,    Also, keep logs of BP   Bring this and BP cuff to next appt.  Dorris Carnes MD

## 2022-07-16 NOTE — ED Notes (Signed)
Pt walked to the bathroom without assistance.

## 2022-07-16 NOTE — Care Management Obs Status (Signed)
[facility] NOTIFICATION   Patient Details  Name: Susan [name] MRN: [id number] Date of Birth: April 21, 1934   Medicare Observation Status Notification Given:  Ernesta Amble, RN 07/16/2022, 4:50 PM

## 2022-07-16 NOTE — H&P (Addendum)
History and Physical    Susan [name] [id number] DOB: November 10, 1933 DOA: 07/15/2022  PCP: Crist Infante, MD  Patient coming from: [name]  I have personally briefly reviewed patient's old medical records in [place]  Chief Complaint: syncope  HPI: Susan [name] is a 89 y.o. female with medical history significant of  CAD s/p MI that presented as syncope s/p  stent RCA 10 years ago, Ischemic CMY, CHFref Class II, PVC-LVOT,LBBB,hypothyroidism who presents from [place] after as syncopal episode. Patient states prior to this episode she had felt her normal self other that subacute right sided sinus headache which she states she has had for month due to her chronic sinusitis.  Patient states prior to episode she felt faint and weak. She states due to feeling unwell she when to sit at a table. She states she was told she passed out. She states she felt very weak and noted that she remembers being placed on the floor.She notes no chest pain , no palpitation, no nausea or vomiting prior to episode or directly after episode.  She denies any bladder or bowel incontinence. She notes no focal weakness or difficulty with speech or swallowing. She currently however that after eating in the ED she had episode of indigestion and nausea , diarrhea, she notes after treatment these symptoms have improved. ED Course: \ Vitals: temp 96.9, bp 136/59, hr 88, rr 22, sat 100%  [id number] rhythm multiple pvc , LBBB Wbc 8.7, hgb 13.6, plt 227 bNP 203.9 NA 136, K 3.9, co2 19 glu98, cr 0.89 CE 14,12 UA neg Cxr: MPRESSION: Interstitial opacities in the lower lungs may be due to mild edema or atypical infection.  CTH IMPRESSION: 1. No acute intracranial abnormality. 2. Almost complete opacification of left frontal sinus.    Review of Systems: As per HPI otherwise 10 point review of systems negative.   Past Medical History:  Diagnosis Date   Coronary artery disease    POST PTCA AND STENTING OF HER  RIGHT CORONARY ARTERY   Hypothyroidism    LAD stenosis    MODERATE 50-60% STENOSIS   Osteoporosis     Past Surgical History:  Procedure Laterality Date   APPENDECTOMY  1970s?   "ruptured"    BACK SURGERY     CATARACT EXTRACTION W/ INTRAOCULAR LENS  IMPLANT, BILATERAL Bilateral    CORONARY ANGIOPLASTY WITH STENT PLACEMENT  ~ 2009   "2 stents"   FRACTURE SURGERY     IR VERTEBROPLASTY CERV/THOR BX INC UNI/BIL INC/INJECT/IMAGING  02/07/2018   KNEE ARTHROSCOPY Right     torn meniscus   LEFT HEART CATH AND CORONARY ANGIOGRAPHY N/A 03/21/2020   Procedure: LEFT HEART CATH AND CORONARY ANGIOGRAPHY;  Surgeon: Lorretta Harp, MD;  Location: [facility];  Service: Cardiovascular;  Laterality: N/A;   LUMBAR LAMINECTOMY  1978   OVARIAN CYST SURGERY  1970s?   "ruptured"    RIGHT/LEFT HEART CATH AND CORONARY ANGIOGRAPHY N/A 10/08/2017   Procedure: RIGHT/LEFT HEART CATH AND CORONARY ANGIOGRAPHY;  Surgeon: Nelva Bush, MD;  Location: [facility];  Service: Cardiovascular;  Laterality: N/A;   WRIST FRACTURE SURGERY Bilateral    "2 surgeries for 2 breaks on left side; 1 surgery for 1 break on right wrist"     reports that she has never smoked. She has never used smokeless tobacco. She reports current alcohol use of about 1.0 standard drink of alcohol per week. She reports that she does not use drugs.  No Known Allergies  Family History  Problem Relation Age of Onset   Alzheimer's disease Mother    Heart attack Father    Aneurysm Sister     Prior to Admission medications   Medication Sig Start Date End Date Taking? Authorizing Provider  acetaminophen (TYLENOL) 325 MG tablet Take 2 tablets (650 mg total) by mouth every 6 (six) hours. 11/14/18  Yes Johnson, Clanford L, MD  Calcium Carbonate-Vitamin D (CALCIUM + D PO) Take 2 tablets by mouth at bedtime.   Yes [provider]  cholecalciferol (VITAMIN D3) 25 MCG (1000 UT) tablet Take 1,000 Units by mouth daily after  breakfast.   Yes [provider]  clopidogrel (PLAVIX) 75 MG tablet Take 1 tablet by mouth once daily 11/18/21  Yes Nahser, Wonda Cheng, MD  levothyroxine (SYNTHROID, LEVOTHROID) 75 MCG tablet Take 75 mcg by mouth daily before breakfast.  11/30/17  Yes [provider]  losartan (COZAAR) 25 MG tablet Take 0.5 tablets (12.5 mg total) by mouth at bedtime. 11/20/21  Yes Deboraha Sprang, MD  multivitamin-lutein [facility]) CAPS capsule Take 1 capsule by mouth 2 (two) times a day.    Yes [provider]  nitroGLYCERIN (NITROSTAT) 0.4 MG SL tablet Place 1 tablet (0.4 mg total) under the tongue every 5 (five) minutes as needed. Patient taking differently: Place 0.4 mg under the tongue every 5 (five) minutes as needed for chest pain. 06/22/11  Yes Nahser, Wonda Cheng, MD  Omega-3 Fatty Acids (FISH OIL) 1200 MG CAPS Take 1,200 mg by mouth at bedtime.   Yes [provider]  pantoprazole (PROTONIX) 40 MG tablet Take 1 tablet (40 mg total) by mouth daily. 08/22/21  Yes Nahser, Wonda Cheng, MD  rosuvastatin (CRESTOR) 40 MG tablet Take 1 tablet by mouth once daily Patient taking differently: Take 40 mg by mouth daily. 10/22/21  Yes Nahser, Wonda Cheng, MD  venlafaxine XR (EFFEXOR-XR) 150 MG 24 hr capsule Take 1 capsule (150 mg total) by mouth daily with breakfast. 11/18/21  Yes Nahser, Wonda Cheng, MD  vitamin B-12 (CYANOCOBALAMIN) 1000 MCG tablet Take 2,000 mcg by mouth daily.    Yes [provider]    Physical Exam: Vitals:   07/15/22 2300 07/16/22 0000 07/16/22 0002 07/16/22 0016  BP: 106/71 97/75    Pulse: 92 94 86 87  Resp:  '17 16 18  '$ Temp:      TempSrc:      SpO2: 95% 97% 96% 95%  Weight:      Height:        Constitutional: NAD, calm, comfortable Vitals:   07/15/22 2300 07/16/22 0000 07/16/22 0002 07/16/22 0016  BP: 106/71 97/75    Pulse: 92 94 86 87  Resp:  '17 16 18  '$ Temp:      TempSrc:      SpO2: 95% 97% 96% 95%  Weight:      Height:       Eyes: lids and  conjunctivae normal ENMT: Mucous membranes are moist. Posterior pharynx clear of any exudate or lesions.Normal dentition.  Neck: normal, supple, no masses, no thyromegaly Respiratory: clear to auscultation bilaterally, no wheezing, no crackles. Normal respiratory effort. No accessory muscle use.  Cardiovascular: Regular rate and rhythm, no murmurs / rubs / gallops. No extremity edema. 2+ pedal pulses. No carotid bruits.  Abdomen: no tenderness, no masses palpated. No hepatosplenomegaly. Bowel sounds positive.  Musculoskeletal: no clubbing / cyanosis. No joint deformity upper and lower extremities. Good ROM, no contractures. Normal muscle tone.  Skin: no rashes,  lesions, ulcers. No induration Neurologic: CN 2-12 grossly intact. Sensation intact. Strength 5/5 in all 4.  Psychiatric: Normal judgment and insight. Alert and oriented x 3. Normal mood.    Labs on Admission: I have personally reviewed following labs and imaging studies  CBC: Recent Labs  Lab 07/15/22 1912  WBC 8.7  NEUTROABS 5.2  HGB 13.6  HCT 42.1  MCV 98.8  PLT [id number]   Basic Metabolic Panel: Recent Labs  Lab 07/15/22 1912  NA 136  K 3.9  CL 108  CO2 19*  GLUCOSE 98  BUN 12  CREATININE 0.89  CALCIUM 8.3*   GFR: Estimated Creatinine Clearance: 37.5 mL/min (by C-G formula based on SCr of 0.89 mg/dL). Liver Function Tests: Recent Labs  Lab 07/15/22 1912  AST 29  ALT 16  ALKPHOS 54  BILITOT 0.5  PROT 6.7  ALBUMIN 3.5   No results for input(s): "LIPASE", "AMYLASE" in the last 168 hours. No results for input(s): "AMMONIA" in the last 168 hours. Coagulation Profile: No results for input(s): "INR", "PROTIME" in the last 168 hours. Cardiac Enzymes: No results for input(s): "CKTOTAL", "CKMB", "CKMBINDEX", "TROPONINI" in the last 168 hours. BNP (last 3 results) No results for input(s): "PROBNP" in the last 8760 hours. HbA1C: No results for input(s): "HGBA1C" in the last 72 hours. CBG: No results for  input(s): "GLUCAP" in the last 168 hours. Lipid Profile: No results for input(s): "CHOL", "HDL", "LDLCALC", "TRIG", "CHOLHDL", "LDLDIRECT" in the last 72 hours. Thyroid Function Tests: No results for input(s): "TSH", "T4TOTAL", "FREET4", "T3FREE", "THYROIDAB" in the last 72 hours. Anemia Panel: No results for input(s): "VITAMINB12", "FOLATE", "FERRITIN", "TIBC", "IRON", "RETICCTPCT" in the last 72 hours. Urine analysis:    Component Value Date/Time   COLORURINE STRAW (A) 07/15/2022 1912   APPEARANCEUR CLEAR 07/15/2022 1912   LABSPEC 1.008 07/15/2022 1912   PHURINE 5.0 07/15/2022 1912   GLUCOSEU NEGATIVE 07/15/2022 1912   HGBUR NEGATIVE 07/15/2022 1912   BILIRUBINUR NEGATIVE 07/15/2022 1912   KETONESUR NEGATIVE 07/15/2022 1912   PROTEINUR NEGATIVE 07/15/2022 1912   UROBILINOGEN 0.2 07/13/2011 2036   NITRITE NEGATIVE 07/15/2022 1912   LEUKOCYTESUR TRACE (A) 07/15/2022 1912    Radiological Exams on Admission: CT Head Wo Contrast  Result Date: 07/15/2022 CLINICAL DATA:  Syncope/presyncope, cerebrovascular cause suspected EXAM: CT HEAD WITHOUT CONTRAST TECHNIQUE: Contiguous axial images were obtained from the base of the skull through the vertex without intravenous contrast. RADIATION DOSE REDUCTION: This exam was performed according to the departmental dose-optimization program which includes automated exposure control, adjustment of the mA and/or kV according to patient size and/or use of iterative reconstruction technique. COMPARISON:  CT head 11/12/2018. FINDINGS: Brain: Cerebral ventricle sizes are concordant with the degree of cerebral volume loss. Patchy and confluent areas of decreased attenuation are noted throughout the deep and periventricular white matter of the cerebral hemispheres bilaterally, compatible with chronic microvascular ischemic disease. No evidence of large-territorial acute infarction. No parenchymal hemorrhage. No mass lesion. No extra-axial collection. No mass  effect or midline shift. No hydrocephalus. Basilar cisterns are patent. Vascular: No hyperdense vessel. Atherosclerotic calcifications are present within the cavernous internal carotid and vertebral arteries. Skull: No acute fracture or focal lesion. Sinuses/Orbits: Left frontal sinus almost complete opacification. Paranasal sinuses and mastoid air cells are clear. The orbits are unremarkable. Other: None. IMPRESSION: 1. No acute intracranial abnormality. 2. Almost complete opacification of left frontal sinus. Electronically Signed   By: Iven Finn M.D.   On: 07/15/2022 20:41   [facility]  1 View  Result Date: 07/15/2022 CLINICAL DATA:  Syncopal episode EXAM: PORTABLE CHEST 1 VIEW COMPARISON:  03/20/2020 FINDINGS: Stable cardiomegaly. Aortic atherosclerotic calcification. Predominantly interstitial opacities in the lung bases. Bibasilar atelectasis/scarring. No pleural effusion or pneumothorax. No displaced rib fractures. Vertebroplasty of a midthoracic vertebral body. IMPRESSION: Interstitial opacities in the lower lungs may be due to mild edema or atypical infection. Electronically Signed   By: Placido Sou M.D.   On: 07/15/2022 19:29    EKG: Independently reviewed. See above  Assessment/Plan   Syncope  -possible cardiac in origin due to arrhythmia  -noted multiple pvc in ED, patient has hx  - possible related to dx of LVOT  -admit to cardiac tele  - echo in am  - check orthostatics  -of note neuro exam nonfocal and CT negative  -UA negative as well , however cxr ? CAP  CAP -CT thorax noncontrast  -patient w/o sob /cough congestion  -but does have diagnosis of chronic sinusitis  - doxycyline, deescalate as able  -place on CAP protocol  -urine ag ordered  -pulmonary toilet    CAD s/p MI s/p remote stent -ce current negative x 2 , EKG unchanged from prior -resume home regimen  arb, plavix   Hypothyroidism -resume home regimen   GERD -ppi  Depression -continue  effexor   HLD -continue fishoil   DVT prophylaxis: lovenox  Code Status: full/ as discussed per patient wishes in event of cardiac arrest  Family Communication: none at bedside Disposition Plan: patient  expected to be admitted greater than 2 midnights  Consults called:  please consult cardiology in am  Admission status: cardiac tele   Clance Boll MD [facility]   If 7PM-7AM, please contact night-coverage [email or web address] Password [email or web address]  07/16/2022, 2:20 AM

## 2022-07-23 ENCOUNTER — Other Ambulatory Visit: Payer: Self-pay | Admitting: Cardiovascular Disease

## 2022-07-26 NOTE — Progress Notes (Addendum)
Office Visit    Patient Name: Susan [name] Date of Encounter: 07/26/2022  Primary Care Provider:  Crist Infante, MD Primary Cardiologist:  Mertie Moores, MD Primary Electrophysiologist: None  Chief Complaint    Susan [name] is a 89 y.o. female with PMH of CAD s/p MI treated with PTCA/stenting of mid and distal RCA 2005 with proximal moderate LAD stenosis, LBBB, HFrEF, NICM, frequent PVCs, carotid artery disease (1-39% bilaterally), dilation of thoracic aorta HLD, hypothyroidism who presents today for follow-up of syncope.  Past Medical History    Past Medical History:  Diagnosis Date   Chronic HFrEF (heart failure with reduced ejection fraction) ([place])    Coronary artery disease    PTCA/stenting of RCA 2005, moderate residual 70% stenosis of LAD stable by last cath 2021   Frequent PVCs    Hyperlipidemia    Hypothyroidism    LBBB (left bundle branch block)    Mild carotid artery disease ([place])    Osteoporosis    Syncope    Past Surgical History:  Procedure Laterality Date   APPENDECTOMY  1970s?   "ruptured"    BACK SURGERY     CATARACT EXTRACTION W/ INTRAOCULAR LENS  IMPLANT, BILATERAL Bilateral    CORONARY ANGIOPLASTY WITH STENT PLACEMENT  ~ 2009   "2 stents"   FRACTURE SURGERY     IR VERTEBROPLASTY CERV/THOR BX INC UNI/BIL INC/INJECT/IMAGING  02/07/2018   KNEE ARTHROSCOPY Right     torn meniscus   LEFT HEART CATH AND CORONARY ANGIOGRAPHY N/A 03/21/2020   Procedure: LEFT HEART CATH AND CORONARY ANGIOGRAPHY;  Surgeon: Lorretta Harp, MD;  Location: [facility];  Service: Cardiovascular;  Laterality: N/A;   LUMBAR LAMINECTOMY  1978   OVARIAN CYST SURGERY  1970s?   "ruptured"    RIGHT/LEFT HEART CATH AND CORONARY ANGIOGRAPHY N/A 10/08/2017   Procedure: RIGHT/LEFT HEART CATH AND CORONARY ANGIOGRAPHY;  Surgeon: Nelva Bush, MD;  Location: [facility];  Service: Cardiovascular;  Laterality: N/A;   WRIST FRACTURE SURGERY Bilateral    "2 surgeries  for 2 breaks on left side; 1 surgery for 1 break on right wrist"    Allergies  No Known Allergies  History of Present Illness    Susan [name]  is a 89 year old female with the above mention past medical history who presents today for recent hospitalization for complaint of syncope.  Susan [name] has a cardiac history of anterior septal MI in 2005 that was treated with PTCA/DES to RCA.  She had subsequent left heart cath completed with most recent being in 2021 showing 70% LAD and otherwise unchanged anatomy.  Most recent 2D echo was completed 10/2021 with EF of 30-35% and moderately decreased LV function with global hypokinesis and mild LVH with mildly dilated LA moderate MVR mild to moderate TVR and PV regurgitation.  She was referred to EP for consideration of BiV pacemaker however was deemed not a candidate due to cardiomyopathy being nonischemic.  GDMT was titrated and losartan 12.5 mg was added to her current therapy.  She was seen in the ED on 07/16/2022 for complaint of syncope.  She was attending a church event and felt similar to her cardiac event in 2005.  She does also admit to getting lightheaded and sweaty.  She does note having suffered sinusitis and had a right-sided sinus headache that has been going on for months due to chronic sinusitis.  Orthostatic blood pressures were not completed and EKG showed PVCs and  LBBB which is her baseline.  CT of the head was negative for acute changes and labs did not show any acute changes.  She was deemed necessary for discharge with close cardiac follow-up.  No further cardiac tests were performed during her admission.  Susan [name] presents today for post ED follow-up alone.  Since last being seen in the office patient reports that she is feeling much better and has not experienced any presyncopal episodes since ED visit.  Her blood pressure today is well-controlled at 98/62 and patient's heart rate is 69 bpm.  She is compliant with her current  medications and denies any adverse reactions.  During today's visit Susan [name] was curious to know if her carvedilol was discontinued by her PCP or her cardiologist.  I searched her note and discovered that Dr. Elease Hashimoto discontinued her carvedilol due to new LBBB on EKG.  Susan [name] is very active and walks 35 to 45 minutes/day when weather permits.  She reports that her syncopal event may have occurred due to dehydration.  She usually eats a big dinner on Wednesdays and had only eaten a few crackers and green tea on the day of her syncope.  We discussed increasing fluid intake and curving meals with Boost or Ensure in the future.  Patient denies chest pain, palpitations, dyspnea, PND, orthopnea, nausea, vomiting, dizziness, syncope, edema, weight gain, or early satiety.  Home Medications    Current Outpatient Medications  Medication Sig Dispense Refill   acetaminophen (TYLENOL) 325 MG tablet Take 2 tablets (650 mg total) by mouth every 6 (six) hours.     Calcium Carbonate-Vitamin D (CALCIUM + D PO) Take 2 tablets by mouth at bedtime.     cholecalciferol (VITAMIN D3) 25 MCG (1000 UT) tablet Take 1,000 Units by mouth daily after breakfast.     clopidogrel (PLAVIX) 75 MG tablet Take 1 tablet by mouth once daily 90 tablet 3   levothyroxine (SYNTHROID, LEVOTHROID) 75 MCG tablet Take 75 mcg by mouth daily before breakfast.   0   losartan (COZAAR) 25 MG tablet Take 0.5 tablets (12.5 mg total) by mouth at bedtime. 45 tablet 3   multivitamin-lutein (OCUVITE-LUTEIN) CAPS capsule Take 1 capsule by mouth 2 (two) times a day.      nitroGLYCERIN (NITROSTAT) 0.4 MG SL tablet Place 1 tablet (0.4 mg total) under the tongue every 5 (five) minutes as needed. (Patient taking differently: Place 0.4 mg under the tongue every 5 (five) minutes as needed for chest pain.) 25 tablet 3   Omega-3 Fatty Acids (FISH OIL) 1200 MG CAPS Take 1,200 mg by mouth at bedtime.     pantoprazole (PROTONIX) 40 MG tablet Take 1 tablet (40 mg  total) by mouth daily. 90 tablet 3   rosuvastatin (CRESTOR) 40 MG tablet Take 1 tablet by mouth once daily (Patient taking differently: Take 40 mg by mouth daily.) 90 tablet 3   venlafaxine XR (EFFEXOR-XR) 150 MG 24 hr capsule Take 1 capsule (150 mg total) by mouth daily with breakfast. 90 capsule 3   vitamin B-12 (CYANOCOBALAMIN) 1000 MCG tablet Take 2,000 mcg by mouth daily.      No current facility-administered medications for this visit.     Review of Systems  Please see the history of present illness.   All other systems reviewed and are otherwise negative except as noted above.  Physical Exam    Wt Readings from Last 3 Encounters:  07/15/22 130 lb (59 kg)  11/20/21 131 lb 6.4 oz (59.6 kg)  10/06/21 130 lb 6.4 oz (59.1 kg)   ZO:XWRUEVS:There were no vitals filed for this visit.,There is no height or weight on file to calculate BMI.  Constitutional:      Appearance: Healthy appearance. Not in distress.  Neck:     Vascular: JVD normal.  Pulmonary:     Effort: Pulmonary effort is normal.     Breath sounds: No wheezing. No rales. Diminished in the bases Cardiovascular:     Normal rate. Regular rhythm. Normal S1. Normal S2.      Murmurs: There is no murmur.  Edema:    Peripheral edema absent.  Abdominal:     Palpations: Abdomen is soft non tender. There is no hepatomegaly.  Skin:    General: Skin is warm and dry.  Neurological:     General: No focal deficit present.     Mental Status: Alert and oriented to person, place and time.     Cranial Nerves: Cranial nerves are intact.  EKG/LABS/ Recent Cardiac Studies    ECG personally reviewed by me today -none completed today     Lab Results  Component Value Date   WBC 5.8 07/16/2022   HGB 12.6 07/16/2022   HCT 37.1 07/16/2022   MCV 95.9 07/16/2022   PLT 207 07/16/2022   Lab Results  Component Value Date   CREATININE 0.80 07/16/2022   BUN 13 07/16/2022   NA 139 07/16/2022   K 3.7 07/16/2022   CL 113 (H) 07/16/2022   CO2  19 (L) 07/16/2022   Lab Results  Component Value Date   ALT 15 07/16/2022   AST 22 07/16/2022   ALKPHOS 50 07/16/2022   BILITOT 0.6 07/16/2022   Lab Results  Component Value Date   CHOL 109 03/21/2020   HDL 57 03/21/2020   LDLCALC 44 03/21/2020   TRIG 38 03/21/2020   CHOLHDL 1.9 03/21/2020    Lab Results  Component Value Date   HGBA1C 6.0 (H) 07/14/2011    Cardiac Studies & Procedures   CARDIAC CATHETERIZATION  CARDIAC CATHETERIZATION 03/21/2020  Narrative Images from the original result were not included.   Previously placed Prox RCA to Mid RCA stent (unknown type) is widely patent.  Previously placed Dist RCA stent (unknown type) is widely patent.  Mid LAD lesion is 70% stenosed.  Susan [name] is a 89 y.o. female   [id number] LOCATION:  FACILITY: [facility] PHYSICIAN: Nanetta BattyJonathan Berry, M.D. 07/24/1933   DATE OF PROCEDURE:  03/21/2020  DATE OF DISCHARGE:     CARDIAC CATHETERIZATION    History obtained from chart review.Susan [name] is a 89 y.o. female with a hx of CAD hx stent to RCA  2005, also mod prox LAD stenosis, hypothyroid, HLD, decreased EF 40-45 %, LBBB,  who was seen in the emergency room today by definition for chest pain.  She has a history of remote RCA stenting.  Cath performed by Dr. Okey DupreEnd 10/08/2017 revealed patent mid and distal RCA stents and 60 to 70% segmental mid LAD disease.  Medical therapy was recommended.  She received her Covid booster on Monday and developed chest pain on Wednesday which was pleuritic and presented to the emergency room and was long hospital.  Her EKG showed left bundle branch block which was old.  Her enzymes were low and flat.  Because of her chest pain and known CAD [facility].  Impression Susan [name] has patent stents in her mid and distal RCA.  Her  mid LAD disease is unchanged since her last cath in June 2019.  Her anatomy is for all intents and  purposes unchanged.  Given the pleuritic nature of her symptoms and the fact that her enzymes are negative I favor conservative/medical therapy.  The sheath was removed and a TR band was placed on the right wrist to achieve patent hemostasis.  The patient left the lab in stable condition.  Dr. Eden Emms, her attending cardiologist, is aware of her catheterization results.  Nanetta Batty. MD, [facility] 03/21/2020 4:52 PM  Findings Coronary Findings Diagnostic  Dominance: Right  Left Anterior Descending Mid LAD lesion is 70% stenosed. The lesion is calcified.  Right Coronary Artery Previously placed Prox RCA to Mid RCA stent (unknown type) is widely patent. Previously placed Dist RCA stent (unknown type) is widely patent.  Intervention  No interventions have been documented.   CARDIAC CATHETERIZATION  CARDIAC CATHETERIZATION 10/08/2017  Narrative Conclusions: 1. Moderate to severe mid LAD stenosis (60-70%), slightly worsen than prior catheterization in 2012. 2. Mild, non-obstructive LCx and RCA disease.  Mid and distal RCA stents are widely patent. 3. Severely reduced LVEF with global hypokinesis. 4. Normal left heart, right heart, and pulmonary artery pressures.  Recommendations: 1. Medical therapy.  Will add carvedilol 3.125 mg BID with plans for further optimization of evidence based heart failure therapy as an outpatient. 2. Aggressive secondary prevention of coronary artery disease. 3. Medical management of LAD disease, which is only slightly worse than in 2012.  Degree of cardiomyopathy is out of proportion to coronary artery disease and suggests underlying non-ischemic cardiomyopathy.  Yvonne Kendall, MD [facility] Pager: [phone]  Findings Coronary Findings Diagnostic  Dominance: Right  Left Main Vessel is moderate in size. Vessel is angiographically normal.  Left Anterior Descending Vessel is moderate in size. The vessel is moderately calcified. The vessel  is moderately tortuous. Mid LAD-1 lesion is 50% stenosed. The lesion is eccentric. The lesion is moderately calcified. Mid LAD-2 lesion is 70% stenosed.  First Diagonal Branch Vessel is small in size.  Second Diagonal Branch Vessel is small in size.  Third Diagonal Branch Vessel is small in size.  Left Circumflex Vessel is large. Prox Cx lesion is 20% stenosed. The lesion is eccentric.  First Obtuse Marginal Branch Vessel is small in size.  Second Obtuse Marginal Branch Vessel is small in size.  Third Obtuse Marginal Branch Vessel is large in size.  Right Coronary Artery Vessel is moderate in size. Prox RCA lesion is 40% stenosed. Previously placed Mid RCA stent (unknown type) is widely patent. Previously placed Dist RCA stent (unknown type) is widely patent.  Right Posterior Descending Artery Vessel is moderate in size.  Right Posterior Atrioventricular Artery Vessel is moderate in size.  Intervention  No interventions have been documented.     ECHOCARDIOGRAM  ECHOCARDIOGRAM COMPLETE 10/27/2021  Narrative ECHOCARDIOGRAM REPORT    Patient Name:   Susan [name] Date of Exam: 10/27/2021 Medical Rec #:  [id number]        Height:       63.0 in Accession #:    [id number]       Weight:       130.4 lb Date of Birth:  07/29/1933         BSA:          1.612 m Patient Age:    87 years         BP:           108/70 mmHg  Patient Gender: F                HR:           63 bpm. Exam Location:  [address]  Procedure: 2D Echo, Cardiac Doppler and Color Doppler  Indications:    I50.22 Chronic systolic (congestive) heart failure  History:        Patient has prior history of Echocardiogram examinations, most recent 03/21/2020. Cardiomyopathy, CAD, Signs/Symptoms:Chest Pain and Syncope; Risk Factors:Dyslipidemia. Dyspnea on exertion. Hypothyroidism.  Sonographer:    Cathie BeamsAngela Gregory RCS Referring Phys: [phone] PHILIP J NAHSER  IMPRESSIONS   1. Left ventricular  ejection fraction, by estimation, is 30 to 35%. The left ventricle has moderately decreased function. The left ventricle demonstrates global hypokinesis. There is mild left ventricular hypertrophy. Left ventricular diastolic parameters are consistent with Grade I diastolic dysfunction (impaired relaxation). 2. Right ventricular systolic function was not well visualized. The right ventricular size is normal. 3. Left atrial size was mildly dilated. 4. Moderate mitral valve regurgitation. 5. The tricuspid valve is myxomatous. Tricuspid valve regurgitation is mild to moderate. 6. The aortic valve is tricuspid. Aortic valve regurgitation is mild to moderate. Aortic valve sclerosis is present, with no evidence of aortic valve stenosis. 7. Pulmonic valve regurgitation is moderate. 8. The inferior vena cava is normal in size with greater than 50% respiratory variability, suggesting right atrial pressure of 3 mmHg.  FINDINGS Left Ventricle: Left ventricular ejection fraction, by estimation, is 30 to 35%. The left ventricle has moderately decreased function. The left ventricle demonstrates global hypokinesis. The left ventricular internal cavity size was normal in size. There is mild left ventricular hypertrophy. Left ventricular diastolic parameters are consistent with Grade I diastolic dysfunction (impaired relaxation).  Right Ventricle: The right ventricular size is normal. No increase in right ventricular wall thickness. Right ventricular systolic function was not well visualized.  Left Atrium: Left atrial size was mildly dilated.  Right Atrium: Right atrial size was normal in size.  Pericardium: There is no evidence of pericardial effusion.  Mitral Valve: There is moderate thickening of the mitral valve leaflet(s). Mild to moderate mitral annular calcification. Moderate mitral valve regurgitation.  Tricuspid Valve: The tricuspid valve is myxomatous. Tricuspid valve regurgitation is mild to  moderate.  Aortic Valve: The aortic valve is tricuspid. Aortic valve regurgitation is mild to moderate. Aortic regurgitation PHT measures 517 msec. Aortic valve sclerosis is present, with no evidence of aortic valve stenosis.  Pulmonic Valve: The pulmonic valve was normal in structure. Pulmonic valve regurgitation is moderate.  Aorta: The aortic root and ascending aorta are structurally normal, with no evidence of dilitation.  Venous: The inferior vena cava is normal in size with greater than 50% respiratory variability, suggesting right atrial pressure of 3 mmHg.  IAS/Shunts: No atrial level shunt detected by color flow Doppler.   LEFT VENTRICLE PLAX 2D LVIDd:         5.00 cm Diastology LVIDs:         4.20 cm LV e' medial:    3.50 cm/s LV PW:         1.20 cm LV E/e' medial:  19.5 LV IVS:        1.20 cm LV e' lateral:   7.21 cm/s LV E/e' lateral: 9.4   RIGHT VENTRICLE RV Basal diam:  2.90 cm RV S prime:     6.66 cm/s TAPSE (M-mode): 1.9 cm RVSP:           34.8 mmHg  LEFT  ATRIUM             Index        RIGHT ATRIUM           Index LA diam:        3.10 cm 1.92 cm/m   RA Pressure: 3.00 mmHg LA Vol (A2C):   63.4 ml 39.32 ml/m  RA Area:     12.20 cm LA Vol (A4C):   59.1 ml 36.66 ml/m  RA Volume:   32.90 ml  20.41 ml/m LA Biplane Vol: 65.6 ml 40.69 ml/m AORTIC VALVE LVOT Vmax:   83.00 cm/s LVOT Vmean:  54.100 cm/s LVOT VTI:    0.180 m AI PHT:      517 msec  AORTA Ao Asc diam: 3.40 cm  MITRAL VALVE                  TRICUSPID VALVE MV Area (PHT): 2.54 cm       TR Peak grad:   31.8 mmHg MV Decel Time: 299 msec       TR Vmax:        282.00 cm/s MR Peak grad:    93.7 mmHg    Estimated RAP:  3.00 mmHg MR Mean grad:    54.0 mmHg    RVSP:           34.8 mmHg MR Vmax:         484.00 cm/s MR Vmean:        343.0 cm/s   SHUNTS MR PISA:         2.26 cm     Systemic VTI: 0.18 m MR PISA Eff ROA: 18 mm MR PISA Radius:  0.60 cm MV E velocity: 68.10 cm/s MV A velocity: 103.00  cm/s MV E/A ratio:  0.66  Dietrich Pates MD Electronically signed by Dietrich Pates MD Signature Date/Time: 10/27/2021/7:25:20 PM    Final             Assessment & Plan    1.  Presyncope: -Patient recently seen in the ED after experiencing presyncope while at church.  CT of the head was negative and neuroexam was fully intact.  Patient may have had possible vasovagal event due to sweatiness and lightheadedness. -Today patient reports no recurrence of presyncope or dizziness since ED visit. -We will order a 7-day ZIO monitor for further evaluation and to rule out any possible arrhythmias -She was advised to increase fluid intake and stay hydrated especially if spending time outdoors  2.  HFrEF: -Most recent 2D echo 10/2021 showing EF reduced at 30-35% with moderate LV dysfunction, global hypokinesis and grade 1 DD.  She was referred to EP for consideration of CRT however deemed not a candidate due to nonischemic cardiomyopathy. -Today patient is euvolemic on examination and has no complaints of shortness of breath. -Continue current GDMT with losartan 25 mg -We can consider adding SGLT2 or ARB in the future if blood pressure allows.  3.  History of CAD: -s/p RCA DES with moderate disease of proximal LAD (2005).  Most recent ischemic evaluation completed in 2021 with stable anatomy and no changes noted. -Patient currently not on beta-blockers due to LBBB -Today patient reports chest pain or anginal equivalent -Continue GDMT with Plavix 75 mg, Crestor 40 mg, fish oil 1200 mg  4.  History of carotid artery disease: -Patient's last carotid ultrasound completed in 2019 showing 1-39% bilaterally -We will obtain carotid Dopplers for evaluation and to rule out possible occlusion related to  presyncope episode  Disposition: Follow-up with Kristeen Miss, MD or APP in 4 months    Medication Adjustments/Labs and Tests Ordered: Current medicines are reviewed at length with the patient today.   Concerns regarding medicines are outlined above.   Signed, Napoleon Form, Leodis Rains, NP 07/26/2022, 8:57 AM [facility]  Note:  This document was prepared using Dragon voice recognition software and may include unintentional dictation errors.

## 2022-07-27 ENCOUNTER — Other Ambulatory Visit: Payer: Self-pay | Admitting: Nurse Practitioner

## 2022-07-27 ENCOUNTER — Encounter: Payer: Self-pay | Admitting: Nurse Practitioner

## 2022-07-27 ENCOUNTER — Ambulatory Visit: Payer: Medicare Other | Attending: Nurse Practitioner | Admitting: Nurse Practitioner

## 2022-07-27 ENCOUNTER — Ambulatory Visit (INDEPENDENT_AMBULATORY_CARE_PROVIDER_SITE_OTHER): Payer: Medicare Other

## 2022-07-27 VITALS — BP 98/62 | HR 69 | Ht 63.0 in | Wt 134.8 lb

## 2022-07-27 DIAGNOSIS — I5022 Chronic systolic (congestive) heart failure: Secondary | ICD-10-CM

## 2022-07-27 DIAGNOSIS — R55 Syncope and collapse: Secondary | ICD-10-CM

## 2022-07-27 DIAGNOSIS — I429 Cardiomyopathy, unspecified: Secondary | ICD-10-CM | POA: Diagnosis not present

## 2022-07-27 DIAGNOSIS — I2510 Atherosclerotic heart disease of native coronary artery without angina pectoris: Secondary | ICD-10-CM

## 2022-07-27 DIAGNOSIS — I493 Ventricular premature depolarization: Secondary | ICD-10-CM

## 2022-07-27 NOTE — Progress Notes (Unsigned)
ZIO XT serial # [id number] from office inventory applied in office.   Dr. Acie Fredrickson to read.

## 2022-07-27 NOTE — Patient Instructions (Addendum)
Medication Instructions:  Your physician recommends that you continue on your current medications as directed. Please refer to the Current Medication list given to you today. *If you need a refill on your cardiac medications before your next appointment, please call your pharmacy*   Lab Work: None ordered   Testing/Procedures: Your physician has requested that you have a carotid duplex. This test is an ultrasound of the carotid arteries in your neck. It looks at blood flow through these arteries that supply the brain with blood. Allow one hour for this exam. There are no restrictions or special instructions.  ZIO XT- Long Term Monitor Instructions  Your physician has requested you wear a ZIO patch monitor for 14 days.  This is a single patch monitor. [employer] supplies one patch monitor per enrollment. Additional stickers are not available. Please do not apply patch if you will be having a Nuclear Stress Test,  Echocardiogram, Cardiac CT, MRI, or Chest Xray during the period you would be wearing the  monitor. The patch cannot be worn during these tests. You cannot remove and re-apply the  ZIO XT patch monitor.  Your ZIO patch monitor will be mailed 3 day USPS to your address on file. It may take 3-5 days  to receive your monitor after you have been enrolled.  Once you have received your monitor, please review the enclosed instructions. Your monitor  has already been registered assigning a specific monitor serial # to you.  Billing and Patient Assistance Program Information  We have supplied [employer] with any of your insurance information on file for billing purposes. [employer] offers a sliding scale Patient Assistance Program for patients that do not have  insurance, or whose insurance does not completely cover the cost of the ZIO monitor.  You must apply for the Patient Assistance Program to qualify for this discounted rate.  To apply, please call [employer] at [phone], select option 4,  select option 2, ask to apply for  Patient Assistance Program. Theodore Demark will ask your household income, and how many people  are in your household. They will quote your out-of-pocket cost based on that information.  [employer] will also be able to set up a 80-month, interest-free payment plan if needed.  Applying the monitor   Shave hair from upper left chest.  Hold abrader disc by orange tab. Rub abrader in 40 strokes over the upper left chest as  indicated in your monitor instructions.  Clean area with 4 enclosed alcohol pads. Let dry.  Apply patch as indicated in monitor instructions. Patch will be placed under collarbone on left  side of chest with arrow pointing upward.  Rub patch adhesive wings for 2 minutes. Remove white label marked "1". Remove the white  label marked "2". Rub patch adhesive wings for 2 additional minutes.  While looking in a mirror, press and release button in center of patch. A small green light will  flash 3-4 times. This will be your only indicator that the monitor has been turned on.  Do not shower for the first 24 hours. You may shower after the first 24 hours.  Press the button if you feel a symptom. You will hear a small click. Record Date, Time and  Symptom in the Patient Logbook.  When you are ready to remove the patch, follow instructions on the last 2 pages of Patient  Logbook. Stick patch monitor onto the last page of Patient Logbook.  Place Patient Logbook in the blue and white box. Use locking tab  on box and tape box closed  securely. The blue and white box has prepaid postage on it. Please place it in the mailbox as  soon as possible. Your physician should have your test results approximately 7 days after the  monitor has been mailed back to [facility].  Call [place] at [phone] if you have questions regarding  your ZIO XT patch monitor. Call them immediately if you see an orange light blinking on your  monitor.  If your  monitor falls off in less than 4 days, contact our Monitor department at [phone].  If your monitor becomes loose or falls off after 4 days call [employer] at [phone] for  suggestions on securing your monitor    Follow-Up: At [facility], you and your health needs are our priority.  As part of our continuing mission to provide you with exceptional heart care, we have created designated Provider Care Teams.  These Care Teams include your primary Cardiologist (physician) and Advanced Practice Providers (APPs -  Physician Assistants and Nurse Practitioners) who all work together to provide you with the care you need, when you need it.  We recommend signing up for the patient portal called "MyChart".  Sign up information is provided on this After Visit Summary.  MyChart is used to connect with patients for Virtual Visits (Telemedicine).  Patients are able to view lab/test results, encounter notes, upcoming appointments, etc.  Non-urgent messages can be sent to your provider as well.   To learn more about what you can do with MyChart, go to [email or web address].    Your next appointment:   4 month(s)  Provider:   Mertie Moores, MD     Other Instructions

## 2022-07-30 ENCOUNTER — Ambulatory Visit (HOSPITAL_COMMUNITY)
Admission: RE | Admit: 2022-07-30 | Discharge: 2022-07-30 | Disposition: A | Discharge status: Home / Self Care | Payer: Medicare Other | Source: Ambulatory Visit | Attending: Cardiology | Admitting: Cardiology

## 2022-07-30 DIAGNOSIS — I2510 Atherosclerotic heart disease of native coronary artery without angina pectoris: Secondary | ICD-10-CM | POA: Insufficient documentation

## 2022-07-30 DIAGNOSIS — I429 Cardiomyopathy, unspecified: Secondary | ICD-10-CM | POA: Diagnosis not present

## 2022-07-30 DIAGNOSIS — R55 Syncope and collapse: Secondary | ICD-10-CM | POA: Insufficient documentation

## 2022-07-30 DIAGNOSIS — I5022 Chronic systolic (congestive) heart failure: Secondary | ICD-10-CM | POA: Diagnosis not present

## 2022-08-10 DIAGNOSIS — I429 Cardiomyopathy, unspecified: Secondary | ICD-10-CM | POA: Diagnosis not present

## 2022-08-10 DIAGNOSIS — R55 Syncope and collapse: Secondary | ICD-10-CM | POA: Diagnosis not present

## 2022-08-26 DIAGNOSIS — K080 Exfoliation of teeth due to systemic causes: Secondary | ICD-10-CM | POA: Diagnosis not present

## 2022-10-01 DIAGNOSIS — E559 Vitamin D deficiency, unspecified: Secondary | ICD-10-CM | POA: Diagnosis not present

## 2022-10-01 DIAGNOSIS — E538 Deficiency of other specified B group vitamins: Secondary | ICD-10-CM | POA: Diagnosis not present

## 2022-10-01 DIAGNOSIS — E039 Hypothyroidism, unspecified: Secondary | ICD-10-CM | POA: Diagnosis not present

## 2022-10-01 DIAGNOSIS — M810 Age-related osteoporosis without current pathological fracture: Secondary | ICD-10-CM | POA: Diagnosis not present

## 2022-10-01 DIAGNOSIS — K219 Gastro-esophageal reflux disease without esophagitis: Secondary | ICD-10-CM | POA: Diagnosis not present

## 2022-10-01 DIAGNOSIS — E785 Hyperlipidemia, unspecified: Secondary | ICD-10-CM | POA: Diagnosis not present

## 2022-10-07 ENCOUNTER — Other Ambulatory Visit: Payer: Self-pay | Admitting: Internal Medicine

## 2022-10-07 DIAGNOSIS — I2510 Atherosclerotic heart disease of native coronary artery without angina pectoris: Secondary | ICD-10-CM | POA: Diagnosis not present

## 2022-10-07 DIAGNOSIS — E039 Hypothyroidism, unspecified: Secondary | ICD-10-CM | POA: Diagnosis not present

## 2022-10-07 DIAGNOSIS — Z1389 Encounter for screening for other disorder: Secondary | ICD-10-CM | POA: Diagnosis not present

## 2022-10-07 DIAGNOSIS — M810 Age-related osteoporosis without current pathological fracture: Secondary | ICD-10-CM

## 2022-10-07 DIAGNOSIS — Z1331 Encounter for screening for depression: Secondary | ICD-10-CM | POA: Diagnosis not present

## 2022-10-07 DIAGNOSIS — R82998 Other abnormal findings in urine: Secondary | ICD-10-CM | POA: Diagnosis not present

## 2022-10-07 DIAGNOSIS — Z0000 Encounter for general adult medical examination without abnormal findings: Secondary | ICD-10-CM | POA: Diagnosis not present

## 2022-10-08 DIAGNOSIS — Z955 Presence of coronary angioplasty implant and graft: Secondary | ICD-10-CM | POA: Diagnosis not present

## 2022-10-08 DIAGNOSIS — D6869 Other thrombophilia: Secondary | ICD-10-CM | POA: Diagnosis not present

## 2022-11-28 ENCOUNTER — Encounter: Payer: Self-pay | Admitting: Cardiovascular Disease

## 2022-11-28 NOTE — Progress Notes (Unsigned)
Cardiology Office Note   Date:  11/30/2022   ID:  Susan [name], Susan [name] 08/29/1933, MRN [id number]  PCP:  Rodrigo Ran, MD  Cardiologist:   Kristeen Miss, MD   Chief Complaint  Patient presents with   Coronary Artery Disease        Congestive Heart Failure        1. Coronary artery disease-status post PTCA and stenting of her right coronary artery.( 2005)  We placed a 2.75 x 16 mm Taxus stent deployed at 18 atmospheres. She also has a moderate proximal LAD stenosis. 2. Hypothyroidism 3. Hyperlipidemia-   Previous Notes:     Susan [name] is a 89 yo with a hx of CAD.  She is still playing tennis regularly.  She has not had any angina.  She has some DOE.     October 26, 2012:  Susan [name] is doing well.  She is still playing lots of tennis.  No CP.  She has had a lifeline screening - her carotid arteries are normal.  She denies episodes of chest pain or shortness of breath.  October 11, 2013:  Susan [name] is not feeling well today. "Feels like she is jumping out of her skin" Got back from a trip , she found out that a friend had died.   01/16/2014:  Susan [name] is doing ok.  She has had some eye problems.  Her eyesite is ok.    No angina.   She does have some mild dyspnea if she climbs 3-4 flights of stairs.  Still playing tennis regularly.    July 18, 2014:  Susan [name] is a 89 y.o. female who presents for follow up for her CAD. Just got back from a mission trip in [place]. Did lots of painting and cleaning. No angina or dyspnea.   Is not playing as much tennis - her tennis team has moved on.  2 of her partners have died , 1 is too unstable on her feet.   July 25, 2015:  Doing well.  Very active in her garden .  Plays tennis regularly . Plays on soft courts because of her back and knee pain .  July 28, 2016:  Susan [name] is seen today  Recent labs at [facility] reveals Chol = 155 Trigs = 113 HDL = 59 LDL = 73  She is upset today .   Her grandson OD'd on  cough syrup He is doing to a rehab place today   Saw Nada Boozer, NP on March 5 for palpitations.  Walking some.   Looking forward to getting back into tennis . No CP,  Palpitations have resolved.   Nov. 8, 2018  Doing well Still playing tennis  Plays on soft courts  No CP or dyspnea  Has occasional episodes of near syncope .  Most of the time when she is sitting down.   Last for a second or so.   Feels fine right after the episode  Has had 2 episodes over the past 6 months .   Did not pass out  Sep 22, 2017:  Doing well.   Not playing as much tennis now - most of her tennis friends have died. .    Has occasional DOE while playing tennis for a long point or walking up a hill.  No real chest pain   October 28, 2017:  Susan [name] is seen back today for follow-up visit.  She has a history of coronary artery disease and coronary stenting.  She was having some episodes of chest pain when I last saw her in May.  Repeat heart catheterization revealed a moderate to severe mid LAD stenosis of 60 to 70%.  This is slightly worse than it was in her heart catheterization in 2012.  She was found to have severely reduced left ventricular systolic function.  She was started on carvedilol 3.125 mg twice a day.  Feb. 11, 2021:  Susan [name] is seen today for a follow-up visit.  She has a history of chronic systolic congestive heart failure.  Her ejection fraction was 30 to 35%.  Echocardiogram performed in July, 2020 revealed a slightly improved LV function of 40 to 45%.  She does have some dyssynergy due to her left bundle branch block.  She is admitted to [facility] in July, 2020 with near syncope and symptoms consistent with orthostatic hypotension.  She had had little to eat that day and was rushing around all day.  She fell and fractured her clavicle.  She has had a few other episodes of orthostasis .  She has warning of a few seconds.  She cut her Coreg to 3. 125 a day due to orthostasis .  She  cannot tel if her symptoms are better but she hasnt fallen.     October 04, 2020: Susan [name] is seen today for follow up of her CAD  has not been playing tennis following a fall several years ago   Seems to be doing well .    Still falls on rare occasion .   Is walking on occasion  Works in her yard.  Does not need a walker or cane at this point.   Is taking her carvedilol only 1 time a day .   October 06, 2021:  Susan [name] is seen today for follow-up visit.  She has a history of coronary artery disease.  She also has a left bundle branch block and has developed congestive heart failure.  Echo 8/19 - EF 30-35% Echo  7/20 - EF 40-455 Echo 11/21 - EF 35-40%, grade II DD   Doing well. Has been having some "different " chest pain . Did not take NTG.  Took an ASA Walks quite a bit.   [place] in [place] neighborhood.  Has some DOE in the hills.   Does not have CP with walking .  Is not playing tennis ( fell several years ago so has not been playing tennis any longer )   We discussed her low EF .  We will repeat her echocardiogram.  We did discuss the possibility that she may need to be referred to electrophysiology for CRT pacing.    November 30, 2022 Susan [name] is seen for follow up of her CAD, CHF, LBBB  Echo from June, 2023 shows EF 30-35%  She had an episode of pre-syncope while at church and was seen by Robin Searing, NP in March , 2024 Was seen by the church nurse and evaluated .  She did not fall,    7 day Zio monitor -  NSR with 37 episodes of nonsustained VT.   The fastest episode lasted for 10.1 sec at an average HR of 215.  We have been limited in increasing her CHF meds due to her Hypotension  Is walking regularly  No CP , Mild dyspnea when she climbs stairs   Is concerned that her BP is too low  We discussed being careful with standing up too quickly  We discussed the possibility  of  stopping the Losartan. We would definitely stop the Losartan if she has syncope      Past Medical History:  Diagnosis Date   Chronic HFrEF (heart failure with reduced ejection fraction) (HCC)    Coronary artery disease    PTCA/stenting of RCA 2005, moderate residual 70% stenosis of LAD stable by last cath 2021   Frequent PVCs    Hyperlipidemia    Hypothyroidism    LBBB (left bundle branch block)    Mild carotid artery disease (HCC)    Osteoporosis    Syncope     Past Surgical History:  Procedure Laterality Date   APPENDECTOMY  1970s?   "ruptured"    BACK SURGERY     CATARACT EXTRACTION W/ INTRAOCULAR LENS  IMPLANT, BILATERAL Bilateral    CORONARY ANGIOPLASTY WITH STENT PLACEMENT  ~ 2009   "2 stents"   FRACTURE SURGERY     IR VERTEBROPLASTY CERV/THOR BX INC UNI/BIL INC/INJECT/IMAGING  02/07/2018   KNEE ARTHROSCOPY Right     torn meniscus   LEFT HEART CATH AND CORONARY ANGIOGRAPHY N/A 03/21/2020   Procedure: LEFT HEART CATH AND CORONARY ANGIOGRAPHY;  Surgeon: Runell Gess, MD;  Location: [facility];  Service: Cardiovascular;  Laterality: N/A;   LUMBAR LAMINECTOMY  1978   OVARIAN CYST SURGERY  1970s?   "ruptured"    RIGHT/LEFT HEART CATH AND CORONARY ANGIOGRAPHY N/A 10/08/2017   Procedure: RIGHT/LEFT HEART CATH AND CORONARY ANGIOGRAPHY;  Surgeon: Yvonne Kendall, MD;  Location: [facility] CV [facility];  Service: Cardiovascular;  Laterality: N/A;   WRIST FRACTURE SURGERY Bilateral    "2 surgeries for 2 breaks on left side; 1 surgery for 1 break on right wrist"     Current Outpatient Medications  Medication Sig Dispense Refill   acetaminophen (TYLENOL) 325 MG tablet Take 2 tablets (650 mg total) by mouth every 6 (six) hours.     Calcium Carbonate-Vitamin D (CALCIUM + D PO) Take 2 tablets by mouth at bedtime.     cholecalciferol (VITAMIN D3) 25 MCG (1000 UT) tablet Take 1,000 Units by mouth daily after breakfast.     clopidogrel (PLAVIX) 75 MG tablet Take 1 tablet by mouth once daily 90 tablet 3   levothyroxine (SYNTHROID, LEVOTHROID) 75 MCG tablet  Take 75 mcg by mouth daily before breakfast.   0   losartan (COZAAR) 25 MG tablet Take 0.5 tablets (12.5 mg total) by mouth at bedtime. 45 tablet 3   multivitamin-lutein (OCUVITE-LUTEIN) CAPS capsule Take 1 capsule by mouth 2 (two) times a day.      nitroGLYCERIN (NITROSTAT) 0.4 MG SL tablet Place 1 tablet (0.4 mg total) under the tongue every 5 (five) minutes as needed. (Patient taking differently: Place 0.4 mg under the tongue every 5 (five) minutes as needed for chest pain.) 25 tablet 3   Omega-3 Fatty Acids (FISH OIL) 1200 MG CAPS Take 1,200 mg by mouth at bedtime.     rosuvastatin (CRESTOR) 40 MG tablet Take 1 tablet by mouth once daily (Patient taking differently: Take 40 mg by mouth daily.) 90 tablet 3   venlafaxine XR (EFFEXOR-XR) 150 MG 24 hr capsule Take 1 capsule (150 mg total) by mouth daily with breakfast. 90 capsule 3   vitamin B-12 (CYANOCOBALAMIN) 1000 MCG tablet Take 2,000 mcg by mouth daily.      No current facility-administered medications for this visit.    Allergies:   Patient has no known allergies.    Social History:  The patient  reports that she has  never smoked. She has never used smokeless tobacco. She reports current alcohol use of about 1.0 standard drink of alcohol per week. She reports that she does not use drugs.   Family History:  The patient's family history includes Alzheimer's disease in her mother; Aneurysm in her sister; Heart attack in her father.    ROS:    Noted in current history, otherwise review of systems is negative.  Physical Exam: Blood pressure 104/82, pulse 81, height 5\' 3"  (1.6 m), weight 134 lb 4.8 oz (60.9 kg), SpO2 97%.       GEN:  Well nourished, well developed in no acute distress HEENT: Normal NECK: No JVD; soft left bruit LYMPHATICS: No lymphadenopathy CARDIAC: RRR  , soft systolic murmur  RESPIRATORY:  Clear to auscultation without rales, wheezing or rhonchi  ABDOMEN: Soft, non-tender, non-distended MUSCULOSKELETAL:  No  edema; No deformity  SKIN: Warm and dry NEUROLOGIC:  Alert and oriented x 3    EKG:     EKG Interpretation Date/Time:  Monday November 30 2022 10:29:25 EDT Ventricular Rate:  81 PR Interval:  158 QRS Duration:  124 QT Interval:  412 QTC Calculation: 478 R Axis:   -41  Text Interpretation: Normal sinus rhythm Left axis deviation Left bundle branch block When compared with ECG of 16-Jul-2022 00:04, PREVIOUS ECG IS PRESENT no changes since previous ECG Confirmed by Kristeen Miss ([id number]) on 11/30/2022 10:57:00 AM      Recent Labs: 07/15/2022: B Natriuretic Peptide 203.9 07/16/2022: ALT 15; BUN 13; Creatinine, Ser 0.80; Hemoglobin 12.6; Magnesium 2.2; Platelets 207; Potassium 3.7; Sodium 139    Lipid Panel    Component Value Date/Time   CHOL 109 03/21/2020 0856   TRIG 38 03/21/2020 0856   HDL 57 03/21/2020 0856   CHOLHDL 1.9 03/21/2020 0856   VLDL 8 03/21/2020 0856   LDLCALC 44 03/21/2020 0856      Wt Readings from Last 3 Encounters:  11/30/22 134 lb 4.8 oz (60.9 kg)  07/27/22 134 lb 12.8 oz (61.1 kg)  07/15/22 130 lb (59 kg)      Other studies Reviewed: Additional studies/ records that were reviewed today include: . Review of the above records demonstrates:    ASSESSMENT AND PLAN:  1. Coronary artery disease-   No angina      2.  Chronic systolic CHF:   -  EF is 30-35% Only tolerates Losartan 12.5 mg a day  Even this low dose is causing some hypotension  Has seen EP , was not a candidate for CRT pacing .     3.  Carotid artery disease:    soft left carotid bruit    3. Hyperlipidemia-  Managed by Dr. Waynard Edwards      Current medicines are reviewed at length with the patient today.  The patient does not have concerns regarding medicines.  The following changes have been made:  no change  Labs/ tests ordered today include:   Orders Placed This Encounter  Procedures   EKG 12-Lead  - Carotid duplex scan      Signed, Kristeen Miss, MD  11/30/2022  10:57 AM    [facility] [address] [place], [place], [place]  [address] Phone: [phone]; Fax: [phone]

## 2022-11-30 ENCOUNTER — Other Ambulatory Visit (HOSPITAL_COMMUNITY): Payer: Self-pay | Admitting: *Deleted

## 2022-11-30 ENCOUNTER — Ambulatory Visit: Payer: Medicare Other | Attending: Cardiovascular Disease | Admitting: Cardiovascular Disease

## 2022-11-30 ENCOUNTER — Encounter: Payer: Self-pay | Admitting: Cardiovascular Disease

## 2022-11-30 VITALS — BP 104/82 | HR 81 | Ht 63.0 in | Wt 134.3 lb

## 2022-11-30 DIAGNOSIS — I2510 Atherosclerotic heart disease of native coronary artery without angina pectoris: Secondary | ICD-10-CM | POA: Diagnosis not present

## 2022-11-30 DIAGNOSIS — R55 Syncope and collapse: Secondary | ICD-10-CM | POA: Diagnosis not present

## 2022-11-30 NOTE — Patient Instructions (Signed)
Medication Instructions:  Your physician recommends that you continue on your current medications as directed. Please refer to the Current Medication list given to you today.  *If you need a refill on your cardiac medications before your next appointment, please call your pharmacy*   Lab Work: NONE If you have labs (blood work) drawn today and your tests are completely normal, you will receive your results only by: MyChart Message (if you have MyChart) OR A paper copy in the mail If you have any lab test that is abnormal or we need to change your treatment, we will call you to review the results.   Testing/Procedures: NONE   Follow-Up: At [facility], you and your health needs are our priority.  As part of our continuing mission to provide you with exceptional heart care, we have created designated Provider Care Teams.  These Care Teams include your primary Cardiologist (physician) and Advanced Practice Providers (APPs -  Physician Assistants and Nurse Practitioners) who all work together to provide you with the care you need, when you need it.  We recommend signing up for the patient portal called "MyChart".  Sign up information is provided on this After Visit Summary.  MyChart is used to connect with patients for Virtual Visits (Telemedicine).  Patients are able to view lab/test results, encounter notes, upcoming appointments, etc.  Non-urgent messages can be sent to your provider as well.   To learn more about what you can do with MyChart, go to [email or web address].    Your next appointment:   6 month(s)  Provider:   Robin Searing, NP

## 2022-12-02 ENCOUNTER — Encounter (HOSPITAL_COMMUNITY): Payer: Medicare Other

## 2022-12-02 ENCOUNTER — Encounter (HOSPITAL_COMMUNITY): Payer: Self-pay

## 2022-12-07 ENCOUNTER — Ambulatory Visit (HOSPITAL_COMMUNITY)
Admission: RE | Admit: 2022-12-07 | Discharge: 2022-12-07 | Disposition: A | Discharge status: Home / Self Care | Payer: Medicare Other | Source: Ambulatory Visit | Attending: Internal Medicine | Admitting: Internal Medicine

## 2022-12-07 DIAGNOSIS — M810 Age-related osteoporosis without current pathological fracture: Secondary | ICD-10-CM | POA: Insufficient documentation

## 2022-12-07 MED ORDER — DENOSUMAB 60 MG/ML ~~LOC~~ SOSY
PREFILLED_SYRINGE | SUBCUTANEOUS | Status: AC
  Administered 2022-12-07: 60 mg via SUBCUTANEOUS
  Filled 2022-12-07: qty 1

## 2022-12-07 MED ORDER — DENOSUMAB 60 MG/ML ~~LOC~~ SOSY: 60.0000 mg | PREFILLED_SYRINGE | Freq: Once | SUBCUTANEOUS | Status: AC

## 2023-01-14 DIAGNOSIS — K080 Exfoliation of teeth due to systemic causes: Secondary | ICD-10-CM | POA: Diagnosis not present

## 2023-01-18 ENCOUNTER — Other Ambulatory Visit: Payer: Self-pay | Admitting: Internal Medicine

## 2023-01-18 ENCOUNTER — Other Ambulatory Visit: Payer: Self-pay | Admitting: Cardiovascular Disease

## 2023-02-15 ENCOUNTER — Other Ambulatory Visit: Payer: Self-pay | Admitting: Cardiovascular Disease

## 2023-02-19 ENCOUNTER — Other Ambulatory Visit: Payer: Self-pay

## 2023-02-19 MED ORDER — VENLAFAXINE HCL ER 150 MG PO CP24: 150.0000 mg | ORAL_CAPSULE | Freq: Every day | ORAL | 2 refills | Status: AC

## 2023-03-02 ENCOUNTER — Other Ambulatory Visit: Payer: Self-pay | Admitting: Cardiovascular Disease

## 2023-03-02 DIAGNOSIS — Z23 Encounter for immunization: Secondary | ICD-10-CM | POA: Diagnosis not present

## 2023-03-03 DIAGNOSIS — K080 Exfoliation of teeth due to systemic causes: Secondary | ICD-10-CM | POA: Diagnosis not present

## 2023-03-05 DIAGNOSIS — M5416 Radiculopathy, lumbar region: Secondary | ICD-10-CM | POA: Diagnosis not present

## 2023-03-05 DIAGNOSIS — M79604 Pain in right leg: Secondary | ICD-10-CM | POA: Diagnosis not present

## 2023-03-06 ENCOUNTER — Other Ambulatory Visit: Payer: Self-pay

## 2023-03-06 ENCOUNTER — Emergency Department (HOSPITAL_COMMUNITY)
Admission: EM | Admit: 2023-03-06 | Discharge: 2023-03-06 | Disposition: A | Discharge status: Home / Self Care | Payer: Medicare Other | Attending: Emergency Medicine | Admitting: Emergency Medicine

## 2023-03-06 ENCOUNTER — Encounter (HOSPITAL_COMMUNITY): Payer: Self-pay

## 2023-03-06 ENCOUNTER — Emergency Department (HOSPITAL_COMMUNITY): Payer: Medicare Other

## 2023-03-06 DIAGNOSIS — I5022 Chronic systolic (congestive) heart failure: Secondary | ICD-10-CM | POA: Insufficient documentation

## 2023-03-06 DIAGNOSIS — Z7901 Long term (current) use of anticoagulants: Secondary | ICD-10-CM | POA: Insufficient documentation

## 2023-03-06 DIAGNOSIS — K769 Liver disease, unspecified: Secondary | ICD-10-CM | POA: Diagnosis not present

## 2023-03-06 DIAGNOSIS — E039 Hypothyroidism, unspecified: Secondary | ICD-10-CM | POA: Diagnosis not present

## 2023-03-06 DIAGNOSIS — R1110 Vomiting, unspecified: Secondary | ICD-10-CM | POA: Diagnosis not present

## 2023-03-06 DIAGNOSIS — M5431 Sciatica, right side: Secondary | ICD-10-CM

## 2023-03-06 DIAGNOSIS — M545 Low back pain, unspecified: Secondary | ICD-10-CM | POA: Insufficient documentation

## 2023-03-06 DIAGNOSIS — S3992XA Unspecified injury of lower back, initial encounter: Secondary | ICD-10-CM | POA: Diagnosis not present

## 2023-03-06 DIAGNOSIS — R9431 Abnormal electrocardiogram [ECG] [EKG]: Secondary | ICD-10-CM | POA: Diagnosis not present

## 2023-03-06 DIAGNOSIS — I517 Cardiomegaly: Secondary | ICD-10-CM | POA: Diagnosis not present

## 2023-03-06 DIAGNOSIS — I700 Atherosclerosis of aorta: Secondary | ICD-10-CM | POA: Diagnosis not present

## 2023-03-06 DIAGNOSIS — M47816 Spondylosis without myelopathy or radiculopathy, lumbar region: Secondary | ICD-10-CM | POA: Diagnosis not present

## 2023-03-06 DIAGNOSIS — M5441 Lumbago with sciatica, right side: Secondary | ICD-10-CM | POA: Diagnosis not present

## 2023-03-06 DIAGNOSIS — R0902 Hypoxemia: Secondary | ICD-10-CM | POA: Diagnosis not present

## 2023-03-06 DIAGNOSIS — I2510 Atherosclerotic heart disease of native coronary artery without angina pectoris: Secondary | ICD-10-CM | POA: Insufficient documentation

## 2023-03-06 DIAGNOSIS — I10 Essential (primary) hypertension: Secondary | ICD-10-CM | POA: Diagnosis not present

## 2023-03-06 DIAGNOSIS — M549 Dorsalgia, unspecified: Secondary | ICD-10-CM | POA: Diagnosis not present

## 2023-03-06 DIAGNOSIS — M48061 Spinal stenosis, lumbar region without neurogenic claudication: Secondary | ICD-10-CM | POA: Diagnosis not present

## 2023-03-06 LAB — URINALYSIS, W/ REFLEX TO CULTURE (INFECTION SUSPECTED)
Bacteria, UA: NONE SEEN
Bilirubin Urine: NEGATIVE
Glucose, UA: NEGATIVE mg/dL
Hgb urine dipstick: NEGATIVE
Ketones, ur: 5 mg/dL — AB
Leukocytes,Ua: NEGATIVE
Nitrite: NEGATIVE
Protein, ur: NEGATIVE mg/dL
Specific Gravity, Urine: 1.018 (ref 1.005–1.030)
pH: 7 (ref 5.0–8.0)

## 2023-03-06 LAB — CBC WITH DIFFERENTIAL/PLATELET
Abs Immature Granulocytes: 0.02 10*3/uL (ref 0.00–0.07)
Basophils Absolute: 0 10*3/uL (ref 0.0–0.1)
Basophils Relative: 1 %
Eosinophils Absolute: 0.1 10*3/uL (ref 0.0–0.5)
Eosinophils Relative: 1 %
HCT: 36.7 % (ref 36.0–46.0)
Hemoglobin: 11.9 g/dL — ABNORMAL LOW (ref 12.0–15.0)
Immature Granulocytes: 0 %
Lymphocytes Relative: 30 %
Lymphs Abs: 2.1 10*3/uL (ref 0.7–4.0)
MCH: 31.4 pg (ref 26.0–34.0)
MCHC: 32.4 g/dL (ref 30.0–36.0)
MCV: 96.8 fL (ref 80.0–100.0)
Monocytes Absolute: 0.6 10*3/uL (ref 0.1–1.0)
Monocytes Relative: 9 %
Neutro Abs: 4.1 10*3/uL (ref 1.7–7.7)
Neutrophils Relative %: 59 %
Platelets: 199 10*3/uL (ref 150–400)
RBC: 3.79 MIL/uL — ABNORMAL LOW (ref 3.87–5.11)
RDW: 14.6 % (ref 11.5–15.5)
WBC: 6.9 10*3/uL (ref 4.0–10.5)
nRBC: 0 % (ref 0.0–0.2)

## 2023-03-06 LAB — COMPREHENSIVE METABOLIC PANEL
ALT: 12 U/L (ref 0–44)
AST: 17 U/L (ref 15–41)
Albumin: 2.7 g/dL — ABNORMAL LOW (ref 3.5–5.0)
Alkaline Phosphatase: 37 U/L — ABNORMAL LOW (ref 38–126)
Anion gap: 7 (ref 5–15)
BUN: 22 mg/dL (ref 8–23)
CO2: 20 mmol/L — ABNORMAL LOW (ref 22–32)
Calcium: 7.5 mg/dL — ABNORMAL LOW (ref 8.9–10.3)
Chloride: 116 mmol/L — ABNORMAL HIGH (ref 98–111)
Creatinine, Ser: 0.83 mg/dL (ref 0.44–1.00)
GFR, Estimated: >60 mL/min (ref >60)
Glucose, Bld: 88 mg/dL (ref 70–99)
Potassium: 3.3 mmol/L — ABNORMAL LOW (ref 3.5–5.1)
Sodium: 143 mmol/L (ref 135–145)
Total Bilirubin: 0.5 mg/dL (ref 0.3–1.2)
Total Protein: 5.1 g/dL — ABNORMAL LOW (ref 6.5–8.1)

## 2023-03-06 MED ORDER — CYCLOBENZAPRINE HCL 10 MG PO TABS: 10.0000 mg | ORAL_TABLET | Freq: Every day | ORAL | 0 refills | Status: DC

## 2023-03-06 MED ORDER — ACETAMINOPHEN 500 MG PO TABS
1000.0000 mg | ORAL_TABLET | Freq: Once | ORAL | Status: AC
  Administered 2023-03-06: 1000 mg via ORAL
  Filled 2023-03-06: qty 2

## 2023-03-06 MED ORDER — FENTANYL CITRATE PF 50 MCG/ML IJ SOSY
25.0000 ug | PREFILLED_SYRINGE | Freq: Once | INTRAMUSCULAR | Status: AC
  Administered 2023-03-06: 25 ug via INTRAVENOUS
  Filled 2023-03-06: qty 1

## 2023-03-06 MED ORDER — IOHEXOL 350 MG/ML SOLN
75.0000 mL | Freq: Once | INTRAVENOUS | Status: AC | PRN
  Administered 2023-03-06: 75 mL via INTRAVENOUS

## 2023-03-06 MED ORDER — KETOROLAC TROMETHAMINE 15 MG/ML IJ SOLN
7.5000 mg | Freq: Once | INTRAMUSCULAR | Status: AC
  Administered 2023-03-06: 7.5 mg via INTRAVENOUS
  Filled 2023-03-06: qty 1
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
## 2023-03-06 NOTE — ED Notes (Signed)
The pt reports that she just wants to sleep

## 2023-03-06 NOTE — Discharge Instructions (Addendum)
While you are in the emergency department, you had CT scans done of your abdomen and back.  These test were normal.  You likely have a bulging disc in your back which is causing some irritation of one of your nerve roots.  This is causing the pain in your leg.  You can take 1000 mg of Tylenol every 8 hours, you can also take the Medrol Dosepak that you were prescribed.  Do not take ibuprofen or Advil with the steroids.  At bedtime, you can take Flexeril.  This is a muscle relaxer and will also help you sleep.  Follow-up with your primary care doctor.

## 2023-03-06 NOTE — ED Notes (Signed)
The pt wants something to make her sleep

## 2023-05-17 DIAGNOSIS — Z1231 Encounter for screening mammogram for malignant neoplasm of breast: Secondary | ICD-10-CM | POA: Diagnosis not present

## 2023-05-25 DIAGNOSIS — H353131 Nonexudative age-related macular degeneration, bilateral, early dry stage: Secondary | ICD-10-CM | POA: Diagnosis not present

## 2023-05-25 DIAGNOSIS — H43813 Vitreous degeneration, bilateral: Secondary | ICD-10-CM | POA: Diagnosis not present

## 2023-05-25 DIAGNOSIS — Z961 Presence of intraocular lens: Secondary | ICD-10-CM | POA: Diagnosis not present

## 2023-05-28 DIAGNOSIS — M48061 Spinal stenosis, lumbar region without neurogenic claudication: Secondary | ICD-10-CM | POA: Diagnosis not present
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
## 2023-05-31 ENCOUNTER — Encounter: Payer: Self-pay | Admitting: Nurse Practitioner

## 2023-05-31 ENCOUNTER — Ambulatory Visit: Payer: Medicare Other | Attending: Nurse Practitioner | Admitting: Nurse Practitioner

## 2023-05-31 VITALS — BP 102/62 | HR 80 | Ht 63.0 in | Wt 135.2 lb

## 2023-05-31 DIAGNOSIS — I2510 Atherosclerotic heart disease of native coronary artery without angina pectoris: Secondary | ICD-10-CM

## 2023-05-31 DIAGNOSIS — M5440 Lumbago with sciatica, unspecified side: Secondary | ICD-10-CM

## 2023-05-31 DIAGNOSIS — G8929 Other chronic pain: Secondary | ICD-10-CM

## 2023-05-31 DIAGNOSIS — R55 Syncope and collapse: Secondary | ICD-10-CM | POA: Diagnosis not present

## 2023-05-31 DIAGNOSIS — I429 Cardiomyopathy, unspecified: Secondary | ICD-10-CM

## 2023-05-31 DIAGNOSIS — I5022 Chronic systolic (congestive) heart failure: Secondary | ICD-10-CM | POA: Diagnosis not present

## 2023-05-31 NOTE — Patient Instructions (Signed)
Medication Instructions:   Your physician recommends that you continue on your current medications as directed. Please refer to the Current Medication list given to you today.   *If you need a refill on your cardiac medications before your next appointment, please call your pharmacy*   Lab Work: NONE ORDERED  TODAY    If you have labs (blood work) drawn today and your tests are completely normal, you will receive your results only by: MyChart Message (if you have MyChart) OR A paper copy in the mail If you have any lab test that is abnormal or we need to change your treatment, we will call you to review the results.   Testing/Procedures: NONE ORDERED  TODAY      Follow-Up: At [facility], you and your health needs are our priority.  As part of our continuing mission to provide you with exceptional heart care, we have created designated Provider Care Teams.  These Care Teams include your primary Cardiologist (physician) and Advanced Practice Providers (APPs -  Physician Assistants and Nurse Practitioners) who all work together to provide you with the care you need, when you need it.  We recommend signing up for the patient portal called "MyChart".  Sign up information is provided on this After Visit Summary.  MyChart is used to connect with patients for Virtual Visits (Telemedicine).  Patients are able to view lab/test results, encounter notes, upcoming appointments, etc.  Non-urgent messages can be sent to your provider as well.   To learn more about what you can do with MyChart, go to [email or web address].    Your next appointment:    6 month(s)  Provider:    Kristeen Miss, MD  /APP    Other Instructions

## 2023-06-03 DIAGNOSIS — E039 Hypothyroidism, unspecified: Secondary | ICD-10-CM | POA: Diagnosis not present

## 2023-06-03 DIAGNOSIS — M5416 Radiculopathy, lumbar region: Secondary | ICD-10-CM | POA: Diagnosis not present

## 2023-06-04 DIAGNOSIS — E039 Hypothyroidism, unspecified: Secondary | ICD-10-CM | POA: Diagnosis not present

## 2023-06-09 ENCOUNTER — Other Ambulatory Visit (HOSPITAL_COMMUNITY): Payer: Self-pay | Admitting: *Deleted

## 2023-06-10 ENCOUNTER — Ambulatory Visit (HOSPITAL_COMMUNITY)
Admission: RE | Admit: 2023-06-10 | Discharge: 2023-06-10 | Disposition: A | Discharge status: Home / Self Care | Payer: Medicare Other | Source: Ambulatory Visit | Attending: Internal Medicine | Admitting: Internal Medicine

## 2023-06-10 DIAGNOSIS — M810 Age-related osteoporosis without current pathological fracture: Secondary | ICD-10-CM | POA: Insufficient documentation

## 2023-06-10 MED ORDER — DENOSUMAB 60 MG/ML ~~LOC~~ SOSY
60.0000 mg | PREFILLED_SYRINGE | Freq: Once | SUBCUTANEOUS | Status: AC
  Administered 2023-06-10: 60 mg via SUBCUTANEOUS

## 2023-06-10 MED ORDER — DENOSUMAB 60 MG/ML ~~LOC~~ SOSY
PREFILLED_SYRINGE | SUBCUTANEOUS | Status: AC
  Filled 2023-06-10: qty 1

## 2023-06-18 DIAGNOSIS — L578 Other skin changes due to chronic exposure to nonionizing radiation: Secondary | ICD-10-CM | POA: Diagnosis not present

## 2023-06-18 DIAGNOSIS — L821 Other seborrheic keratosis: Secondary | ICD-10-CM | POA: Diagnosis not present

## 2023-06-18 DIAGNOSIS — Z85828 Personal history of other malignant neoplasm of skin: Secondary | ICD-10-CM | POA: Diagnosis not present

## 2023-06-18 DIAGNOSIS — L570 Actinic keratosis: Secondary | ICD-10-CM | POA: Diagnosis not present

## 2023-06-18 DIAGNOSIS — D485 Neoplasm of uncertain behavior of skin: Secondary | ICD-10-CM | POA: Diagnosis not present

## 2023-06-18 DIAGNOSIS — D489 Neoplasm of uncertain behavior, unspecified: Secondary | ICD-10-CM | POA: Diagnosis not present

## 2023-07-19 DIAGNOSIS — E785 Hyperlipidemia, unspecified: Secondary | ICD-10-CM | POA: Diagnosis not present

## 2023-07-19 DIAGNOSIS — R0789 Other chest pain: Secondary | ICD-10-CM | POA: Diagnosis not present

## 2023-07-19 DIAGNOSIS — R0609 Other forms of dyspnea: Secondary | ICD-10-CM | POA: Diagnosis not present

## 2023-07-19 DIAGNOSIS — E039 Hypothyroidism, unspecified: Secondary | ICD-10-CM | POA: Diagnosis not present

## 2023-07-22 ENCOUNTER — Encounter: Payer: Self-pay | Admitting: Cardiovascular Disease

## 2023-07-22 NOTE — Progress Notes (Unsigned)
 Cardiology Office Note   Date:  07/23/2023   ID:  Susan [name], Susan [name] May 30, 1933, MRN [id number]  PCP:  Rodrigo Ran, MD  Cardiologist:   Kristeen Miss, MD   Chief Complaint  Patient presents with   Congestive Heart Failure        Coronary Artery Disease   1. Coronary artery disease-status post PTCA and stenting of her right coronary artery.( 2005)  We placed a 2.75 x 16 mm Taxus stent deployed at 18 atmospheres. She also has a moderate proximal LAD stenosis. 2. Hypothyroidism 3. Hyperlipidemia-   Previous Notes:     Susan [name] is a 89 yo with a hx of CAD.  She is still playing tennis regularly.  She has not had any angina.  She has some DOE.     October 26, 2012:  Susan [name] is doing well.  She is still playing lots of tennis.  No CP.  She has had a lifeline screening - her carotid arteries are normal.  She denies episodes of chest pain or shortness of breath.  October 11, 2013:  Susan [name] is not feeling well today. "Feels like she is jumping out of her skin" Got back from a trip , she found out that a friend had died.   12/27/2013:  Susan [name] is doing ok.  She has had some eye problems.  Her eyesite is ok.    No angina.   She does have some mild dyspnea if she climbs 3-4 flights of stairs.  Still playing tennis regularly.    July 18, 2014:  Susan [name] is a 89 y.o. female who presents for follow up for her CAD. Just got back from a mission trip in [place]. Did lots of painting and cleaning. No angina or dyspnea.   Is not playing as much tennis - her tennis team has moved on.  2 of her partners have died , 1 is too unstable on her feet.   July 25, 2015:  Doing well.  Very active in her garden .  Plays tennis regularly . Plays on soft courts because of her back and knee pain .  July 28, 2016:  Susan [name] is seen today  Recent labs at [facility] reveals Chol = 155 Trigs = 113 HDL = 59 LDL = 73  She is upset today .   Her grandson OD'd on cough  syrup He is doing to a rehab place today   Saw Nada Boozer, NP on March 5 for palpitations.  Walking some.   Looking forward to getting back into tennis . No CP,  Palpitations have resolved.   Nov. 8, 2018  Doing well Still playing tennis  Plays on soft courts  No CP or dyspnea  Has occasional episodes of near syncope .  Most of the time when she is sitting down.   Last for a second or so.   Feels fine right after the episode  Has had 2 episodes over the past 6 months .   Did not pass out  Sep 22, 2017:  Doing well.   Not playing as much tennis now - most of her tennis friends have died. .    Has occasional DOE while playing tennis for a long point or walking up a hill.  No real chest pain   October 28, 2017:  [name] is seen back today for follow-up visit.  She has a history of coronary artery disease and coronary stenting.  She was having some episodes  of chest pain when I last saw her in May.  Repeat heart catheterization revealed a moderate to severe mid LAD stenosis of 60 to 70%.  This is slightly worse than it was in her heart catheterization in 2012.  She was found to have severely reduced left ventricular systolic function.  She was started on carvedilol 3.125 mg twice a day.  Feb. 11, 2021:  Susan [name] is seen today for a follow-up visit.  She has a history of chronic systolic congestive heart failure.  Her ejection fraction was 30 to 35%.  Echocardiogram performed in July, 2020 revealed a slightly improved LV function of 40 to 45%.  She does have some dyssynergy due to her left bundle branch block.  She is admitted to [facility] in July, 2020 with near syncope and symptoms consistent with orthostatic hypotension.  She had had little to eat that day and was rushing around all day.  She fell and fractured her clavicle.  She has had a few other episodes of orthostasis .  She has warning of a few seconds.  She cut her Coreg to 3. 125 a day due to orthostasis .  She cannot tel  if her symptoms are better but she hasnt fallen.     October 04, 2020: Susan [name] is seen today for follow up of her CAD  has not been playing tennis following a fall several years ago   Seems to be doing well .    Still falls on rare occasion .   Is walking on occasion  Works in her yard.  Does not need a walker or cane at this point.   Is taking her carvedilol only 1 time a day .   October 06, 2021:  Susan [name] is seen today for follow-up visit.  She has a history of coronary artery disease.  She also has a left bundle branch block and has developed congestive heart failure.  Echo 8/19 - EF 30-35% Echo  7/20 - EF 40-455 Echo 11/21 - EF 35-40%, grade II DD   Doing well. Has been having some "different " chest pain . Did not take NTG.  Took an ASA Walks quite a bit.   [place] neighborhood.  Has some DOE in the hills.   Does not have CP with walking .  Is not playing tennis ( fell several years ago so has not been playing tennis any longer )   We discussed her low EF .  We will repeat her echocardiogram.  We did discuss the possibility that she may need to be referred to electrophysiology for CRT pacing.    November 30, 2022 Susan [name] is seen for follow up of her CAD, CHF, LBBB  Echo from June, 2023 shows EF 30-35%  She had an episode of pre-syncope while at church and was seen by Robin Searing, NP in March , 2024 Was seen by the church nurse and evaluated .  She did not fall,    7 day Zio monitor -  NSR with 37 episodes of nonsustained VT.   The fastest episode lasted for 10.1 sec at an average HR of 215.  We have been limited in increasing her CHF meds due to her Hypotension  Is walking regularly  No CP , Mild dyspnea when she climbs stairs   Is concerned that her BP is too low  We discussed being careful with standing up too quickly  We discussed the possibility  of stopping the Losartan. We would  definitely stop the Losartan if she has syncope    July 23, 2023 Susan [name] is seen for follow up of her CAD, CHF, Had 2 episodes of CP over the past several weeks  1st episode occurred after she picked up a load of laundry Put the laundrey  down,  pain resolved after a minute   2nd episode occurred while she was walking up a hill  Deep chest pressure , " pushing in sensation " ,  associated with shortness of breath   Resolved after a minute or so after she stopped walking  Work up at primary MD looked ok  She has 2 stents in the right coronary artery.   She has a moderate 70% stenosis in the proximal/mid LAD that we have been following and treating medically.  Will get a [employer] study.  Will add isosorbide 30 mg a day.  Will discontinue losartan to allow [place] to add isosorbide without causing significant hypertension.  I will see her again in 3 to 4 weeks for follow-up visit.    Past Medical History:  Diagnosis Date   Chronic HFrEF (heart failure with reduced ejection fraction) (HCC)    Coronary artery disease    PTCA/stenting of RCA 2005, moderate residual 70% stenosis of LAD stable by last cath 2021   Frequent PVCs    Hyperlipidemia    Hypothyroidism    LBBB (left bundle branch block)    Mild carotid artery disease (HCC)    Osteoporosis    Syncope     Past Surgical History:  Procedure Laterality Date   APPENDECTOMY  1970s?   "ruptured"    BACK SURGERY     CATARACT EXTRACTION W/ INTRAOCULAR LENS  IMPLANT, BILATERAL Bilateral    CORONARY ANGIOPLASTY WITH STENT PLACEMENT  ~ 2009   "2 stents"   FRACTURE SURGERY     IR VERTEBROPLASTY CERV/THOR BX INC UNI/BIL INC/INJECT/IMAGING  02/07/2018   KNEE ARTHROSCOPY Right     torn meniscus   LEFT HEART CATH AND CORONARY ANGIOGRAPHY N/A 03/21/2020   Procedure: LEFT HEART CATH AND CORONARY ANGIOGRAPHY;  Surgeon: Runell Gess, MD;  Location: [facility];  Service: Cardiovascular;  Laterality: N/A;   LUMBAR LAMINECTOMY  1978   OVARIAN CYST SURGERY  1970s?   "ruptured"     RIGHT/LEFT HEART CATH AND CORONARY ANGIOGRAPHY N/A 10/08/2017   Procedure: RIGHT/LEFT HEART CATH AND CORONARY ANGIOGRAPHY;  Surgeon: Yvonne Kendall, MD;  Location: [facility];  Service: Cardiovascular;  Laterality: N/A;   WRIST FRACTURE SURGERY Bilateral    "2 surgeries for 2 breaks on left side; 1 surgery for 1 break on right wrist"     Current Outpatient Medications  Medication Sig Dispense Refill   acetaminophen (TYLENOL) 325 MG tablet Take 2 tablets (650 mg total) by mouth every 6 (six) hours.     Calcium Carbonate-Vitamin D (CALCIUM + D PO) Take 2 tablets by mouth at bedtime.     cholecalciferol (VITAMIN D3) 25 MCG (1000 UT) tablet Take 1,000 Units by mouth daily after breakfast.     clopidogrel (PLAVIX) 75 MG tablet Take 1 tablet by mouth once daily 90 tablet 2   levothyroxine (SYNTHROID, LEVOTHROID) 75 MCG tablet Take 75 mcg by mouth daily before breakfast.   0   losartan (COZAAR) 25 MG tablet TAKE 1/2 (ONE-HALF) TABLET BY MOUTH AT BEDTIME 45 tablet 3   multivitamin-lutein (OCUVITE-LUTEIN) CAPS capsule Take 1 capsule by mouth 2 (two) times a day.  nitroGLYCERIN (NITROSTAT) 0.4 MG SL tablet Place 1 tablet (0.4 mg total) under the tongue every 5 (five) minutes as needed. (Patient taking differently: Place 0.4 mg under the tongue every 5 (five) minutes as needed for chest pain.) 25 tablet 3   rosuvastatin (CRESTOR) 40 MG tablet Take 1 tablet (40 mg total) by mouth daily. 90 tablet 3   venlafaxine XR (EFFEXOR-XR) 150 MG 24 hr capsule Take 1 capsule (150 mg total) by mouth daily with breakfast. 90 capsule 2   vitamin B-12 (CYANOCOBALAMIN) 1000 MCG tablet Take 2,000 mcg by mouth daily.      No current facility-administered medications for this visit.    Allergies:   Patient has no known allergies.    Social History:  The patient  reports that she has never smoked. She has never used smokeless tobacco. She reports current alcohol use of about 1.0 standard drink of alcohol per  week. She reports that she does not use drugs.   Family History:  The patient's family history includes Alzheimer's disease in her mother; Aneurysm in her sister; Heart attack in her father.    ROS:    Noted in current history, otherwise review of systems is negative.    Physical Exam: Blood pressure 114/80, pulse 86, height 5\' 3"  (1.6 m), weight 133 lb (60.3 kg), SpO2 96%.       GEN:  Well nourished, well developed in no acute distress HEENT: Normal NECK: No JVD; No carotid bruits LYMPHATICS: No lymphadenopathy CARDIAC: RRR, no murmurs, rubs, gallops RESPIRATORY:  Clear to auscultation without rales, wheezing or rhonchi  ABDOMEN: Soft, non-tender, non-distended MUSCULOSKELETAL:  No edema; No deformity  SKIN: Warm and dry NEUROLOGIC:  Alert and oriented x 3     EKG:            Recent Labs: 03/06/2023: ALT 12; BUN 22; Creatinine, Ser 0.83; Hemoglobin 11.9; Platelets 199; Potassium 3.3; Sodium 143    Lipid Panel    Component Value Date/Time   CHOL 109 03/21/2020 0856   TRIG 38 03/21/2020 0856   HDL 57 03/21/2020 0856   CHOLHDL 1.9 03/21/2020 0856   VLDL 8 03/21/2020 0856   LDLCALC 44 03/21/2020 0856      Wt Readings from Last 3 Encounters:  07/23/23 133 lb (60.3 kg)  05/31/23 135 lb 3.2 oz (61.3 kg)  11/30/22 134 lb 4.8 oz (60.9 kg)      Other studies Reviewed: Additional studies/ records that were reviewed today include: . Review of the above records demonstrates:    ASSESSMENT AND PLAN:  1. Coronary artery disease-    Susan [name] presents with exertional CP  that is worrisome for unstable angina .   She has 2 stents in the right coronary artery.   She has a moderate 70% stenosis in the proximal/mid LAD that we have been following and treating medically.  Will get a [employer] study.  Will add isosorbide 30 mg a day.  Will discontinue losartan to allow [place] to add isosorbide without causing significant hypertension.  I will see her again in 3 to 4  weeks for follow-up visit.       2.  Chronic systolic CHF:   -  EF is 30-35%      3.  Carotid artery disease:        3. Hyperlipidemia- stable         Current medicines are reviewed at length with the patient today.  The patient does not have concerns regarding medicines.  The following changes have been made:  no change  Labs/ tests ordered today include:   No orders of the defined types were placed in this encounter. - Carotid duplex scan      Signed, Kristeen Miss, MD  07/23/2023 12:23 PM    [facility] [address] [place], [place], [place]  [address] Phone: [phone]; Fax: [phone]

## 2023-07-23 ENCOUNTER — Encounter: Payer: Self-pay | Admitting: Cardiovascular Disease

## 2023-07-23 ENCOUNTER — Encounter (HOSPITAL_COMMUNITY): Payer: Self-pay

## 2023-07-23 ENCOUNTER — Ambulatory Visit: Attending: Cardiovascular Disease | Admitting: Cardiovascular Disease

## 2023-07-23 VITALS — BP 114/80 | HR 86 | Ht 63.0 in | Wt 133.0 lb

## 2023-07-23 DIAGNOSIS — I2089 Other forms of angina pectoris: Secondary | ICD-10-CM

## 2023-07-23 MED ORDER — ISOSORBIDE MONONITRATE ER 30 MG PO TB24: 30.0000 mg | ORAL_TABLET | Freq: Every day | ORAL | 3 refills | Status: DC

## 2023-07-23 NOTE — Patient Instructions (Signed)
 Medication Instructions:  STOP Losartan START Imdur/Isosorbide Mononitrate 30mg  daily *If you need a refill on your cardiac medications before your next appointment, please call your pharmacy*  Testing/Procedures: Steffanie Dunn Stress Test Your physician has requested that you have a lexiscan myoview. For further information please visit [email or web address] Please follow instruction sheet, as given.  Follow-Up: At [facility], you and your health needs are our priority.  As part of our continuing mission to provide you with exceptional heart care, we have created designated Provider Care Teams.  These Care Teams include your primary Cardiologist (physician) and Advanced Practice Providers (APPs -  Physician Assistants and Nurse Practitioners) who all work together to provide you with the care you need, when you need it.  Your next appointment:   As scheduled  Provider:   Kristeen Miss, MD     Other Instructions   1st Floor: - Lobby - Registration  - Pharmacy  - Lab - Cafe  2nd Floor: - PV Lab - Diagnostic Testing (echo, CT, nuclear med)  3rd Floor: - Vacant  4th Floor: - TCTS (cardiothoracic surgery) - AFib Clinic - Structural Heart Clinic - Vascular Surgery  - Vascular Ultrasound  5th Floor: - HeartCare Cardiology (general and EP) - Clinical Pharmacy for coumadin, hypertension, lipid, weight-loss medications, and med management appointments    Valet parking services will be available as well.

## 2023-07-28 ENCOUNTER — Telehealth (HOSPITAL_COMMUNITY): Payer: Self-pay | Admitting: *Deleted

## 2023-07-28 NOTE — Telephone Encounter (Signed)
 Left a detailed message per DPR about a STRESS TEST on 07/30/23 at 7:45. Should she have any questions or concerns she may call the office.

## 2023-07-30 ENCOUNTER — Ambulatory Visit (HOSPITAL_COMMUNITY): Attending: Cardiovascular Disease

## 2023-07-30 ENCOUNTER — Encounter: Payer: Self-pay | Admitting: Cardiovascular Disease

## 2023-07-30 DIAGNOSIS — I2089 Other forms of angina pectoris: Secondary | ICD-10-CM | POA: Insufficient documentation

## 2023-07-30 LAB — MYOCARDIAL PERFUSION IMAGING
LV dias vol: 158 mL (ref 46–106)
LV sys vol: 111 mL
Nuc Stress EF: 29 %
Peak HR: 92 {beats}/min
Rest HR: 78 {beats}/min
Rest Nuclear Isotope Dose: 11 mCi
SDS: 0
SRS: 2
SSS: 2
ST Depression (mm): 0 mm
Stress Nuclear Isotope Dose: 32.2 mCi
TID: 1.1

## 2023-07-30 MED ORDER — TECHNETIUM TC 99M TETROFOSMIN IV KIT
32.2000 | PACK | Freq: Once | INTRAVENOUS | Status: AC | PRN
  Administered 2023-07-30: 32.2 via INTRAVENOUS

## 2023-07-30 MED ORDER — REGADENOSON 0.4 MG/5ML IV SOLN
0.4000 mg | Freq: Once | INTRAVENOUS | Status: AC
  Administered 2023-07-30: 0.4 mg via INTRAVENOUS

## 2023-07-30 MED ORDER — TECHNETIUM TC 99M TETROFOSMIN IV KIT
11.0000 | PACK | Freq: Once | INTRAVENOUS | Status: AC | PRN
  Administered 2023-07-30: 11 via INTRAVENOUS

## 2023-08-02 ENCOUNTER — Telehealth: Payer: Self-pay

## 2023-08-02 DIAGNOSIS — R931 Abnormal findings on diagnostic imaging of heart and coronary circulation: Secondary | ICD-10-CM

## 2023-08-02 NOTE — Telephone Encounter (Signed)
 Called patient and explained the need for ECHO. She agrees to plan. order placed at this time and scheduled for 08/06/23.

## 2023-08-02 NOTE — Telephone Encounter (Signed)
-----   Message from Susan [name] sent at 07/30/2023  5:33 PM EDT ----- The study showed no evidence of ischemia or previous infarction.  She does have a fixed defect that appears to be due to her left bundle branch block.  LVEF is severely reduced at 29%.  Lets get an echocardiogram to verify her LV function.  She has seen electrophysiology a year and a half ago.  She was seen by Dr. Graciela Husbands and his thoughts were that because her QRS duration was not very long that CRT therapy would not be of any benefit.  He suggested starting losartan 12.5 mg a day.  She recalls that her blood pressure frequently dropped and she thinks that that is likely why it was eventually stopped.  Her blood pressure at present is still very low so I do not think that we will be able to add ARB or Entresto.  We could consider adding Jardiance or [place].  She has been having severe headaches on the isosorbide 30 mg tablets.  She will try breaking them in half and seeing if she is able to tolerate that.  If she is not able to tolerate I have given her the okay to discontinue the medication.  She has an appointment to see me on April 15.  Will try to get the echocardiogram before then .

## 2023-08-02 NOTE — Telephone Encounter (Signed)
-----   Message from Kristeen Miss sent at 07/30/2023  5:33 PM EDT ----- The study showed no evidence of ischemia or previous infarction.  She does have a fixed defect that appears to be due to her left bundle branch block.  LVEF is severely reduced at 29%.  Lets get an echocardiogram to verify her LV function.  She has seen electrophysiology a year and a half ago.  She was seen by Dr. Graciela Husbands and his thoughts were that because her QRS duration was not very long that CRT therapy would not be of any benefit.  He suggested starting losartan 12.5 mg a day.  She recalls that her blood pressure frequently dropped and she thinks that that is likely why it was eventually stopped.  Her blood pressure at present is still very low so I do not think that we will be able to add ARB or Entresto.  We could consider adding Jardiance or [place].  She has been having severe headaches on the isosorbide 30 mg tablets.  She will try breaking them in half and seeing if she is able to tolerate that.  If she is not able to tolerate I have given her the okay to discontinue the medication.  She has an appointment to see me on April 15.  Will try to get the echocardiogram before then .

## 2023-08-06 ENCOUNTER — Ambulatory Visit (HOSPITAL_COMMUNITY): Attending: Cardiology

## 2023-08-06 DIAGNOSIS — R0602 Shortness of breath: Secondary | ICD-10-CM

## 2023-08-06 DIAGNOSIS — R931 Abnormal findings on diagnostic imaging of heart and coronary circulation: Secondary | ICD-10-CM | POA: Diagnosis not present

## 2023-08-06 DIAGNOSIS — I340 Nonrheumatic mitral (valve) insufficiency: Secondary | ICD-10-CM | POA: Insufficient documentation

## 2023-08-06 DIAGNOSIS — I5022 Chronic systolic (congestive) heart failure: Secondary | ICD-10-CM | POA: Diagnosis not present

## 2023-08-06 DIAGNOSIS — I2510 Atherosclerotic heart disease of native coronary artery without angina pectoris: Secondary | ICD-10-CM

## 2023-08-06 DIAGNOSIS — R079 Chest pain, unspecified: Secondary | ICD-10-CM | POA: Diagnosis not present

## 2023-08-06 LAB — ECHOCARDIOGRAM COMPLETE
Area-P 1/2: 2.61 cm2
P 1/2 time: 343 ms
S' Lateral: 4.6 cm

## 2023-08-16 ENCOUNTER — Encounter: Payer: Self-pay | Admitting: Cardiovascular Disease

## 2023-08-16 NOTE — Progress Notes (Unsigned)
 Cardiology Office Note   Date:  08/17/2023   ID:  Susan [name] 11-11-1933, MRN [id number]  PCP:  Rodrigo Ran, MD  Cardiologist:   Kristeen Miss, MD   Chief Complaint  Patient presents with   Coronary Artery Disease        Congestive Heart Failure        1. Coronary artery disease-status post PTCA and stenting of her right coronary artery.( 2005)  We placed a 2.75 x 16 mm Taxus stent deployed at 18 atmospheres. She also has a moderate proximal LAD stenosis. 2. Hypothyroidism 3. Hyperlipidemia-   Previous Notes:     Susan [name] is a 89 yo with a hx of CAD.  She is still playing tennis regularly.  She has not had any angina.  She has some DOE.     October 26, 2012:  Lexis is doing well.  She is still playing lots of tennis.  No CP.  She has had a lifeline screening - her carotid arteries are normal.  She denies episodes of chest pain or shortness of breath.  October 11, 2013:  Susan [name] is not feeling well today. "Feels like she is jumping out of her skin" Got back from a trip , she found out that a friend had died.   01/12/14:  Susan [name] is doing ok.  She has had some eye problems.  Her eyesite is ok.    No angina.   She does have some mild dyspnea if she climbs 3-4 flights of stairs.  Still playing tennis regularly.    July 18, 2014:  Susan [name] is a 89 y.o. female who presents for follow up for her CAD. Just got back from a mission trip in [place]. Did lots of painting and cleaning. No angina or dyspnea.   Is not playing as much tennis - her tennis team has moved on.  2 of her partners have died , 1 is too unstable on her feet.   July 25, 2015:  Doing well.  Very active in her garden .  Plays tennis regularly . Plays on soft courts because of her back and knee pain .  July 28, 2016:  Susan [name] is seen today  Recent labs at [facility] reveals Chol = 155 Trigs = 113 HDL = 59 LDL = 73  She is upset today .   Her grandson OD'd on  cough syrup He is doing to a rehab place today   Saw Nada Boozer, NP on March 5 for palpitations.  Walking some.   Looking forward to getting back into tennis . No CP,  Palpitations have resolved.   Nov. 8, 2018  Doing well Still playing tennis  Plays on soft courts  No CP or dyspnea  Has occasional episodes of near syncope .  Most of the time when she is sitting down.   Last for a second or so.   Feels fine right after the episode  Has had 2 episodes over the past 6 months .   Did not pass out  Sep 22, 2017:  Doing well.   Not playing as much tennis now - most of her tennis friends have died. .    Has occasional DOE while playing tennis for a long point or walking up a hill.  No real chest pain   October 28, 2017:  Susan [name] is seen back today for follow-up visit.  She has a history of coronary artery disease and coronary stenting.  She was having some episodes of chest pain when I last saw her in May.  Repeat heart catheterization revealed a moderate to severe mid LAD stenosis of 60 to 70%.  This is slightly worse than it was in her heart catheterization in 2012.  She was found to have severely reduced left ventricular systolic function.  She was started on carvedilol 3.125 mg twice a day.  Feb. 11, 2021:  Susan [name] is seen today for a follow-up visit.  She has a history of chronic systolic congestive heart failure.  Her ejection fraction was 30 to 35%.  Echocardiogram performed in July, 2020 revealed a slightly improved LV function of 40 to 45%.  She does have some dyssynergy due to her left bundle branch block.  She is admitted to [facility] in July, 2020 with near syncope and symptoms consistent with orthostatic hypotension.  She had had little to eat that day and was rushing around all day.  She fell and fractured her clavicle.  She has had a few other episodes of orthostasis .  She has warning of a few seconds.  She cut her Coreg to 3. 125 a day due to orthostasis .  She  cannot tel if her symptoms are better but she hasnt fallen.     October 04, 2020: Susan [name] is seen today for follow up of her CAD  has not been playing tennis following a fall several years ago   Seems to be doing well .    Still falls on rare occasion .   Is walking on occasion  Works in her yard.  Does not need a walker or cane at this point.   Is taking her carvedilol only 1 time a day .   October 06, 2021:  Susan [name] is seen today for follow-up visit.  She has a history of coronary artery disease.  She also has a left bundle branch block and has developed congestive heart failure.  Echo 8/19 - EF 30-35% Echo  7/20 - EF 40-455 Echo 11/21 - EF 35-40%, grade II DD   Doing well. Has been having some "different " chest pain . Did not take NTG.  Took an ASA Walks quite a bit.   [place] neighborhood.  Has some DOE in the hills.   Does not have CP with walking .  Is not playing tennis ( fell several years ago so has not been playing tennis any longer )   We discussed her low EF .  We will repeat her echocardiogram.  We did discuss the possibility that she may need to be referred to electrophysiology for CRT pacing.    November 30, 2022 [name] is seen for follow up of her CAD, CHF, LBBB  Echo from June, 2023 shows EF 30-35%  She had an episode of pre-syncope while at church and was seen by Charles Connor, NP in March , 2024 Was seen by the church nurse and evaluated .  She did not fall,    7 day Zio monitor -  NSR with 37 episodes of nonsustained VT.   The fastest episode lasted for 10.1 sec at an average HR of 215.  We have been limited in increasing her CHF meds due to her Hypotension  Is walking regularly  No CP , Mild dyspnea when she climbs stairs   Is concerned that her BP is too low  We discussed being careful with standing up too quickly  We discussed the possibility  of  stopping the Losartan. We would definitely stop the Losartan if she has syncope     July 23, 2023 Susan [name] is seen for follow up of her CAD, CHF, Had 2 episodes of CP over the past several weeks  1st episode occurred after she picked up a load of laundry Put the laundrey  down,  pain resolved after a minute   2nd episode occurred while she was walking up a hill  Deep chest pressure , " pushing in sensation " ,  associated with shortness of breath   Resolved after a minute or so after she stopped walking  Work up at primary MD looked ok  She has 2 stents in the right coronary artery.   She has a moderate 70% stenosis in the proximal/mid LAD that we have been following and treating medically.  Will get a [employer] study.  Will add isosorbide 30 mg a day.  Will discontinue losartan to allow [place] to add isosorbide without causing significant hypertension.  I will see her again in 3 to 4 weeks for follow-up visit.  August 17, 2023: Dymin is seen for follow-up of her recent visit.  She has been having episodes of chest pain.  She has known coronary artery disease.  Echocardiogram from August 06, 2023 reveals severely depressed LVEF at 25 to 30%. There is moderate mitral regurgitation, moderate aortic insufficiency She has been evaluated by electrophysiology and was deemed to be not a candidate for resynchronization therapy.  Lexiscan Myoview study from July 30, 2023 revealed no evidence of ischemia ischemia or previous infarction.  She does have a fixed defect that appears to be due to her left bundle branch block.  Her LVEF is severely reduced at 29%. She returns today to follow-up for her chest pain and congestive heart failure.  She has had several episides of pronounced dyspnea with heart pounding .   Says her heart beats very fast when these spells occur. She feels some chest pressure, short of breath,  not necessarily light headed ( but she sits down quickly to prevent her from falling  She has not taken the IMdur enough to know if these episodes are better  or worse with the imdur . The spells might last a few minutes  Will place a 7-day ZIO monitor.  It is possible that these episodes are ventricular tachycardia or another arrhythmia.  If that is the case we will start her on a beta-blocker or consider amiodarone.  For now we will continue to have her take isosorbide 15 mg a day.    She has not been tolerating the Imdur very well.  Trying 1/2 Imdur ( 15 mg ) to try to minimize the headache  Her myoview study on March 28 showed no ischemia .   It showed a fixed defect likey due to her LBBB .   Past Medical History:  Diagnosis Date   Chronic HFrEF (heart failure with reduced ejection fraction) (HCC)    Coronary artery disease    PTCA/stenting of RCA 2005, moderate residual 70% stenosis of LAD stable by last cath 2021   Frequent PVCs    Hyperlipidemia    Hypothyroidism    LBBB (left bundle branch block)    Mild carotid artery disease (HCC)    Osteoporosis    Syncope     Past Surgical History:  Procedure Laterality Date   APPENDECTOMY  1970s?   "ruptured"    BACK SURGERY     CATARACT EXTRACTION W/ INTRAOCULAR LENS  IMPLANT, BILATERAL Bilateral    CORONARY ANGIOPLASTY WITH STENT PLACEMENT  ~ 2009   "2 stents"   FRACTURE SURGERY     IR VERTEBROPLASTY CERV/THOR BX INC UNI/BIL INC/INJECT/IMAGING  02/07/2018   KNEE ARTHROSCOPY Right     torn meniscus   LEFT HEART CATH AND CORONARY ANGIOGRAPHY N/A 03/21/2020   Procedure: LEFT HEART CATH AND CORONARY ANGIOGRAPHY;  Surgeon: Avanell Leigh, MD;  Location: [facility];  Service: Cardiovascular;  Laterality: N/A;   LUMBAR LAMINECTOMY  1978   OVARIAN CYST SURGERY  1970s?   "ruptured"    RIGHT/LEFT HEART CATH AND CORONARY ANGIOGRAPHY N/A 10/08/2017   Procedure: RIGHT/LEFT HEART CATH AND CORONARY ANGIOGRAPHY;  Surgeon: Sammy Crisp, MD;  Location: [facility];  Service: Cardiovascular;  Laterality: N/A;   WRIST FRACTURE SURGERY Bilateral    "2 surgeries for 2 breaks on left  side; 1 surgery for 1 break on right wrist"     Current Outpatient Medications  Medication Sig Dispense Refill   acetaminophen (TYLENOL) 325 MG tablet Take 2 tablets (650 mg total) by mouth every 6 (six) hours.     Calcium Carbonate-Vitamin D (CALCIUM + D PO) Take 2 tablets by mouth at bedtime.     cholecalciferol (VITAMIN D3) 25 MCG (1000 UT) tablet Take 1,000 Units by mouth daily after breakfast.     clopidogrel (PLAVIX) 75 MG tablet Take 1 tablet by mouth once daily 90 tablet 2   isosorbide mononitrate (IMDUR) 30 MG 24 hr tablet Take 1 tablet (30 mg total) by mouth daily. 90 tablet 3   levothyroxine (SYNTHROID, LEVOTHROID) 75 MCG tablet Take 75 mcg by mouth daily before breakfast.   0   multivitamin-lutein (OCUVITE-LUTEIN) CAPS capsule Take 1 capsule by mouth 2 (two) times a day.      nitroGLYCERIN (NITROSTAT) 0.4 MG SL tablet Place 1 tablet (0.4 mg total) under the tongue every 5 (five) minutes as needed. (Patient taking differently: Place 0.4 mg under the tongue every 5 (five) minutes as needed for chest pain.) 25 tablet 3   rosuvastatin (CRESTOR) 40 MG tablet Take 1 tablet (40 mg total) by mouth daily. 90 tablet 3   venlafaxine XR (EFFEXOR-XR) 150 MG 24 hr capsule Take 1 capsule (150 mg total) by mouth daily with breakfast. 90 capsule 2   vitamin B-12 (CYANOCOBALAMIN) 1000 MCG tablet Take 2,000 mcg by mouth daily.      No current facility-administered medications for this visit.    Allergies:   Patient has no known allergies.    Social History:  The patient  reports that she has never smoked. She has never used smokeless tobacco. She reports current alcohol use of about 1.0 standard drink of alcohol per week. She reports that she does not use drugs.   Family History:  The patient's family history includes Alzheimer's disease in her mother; Aneurysm in her sister; Heart attack in her father.    ROS:    Noted in current history, otherwise review of systems is negative.   Physical  Exam: Blood pressure 109/68, pulse 98, height 5\' 3"  (1.6 m), weight 132 lb 6.4 oz (60.1 kg), SpO2 98%.       GEN: Elderly  , somewhat frail female in no acute distress HEENT: Normal NECK: No JVD; No carotid bruits LYMPHATICS: No lymphadenopathy CARDIAC: RRR , no murmurs, rubs, gallops RESPIRATORY:  Clear to auscultation without rales, wheezing or rhonchi  ABDOMEN: Soft, non-tender, non-distended MUSCULOSKELETAL:  No edema; No deformity  SKIN:  Warm and dry NEUROLOGIC:  Alert and oriented x 3    EKG:          Recent Labs: 03/06/2023: ALT 12; BUN 22; Creatinine, Ser 0.83; Hemoglobin 11.9; Platelets 199; Potassium 3.3; Sodium 143    Lipid Panel    Component Value Date/Time   CHOL 109 03/21/2020 0856   TRIG 38 03/21/2020 0856   HDL 57 03/21/2020 0856   CHOLHDL 1.9 03/21/2020 0856   VLDL 8 03/21/2020 0856   LDLCALC 44 03/21/2020 0856      Wt Readings from Last 3 Encounters:  08/17/23 132 lb 6.4 oz (60.1 kg)  07/30/23 133 lb (60.3 kg)  07/23/23 133 lb (60.3 kg)      Other studies Reviewed: Additional studies/ records that were reviewed today include: . Review of the above records demonstrates:    ASSESSMENT AND PLAN:  1. Coronary artery disease-Launa has moderate coronary artery disease.  Her Myoview study did not show any evidence of ischemia or infarction.  She has not apical septal defect that is likely consistent with her left bundle branch block.  Will continue isosorbide for now.   She is having spells of sudden shortness of breath and tachycardia.  Will place a 7-day ZIO monitor on her for further evaluation.  It is possible that these are due to ventricular tachycardia or perhaps another arrhythmia.  Will consider starting her on a beta-blocker or perhaps amiodarone if that is the case.  I will plan on seeing her again in approximately 1 month.        2.  Chronic systolic CHF:   -  EF is 30-35% She is not a candidate for CRT pacing.  She did not  tolerate Entresto.  Her losartan is currently on hold while were trying to see if isosorbide helps.    3.  Carotid artery disease:        3. Hyperlipidemia- stable         Current medicines are reviewed at length with the patient today.  The patient does not have concerns regarding medicines.  The following changes have been made:  no change  Labs/ tests ordered today include:   No orders of the defined types were placed in this encounter. - Carotid duplex scan      Signed, Ahmad Alert, MD  08/17/2023 2:00 PM    [facility] [address] [place], [place], [place]  [address] Phone: [phone]; Fax: [phone]

## 2023-08-17 ENCOUNTER — Ambulatory Visit (INDEPENDENT_AMBULATORY_CARE_PROVIDER_SITE_OTHER)

## 2023-08-17 ENCOUNTER — Ambulatory Visit: Attending: Cardiovascular Disease | Admitting: Cardiovascular Disease

## 2023-08-17 ENCOUNTER — Encounter: Payer: Self-pay | Admitting: Cardiovascular Disease

## 2023-08-17 VITALS — BP 109/68 | HR 98 | Ht 63.0 in | Wt 132.4 lb

## 2023-08-17 DIAGNOSIS — R002 Palpitations: Secondary | ICD-10-CM

## 2023-08-17 NOTE — Patient Instructions (Signed)
 Medication Instructions:  Your physician recommends that you continue on your current medications as directed. Please refer to the Current Medication list given to you today.  *If you need a refill on your cardiac medications before your next appointment, please call your pharmacy*  Testing/Procedures: Your physician has requested that you wear a Zio heart monitor for 7 days. This will be mailed to your home with instructions on how to apply the monitor and how to return it when finished. Please allow 2 weeks after returning the heart monitor before our office calls you with the results.   Follow-Up: At [facility], you and your health needs are our priority.  As part of our continuing mission to provide you with exceptional heart care, we have created designated Provider Care Teams.  These Care Teams include your primary Cardiologist (physician) and Advanced Practice Providers (APPs -  Physician Assistants and Nurse Practitioners) who all work together to provide you with the care you need, when you need it.  Your next appointment:   In 1 month on Sep 13, 2023 at 1:40 PM  The format for your next appointment:   In Person  Provider:   Ahmad Alert, MD {  Other Instructions ZIO XT- Long Term Monitor Instructions     Your physician has requested you wear a ZIO patch monitor for 7 days.  This is a single patch monitor. [employer] supplies one patch monitor per enrollment. Additional  stickers are not available. Please do not apply patch if you will be having a Nuclear Stress Test,  Echocardiogram, Cardiac CT, MRI, or Chest Xray during the period you would be wearing the  monitor. The patch cannot be worn during these tests. You cannot remove and re-apply the  ZIO XT patch monitor.  Your ZIO patch monitor will be mailed 3 day USPS to your address on file. It may take 3-5 days  to receive your monitor after you have been enrolled.  Once you have received your monitor, please review the  enclosed instructions. Your monitor  has already been registered assigning a specific monitor serial # to you.     Billing and Patient Assistance Program Information     We have supplied [employer] with any of your insurance information on file for billing purposes.  [employer] offers a sliding scale Patient Assistance Program for patients that do not have  insurance, or whose insurance does not completely cover the cost of the ZIO monitor.  You must apply for the Patient Assistance Program to qualify for this discounted rate.  To apply, please call [employer] at [phone], select option 4, select option 2, ask to apply for  Patient Assistance Program. Sanna Crystal will ask your household income, and how many people  are in your household. They will quote your out-of-pocket cost based on that information.  [employer] will also be able to set up a 37-month, interest-free payment plan if needed.     Applying the monitor     Shave hair from upper left chest.  Hold abrader disc by orange tab. Rub abrader in 40 strokes over the upper left chest as  indicated in your monitor instructions.  Clean area with 4 enclosed alcohol pads. Let dry.  Apply patch as indicated in monitor instructions. Patch will be placed under collarbone on left  side of chest with arrow pointing upward.  Rub patch adhesive wings for 2 minutes. Remove white label marked "1". Remove the white  label marked "2". Rub patch adhesive wings for 2  additional minutes.  While looking in a mirror, press and release button in center of patch. A small green light will  flash 3-4 times. This will be your only indicator that the monitor has been turned on.  Do not shower for the first 24 hours. You may shower after the first 24 hours.  Press the button if you feel a symptom. You will hear a small click. Record Date, Time and  Symptom in the Patient Logbook.  When you are ready to remove the patch, follow instructions on the last 2 pages of Patient   Logbook. Stick patch monitor onto the last page of Patient Logbook.  Place Patient Logbook in the blue and white box. Use locking tab on box and tape box closed  securely. The blue and white box has prepaid postage on it. Please place it in the mailbox as  soon as possible. Your physician should have your test results approximately 7 days after the  monitor has been mailed back to [facility].  Call [facility] Customer Care at [phone] if you have questions regarding  your ZIO XT patch monitor. Call them immediately if you see an orange light blinking on your  monitor.  If your monitor falls off in less than 4 days, contact our Monitor department at [phone].  If your monitor becomes loose or falls off after 4 days call [employer] at [phone] for  suggestions on securing your monitor.

## 2023-08-17 NOTE — Progress Notes (Unsigned)
 Applied a 7 day Zio XT monitor to patient in the office

## 2023-08-28 DIAGNOSIS — R002 Palpitations: Secondary | ICD-10-CM | POA: Diagnosis not present

## 2023-08-30 ENCOUNTER — Observation Stay (HOSPITAL_COMMUNITY)
Admission: EM | Admit: 2023-08-30 | Discharge: 2023-09-01 | Disposition: A | Discharge status: Home / Self Care | Attending: Student | Admitting: Student

## 2023-08-30 ENCOUNTER — Emergency Department (HOSPITAL_COMMUNITY)

## 2023-08-30 ENCOUNTER — Other Ambulatory Visit: Payer: Self-pay

## 2023-08-30 ENCOUNTER — Encounter (HOSPITAL_COMMUNITY): Payer: Self-pay

## 2023-08-30 DIAGNOSIS — R079 Chest pain, unspecified: Secondary | ICD-10-CM | POA: Diagnosis not present

## 2023-08-30 DIAGNOSIS — R55 Syncope and collapse: Secondary | ICD-10-CM | POA: Diagnosis not present

## 2023-08-30 DIAGNOSIS — E876 Hypokalemia: Secondary | ICD-10-CM | POA: Diagnosis not present

## 2023-08-30 DIAGNOSIS — I5022 Chronic systolic (congestive) heart failure: Secondary | ICD-10-CM | POA: Diagnosis present

## 2023-08-30 DIAGNOSIS — F419 Anxiety disorder, unspecified: Secondary | ICD-10-CM | POA: Diagnosis present

## 2023-08-30 DIAGNOSIS — J9811 Atelectasis: Secondary | ICD-10-CM | POA: Diagnosis not present

## 2023-08-30 DIAGNOSIS — Z955 Presence of coronary angioplasty implant and graft: Secondary | ICD-10-CM | POA: Insufficient documentation

## 2023-08-30 DIAGNOSIS — E039 Hypothyroidism, unspecified: Secondary | ICD-10-CM | POA: Diagnosis present

## 2023-08-30 DIAGNOSIS — R0602 Shortness of breath: Secondary | ICD-10-CM | POA: Insufficient documentation

## 2023-08-30 DIAGNOSIS — Z7902 Long term (current) use of antithrombotics/antiplatelets: Secondary | ICD-10-CM | POA: Diagnosis not present

## 2023-08-30 DIAGNOSIS — I5042 Chronic combined systolic (congestive) and diastolic (congestive) heart failure: Secondary | ICD-10-CM | POA: Insufficient documentation

## 2023-08-30 DIAGNOSIS — I2510 Atherosclerotic heart disease of native coronary artery without angina pectoris: Secondary | ICD-10-CM | POA: Diagnosis present

## 2023-08-30 DIAGNOSIS — N1831 Chronic kidney disease, stage 3a: Secondary | ICD-10-CM | POA: Insufficient documentation

## 2023-08-30 DIAGNOSIS — R7989 Other specified abnormal findings of blood chemistry: Secondary | ICD-10-CM

## 2023-08-30 DIAGNOSIS — R42 Dizziness and giddiness: Secondary | ICD-10-CM | POA: Diagnosis not present

## 2023-08-30 DIAGNOSIS — F32A Depression, unspecified: Secondary | ICD-10-CM | POA: Diagnosis not present

## 2023-08-30 DIAGNOSIS — R0789 Other chest pain: Principal | ICD-10-CM | POA: Insufficient documentation

## 2023-08-30 DIAGNOSIS — I471 Supraventricular tachycardia, unspecified: Secondary | ICD-10-CM | POA: Insufficient documentation

## 2023-08-30 DIAGNOSIS — E785 Hyperlipidemia, unspecified: Secondary | ICD-10-CM | POA: Insufficient documentation

## 2023-08-30 DIAGNOSIS — I429 Cardiomyopathy, unspecified: Secondary | ICD-10-CM

## 2023-08-30 DIAGNOSIS — R911 Solitary pulmonary nodule: Secondary | ICD-10-CM | POA: Insufficient documentation

## 2023-08-30 DIAGNOSIS — I517 Cardiomegaly: Secondary | ICD-10-CM | POA: Diagnosis not present

## 2023-08-30 LAB — BRAIN NATRIURETIC PEPTIDE: B Natriuretic Peptide: 859.6 pg/mL — ABNORMAL HIGH (ref 0.0–100.0)

## 2023-08-30 LAB — BASIC METABOLIC PANEL WITH GFR
Anion gap: 12 (ref 5–15)
BUN: 9 mg/dL (ref 8–23)
CO2: 20 mmol/L — ABNORMAL LOW (ref 22–32)
Calcium: 9.5 mg/dL (ref 8.9–10.3)
Chloride: 105 mmol/L (ref 98–111)
Creatinine, Ser: 0.93 mg/dL (ref 0.44–1.00)
GFR, Estimated: 59 mL/min — ABNORMAL LOW (ref >60)
Glucose, Bld: 104 mg/dL — ABNORMAL HIGH (ref 70–99)
Potassium: 3.9 mmol/L (ref 3.5–5.1)
Sodium: 137 mmol/L (ref 135–145)

## 2023-08-30 LAB — TROPONIN I (HIGH SENSITIVITY): Troponin I (High Sensitivity): 24 ng/L — ABNORMAL HIGH (ref <18)

## 2023-08-30 LAB — CBC
HCT: 38.3 % (ref 36.0–46.0)
Hemoglobin: 12.8 g/dL (ref 12.0–15.0)
MCH: 32.8 pg (ref 26.0–34.0)
MCHC: 33.4 g/dL (ref 30.0–36.0)
MCV: 98.2 fL (ref 80.0–100.0)
Platelets: 205 10*3/uL (ref 150–400)
RBC: 3.9 MIL/uL (ref 3.87–5.11)
RDW: 14.6 % (ref 11.5–15.5)
WBC: 9.5 10*3/uL (ref 4.0–10.5)
nRBC: 0 % (ref 0.0–0.2)

## 2023-08-30 MED ORDER — IOHEXOL 350 MG/ML SOLN
75.0000 mL | Freq: Once | INTRAVENOUS | Status: AC | PRN
  Administered 2023-08-30: 75 mL via INTRAVENOUS

## 2023-08-30 MED ORDER — ACETAMINOPHEN 500 MG PO TABS
1000.0000 mg | ORAL_TABLET | Freq: Once | ORAL | Status: AC
  Administered 2023-08-30: 1000 mg via ORAL
  Filled 2023-08-30: qty 2

## 2023-08-30 NOTE — ED Provider Notes (Incomplete)
 [place] [facility] Provider Note   CSN: [id number] Arrival date & time: 08/30/23  2120     History {Add pertinent medical, surgical, social history, OB history to HPI:1} Chief Complaint  Patient presents with  . Chest Pain    Susan [name] is a 89 y.o. female.  Patient with past medical history significant for chronic systolic CHF, pituitary adenoma, anxiety, cardiomyopathy, CAD, previous heart cath with coronary angiography presents to the Emergency Department complaining of chest pain and shortness of breath.  Chest pain is described as pressure.  She states she has had some intermittent episodes of the same over the past few weeks but it is much worse today.  She also endorses associated shortness of breath.  She does not believe that she is fluid overloaded and does not take a diuretic at baseline.  She denies abdominal pain, nausea, vomiting, fever.  She does endorse a mild headache and believes this was secondary to taking 2 doses of sublingual nitroglycerin  earlier in the day.   Chest Pain      Home Medications Prior to Admission medications   Medication Sig Start Date End Date Taking? Authorizing Provider  acetaminophen  (TYLENOL ) 325 MG tablet Take 2 tablets (650 mg total) by mouth every 6 (six) hours. 11/14/18   Johnson, Clanford L, MD  Calcium  Carbonate-Vitamin D  (CALCIUM  + D PO) Take 2 tablets by mouth at bedtime.    [provider]  cholecalciferol  (VITAMIN D3) 25 MCG (1000 UT) tablet Take 1,000 Units by mouth daily after breakfast.    [provider]  clopidogrel  (PLAVIX ) 75 MG tablet Take 1 tablet by mouth once daily 03/03/23   Nahser, Lela Purple, MD  isosorbide  mononitrate (IMDUR ) 30 MG 24 hr tablet Take 1 tablet (30 mg total) by mouth daily. 07/23/23 10/21/23  Nahser, Lela Purple, MD  levothyroxine  (SYNTHROID , LEVOTHROID) 75 MCG tablet Take 75 mcg by mouth daily before breakfast.  11/30/17   [provider]   multivitamin-lutein (OCUVITE-LUTEIN) CAPS capsule Take 1 capsule by mouth 2 (two) times a day.     [provider]  nitroGLYCERIN  (NITROSTAT ) 0.4 MG SL tablet Place 1 tablet (0.4 mg total) under the tongue every 5 (five) minutes as needed. Patient taking differently: Place 0.4 mg under the tongue every 5 (five) minutes as needed for chest pain. 06/22/11   Nahser, Lela Purple, MD  rosuvastatin  (CRESTOR ) 40 MG tablet Take 1 tablet (40 mg total) by mouth daily. 01/18/23   Nahser, Lela Purple, MD  venlafaxine  XR (EFFEXOR -XR) 150 MG 24 hr capsule Take 1 capsule (150 mg total) by mouth daily with breakfast. 02/19/23   Nahser, Lela Purple, MD  vitamin B-12 (CYANOCOBALAMIN ) 1000 MCG tablet Take 2,000 mcg by mouth daily.     [provider]      Allergies    Patient has no known allergies.    Review of Systems   Review of Systems  Cardiovascular:  Positive for chest pain.    Physical Exam Updated Vital Signs BP 109/70 (BP Location: Right Arm)   Pulse (!) 52   Temp 98.4 F (36.9 C) (Oral)   Resp 15   Ht 5\' 3"  (1.6 m)   Wt 60 kg   SpO2 98%   BMI 23.43 kg/m  Physical Exam Vitals and nursing note reviewed.  Constitutional:      General: She is not in acute distress.    Appearance: She is well-developed.  HENT:     Head:  Normocephalic and atraumatic.  Eyes:     Conjunctiva/sclera: Conjunctivae normal.  Cardiovascular:     Rate and Rhythm: Normal rate and regular rhythm.  Pulmonary:     Effort: Pulmonary effort is normal. No respiratory distress.     Breath sounds: Normal breath sounds.  Chest:     Chest wall: No tenderness.  Abdominal:     Palpations: Abdomen is soft.     Tenderness: There is no abdominal tenderness.  Musculoskeletal:        General: No swelling.     Cervical back: Neck supple.     Right lower leg: No edema.     Left lower leg: No edema.  Skin:    General: Skin is warm and dry.     Capillary Refill: Capillary refill takes less than 2 seconds.   Neurological:     Mental Status: She is alert.  Psychiatric:        Mood and Affect: Mood normal.     ED Results / Procedures / Treatments   Labs (all labs ordered are listed, but only abnormal results are displayed) Labs Reviewed  BASIC METABOLIC PANEL WITH GFR - Abnormal; Notable for the following components:      Result Value   CO2 20 (*)    Glucose, Bld 104 (*)    GFR, Estimated 59 (*)    All other components within normal limits  BRAIN NATRIURETIC PEPTIDE - Abnormal; Notable for the following components:   B Natriuretic Peptide 859.6 (*)    All other components within normal limits  TROPONIN I (HIGH SENSITIVITY) - Abnormal; Notable for the following components:   Troponin I (High Sensitivity) 24 (*)    All other components within normal limits  CBC  TROPONIN I (HIGH SENSITIVITY)    EKG EKG Interpretation Date/Time:  Monday August 30 2023 22:46:08 EDT Ventricular Rate:  90 PR Interval:  155 QRS Duration:  132 QT Interval:  398 QTC Calculation: 487 R Axis:   -43  Text Interpretation: Sinus rhythm Left bundle branch block When compared with ECG of EARLIER SAME DATE Premature ventricular complexes are no longer present Confirmed by Alissa April ([id number]) on 08/30/2023 11:25:52 PM  Radiology DG Chest 2 View Result Date: 08/30/2023 CLINICAL DATA:  Chest pain and lightheadedness. EXAM: CHEST - 2 VIEW COMPARISON:  Portable chest 07/15/2022. FINDINGS: There is mild cardiomegaly with no evidence of CHF. Stable mediastinum with aortic atherosclerosis. Areas of linear atelectasis are noted in the lateral base of the lungs. There is no convincing focal infiltrate. The remaining lungs are clear. Wedging and kyphoplasty in 1 midthoracic segment again noted with osteopenia and degenerative change. IMPRESSION: No evidence of acute chest disease. Lateral basal areas of linear atelectasis. Mild cardiomegaly. Aortic atherosclerosis. Electronically Signed   By: Denman Fischer M.D.   On:  08/30/2023 21:57    Procedures Procedures  {Document cardiac monitor, telemetry assessment procedure when appropriate:1}  Medications Ordered in ED Medications  acetaminophen  (TYLENOL ) tablet 1,000 mg (1,000 mg Oral Given 08/30/23 2250)    ED Course/ Medical Decision Making/ A&P   {   Click here for ABCD2, HEART and other calculatorsREFRESH Note before signing :1}                              Medical Decision Making Amount and/or Complexity of Data Reviewed Labs: ordered. Radiology: ordered.   This patient presents to the ED for concern of chest pain, this  involves an extensive number of treatment options, and is a complaint that carries with it a high risk of complications and morbidity.  The differential diagnosis includes ACS, heart failure exacerbation,   Co morbidities that complicate the patient evaluation  ***   Additional history obtained:  Additional history obtained from *** External records from outside source obtained and reviewed including ***   Lab Tests:  I Ordered, and personally interpreted labs.  The pertinent results include:  ***   Imaging Studies ordered:  I ordered imaging studies including ***  I independently visualized and interpreted imaging which showed *** I agree with the radiologist interpretation   Cardiac Monitoring: / EKG:  The patient was maintained on a cardiac monitor.  I personally viewed and interpreted the cardiac monitored which showed an underlying rhythm of: ***   Problem List / ED Course / Critical interventions / Medication management  *** I ordered medication including ***  for ***  Reevaluation of the patient after these medicines showed that the patient {resolved/improved/worsened:23923::"improved"} I have reviewed the patients home medicines and have made adjustments as needed   Consultations Obtained:  I requested consultation with the ***,  and discussed lab and imaging findings as well as pertinent plan  - they recommend: ***   Social Determinants of Health:  ***   Test / Admission - Considered:  ***   {Document critical care time when appropriate:1} {Document review of labs and clinical decision tools ie heart score, Chads2Vasc2 etc:1}  {Document your independent review of radiology images, and any outside records:1} {Document your discussion with family members, caretakers, and with consultants:1} {Document social determinants of health affecting pt's care:1} {Document your decision making why or why not admission, treatments were needed:1} Final Clinical Impression(s) / ED Diagnoses Final diagnoses:  None    Rx / DC Orders ED Discharge Orders     None

## 2023-08-30 NOTE — ED Triage Notes (Signed)
 Pt arrived from home via POV c/o chest pain 5/10 that began when she woke up this morning described at pressure. Pt states that it has not let up, accompanied by sob

## 2023-08-30 NOTE — ED Provider Triage Note (Signed)
 Emergency Medicine Provider Triage Evaluation Note  Susan [name] , a 89 y.o. female  was evaluated in triage.  Pt complains of chest pain since waking up this AM at 730AM. Reports pain with deep breaths, not it "just hurts." Endorses SOB. Last fell 1+month ago. No recent surgeries.  Review of Systems  Positive: Chest pain Negative: N/V  Physical Exam  BP 109/70 (BP Location: Right Arm)   Pulse (!) 52   Temp 98.4 F (36.9 C) (Oral)   Resp 15   Ht 5\' 3"  (1.6 m)   Wt 60 kg   SpO2 98%   BMI 23.43 kg/m  Gen:   Awake, no distress   Resp:  Normal effort  MSK:   Moves extremities without difficulty  Other:  +chest wall ttp  Medical Decision Making  Medically screening exam initiated at 9:40 PM.  Appropriate orders placed.  [name] was informed that the remainder of the evaluation will be completed by another provider, this initial triage assessment does not replace that evaluation, and the importance of remaining in the ED until their evaluation is complete.     Timmy Forbes, [place] 08/30/23 2142

## 2023-08-30 NOTE — ED Notes (Signed)
 Patient transported to CT

## 2023-08-30 NOTE — ED Provider Notes (Signed)
 [place] [facility] Provider Note   CSN: [id number] Arrival date & time: 08/30/23  2120     History  Chief Complaint  Patient presents with   Chest Pain    Susan [name] is a 89 y.o. female.  Patient with past medical history significant for chronic systolic CHF, pituitary adenoma, anxiety, cardiomyopathy, CAD, previous heart cath with coronary angiography presents to the Emergency Department complaining of chest pain and shortness of breath.  Chest pain is described as pressure.  She states she has had some intermittent episodes of the same over the past few weeks but it is much worse today.  She also endorses associated shortness of breath.  She does not believe that she is fluid overloaded and does not take a diuretic at baseline.  She denies abdominal pain, nausea, vomiting, fever.  She does endorse a mild headache and believes this was secondary to taking 2 doses of sublingual nitroglycerin  earlier in the day.   Chest Pain      Home Medications Prior to Admission medications   Medication Sig Start Date End Date Taking? Authorizing Provider  acetaminophen  (TYLENOL ) 325 MG tablet Take 2 tablets (650 mg total) by mouth every 6 (six) hours. 11/14/18   Johnson, Clanford L, MD  Calcium  Carbonate-Vitamin D  (CALCIUM  + D PO) Take 2 tablets by mouth at bedtime.    [provider]  cholecalciferol  (VITAMIN D3) 25 MCG (1000 UT) tablet Take 1,000 Units by mouth daily after breakfast.    [provider]  clopidogrel  (PLAVIX ) 75 MG tablet Take 1 tablet by mouth once daily 03/03/23   Nahser, Lela Purple, MD  isosorbide  mononitrate (IMDUR ) 30 MG 24 hr tablet Take 1 tablet (30 mg total) by mouth daily. 07/23/23 10/21/23  Nahser, Lela Purple, MD  levothyroxine  (SYNTHROID , LEVOTHROID) 75 MCG tablet Take 75 mcg by mouth daily before breakfast.  11/30/17   [provider]  multivitamin-lutein (OCUVITE-LUTEIN) CAPS capsule Take 1 capsule by mouth  2 (two) times a day.     [provider]  nitroGLYCERIN  (NITROSTAT ) 0.4 MG SL tablet Place 1 tablet (0.4 mg total) under the tongue every 5 (five) minutes as needed. Patient taking differently: Place 0.4 mg under the tongue every 5 (five) minutes as needed for chest pain. 06/22/11   Nahser, Lela Purple, MD  rosuvastatin  (CRESTOR ) 40 MG tablet Take 1 tablet (40 mg total) by mouth daily. 01/18/23   Nahser, Lela Purple, MD  venlafaxine  XR (EFFEXOR -XR) 150 MG 24 hr capsule Take 1 capsule (150 mg total) by mouth daily with breakfast. 02/19/23   Nahser, Lela Purple, MD  vitamin B-12 (CYANOCOBALAMIN ) 1000 MCG tablet Take 2,000 mcg by mouth daily.     [provider]      Allergies    Patient has no known allergies.    Review of Systems   Review of Systems  Cardiovascular:  Positive for chest pain.    Physical Exam Updated Vital Signs BP 118/75 (BP Location: Right Arm)   Pulse 97   Temp 98.4 F (36.9 C) (Oral)   Resp (!) 24   Ht 5\' 3"  (1.6 m)   Wt 60 kg   SpO2 99%   BMI 23.43 kg/m  Physical Exam Vitals and nursing note reviewed.  Constitutional:      General: She is not in acute distress.    Appearance: She is well-developed.  HENT:     Head: Normocephalic and atraumatic.  Eyes:  Conjunctiva/sclera: Conjunctivae normal.  Cardiovascular:     Rate and Rhythm: Normal rate and regular rhythm.  Pulmonary:     Effort: Pulmonary effort is normal. No respiratory distress.     Breath sounds: Normal breath sounds.  Chest:     Chest wall: No tenderness.  Abdominal:     Palpations: Abdomen is soft.     Tenderness: There is no abdominal tenderness.  Musculoskeletal:        General: No swelling.     Cervical back: Neck supple.     Right lower leg: No edema.     Left lower leg: No edema.  Skin:    General: Skin is warm and dry.     Capillary Refill: Capillary refill takes less than 2 seconds.  Neurological:     Mental Status: She is alert.  Psychiatric:        Mood and  Affect: Mood normal.     ED Results / Procedures / Treatments   Labs (all labs ordered are listed, but only abnormal results are displayed) Labs Reviewed  BASIC METABOLIC PANEL WITH GFR - Abnormal; Notable for the following components:      Result Value   CO2 20 (*)    Glucose, Bld 104 (*)    GFR, Estimated 59 (*)    All other components within normal limits  BRAIN NATRIURETIC PEPTIDE - Abnormal; Notable for the following components:   B Natriuretic Peptide 859.6 (*)    All other components within normal limits  TROPONIN I (HIGH SENSITIVITY) - Abnormal; Notable for the following components:   Troponin I (High Sensitivity) 24 (*)    All other components within normal limits  TROPONIN I (HIGH SENSITIVITY) - Abnormal; Notable for the following components:   Troponin I (High Sensitivity) 25 (*)    All other components within normal limits  CBC    EKG EKG Interpretation Date/Time:  Monday August 30 2023 22:46:08 EDT Ventricular Rate:  90 PR Interval:  155 QRS Duration:  132 QT Interval:  398 QTC Calculation: 487 R Axis:   -43  Text Interpretation: Sinus rhythm Left bundle branch block When compared with ECG of EARLIER SAME DATE Premature ventricular complexes are no longer present Confirmed by Alissa April ([id number]) on 08/30/2023 11:25:52 PM  Radiology CT Angio Chest PE W/Cm &/Or Wo Cm Result Date: 08/30/2023 CLINICAL DATA:  Pulmonary embolism (PE) suspected, high prob, chest pain, lightheadedness EXAM: CT ANGIOGRAPHY CHEST WITH CONTRAST TECHNIQUE: Multidetector CT imaging of the chest was performed using the standard protocol during bolus administration of intravenous contrast. Multiplanar CT image reconstructions and MIPs were obtained to evaluate the vascular anatomy. RADIATION DOSE REDUCTION: This exam was performed according to the departmental dose-optimization program which includes automated exposure control, adjustment of the mA and/or kV according to patient size and/or use  of iterative reconstruction technique. CONTRAST:  75mL OMNIPAQUE  IOHEXOL  350 MG/ML SOLN COMPARISON:  03/06/2023, 08/30/2023 FINDINGS: Cardiovascular: This is a technically adequate evaluation of pulmonary vasculature. No filling defects or pulmonary emboli. Heart is enlarged without pericardial effusion. Prominent left ventricular dilatation. Calcifications of the mitral and aortic valves. Normal caliber of the thoracic aorta. Atherosclerosis of the aorta and coronary vasculature. Mediastinum/Nodes: No enlarged mediastinal, hilar, or axillary lymph nodes. Thyroid  gland, trachea, and esophagus demonstrate no significant findings. Lungs/Pleura: No acute airspace disease, effusion, or pneumothorax. Stable bibasilar scarring. Central airways are patent. 4 mm right middle lobe pulmonary nodule reference image 94/6. Upper Abdomen: No acute upper abdominal findings. Musculoskeletal: No acute  or destructive bony abnormalities. Reconstructed images demonstrate chronic T6 compression fracture and prior vertebral augmentation. Review of the MIP images confirms the above findings. IMPRESSION: 1. No evidence of pulmonary embolus. 2. Cardiomegaly, with prominent left ventricular dilatation. 3. Right solid pulmonary nodule measuring 4 mm. Per Fleischner Society Guidelines, no routine follow-up imaging is recommended. These guidelines do not apply to immunocompromised patients and patients with cancer. Follow up in patients with significant comorbidities as clinically warranted. For lung cancer screening, adhere to Lung-RADS guidelines. Reference: Radiology. 2017; 284(1):228-43. 4. Aortic Atherosclerosis (ICD10-I70.0). Coronary artery atherosclerosis. Electronically Signed   By: Bobbye Burrow M.D.   On: 08/30/2023 23:58   DG Chest 2 View Result Date: 08/30/2023 CLINICAL DATA:  Chest pain and lightheadedness. EXAM: CHEST - 2 VIEW COMPARISON:  Portable chest 07/15/2022. FINDINGS: There is mild cardiomegaly with no evidence of  CHF. Stable mediastinum with aortic atherosclerosis. Areas of linear atelectasis are noted in the lateral base of the lungs. There is no convincing focal infiltrate. The remaining lungs are clear. Wedging and kyphoplasty in 1 midthoracic segment again noted with osteopenia and degenerative change. IMPRESSION: No evidence of acute chest disease. Lateral basal areas of linear atelectasis. Mild cardiomegaly. Aortic atherosclerosis. Electronically Signed   By: Denman Fischer M.D.   On: 08/30/2023 21:57    Procedures Procedures    Medications Ordered in ED Medications  acetaminophen  (TYLENOL ) tablet 1,000 mg (1,000 mg Oral Given 08/30/23 2250)  iohexol  (OMNIPAQUE ) 350 MG/ML injection 75 mL (75 mLs Intravenous Contrast Given 08/30/23 2349)  furosemide (LASIX) injection 40 mg (40 mg Intravenous Given 08/31/23 0041)    ED Course/ Medical Decision Making/ A&P             HEART Score: 8                    Medical Decision Making Amount and/or Complexity of Data Reviewed Labs: ordered. Radiology: ordered.  Risk Prescription drug management. Decision regarding hospitalization.   This patient presents to the ED for concern of chest pain, this involves an extensive number of treatment options, and is a complaint that carries with it a high risk of complications and morbidity.  The differential diagnosis includes ACS, heart failure exacerbation,   Co morbidities that complicate the patient evaluation  CHF, CAD   Additional history obtained:  Additional history obtained from daughter at bedside External records from outside source obtained and reviewed including cardiology notes, previous BNP in the low 200 range   Lab Tests:  I Ordered, and personally interpreted labs.  The pertinent results include: BNP 859.6   Imaging Studies ordered:  I ordered imaging studies including chest x-ray CT angio chest PE study I independently visualized and interpreted imaging which showed  No  evidence of acute chest disease. Lateral basal areas of linear  atelectasis. Mild cardiomegaly. Aortic atherosclerosis.   1. No evidence of pulmonary embolus.  2. Cardiomegaly, with prominent left ventricular dilatation.  3. Right solid pulmonary nodule measuring 4 mm. Per Fleischner  Society Guidelines, no routine follow-up imaging is recommended.  These guidelines do not apply to immunocompromised patients and  patients with cancer. Follow up in patients with significant  comorbidities as clinically warranted. For lung cancer screening,  adhere to Lung-RADS guidelines. Reference: Radiology. 2017;  284(1):228-43.  4. Aortic Atherosclerosis (ICD10-I70.0). Coronary artery  atherosclerosis.   I agree with the radiologist interpretation   Cardiac Monitoring: / EKG:  The patient was maintained on a cardiac monitor.  I personally viewed  and interpreted the cardiac monitored which showed an underlying rhythm of: Sinus rhythm with left bundle branch block   Problem List / ED Course / Critical interventions / Medication management   I ordered medication including Lasix for diuresis, Tylenol  for headache Reevaluation of the patient after these medicines showed that the patient  had improvement in headache, continued to feel short of breath I have reviewed the patients home medicines and have made adjustments as needed   Consultations Obtained:  I requested consultation with the hospitalist,  Dr.Kakrakandy, and discussed lab and imaging findings as well as pertinent plan - they recommend: admission   Test / Admission - Considered:  Patient with BNP significantly elevated over baseline.  BNP baseline appears to be around 200.  BNP today is over 800.  Patient with mild oxygen desaturation during ambulation, down from 97% to 91%.  Significantly increased work of breathing with short ambulation.  Patient with negative troponins but left bundle branch block and heart score of 8.  Patient  continues to have some chest pressure.  She does endorse some relief with nitroglycerin  earlier in the day.  At this time I feel patient would benefit from admission for observation.         Final Clinical Impression(s) / ED Diagnoses Final diagnoses:  Chest pain, unspecified type  Shortness of breath  Elevated brain natriuretic peptide (BNP) level    Rx / DC Orders ED Discharge Orders     None         Delories Fetter 08/31/23 0203    Alissa April, MD 08/31/23 [phone]

## 2023-08-31 ENCOUNTER — Encounter: Payer: Self-pay | Admitting: Cardiovascular Disease

## 2023-08-31 ENCOUNTER — Encounter (HOSPITAL_COMMUNITY): Payer: Self-pay | Admitting: Internal Medicine

## 2023-08-31 DIAGNOSIS — I5042 Chronic combined systolic (congestive) and diastolic (congestive) heart failure: Secondary | ICD-10-CM

## 2023-08-31 DIAGNOSIS — I2510 Atherosclerotic heart disease of native coronary artery without angina pectoris: Secondary | ICD-10-CM | POA: Diagnosis not present

## 2023-08-31 DIAGNOSIS — I429 Cardiomyopathy, unspecified: Secondary | ICD-10-CM | POA: Diagnosis not present

## 2023-08-31 DIAGNOSIS — R072 Precordial pain: Secondary | ICD-10-CM | POA: Diagnosis not present

## 2023-08-31 DIAGNOSIS — E039 Hypothyroidism, unspecified: Secondary | ICD-10-CM

## 2023-08-31 DIAGNOSIS — F419 Anxiety disorder, unspecified: Secondary | ICD-10-CM

## 2023-08-31 DIAGNOSIS — R002 Palpitations: Secondary | ICD-10-CM

## 2023-08-31 DIAGNOSIS — I5022 Chronic systolic (congestive) heart failure: Secondary | ICD-10-CM | POA: Diagnosis not present

## 2023-08-31 DIAGNOSIS — R079 Chest pain, unspecified: Secondary | ICD-10-CM | POA: Diagnosis not present

## 2023-08-31 LAB — RENAL FUNCTION PANEL
Albumin: 2.7 g/dL — ABNORMAL LOW (ref 3.5–5.0)
Anion gap: 10 (ref 5–15)
BUN: 10 mg/dL (ref 8–23)
CO2: 18 mmol/L — ABNORMAL LOW (ref 22–32)
Calcium: 7.1 mg/dL — ABNORMAL LOW (ref 8.9–10.3)
Chloride: 112 mmol/L — ABNORMAL HIGH (ref 98–111)
Creatinine, Ser: 0.77 mg/dL (ref 0.44–1.00)
GFR, Estimated: >60 mL/min (ref >60)
Glucose, Bld: 77 mg/dL (ref 70–99)
Phosphorus: 2.2 mg/dL — ABNORMAL LOW (ref 2.5–4.6)
Potassium: 2.9 mmol/L — ABNORMAL LOW (ref 3.5–5.1)
Sodium: 140 mmol/L (ref 135–145)

## 2023-08-31 LAB — LIPID PANEL
Cholesterol: 122 mg/dL (ref 0–200)
HDL: 56 mg/dL (ref >40)
LDL Cholesterol: 52 mg/dL (ref 0–99)
Total CHOL/HDL Ratio: 2.2 ratio
Triglycerides: 71 mg/dL (ref <150)
VLDL: 14 mg/dL (ref 0–40)

## 2023-08-31 LAB — MAGNESIUM: Magnesium: 1.8 mg/dL (ref 1.7–2.4)

## 2023-08-31 LAB — TROPONIN I (HIGH SENSITIVITY)
Troponin I (High Sensitivity): 25 ng/L — ABNORMAL HIGH (ref <18)
Troponin I (High Sensitivity): 25 ng/L — ABNORMAL HIGH (ref <18)

## 2023-08-31 MED ORDER — ASPIRIN 81 MG PO CHEW
324.0000 mg | CHEWABLE_TABLET | Freq: Once | ORAL | Status: AC
  Administered 2023-08-31: 324 mg via ORAL
  Filled 2023-08-31: qty 4

## 2023-08-31 MED ORDER — OYSTER SHELL CALCIUM/D3 500-5 MG-MCG PO TABS
ORAL_TABLET | Freq: Every day | ORAL | Status: DC
  Administered 2023-08-31: 1 via ORAL
  Filled 2023-08-31 (×2): qty 1

## 2023-08-31 MED ORDER — MORPHINE SULFATE (PF) 4 MG/ML IV SOLN
4.0000 mg | Freq: Once | INTRAVENOUS | Status: DC
  Filled 2023-08-31: qty 1

## 2023-08-31 MED ORDER — MAGNESIUM OXIDE -MG SUPPLEMENT 400 (240 MG) MG PO TABS
400.0000 mg | ORAL_TABLET | Freq: Once | ORAL | Status: AC
  Administered 2023-08-31: 400 mg via ORAL
  Filled 2023-08-31: qty 1

## 2023-08-31 MED ORDER — HEPARIN SODIUM (PORCINE) 5000 UNIT/ML IJ SOLN
5000.0000 [IU] | Freq: Three times a day (TID) | INTRAMUSCULAR | Status: DC
  Administered 2023-08-31 – 2023-09-01 (×4): 5000 [IU] via SUBCUTANEOUS
  Filled 2023-08-31 (×4): qty 1

## 2023-08-31 MED ORDER — POTASSIUM CHLORIDE CRYS ER 20 MEQ PO TBCR
60.0000 meq | EXTENDED_RELEASE_TABLET | Freq: Once | ORAL | Status: AC
  Administered 2023-08-31: 60 meq via ORAL
  Filled 2023-08-31: qty 3

## 2023-08-31 MED ORDER — MORPHINE SULFATE (PF) 2 MG/ML IV SOLN
2.0000 mg | Freq: Once | INTRAVENOUS | Status: AC
  Administered 2023-08-31: 2 mg via INTRAVENOUS

## 2023-08-31 MED ORDER — POTASSIUM CHLORIDE CRYS ER 20 MEQ PO TBCR
40.0000 meq | EXTENDED_RELEASE_TABLET | Freq: Once | ORAL | Status: AC
  Administered 2023-08-31: 40 meq via ORAL
  Filled 2023-08-31: qty 2

## 2023-08-31 MED ORDER — NITROGLYCERIN 0.4 MG SL SUBL: 0.4000 mg | SUBLINGUAL_TABLET | SUBLINGUAL | Status: DC | PRN

## 2023-08-31 MED ORDER — ROSUVASTATIN CALCIUM 20 MG PO TABS
40.0000 mg | ORAL_TABLET | Freq: Every day | ORAL | Status: DC
  Administered 2023-08-31 – 2023-09-01 (×2): 40 mg via ORAL
  Filled 2023-08-31 (×2): qty 2

## 2023-08-31 MED ORDER — VENLAFAXINE HCL ER 75 MG PO CP24
150.0000 mg | ORAL_CAPSULE | Freq: Every day | ORAL | Status: DC
  Administered 2023-08-31 – 2023-09-01 (×2): 150 mg via ORAL
  Filled 2023-08-31 (×2): qty 2

## 2023-08-31 MED ORDER — FUROSEMIDE 10 MG/ML IJ SOLN
40.0000 mg | Freq: Once | INTRAMUSCULAR | Status: AC
  Administered 2023-08-31: 40 mg via INTRAVENOUS
  Filled 2023-08-31: qty 4

## 2023-08-31 MED ORDER — ADULT MULTIVITAMIN W/MINERALS CH
1.0000 | ORAL_TABLET | Freq: Every day | ORAL | Status: DC
  Administered 2023-08-31 – 2023-09-01 (×2): 1 via ORAL
  Filled 2023-08-31 (×2): qty 1

## 2023-08-31 MED ORDER — PANTOPRAZOLE SODIUM 40 MG PO TBEC
40.0000 mg | DELAYED_RELEASE_TABLET | Freq: Every day | ORAL | Status: DC
  Administered 2023-08-31 – 2023-09-01 (×2): 40 mg via ORAL
  Filled 2023-08-31 (×2): qty 1

## 2023-08-31 MED ORDER — METOPROLOL SUCCINATE ER 25 MG PO TB24
12.5000 mg | ORAL_TABLET | Freq: Every day | ORAL | Status: DC
  Administered 2023-08-31: 12.5 mg via ORAL
  Filled 2023-08-31 (×2): qty 1

## 2023-08-31 MED ORDER — ONDANSETRON HCL 4 MG/2ML IJ SOLN: 4.0000 mg | Freq: Four times a day (QID) | INTRAMUSCULAR | Status: DC | PRN

## 2023-08-31 MED ORDER — VITAMIN D 25 MCG (1000 UNIT) PO TABS
1000.0000 [IU] | ORAL_TABLET | Freq: Every day | ORAL | Status: DC
  Administered 2023-08-31 – 2023-09-01 (×2): 1000 [IU] via ORAL
  Filled 2023-08-31 (×2): qty 1

## 2023-08-31 MED ORDER — RANOLAZINE ER 500 MG PO TB12
500.0000 mg | ORAL_TABLET | Freq: Two times a day (BID) | ORAL | Status: DC
  Administered 2023-08-31 – 2023-09-01 (×3): 500 mg via ORAL
  Filled 2023-08-31 (×3): qty 1

## 2023-08-31 MED ORDER — ACETAMINOPHEN 325 MG PO TABS
650.0000 mg | ORAL_TABLET | ORAL | Status: DC | PRN
  Administered 2023-08-31: 650 mg via ORAL
  Filled 2023-08-31 (×2): qty 2

## 2023-08-31 MED ORDER — LIDOCAINE 5 % EX PTCH
1.0000 | MEDICATED_PATCH | CUTANEOUS | Status: DC
  Administered 2023-08-31: 1 via TRANSDERMAL
  Filled 2023-08-31: qty 1

## 2023-08-31 MED ORDER — CLOPIDOGREL BISULFATE 75 MG PO TABS
75.0000 mg | ORAL_TABLET | Freq: Every day | ORAL | Status: DC
  Administered 2023-08-31 – 2023-09-01 (×2): 75 mg via ORAL
  Filled 2023-08-31 (×2): qty 1

## 2023-08-31 MED ORDER — ISOSORBIDE MONONITRATE ER 30 MG PO TB24
30.0000 mg | ORAL_TABLET | Freq: Every day | ORAL | Status: DC
  Administered 2023-08-31: 30 mg via ORAL
  Filled 2023-08-31: qty 1

## 2023-08-31 MED ORDER — LEVOTHYROXINE SODIUM 75 MCG PO TABS
75.0000 ug | ORAL_TABLET | Freq: Every day | ORAL | Status: DC
  Administered 2023-08-31 – 2023-09-01 (×2): 75 ug via ORAL
  Filled 2023-08-31 (×2): qty 1

## 2023-08-31 MED ORDER — VITAMIN B-12 1000 MCG PO TABS
2000.0000 ug | ORAL_TABLET | Freq: Every day | ORAL | Status: DC
  Administered 2023-08-31 – 2023-09-01 (×2): 2000 ug via ORAL
  Filled 2023-08-31 (×2): qty 2

## 2023-08-31 NOTE — ED Notes (Signed)
 Patient transported back from CT

## 2023-08-31 NOTE — ED Notes (Signed)
 Patient's daughter, Christiane Cowing [phone]), updated as to patient's status.

## 2023-08-31 NOTE — ED Notes (Signed)
 Patient ambulated with pulse ox around the unit. Patient initially desated from 97 to 91% but as she continued walking she was able to recover without oxygen. Patient became winded and when returning to the room, got in bed, and went to sleep while talking with the RN and Tech.

## 2023-08-31 NOTE — TOC CM/SW Note (Signed)
 Transition of Care [facility]) - Inpatient Brief Assessment   Patient Details  Name: Susan [name] MRN: [id number] Date of Birth: 1933/08/04  [facility]) CM/SW Contact:    Jennett Model, RN Phone Number: 08/31/2023, 4:48 PM   Clinical Narrative: From home alone, indep, has PCP and insurance on file, states has no HH services in place at this time or DME at home.  States  a friend will transport them home at [employer] and family is support system, states gets medications from [facility]  on [address].  Pta self ambulatory.  Patient gives this NCM permission to speak with Ladean Picket and Vallorie Gayer.     Transition of Care Asessment: Insurance and Status: Insurance coverage has been reviewed Patient has primary care physician: Yes ([facility]) Home environment has been reviewed: lives alone Prior level of function:: indep Prior/Current Home Services: No current home services Social Drivers of Health Review: SDOH reviewed no interventions necessary Readmission risk has been reviewed: Yes Transition of care needs: no transition of care needs at this time

## 2023-08-31 NOTE — Care Management Obs Status (Signed)
 MEDICARE OBSERVATION STATUS NOTIFICATION   Patient Details  Name: KAETLIN LASHER MRN: [id number] Date of Birth: 1933-06-24   Medicare Observation Status Notification Given:  Yes    Jennett Model, RN 08/31/2023, 4:46 PM

## 2023-08-31 NOTE — H&P (Signed)
 [facility] H&P   Patient Demographics:    Susan [name], is a 89 y.o. female  MRN: [id number]   DOB - [birth year]  Admit Date - 08/30/2023  Outpatient Primary MD for the patient is Aldo Hun, MD  Referring MD/NP/PA: Randolm Butte  Outpatient Specialists: cardiology Dr Floria Hurst    Patient coming from: home  Chief Complaint  Patient presents with   Chest Pain      HPI:    Susan [name]  is a 89 y.o. female, with past medical history of with a hx of CAD s/p PTCA/stenting of mid and distal RCA 2005 with residual moderate proximal LAD stenosis, prior near-syncope and syncope felt vasovagal, LBBB, chronic HFrEF NYHA II, known frequent PVCs, carotid artery disease (1-39% BICA 2019), pituitary adenoma, 3.2cm dilation of thoracic aortic arch by CT 2021 (normal by echo 10/2021), mild-moderate AI and moderate MR by echo 2023, hypothyroidism and HLD . - Patient has been seen by her primary cardiologist Dr. Sherill Ding multiple times recently due to complaints of dyspnea, multiple episodes of chest pain, where she had nuclear stress test 07/30/2023, which was high risk, findings are consistent with no ischemia, with no evidence of infarction or ischemia, small apical defect, 2D echo/08/2023 significant for drop in EF 25 to 30% with global hypokinesis. - Patient presents with complaints of chest pain, reports this is different than previous episodes, this 1 felt to be midsternal, pressure-like, happening at rest, less improvement with her nitro, previous ones were preceded with lightheadedness potation, so she presented to ED for further evaluation. - In ED her EKG was significant for known left bundle branch block, her troponins were borderline 24> 25<CTA chest with no evidence of PE, BNP elevated at 859, she is currently chest pain-free after receiving morphine  and nitro, [facility] consulted to admit    Review of systems:      A full 10 point Review of Systems was done, except as stated above, all other Review of Systems were negative.   With Past History of the following :    Past Medical History:  Diagnosis Date   Chronic HFrEF (heart failure with reduced ejection fraction) (HCC)    Coronary artery disease    PTCA/stenting of RCA 2005, moderate residual 70% stenosis of LAD stable by last cath 2021   Frequent PVCs    Hyperlipidemia    Hypothyroidism    LBBB (left bundle branch block)    Mild carotid artery disease (HCC)    Osteoporosis    Syncope       Past Surgical History:  Procedure Laterality Date   APPENDECTOMY  1970s?   "ruptured"    BACK SURGERY     CATARACT EXTRACTION W/ INTRAOCULAR LENS  IMPLANT, BILATERAL Bilateral    CORONARY ANGIOPLASTY WITH STENT PLACEMENT  ~ 2009   "2 stents"   FRACTURE SURGERY     IR VERTEBROPLASTY CERV/THOR BX  INC UNI/BIL INC/INJECT/IMAGING  02/07/2018   KNEE ARTHROSCOPY Right     torn meniscus   LEFT HEART CATH AND CORONARY ANGIOGRAPHY N/A 03/21/2020   Procedure: LEFT HEART CATH AND CORONARY ANGIOGRAPHY;  Surgeon: Avanell Leigh, MD;  Location: [facility];  Service: Cardiovascular;  Laterality: N/A;   LUMBAR LAMINECTOMY  1978   OVARIAN CYST SURGERY  1970s?   "ruptured"    RIGHT/LEFT HEART CATH AND CORONARY ANGIOGRAPHY N/A 10/08/2017   Procedure: RIGHT/LEFT HEART CATH AND CORONARY ANGIOGRAPHY;  Surgeon: Sammy Crisp, MD;  Location: [facility];  Service: Cardiovascular;  Laterality: N/A;   WRIST FRACTURE SURGERY Bilateral    "2 surgeries for 2 breaks on left side; 1 surgery for 1 break on right wrist"      Social History:     Social History   Tobacco Use   Smoking status: Never   Smokeless tobacco: Never  Substance Use Topics   Alcohol use: Yes    Alcohol/week: 1.0 standard drink of alcohol    Types: 1 Glasses of wine per week        Family History :     Family History  Problem Relation Age of  Onset   Alzheimer's disease Mother    Heart attack Father    Aneurysm Sister       Home Medications:   Prior to Admission medications   Medication Sig Start Date End Date Taking? Authorizing Provider  acetaminophen  (TYLENOL ) 325 MG tablet Take 2 tablets (650 mg total) by mouth every 6 (six) hours. Patient taking differently: Take 650 mg by mouth every 6 (six) hours as needed for mild pain (pain score 1-3). 11/14/18  Yes Johnson, Clanford L, MD  Calcium  Carbonate-Vitamin D  (CALCIUM  + D PO) Take 2 tablets by mouth at bedtime.   Yes [provider]  cholecalciferol  (VITAMIN D3) 25 MCG (1000 UT) tablet Take 1,000 Units by mouth daily after breakfast.   Yes [provider]  clopidogrel  (PLAVIX ) 75 MG tablet Take 1 tablet by mouth once daily 03/03/23  Yes Nahser, Lela Purple, MD  denosumab  (PROLIA ) 60 MG/ML SOSY injection Inject 60 mg into the skin every 6 (six) months.   Yes [provider]  isosorbide  mononitrate (IMDUR ) 30 MG 24 hr tablet Take 1 tablet (30 mg total) by mouth daily. Patient taking differently: Take 15 mg by mouth daily. 07/23/23 10/21/23 Yes Nahser, Lela Purple, MD  levothyroxine  (SYNTHROID , LEVOTHROID) 75 MCG tablet Take 75 mcg by mouth daily before breakfast.  11/30/17  Yes [provider]  multivitamin-lutein (OCUVITE-LUTEIN) CAPS capsule Take 1 capsule by mouth 2 (two) times a day.    Yes [provider]  nitroGLYCERIN  (NITROSTAT ) 0.4 MG SL tablet Place 1 tablet (0.4 mg total) under the tongue every 5 (five) minutes as needed. Patient taking differently: Place 0.4 mg under the tongue every 5 (five) minutes as needed for chest pain. 06/22/11  Yes Nahser, Lela Purple, MD  rosuvastatin  (CRESTOR ) 40 MG tablet Take 1 tablet (40 mg total) by mouth daily. 01/18/23  Yes Nahser, Lela Purple, MD  venlafaxine  XR (EFFEXOR -XR) 150 MG 24 hr capsule Take 1 capsule (150 mg total) by mouth daily with breakfast. 02/19/23  Yes Nahser, Lela Purple, MD  vitamin B-12  (CYANOCOBALAMIN ) 1000 MCG tablet Take 2,000 mcg by mouth daily.    Yes [provider]     Allergies:    No Known Allergies   Physical Exam:   Vitals  Blood pressure 118/75, pulse  97, temperature 98.4 F (36.9 C), temperature source Oral, resp. rate (!) 24, height 5\' 3"  (1.6 m), weight 60 kg, SpO2 99%.   1. General frail elderly femalle, lying in bed in NAD,    2. Normal affect and insight, Not Suicidal or Homicidal, Awake Alert, Oriented X 3.  3. No F.N deficits, ALL C.Nerves Intact, Strength 5/5 all 4 extremities, Sensation intact all 4 extremities, Plantars down going.  4. Ears and Eyes appear Normal, Conjunctivae clear, PERRLA. Moist Oral Mucosa.  5. Supple Neck, No JVD, No cervical lymphadenopathy appriciated, No Carotid Bruits.  6. Symmetrical Chest wall movement, Good air movement bilaterally, CTAB.  7. RRR, No Gallops, Rubs or Murmurs, No Parasternal Heave.  8. Positive Bowel Sounds, Abdomen Soft, No tenderness, No organomegaly appriciated,No rebound -guarding or rigidity.  9.  No Cyanosis, Normal Skin Turgor, No Skin Rash or Bruise.  10. Good muscle tone,  joints appear normal , no effusions, Normal ROM.     Data Review:    CBC Recent Labs  Lab 08/30/23 2138  WBC 9.5  HGB 12.8  HCT 38.3  PLT 205  MCV 98.2  MCH 32.8  MCHC 33.4  RDW 14.6   ------------------------------------------------------------------------------------------------------------------  Chemistries  Recent Labs  Lab 08/30/23 2138  NA 137  K 3.9  CL 105  CO2 20*  GLUCOSE 104*  BUN 9  CREATININE 0.93  CALCIUM  9.5   ------------------------------------------------------------------------------------------------------------------ estimated creatinine clearance is 35.3 mL/min (by C-G formula based on SCr of 0.93 mg/dL). ------------------------------------------------------------------------------------------------------------------ No results for input(s): "TSH",  "T4TOTAL", "T3FREE", "THYROIDAB" in the last 72 hours.  Invalid input(s): "FREET3"  Coagulation profile No results for input(s): "INR", "PROTIME" in the last 168 hours. ------------------------------------------------------------------------------------------------------------------- No results for input(s): "DDIMER" in the last 72 hours. -------------------------------------------------------------------------------------------------------------------  Cardiac Enzymes No results for input(s): "CKMB", "TROPONINI", "MYOGLOBIN" in the last 168 hours.  Invalid input(s): "CK" ------------------------------------------------------------------------------------------------------------------    Component Value Date/Time   BNP 859.6 (H) 08/30/2023 2137     ---------------------------------------------------------------------------------------------------------------  Urinalysis    Component Value Date/Time   COLORURINE STRAW (A) 03/06/2023 1420   APPEARANCEUR CLEAR 03/06/2023 1420   LABSPEC 1.018 03/06/2023 1420   PHURINE 7.0 03/06/2023 1420   GLUCOSEU NEGATIVE 03/06/2023 1420   HGBUR NEGATIVE 03/06/2023 1420   BILIRUBINUR NEGATIVE 03/06/2023 1420   KETONESUR 5 (A) 03/06/2023 1420   PROTEINUR NEGATIVE 03/06/2023 1420   UROBILINOGEN 0.2 07/13/2011 2036   NITRITE NEGATIVE 03/06/2023 1420   LEUKOCYTESUR NEGATIVE 03/06/2023 1420    ----------------------------------------------------------------------------------------------------------------   Imaging Results:    CT Angio Chest PE W/Cm &/Or Wo Cm Result Date: 08/30/2023 CLINICAL DATA:  Pulmonary embolism (PE) suspected, high prob, chest pain, lightheadedness EXAM: CT ANGIOGRAPHY CHEST WITH CONTRAST TECHNIQUE: Multidetector CT imaging of the chest was performed using the standard protocol during bolus administration of intravenous contrast. Multiplanar CT image reconstructions and MIPs were obtained to evaluate the vascular anatomy.  RADIATION DOSE REDUCTION: This exam was performed according to the departmental dose-optimization program which includes automated exposure control, adjustment of the mA and/or kV according to patient size and/or use of iterative reconstruction technique. CONTRAST:  75mL OMNIPAQUE  IOHEXOL  350 MG/ML SOLN COMPARISON:  03/06/2023, 08/30/2023 FINDINGS: Cardiovascular: This is a technically adequate evaluation of pulmonary vasculature. No filling defects or pulmonary emboli. Heart is enlarged without pericardial effusion. Prominent left ventricular dilatation. Calcifications of the mitral and aortic valves. Normal caliber of the thoracic aorta. Atherosclerosis of the aorta and coronary vasculature. Mediastinum/Nodes: No enlarged mediastinal, hilar, or axillary lymph nodes. Thyroid  gland,  trachea, and esophagus demonstrate no significant findings. Lungs/Pleura: No acute airspace disease, effusion, or pneumothorax. Stable bibasilar scarring. Central airways are patent. 4 mm right middle lobe pulmonary nodule reference image 94/6. Upper Abdomen: No acute upper abdominal findings. Musculoskeletal: No acute or destructive bony abnormalities. Reconstructed images demonstrate chronic T6 compression fracture and prior vertebral augmentation. Review of the MIP images confirms the above findings. IMPRESSION: 1. No evidence of pulmonary embolus. 2. Cardiomegaly, with prominent left ventricular dilatation. 3. Right solid pulmonary nodule measuring 4 mm. Per Fleischner Society Guidelines, no routine follow-up imaging is recommended. These guidelines do not apply to immunocompromised patients and patients with cancer. Follow up in patients with significant comorbidities as clinically warranted. For lung cancer screening, adhere to Lung-RADS guidelines. Reference: Radiology. 2017; 284(1):228-43. 4. Aortic Atherosclerosis (ICD10-I70.0). Coronary artery atherosclerosis. Electronically Signed   By: Bobbye Burrow M.D.   On: 08/30/2023  23:58   DG Chest 2 View Result Date: 08/30/2023 CLINICAL DATA:  Chest pain and lightheadedness. EXAM: CHEST - 2 VIEW COMPARISON:  Portable chest 07/15/2022. FINDINGS: There is mild cardiomegaly with no evidence of CHF. Stable mediastinum with aortic atherosclerosis. Areas of linear atelectasis are noted in the lateral base of the lungs. There is no convincing focal infiltrate. The remaining lungs are clear. Wedging and kyphoplasty in 1 midthoracic segment again noted with osteopenia and degenerative change. IMPRESSION: No evidence of acute chest disease. Lateral basal areas of linear atelectasis. Mild cardiomegaly. Aortic atherosclerosis. Electronically Signed   By: Denman Fischer M.D.   On: 08/30/2023 21:57    EKG:  Vent. rate 90 BPM PR interval 155 ms QRS duration 132 ms QT/QTcB 398/487 ms P-R-T axes 41 -43 102 Sinus rhythm Left bundle branch block When compared with ECG of EARLIER SAME DATE Premature ventricular complexes are no longer present   Assessment & Plan:    Active Problems:   Arteriosclerosis of coronary artery   Hypothyroidism   Anxiety   Cardiomyopathy (HCC)   Chronic systolic CHF (congestive heart failure) (HCC)   Chest pain  Chest pain - Patient has some significant risk factors, agitation concerning for cardiac chest pain, so she will be admitted for further workup - She is with significant coronary artery disease, hypertension, hyperlipidemia, carotid artery disease, chronic systolic CHF. - Multiple recent presentations to her primary cardiologist with chest pain, she had Myoview  stress test done within 1 month on 07/30/2023, high risk, without any evidence of ischemia or infarction, but she had some apical septal defect. - As well her 2D echo showing significant drop in her EF 25 to 30% with global hypokinesis. - Will check lipid panel in a.m.Aaron Aas - Will give 4 chewable aspirin .-Continue with home dose Plavix  - Continue with increased dose of Imdur , statin, will  request cardiology consult regarding further recommendations, has unclear if need to add Ranexa or uptitrate her Imdur , or further workup is indicated at this point, will defer to cardiology  History of CAD - Please see above discussion  Chronic systolic CHF - Recent echo showing EF 25 to 30% with global hypokinesis-currently her blood pressure is soft in ED, will defer GDMT to cardiology - BNP is mildly elevated, but she does not appear to be in overtly volume overload status, so I will hold on diuresis especially with soft blood pressure  Carotid artery disease - Continue with statin and Plavix   Hyperlipidemia - Continue with statin, will check lipid panel  Depression Continue with home Effexor   Incidental pulmonary nodule on imaging -4 mm, no  further workup is recommended       DVT Prophylaxis Placer Heparin    AM Labs Ordered, also please review Full Orders  Family Communication: Admission, patients condition and plan of care including tests being ordered have been discussed with the patient  who indicate understanding and agree with the plan and Code Status.  Code Status Full  Likely DC to  home  Consults called: sent message to cardiology    Admission status: observation    Time spent in minutes : 70 minutes   Seena Dadds M.D on 08/31/2023 at 2:26 AM   [facility] - Office  [phone]

## 2023-08-31 NOTE — Progress Notes (Signed)
 PROGRESS NOTE  Susan [name] ZOX:[id number] DOB: January 21, 1934   PCP: Aldo Hun, MD  Patient is from: Home.  Lives alone independently.  DOA: 08/30/2023 LOS: 0  Chief complaints Chief Complaint  Patient presents with   Chest Pain     Brief Narrative / Interim history: 89 year old F with PMH of CAD s/p RCA stenting 2005 with residual moderate proximal LAD stenosis, LBBB, HFrEF (NYHA II), frequent PVCs and recurrent chest pain associated with near syncope and diaphoresis presenting with unusual substernal pressure-like chest pain that was worse with deep breathing.  Recent nuclear stress test on 3/28 was high risk, with no evidence of infarction or ischemia but a small apical defect.  Recent TTE on 4/4 showed significant drop in EF to 25 to 30% with GH.  In ED, patient was chest pain-free after IV morphine  and nitro.  EKG showed chronic LBBB.  Troponin 24, 25 and 25.  CT angio chest negative for PE.  BNP elevated to 860.  Admission requested for chest pain workup.  Cardiology consulted.  Subjective: Seen and examined earlier this morning.  No major events overnight of this morning.  No further chest pain since last night.  Denies shortness of breath.  Denies orthopnea or edema.  However, she reports exertional chest pain at baseline.  Daughter at bedside.  Denies fever, URI, GI or UTI symptoms.  Objective: Vitals:   08/31/23 0600 08/31/23 0700 08/31/23 0800 08/31/23 0842  BP: 97/60 104/66 104/76   Pulse:   82   Resp: 16 19 15    Temp:    97.8 F (36.6 C)  TempSrc:    Axillary  SpO2: 95% 98% 98%   Weight:      Height:        Examination:  GENERAL: No apparent distress.  Nontoxic. HEENT: MMM.  Vision and hearing grossly intact.  NECK: Supple.  No apparent JVD.  RESP:  No IWOB.  Fair aeration bilaterally. CVS:  RRR. Heart sounds normal.  ABD/GI/GU: BS+. Abd soft, NTND.  MSK/EXT:  Moves extremities. No apparent deformity. No edema.  SKIN: no apparent skin lesion or  wound NEURO: Awake, alert and oriented appropriately.  No apparent focal neuro deficit. PSYCH: Calm. Normal affect.   Consultants:  Cardiology  Procedures: None  Microbiology summarized: None  Assessment and plan: Substernal chest pain: Different from her usual chest pain which was associated with near syncope and diaphoresis.  Recently evaluated by cardiology with nuclear stress test and TTE as above.  She is currently chest pain-free.  Her usual chest pain is concerning for stable angina.  LHC in 2021 showed patent RCA stent and LAD lesions with 70% stenosis felt to be unchanged since 2019.  Mildly elevated serial troponin without significant delta.  EKG with chronic LBBB.  CT angio chest without significant finding.  She is currently chest pain-free.  Mild tenderness over LLSB.  LDL 52. -Follow cardiology recommendations -Continue home Plavix , Imdur , Crestor    Chronic systolic CHF: TTE on 4/4 with LVEF of 25 to 30%, GH.  BNP elevated to 860 but appears euvolemic on exam.  No orthopnea, PND or edema.  Not on diuretics at home. -Continue home Imdur  -GDMT per cardiology  History of CAD s/p RCA stents in 2005 - Management as above   Recurrent near syncope: This comes along with chest pain.  Long-term monitor with HR ranging from 66-207, some bundle branch block/IVCD, 2 VT runs, SVTs -Cardiology on board.   Hyperlipidemia: LDL 52. -Continue Crestor .   Depression:  Stable -Continue with home Effexor    Incidental pulmonary: CT angio showed 4 mm incidental nodule -No further workup is recommended   Body mass index is 23.43 kg/m.           DVT prophylaxis:  heparin  injection 5,000 Units Start: 08/31/23 0600  Code Status: Full code Family Communication: Updated patient's daughter at bedside Level of care: Telemetry Cardiac Status is: Observation The patient will require care spanning > 2 midnights and should be moved to inpatient because: Chest pain evaluation   Final  disposition: Likely home once cleared by cardiology   55 minutes with more than 50% spent in reviewing records, counseling patient/family and coordinating care.   Sch Meds:  Scheduled Meds:  calcium -vitamin D    Oral QHS   cholecalciferol   1,000 Units Oral QPC breakfast   clopidogrel   75 mg Oral Daily   cyanocobalamin   2,000 mcg Oral Daily   heparin   5,000 Units Subcutaneous Q8H   isosorbide  mononitrate  30 mg Oral Daily   levothyroxine   75 mcg Oral QAC breakfast   multivitamin with minerals  1 tablet Oral Daily   pantoprazole   40 mg Oral Daily   rosuvastatin   40 mg Oral Daily   venlafaxine  XR  150 mg Oral Q breakfast   Continuous Infusions: PRN Meds:.acetaminophen , nitroGLYCERIN , ondansetron  (ZOFRAN ) IV  Antimicrobials: Anti-infectives (From admission, onward)    None        I have personally reviewed the following labs and images: CBC: Recent Labs  Lab 08/30/23 2138  WBC 9.5  HGB 12.8  HCT 38.3  MCV 98.2  PLT 205   BMP &GFR Recent Labs  Lab 08/30/23 2138  NA 137  K 3.9  CL 105  CO2 20*  GLUCOSE 104*  BUN 9  CREATININE 0.93  CALCIUM  9.5   Estimated Creatinine Clearance: 35.3 mL/min (by C-G formula based on SCr of 0.93 mg/dL). Liver & Pancreas: No results for input(s): "AST", "ALT", "ALKPHOS", "BILITOT", "PROT", "ALBUMIN" in the last 168 hours. No results for input(s): "LIPASE", "AMYLASE" in the last 168 hours. No results for input(s): "AMMONIA" in the last 168 hours. Diabetic: No results for input(s): "HGBA1C" in the last 72 hours. No results for input(s): "GLUCAP" in the last 168 hours. Cardiac Enzymes: No results for input(s): "CKTOTAL", "CKMB", "CKMBINDEX", "TROPONINI" in the last 168 hours. No results for input(s): "PROBNP" in the last 8760 hours. Coagulation Profile: No results for input(s): "INR", "PROTIME" in the last 168 hours. Thyroid  Function Tests: No results for input(s): "TSH", "T4TOTAL", "FREET4", "T3FREE", "THYROIDAB" in the last 72  hours. Lipid Profile: Recent Labs    08/31/23 0443  CHOL 122  HDL 56  LDLCALC 52  TRIG 71  CHOLHDL 2.2   Anemia Panel: No results for input(s): "VITAMINB12", "FOLATE", "FERRITIN", "TIBC", "IRON", "RETICCTPCT" in the last 72 hours. Urine analysis:    Component Value Date/Time   COLORURINE STRAW (A) 03/06/2023 1420   APPEARANCEUR CLEAR 03/06/2023 1420   LABSPEC 1.018 03/06/2023 1420   PHURINE 7.0 03/06/2023 1420   GLUCOSEU NEGATIVE 03/06/2023 1420   HGBUR NEGATIVE 03/06/2023 1420   BILIRUBINUR NEGATIVE 03/06/2023 1420   KETONESUR 5 (A) 03/06/2023 1420   PROTEINUR NEGATIVE 03/06/2023 1420   UROBILINOGEN 0.2 07/13/2011 2036   NITRITE NEGATIVE 03/06/2023 1420   LEUKOCYTESUR NEGATIVE 03/06/2023 1420   Sepsis Labs: Invalid input(s): "PROCALCITONIN", "LACTICIDVEN"  Microbiology: No results found for this or any previous visit (from the past 240 hours).  Radiology Studies: CT Angio Chest PE W/Cm &/Or Wo  Cm Result Date: 08/30/2023 CLINICAL DATA:  Pulmonary embolism (PE) suspected, high prob, chest pain, lightheadedness EXAM: CT ANGIOGRAPHY CHEST WITH CONTRAST TECHNIQUE: Multidetector CT imaging of the chest was performed using the standard protocol during bolus administration of intravenous contrast. Multiplanar CT image reconstructions and MIPs were obtained to evaluate the vascular anatomy. RADIATION DOSE REDUCTION: This exam was performed according to the departmental dose-optimization program which includes automated exposure control, adjustment of the mA and/or kV according to patient size and/or use of iterative reconstruction technique. CONTRAST:  75mL OMNIPAQUE  IOHEXOL  350 MG/ML SOLN COMPARISON:  03/06/2023, 08/30/2023 FINDINGS: Cardiovascular: This is a technically adequate evaluation of pulmonary vasculature. No filling defects or pulmonary emboli. Heart is enlarged without pericardial effusion. Prominent left ventricular dilatation. Calcifications of the mitral and aortic  valves. Normal caliber of the thoracic aorta. Atherosclerosis of the aorta and coronary vasculature. Mediastinum/Nodes: No enlarged mediastinal, hilar, or axillary lymph nodes. Thyroid  gland, trachea, and esophagus demonstrate no significant findings. Lungs/Pleura: No acute airspace disease, effusion, or pneumothorax. Stable bibasilar scarring. Central airways are patent. 4 mm right middle lobe pulmonary nodule reference image 94/6. Upper Abdomen: No acute upper abdominal findings. Musculoskeletal: No acute or destructive bony abnormalities. Reconstructed images demonstrate chronic T6 compression fracture and prior vertebral augmentation. Review of the MIP images confirms the above findings. IMPRESSION: 1. No evidence of pulmonary embolus. 2. Cardiomegaly, with prominent left ventricular dilatation. 3. Right solid pulmonary nodule measuring 4 mm. Per Fleischner Society Guidelines, no routine follow-up imaging is recommended. These guidelines do not apply to immunocompromised patients and patients with cancer. Follow up in patients with significant comorbidities as clinically warranted. For lung cancer screening, adhere to Lung-RADS guidelines. Reference: Radiology. 2017; 284(1):228-43. 4. Aortic Atherosclerosis (ICD10-I70.0). Coronary artery atherosclerosis. Electronically Signed   By: Bobbye Burrow M.D.   On: 08/30/2023 23:58   DG Chest 2 View Result Date: 08/30/2023 CLINICAL DATA:  Chest pain and lightheadedness. EXAM: CHEST - 2 VIEW COMPARISON:  Portable chest 07/15/2022. FINDINGS: There is mild cardiomegaly with no evidence of CHF. Stable mediastinum with aortic atherosclerosis. Areas of linear atelectasis are noted in the lateral base of the lungs. There is no convincing focal infiltrate. The remaining lungs are clear. Wedging and kyphoplasty in 1 midthoracic segment again noted with osteopenia and degenerative change. IMPRESSION: No evidence of acute chest disease. Lateral basal areas of linear  atelectasis. Mild cardiomegaly. Aortic atherosclerosis. Electronically Signed   By: Denman Fischer M.D.   On: 08/30/2023 21:57      Verble Styron T. Renji Berwick [facility]  If 7PM-7AM, please contact night-coverage [email or web address] 08/31/2023, 10:45 AM

## 2023-08-31 NOTE — Plan of Care (Signed)

## 2023-08-31 NOTE — Consult Note (Addendum)
 Cardiology Consultation   Patient ID: Susan [name] MRN: [id number]; DOB: [birth year]  Admit date: 08/30/2023 Date of Consult: 08/31/2023  PCP:  Aldo Hun, MD    [facility] Providers Cardiologist:  Ahmad Alert, MD   Patient Profile:   Susan [name] is a 89 y.o. female with a hx of CAD with prior PCI in 2005, residual CAD treated medically, LBBB, prior syncope felt vasovagal in nature, chronic systolic heart failure, known frequent PVCs, carotid artery disease bilateral ICA 2019, pituitary adenoma, 3.2 cm dilation of thoracic aortic arch by CT scan 2021, mild to moderate AI, moderate MR, hypothyroidism, and hyperlipidemia who is being seen 08/31/2023 for the evaluation of chest pain at the request of Dr. Mae Schlossman.  History of Present Illness:   Ms. Kulman had prior PCI and stenting of the RCA in 2005 with moderate residual 70% stenosis of the LAD stable on last heart catheterization in 2021 with patent stents in the mid RCA and distal RCA.  Nuclear stress test 07/30/2023 showed no evidence of ischemia or prior infarction.  She did have a fixed defect that appears to be due to her left bundle branch block.  EF noted to be 29%, study read as high risk due to EF.  Follow-up echocardiogram 08/2023 showed an LVEF 25-30%, moderate MR, moderate AI.  She has not tolerated Entresto in the past.  She was referred to EP but deemed not a candidate for resynchronization therapy.  She was last seen in clinic by Dr. Alroy Aspen for chest pain and shortness of breath.  Prior studies reviewed, she had not been taking Imdur  very long.  Heart monitor placed 08/28/2023 for palpitations showed primarily sinus rhythm and bundle branch block/IVCD.  She had 21 SVT runs, and PACs.  She was continued on 15 mg Imdur  to minimize headache.  She presented from home today to [facility] ED with chest pain 5 out of 10 when she woke up this morning.  This is associated with shortness of breath and relieved with morphine  and  nitro glycerin.  She has not required a standing/scheduled diuretic for her systolic heart failure and moderate MR.  She remains on 75 mg Plavix  and 40 mg Crestor .  She reports the chest pain that occurred yesterday was different than the CP she has been having over the last 6 weeks. Typically, she feels pre-syncopal and gets in the floor. This is followed by CP that feels like a pressure, palpitations, SOB and diaphoresis.  Yesterday she was already awake and tells me she had sudden onset chest pressure under her left breast without associated palpitations and diaphoresis with presyncope as usual.  She took NTG x 2, but was bothered by her chest pressure and called her daughter who recommended ER evaluation.  It is unclear to me when the chest pressure occurred and relieved by nitroglycerin  as she tells 2 different stories.  Fortunately, she is currently chest pain-free.  She had a headache after the nitroglycerin  tablets now resolved.  Attending: To me, she indicates a focal area along the left sternal border which she describes the discomfort location.  It is somewhat reproducible on exam.  She indicates that she will not feel well with the Imdur  but her symptoms did improve with nitroglycerin  somewhat.  Past Medical History:  Diagnosis Date   Chronic HFrEF (heart failure with reduced ejection fraction) (HCC)    Coronary artery disease    PTCA/stenting of RCA 2005, moderate residual 70% stenosis of LAD stable by last cath  2021   Frequent PVCs    Hyperlipidemia    Hypothyroidism    LBBB (left bundle branch block)    Mild carotid artery disease (HCC)    Osteoporosis    Syncope     Past Surgical History:  Procedure Laterality Date   APPENDECTOMY  1970s?   "ruptured"    BACK SURGERY     CATARACT EXTRACTION W/ INTRAOCULAR LENS  IMPLANT, BILATERAL Bilateral    CORONARY ANGIOPLASTY WITH STENT PLACEMENT  ~ 2009   "2 stents"   FRACTURE SURGERY     IR VERTEBROPLASTY CERV/THOR BX INC UNI/BIL  INC/INJECT/IMAGING  02/07/2018   KNEE ARTHROSCOPY Right     torn meniscus   LEFT HEART CATH AND CORONARY ANGIOGRAPHY N/A 03/21/2020   Procedure: LEFT HEART CATH AND CORONARY ANGIOGRAPHY;  Surgeon: Avanell Leigh, MD;  Location: [facility] INVASIVE CV LAB;  Service: Cardiovascular;  Laterality: N/A;   LUMBAR LAMINECTOMY  1978   OVARIAN CYST SURGERY  1970s?   "ruptured"    RIGHT/LEFT HEART CATH AND CORONARY ANGIOGRAPHY N/A 10/08/2017   Procedure: RIGHT/LEFT HEART CATH AND CORONARY ANGIOGRAPHY;  Surgeon: Sammy Crisp, MD;  Location: [facility] INVASIVE CV LAB;  Service: Cardiovascular;  Laterality: N/A;   WRIST FRACTURE SURGERY Bilateral    "2 surgeries for 2 breaks on left side; 1 surgery for 1 break on right wrist"     Home Medications:  Prior to Admission medications   Medication Sig Start Date End Date Taking? Authorizing Provider  acetaminophen  (TYLENOL ) 325 MG tablet Take 2 tablets (650 mg total) by mouth every 6 (six) hours. Patient taking differently: Take 650 mg by mouth every 6 (six) hours as needed for mild pain (pain score 1-3). 11/14/18  Yes Johnson, Clanford L, MD  Calcium  Carbonate-Vitamin D  (CALCIUM  + D PO) Take 2 tablets by mouth at bedtime.   Yes [provider]  cholecalciferol  (VITAMIN D3) 25 MCG (1000 UT) tablet Take 1,000 Units by mouth daily after breakfast.   Yes [provider]  clopidogrel  (PLAVIX ) 75 MG tablet Take 1 tablet by mouth once daily 03/03/23  Yes Nahser, Lela Purple, MD  denosumab  (PROLIA ) 60 MG/ML SOSY injection Inject 60 mg into the skin every 6 (six) months.   Yes [provider]  isosorbide  mononitrate (IMDUR ) 30 MG 24 hr tablet Take 1 tablet (30 mg total) by mouth daily. Patient taking differently: Take 15 mg by mouth daily. 07/23/23 10/21/23 Yes Nahser, Lela Purple, MD  levothyroxine  (SYNTHROID , LEVOTHROID) 75 MCG tablet Take 75 mcg by mouth daily before breakfast.  11/30/17  Yes [provider]  multivitamin-lutein (OCUVITE-LUTEIN)  CAPS capsule Take 1 capsule by mouth 2 (two) times a day.    Yes [provider]  nitroGLYCERIN  (NITROSTAT ) 0.4 MG SL tablet Place 1 tablet (0.4 mg total) under the tongue every 5 (five) minutes as needed. Patient taking differently: Place 0.4 mg under the tongue every 5 (five) minutes as needed for chest pain. 06/22/11  Yes Nahser, Lela Purple, MD  rosuvastatin  (CRESTOR ) 40 MG tablet Take 1 tablet (40 mg total) by mouth daily. 01/18/23  Yes Nahser, Lela Purple, MD  venlafaxine  XR (EFFEXOR -XR) 150 MG 24 hr capsule Take 1 capsule (150 mg total) by mouth daily with breakfast. 02/19/23  Yes Nahser, Lela Purple, MD  vitamin B-12 (CYANOCOBALAMIN ) 1000 MCG tablet Take 2,000 mcg by mouth daily.    Yes [provider]    Inpatient Medications: Scheduled Meds:  calcium -vitamin D    Oral QHS   cholecalciferol   1,000 Units Oral QPC breakfast   clopidogrel   75 mg Oral Daily   cyanocobalamin   2,000 mcg Oral Daily   heparin   5,000 Units Subcutaneous Q8H   isosorbide  mononitrate  30 mg Oral Daily   levothyroxine   75 mcg Oral QAC breakfast   multivitamin with minerals  1 tablet Oral Daily   pantoprazole   40 mg Oral Daily   rosuvastatin   40 mg Oral Daily   venlafaxine  XR  150 mg Oral Q breakfast   Continuous Infusions:  PRN Meds: acetaminophen , nitroGLYCERIN , ondansetron  (ZOFRAN ) IV  Allergies:   No Known Allergies  Social History:   Social History   Socioeconomic History   Marital status: Widowed    Spouse name: Not on file   Number of children: Not on file   Years of education: Not on file   Highest education level: Not on file  Occupational History   Not on file  Tobacco Use   Smoking status: Never   Smokeless tobacco: Never  Vaping Use   Vaping status: Never Used  Substance and Sexual Activity   Alcohol use: Yes    Alcohol/week: 1.0 standard drink of alcohol    Types: 1 Glasses of wine per week   Drug use: Never   Sexual activity: Not on file  Other Topics Concern   Not  on file  Social History Narrative   Not on file   Social Drivers of Health   Financial Resource Strain: Not on file  Food Insecurity: Not on file  Transportation Needs: Not on file  Physical Activity: Not on file  Stress: Not on file  Social Connections: Not on file  Intimate Partner Violence: Not on file    Family History:    Family History  Problem Relation Age of Onset   Alzheimer's disease Mother    Heart attack Father    Aneurysm Sister      ROS:  Please see the history of present illness.   All other ROS reviewed and negative.     Physical Exam/Data:   Vitals:   08/31/23 0600 08/31/23 0700 08/31/23 0800 08/31/23 0842  BP: 97/60 104/66 104/76   Pulse:   82   Resp: 16 19 15    Temp:    97.8 F (36.6 C)  TempSrc:    Axillary  SpO2: 95% 98% 98%   Weight:      Height:       No intake or output data in the 24 hours ending 08/31/23 1143    08/30/2023    9:28 PM 08/17/2023    1:47 PM 07/30/2023    7:22 AM  Last 3 Weights  Weight (lbs) 132 lb 4.4 oz 132 lb 6.4 oz 133 lb  Weight (kg) 60 kg 60.056 kg 60.328 kg     Body mass index is 23.43 kg/m.  General:  elderly female in NAD HEENT: normal Neck: no JVD Vascular: No carotid bruits; Distal pulses 2+ bilaterally Cardiac:  normal S1, S2; RRR; no murmur ; focal tenderness along the left parasternal border at ribs 3 and 4.  There is definitely an area with reproducible discomfort that mimics her pain. Lungs:  clear to auscultation bilaterally, no wheezing, rhonchi or rales  Abd: soft, nontender, no hepatomegaly  Ext: no edema Musculoskeletal:  No deformities, BUE and BLE strength normal and equal Skin: warm and dry  Neuro:  CNs 2-12 intact, no focal abnormalities noted Psych:  Normal affect   EKG:  The EKG was personally reviewed and demonstrates:  sinus rhythm HR 99 with LBBB Telemetry:  Telemetry was personally reviewed and demonstrates:  sinus rhythm with HR 80-90s, PVCs, couplets of PVCs  Relevant CV  Studies:  Cardiac Cath 03/21/2020: Previously placed stents in the proximal and mid RCA and distal RCA widely patent.  Smooth mid LAD 70% stenosis-recommend medical therapy.  Echo 08/06/23: Reduced LV function with an EF of 25 to 30%-global HK with basal septal hypertrophy.  Unable to determine diastolic parameters, but mild LA dilation noted.  Moderate MR noted with moderate AOV calcification/sclerosis with no stenosis.  Moderate AI.  Aaron Aas  Normal RV size and function.Normal RAP   Nuclear stress test 07/2023:   Findings are consistent with no ischemia. The study is high risk.   No ST deviation was noted.   LV perfusion is abnormal. There is no evidence of ischemia. There is no evidence of infarction. Defect 1: There is a small defect with moderate reduction in uptake present in the apical anteroseptal location(s) that is fixed. There is abnormal wall motion in the defect area. Consistent with artifact caused by left bundle branch block.   Left ventricular function is abnormal. Global function is severely reduced. End diastolic cavity size is severely enlarged. End systolic cavity size is severely enlarged.   Prior study not available for comparison.   Abnormal, high risk stress nuclear study with small defect in the distal anteroseptal wall/apex likely secondary to left bundle branch block; no ischemia; gated ejection fraction 29% with global hypokinesis and severe left ventricular enlargement; study interpreted as high risk due to reduced LV function.  Laboratory Data:  High Sensitivity Troponin:   Recent Labs  Lab 08/30/23 2138 08/30/23 2317 08/31/23 0443  TROPONINIHS 24* 25* 25*     Chemistry Recent Labs  Lab 08/30/23 2138 08/31/23 1005  NA 137 140  K 3.9 2.9*  CL 105 112*  CO2 20* 18*  GLUCOSE 104* 77  BUN 9 10  CREATININE 0.93 0.77  CALCIUM  9.5 7.1*  MG  --  1.8  GFRNONAA 59* >60  ANIONGAP 12 10    Recent Labs  Lab 08/31/23 1005  ALBUMIN 2.7*   Lipids  Recent Labs   Lab 08/31/23 0443  CHOL 122  TRIG 71  HDL 56  LDLCALC 52  CHOLHDL 2.2    Hematology Recent Labs  Lab 08/30/23 2138  WBC 9.5  RBC 3.90  HGB 12.8  HCT 38.3  MCV 98.2  MCH 32.8  MCHC 33.4  RDW 14.6  PLT 205   Thyroid  No results for input(s): "TSH", "FREET4" in the last 168 hours.  BNP Recent Labs  Lab 08/30/23 2137  BNP 859.6*    DDimer No results for input(s): "DDIMER" in the last 168 hours.   Radiology/Studies:  CT Angio Chest PE W/Cm &/Or Wo Cm Result Date: 08/30/2023 CLINICAL DATA:  Pulmonary embolism (PE) suspected, high prob, chest pain, lightheadedness EXAM: CT ANGIOGRAPHY CHEST WITH CONTRAST TECHNIQUE: Multidetector CT imaging of the chest was performed using the standard protocol during bolus administration of intravenous contrast. Multiplanar CT image reconstructions and MIPs were obtained to evaluate the vascular anatomy. RADIATION DOSE REDUCTION: This exam was performed according to the departmental dose-optimization program which includes automated exposure control, adjustment of the mA and/or kV according to patient size and/or use of iterative reconstruction technique. CONTRAST:  75mL OMNIPAQUE  IOHEXOL  350 MG/ML SOLN COMPARISON:  03/06/2023, 08/30/2023 FINDINGS: Cardiovascular: This is a technically adequate evaluation of pulmonary vasculature. No filling defects or pulmonary emboli. Heart is enlarged without  pericardial effusion. Prominent left ventricular dilatation. Calcifications of the mitral and aortic valves. Normal caliber of the thoracic aorta. Atherosclerosis of the aorta and coronary vasculature. Mediastinum/Nodes: No enlarged mediastinal, hilar, or axillary lymph nodes. Thyroid  gland, trachea, and esophagus demonstrate no significant findings. Lungs/Pleura: No acute airspace disease, effusion, or pneumothorax. Stable bibasilar scarring. Central airways are patent. 4 mm right middle lobe pulmonary nodule reference image 94/6. Upper Abdomen: No acute upper  abdominal findings. Musculoskeletal: No acute or destructive bony abnormalities. Reconstructed images demonstrate chronic T6 compression fracture and prior vertebral augmentation. Review of the MIP images confirms the above findings. IMPRESSION: 1. No evidence of pulmonary embolus. 2. Cardiomegaly, with prominent left ventricular dilatation. 3. Right solid pulmonary nodule measuring 4 mm. Per Fleischner Society Guidelines, no routine follow-up imaging is recommended. These guidelines do not apply to immunocompromised patients and patients with cancer. Follow up in patients with significant comorbidities as clinically warranted. For lung cancer screening, adhere to Lung-RADS guidelines. Reference: Radiology. 2017; 284(1):228-43. 4. Aortic Atherosclerosis (ICD10-I70.0). Coronary artery atherosclerosis. Electronically Signed   By: Bobbye Burrow M.D.   On: 08/30/2023 23:58   DG Chest 2 View Result Date: 08/30/2023 CLINICAL DATA:  Chest pain and lightheadedness. EXAM: CHEST - 2 VIEW COMPARISON:  Portable chest 07/15/2022. FINDINGS: There is mild cardiomegaly with no evidence of CHF. Stable mediastinum with aortic atherosclerosis. Areas of linear atelectasis are noted in the lateral base of the lungs. There is no convincing focal infiltrate. The remaining lungs are clear. Wedging and kyphoplasty in 1 midthoracic segment again noted with osteopenia and degenerative change. IMPRESSION: No evidence of acute chest disease. Lateral basal areas of linear atelectasis. Mild cardiomegaly. Aortic atherosclerosis. Electronically Signed   By: Denman Fischer M.D.   On: 08/30/2023 21:57     Assessment and Plan:   Chest pain CAD s/p PTCA and DES to mid-distal RCA 2005 - residual moderate proximal LAD stenosis treated medically - Recent nuclear stress test 07/2023 without ischemia - Recently started on 15 mg Imdur  - she was taking 30 mg and did not tolerate due to headeache, she reduced to 15 mg but continues to have  episodes of chest pain -Troponin 24->25->25 -EKG with LBBB -Given reassuring stress test and relatively flat troponin elevation, we will attempt to treat medically given renal function - Imdur  has been increased to 30 mg,  which she hasn't tolerated in the past due to headache - for now, discontinued imdur  and add 500 mg Ranexa twice daily - CrCl 41 Agree with switching from Imdur  to Ranexa, I communicated with Dr. Alroy Aspen who also recommended low-dose beta-blocker because of some NSVT noted on monitor so we will add Toprol  12.5 mg daily.  We have also started Ranexa.   Chronic combined systolic and diastolic heart failure Moderate MR CKD 3A - Does not require scheduled diuretic at home - Has received 40 mg IV Lasix x 1 dose, hold further diuresis -- LVEF further reduced to 25-30% on recent echo, from 30-35% - question PVC mediated, also with LBBB => Especially in light of possible PVC mediated cardiomyopathy with also having some runs of NSVT on event monitor, Dr. Alroy Aspen recommended addition of beta-blocker   Hypokalemia - K 2.9, had a severe calf cramp when I was in the room - given 40 mEq potasium + 400 mg magnesium - Mg 1.8   Hyperlipidemia with LDL less than 70 - Has been maintained on 40 mg Crestor , given renal function, may consider reducing to 20 mg 08/31/2023: Cholesterol 122;  HDL 56; LDL Cholesterol 52; Triglycerides 71; VLDL 14   Proximal SVT/PACs - On recent heart monitor - Not currently on a beta-blocker -> NSVT noted on recent monitor, will add low-dose beta-blocker.   Disposition Anti-anginals limited by BP and side effects of headache. Given recent reassuring stress test and flat troponin trend this admission, will attempt to titrate medical therapy first.  Her creatinine clearance is 41, serum creatinine 0.7.  I would discontinue Imdur  as she has not tolerated higher doses due to headache and it did not seem to resolve her chest discomfort episodes.  Will discuss  with Dr. Addie Holstein attempting Ranexa.   Risk Assessment/Risk Scores:     TIMI Risk Score for Unstable Angina or Non-ST Elevation MI:   The patient's TIMI risk score is 5, which indicates a 26% risk of all cause mortality, new or recurrent myocardial infarction or need for urgent revascularization in the next 14 days.  New [place]  Heart Association (NYHA) Functional Class NYHA Class I        For questions or updates, please contact [facility] Please consult [email or web address] for contact info under    Signed, Lamond Pilot, PA  08/31/2023 11:43 AM   ATTENDING ATTESTATION  I have seen, examined and evaluated the patient this afternoon along with Marcie Sever, PA.  After reviewing all the available data and chart, we discussed the patients laboratory, study & physical findings as well as symptoms in detail.  I agree with her findings, examination as well as impression recommendations as per our discussion.    Attending adjustments noted in italics.   She clearly has reasons for having angina although the current symptoms to me seem to be nonanginal in nature especially at the right that is reproducible on exam.  Somewhat difficult to assess fully though because she said that taking much Motrin helped.  Recommendation is to change from Imdur  to Ranexa but will also place lidocaine  patch on her chest wall and recommend heat pads.  Because of NSVT noted on her recent monitor with Dr. Alroy Aspen, we will add low-dose beta-blocker.    Arleen Lacer, MD, MS Randene Bustard, M.D., M.S. Interventional Cardiologist  [facility]  Pager # [phone] Phone # [phone] [address] [place], [place] [address]

## 2023-09-01 ENCOUNTER — Other Ambulatory Visit (HOSPITAL_COMMUNITY): Payer: Self-pay

## 2023-09-01 DIAGNOSIS — I429 Cardiomyopathy, unspecified: Secondary | ICD-10-CM | POA: Diagnosis not present

## 2023-09-01 DIAGNOSIS — R079 Chest pain, unspecified: Secondary | ICD-10-CM | POA: Diagnosis not present

## 2023-09-01 DIAGNOSIS — R072 Precordial pain: Secondary | ICD-10-CM

## 2023-09-01 DIAGNOSIS — I5022 Chronic systolic (congestive) heart failure: Secondary | ICD-10-CM | POA: Diagnosis not present

## 2023-09-01 DIAGNOSIS — F419 Anxiety disorder, unspecified: Secondary | ICD-10-CM | POA: Diagnosis not present

## 2023-09-01 DIAGNOSIS — E039 Hypothyroidism, unspecified: Secondary | ICD-10-CM | POA: Diagnosis not present

## 2023-09-01 DIAGNOSIS — I2510 Atherosclerotic heart disease of native coronary artery without angina pectoris: Secondary | ICD-10-CM | POA: Diagnosis not present

## 2023-09-01 LAB — CBC
HCT: 38.7 % (ref 36.0–46.0)
Hemoglobin: 12.9 g/dL (ref 12.0–15.0)
MCH: 32.3 pg (ref 26.0–34.0)
MCHC: 33.3 g/dL (ref 30.0–36.0)
MCV: 97 fL (ref 80.0–100.0)
Platelets: 195 10*3/uL (ref 150–400)
RBC: 3.99 MIL/uL (ref 3.87–5.11)
RDW: 14.5 % (ref 11.5–15.5)
WBC: 10.4 10*3/uL (ref 4.0–10.5)
nRBC: 0 % (ref 0.0–0.2)

## 2023-09-01 LAB — RENAL FUNCTION PANEL
Albumin: 3.4 g/dL — ABNORMAL LOW (ref 3.5–5.0)
Anion gap: 7 (ref 5–15)
BUN: 14 mg/dL (ref 8–23)
CO2: 20 mmol/L — ABNORMAL LOW (ref 22–32)
Calcium: 8.8 mg/dL — ABNORMAL LOW (ref 8.9–10.3)
Chloride: 106 mmol/L (ref 98–111)
Creatinine, Ser: 1.09 mg/dL — ABNORMAL HIGH (ref 0.44–1.00)
GFR, Estimated: 49 mL/min — ABNORMAL LOW (ref >60)
Glucose, Bld: 122 mg/dL — ABNORMAL HIGH (ref 70–99)
Phosphorus: 2.5 mg/dL (ref 2.5–4.6)
Potassium: 4.9 mmol/L (ref 3.5–5.1)
Sodium: 133 mmol/L — ABNORMAL LOW (ref 135–145)

## 2023-09-01 LAB — MAGNESIUM: Magnesium: 2.3 mg/dL (ref 1.7–2.4)

## 2023-09-01 MED ORDER — ACETAMINOPHEN 500 MG PO TABS: ORAL_TABLET | ORAL | Status: DC

## 2023-09-01 MED ORDER — ACETAMINOPHEN 500 MG PO TABS
1000.0000 mg | ORAL_TABLET | Freq: Four times a day (QID) | ORAL | Status: DC
  Administered 2023-09-01: 1000 mg via ORAL
  Filled 2023-09-01: qty 2

## 2023-09-01 MED ORDER — METOPROLOL SUCCINATE ER 25 MG PO TB24: 12.5000 mg | ORAL_TABLET | Freq: Every day | ORAL | Status: DC

## 2023-09-01 MED ORDER — MORPHINE SULFATE (PF) 2 MG/ML IV SOLN
2.0000 mg | Freq: Once | INTRAVENOUS | Status: AC
  Administered 2023-09-01: 2 mg via INTRAVENOUS
  Filled 2023-09-01: qty 1

## 2023-09-01 MED ORDER — RANOLAZINE ER 500 MG PO TB12
500.0000 mg | ORAL_TABLET | Freq: Two times a day (BID) | ORAL | 0 refills | Status: AC
  Filled 2023-09-01: qty 60, 30d supply, fill #0

## 2023-09-01 MED ORDER — METOPROLOL SUCCINATE ER 25 MG PO TB24
12.5000 mg | ORAL_TABLET | Freq: Every day | ORAL | 0 refills | Status: DC
  Filled 2023-09-01: qty 45, 90d supply, fill #0

## 2023-09-01 MED ORDER — SODIUM CHLORIDE 0.9 % IV BOLUS: 500.0000 mL | Freq: Once | INTRAVENOUS | Status: DC

## 2023-09-01 MED ORDER — OXYCODONE HCL 5 MG PO TABS: 5.0000 mg | ORAL_TABLET | Freq: Two times a day (BID) | ORAL | Status: DC | PRN

## 2023-09-01 NOTE — Progress Notes (Signed)
 Pt was not symptomatic she was assessed by two Rns

## 2023-09-01 NOTE — TOC Transition Note (Addendum)
 Transition of Care [facility]) - Discharge Note   Patient Details  Name: Susan [name] MRN: [id number] Date of Birth: 04-07-1934  Transition of Care [facility] CM/SW Contact:  Jennett Model, RN Phone Number: 09/01/2023, 11:05 AM   Clinical Narrative:     For dc today, has no needs. She has transportation.  TOC pharmacy to fill meds.        Patient Goals and CMS Choice            Discharge Placement                       Discharge Plan and Services Additional resources added to the After Visit Summary for                                       Social Drivers of Health (SDOH) Interventions SDOH Screenings   Food Insecurity: No Food Insecurity (08/31/2023)  Housing: Low Risk  (08/31/2023)  Transportation Needs: No Transportation Needs (08/31/2023)  Utilities: Not At Risk (08/31/2023)  Social Connections: Moderately Integrated (08/31/2023)  Tobacco Use: Low Risk  (08/31/2023)     Readmission Risk Interventions     No data to display

## 2023-09-01 NOTE — Progress Notes (Addendum)
 Patient Name: Susan [name] Date of Encounter: 09/01/2023 [facility] Cardiologist: Ahmad Alert, MD   Interval Summary  .    Feeling better this morning but still with pain to the anterior chest with palpation.   Vital Signs .    Vitals:   09/01/23 0220 09/01/23 0355 09/01/23 0741 09/01/23 0918  BP: 120/73 98/65 98/64  100/62  Pulse: 94 95 93   Resp: 18 18 20    Temp: 98.9 F (37.2 C) 99.5 F (37.5 C) 98.3 F (36.8 C)   TempSrc: Oral Oral Oral   SpO2: 93% 95% 94%   Weight:  58 kg    Height:        Intake/Output Summary (Last 24 hours) at 09/01/2023 1034 Last data filed at 09/01/2023 0843 Gross per 24 hour  Intake 420 ml  Output 400 ml  Net 20 ml      09/01/2023    3:55 AM 08/31/2023    3:10 PM 08/30/2023    9:28 PM  Last 3 Weights  Weight (lbs) 127 lb 13.9 oz 129 lb 13.6 oz 132 lb 4.4 oz  Weight (kg) 58 kg 58.9 kg 60 kg      Telemetry/ECG    Sinus Rhythm, occasional PVCs - Personally Reviewed  Physical Exam .    GEN: No acute distress.   Neck: No JVD Cardiac: RRR, no murmurs, rubs, or gallops. Tenderness at the left sternal border Respiratory: Clear to auscultation bilaterally. GI: Soft, nontender, non-distended  MS: No edema  Assessment & Plan .     89 y.o. female with a hx of CAD with prior PCI in 2005, residual CAD treated medically, LBBB, prior syncope felt vasovagal in nature, chronic systolic heart failure, known frequent PVCs, carotid artery disease bilateral ICA 2019, pituitary adenoma, 3.2 cm dilation of thoracic aortic arch by CT scan 2021, mild to moderate AI, moderate MR, hypothyroidism, and hyperlipidemia who was seen 08/31/2023 for the evaluation of chest pain at the request of Dr. Mae Schlossman.   Chest pain CAD s/p PTCA and DES to mid-distal RCA 2005 -- known residual moderate proximal LAD stenosis treated medically with recent nuclear stress test 07/2023 w/o ischemia  - Recently started on 15 mg Imdur  - she was taking 30 mg and did not  tolerate due to headeache, she reduced to 15 mg but continued to have episodes of chest pain -Troponin 24->25->25 - EKG with LBBB - chest pain seems to be more MSK in nature given pain with palpation, improvement with lidocaine  patch -- discontinued imdur  and added 500 mg Ranexa twice daily -- continue plavix , statin    Chronic combined systolic and diastolic heart failure Moderate MR CKD 3A - Does not require scheduled diuretic at home, s/p 40 mg IV Lasix x 1 dose, hold further diuresis. No heart failure symptoms on admission. -- LVEF further reduced to 25-30% on recent echo, from 30-35% - question PVC mediated?, also with LBBB -- recent outpatient monitor with NSVT noted, will continue low dose Toprol  XL 12.5mg  daily at bedtime  Hypokalemia -- resolved at 4.9 today, mag 2.3   Hyperlipidemia with LDL less than 70 - Has been maintained on 40 mg Crestor  -- 08/31/2023: Cholesterol 122; HDL 56; LDL Cholesterol 52; Triglycerides 71; VLDL 14   pSVT/PACs PVCs - On recent heart monitor and noted on telemetry  - continue Toprol  XL 12.5mg  daily at bedtime   Will arrange for outpatient follow up  For questions or updates, please contact [facility] Please consult  [email or web address] for contact info under        Signed, Johnie Nailer, NP    ATTENDING ATTESTATION  I have seen, examined and evaluated the patient this AM on rounds along with Johnie Nailer, NP-C.  After reviewing all the available data and chart, we discussed the patients laboratory, study & physical findings as well as symptoms in detail.  I agree with her findings, examination as well as impression recommendations as per our discussion.    Attending adjustments noted in italics.   Feeling much better overall.  Not really having much in way of chest pain but still has some reproducible pain with palpation. We have added low-dose Toprol  nightly/every evening for frequent PVCs, and have converted from Imdur  to  Ranexa for possible angina. So suggested Lidoderm  patch or heating pad for the left  parasternal chest wall pain.  Otherwise, she is stable.  I discussed with the primary team and I think she is ready for discharge home.  Will arrange follow-up.    Arleen Lacer, MD, MS Randene Bustard, M.D., M.S. Interventional Cardiologist  [facility]  Pager # [phone] Phone # [phone] [address] [place], [place] [address]

## 2023-09-01 NOTE — Progress Notes (Signed)
 Pt vitals were reassessed and this RN spoke  to Dr Mae Schlossman who asked to hold the IVF and DC pt if pt is not symptomatic.

## 2023-09-01 NOTE — Discharge Summary (Signed)
 Physician Discharge Summary  Susan [name] AYT:[id number] DOB: 10/18/1933 DOA: 08/30/2023  PCP: Aldo Hun, MD  Admit date: 08/30/2023 Discharge date: 09/01/23  Admitted From: Home Disposition: Home Recommendations for Outpatient Follow-up:  Outpatient follow-up with PCP in 1 to 2 weeks Cardiology to arrange outpatient follow-up Check CMP and CBC at follow-up Please follow up on the following pending results: None  Home Health: No need identified Equipment/Devices: No need identified  Discharge Condition: Stable CODE STATUS: Full code  Follow-up Information     Aldo Hun, MD. Schedule an appointment as soon as possible for a visit in 1 week(s).   Specialty: Internal Medicine Contact information: [address] [place] [address] [phone]                 Hospital course 89 year old F with PMH of CAD s/p RCA stenting 2005 with residual moderate proximal LAD stenosis, LBBB, HFrEF (NYHA II), frequent PVCs and recurrent chest pain associated with near syncope and diaphoresis presenting with unusual substernal pressure-like chest pain that was worse with deep breathing.  Recent nuclear stress test on 3/28 was high risk, with no evidence of infarction or ischemia but a small apical defect.  Recent TTE on 4/4 showed significant drop in EF to 25 to 30% with GH.   In ED, patient was chest pain-free after IV morphine  and nitro.  EKG showed chronic LBBB.  Troponin 24, 25 and 25.  CT angio chest negative for PE.  BNP elevated to 860.  Admission requested for chest pain workup.  Cardiology consulted.  The next day, evaluated by cardiology and started on Ranexa.  Chest pain felt to be musculoskeletal.   On the day of discharge, cleared for discharge by cardiology on Ranexa and low-dose Toprol -XL.  Imdur  discontinued.   See individual problem list below for more.   Problems addressed during this hospitalization Chest pain: Likely musculoskeletal.  Reproducible with  palpation over left chest wall.  This is different from her usual chest pain which was associated with near syncope and diaphoresis.  Recently evaluated by cardiology with nuclear stress test and TTE as above.  Her usual chest pain is concerning for stable angina.  LHC in 2021 showed patent RCA stent and LAD lesions with 70% stenosis felt to be unchanged since 2019.  Mildly elevated serial troponin without significant delta.  EKG with chronic LBBB.  CT angio chest without significant finding. LDL 52.  Evaluated by cardiology and started on Ranexa. -Cleared for discharge by cardiology on Ranexa, low dose Toprol -XL, Plavix  and aspirin . -Outpatient follow-up with cardiology   Chronic systolic CHF: TTE on 4/4 with LVEF of 25 to 30%, GH.  BNP elevated to 860 but appears euvolemic on exam.  No orthopnea, PND or edema.  Not on diuretics at home. -Low-dose Toprol -XL started.   History of CAD s/p RCA stents in 2005 -Management as above    Recurrent near syncope: This comes along with chest pain.  Long-term monitor with HR ranging from 66-207, some bundle branch block/IVCD, 2 VT runs, SVTs -Started on low-dose Toprol  XL per cardiology   Hyperlipidemia: LDL 52. -Continue Crestor .   Depression: Stable -Continue with home Effexor    Incidental pulmonary: CT angio showed 4 mm incidental nodule -No further workup is recommended            Time spent 35 minutes  Vital signs Vitals:   09/01/23 1121 09/01/23 1122 09/01/23 1154 09/01/23 1200  BP: (!) 85/56  95/63 (!) 91/58  Pulse: 81  86  Temp: (!) 97.5 F (36.4 C)     Resp: 16     Height:      Weight:      SpO2: 96% 91%    TempSrc: Oral     BMI (Calculated):         Discharge exam  GENERAL: No apparent distress.  Nontoxic. HEENT: MMM.  Vision and hearing grossly intact.  NECK: Supple.  No apparent JVD.  RESP:  No IWOB.  Fair aeration bilaterally. CVS:  RRR. Heart sounds normal.  ABD/GI/GU: BS+. Abd soft, NTND.  MSK/EXT:  Moves  extremities. No apparent deformity. No edema.  SKIN: no apparent skin lesion or wound NEURO: Awake and alert. Oriented appropriately.  No apparent focal neuro deficit. PSYCH: Calm. Normal affect.   Left chest wall/breast exam: No swelling, lesion, skin color change or palpable nodule.  Chest wall tenderness over heart apex area.  Stafford Eagles, RN was present as Biomedical engineer during breast exam.    Discharge Instructions Discharge Instructions     Diet - low sodium heart healthy   Complete by: As directed    Discharge instructions   Complete by: As directed    It has been a pleasure taking care of you!  You were hospitalized due to chest pain likely from chest wall.  You may try Tylenol  around-the-clock and heating pad to help with pain.  Cardiology has also made some changes to your heart medication.  Please review your new medication list and the directions on your medications before you take them.  Follow-up with your primary care doctor in 1 to 2 weeks or sooner if needed.  Follow-up with cardiology per their recommendation.   Take care,   Increase activity slowly   Complete by: As directed       Allergies as of 09/01/2023   No Known Allergies      Medication List     STOP taking these medications    isosorbide  mononitrate 30 MG 24 hr tablet Commonly known as: IMDUR        TAKE these medications    acetaminophen  500 MG tablet Commonly known as: TYLENOL  Take 2 tablets (1,000 mg total) by mouth every 8 (eight) hours for 4 days, THEN 2 tablets (1,000 mg total) every 8 (eight) hours as needed for up to 15 days. Start taking on: September 01, 2023 What changed:  medication strength See the new instructions.   CALCIUM  + D PO Take 2 tablets by mouth at bedtime.   cholecalciferol  25 MCG (1000 UNIT) tablet Commonly known as: VITAMIN D3 Take 1,000 Units by mouth daily after breakfast.   clopidogrel  75 MG tablet Commonly known as: PLAVIX  Take 1 tablet by mouth once daily    cyanocobalamin  1000 MCG tablet Commonly known as: VITAMIN B12 Take 2,000 mcg by mouth daily.   denosumab  60 MG/ML Sosy injection Commonly known as: PROLIA  Inject 60 mg into the skin every 6 (six) months.   levothyroxine  75 MCG tablet Commonly known as: SYNTHROID  Take 75 mcg by mouth daily before breakfast.   metoprolol  succinate 25 MG 24 hr tablet Commonly known as: TOPROL -XL Take 0.5 tablets (12.5 mg total) by mouth daily.   multivitamin-lutein Caps capsule Take 1 capsule by mouth 2 (two) times a day.   nitroGLYCERIN  0.4 MG SL tablet Commonly known as: NITROSTAT  Place 1 tablet (0.4 mg total) under the tongue every 5 (five) minutes as needed. What changed: reasons to take this   ranolazine 500 MG 12 hr tablet Commonly  known as: RANEXA Take 1 tablet (500 mg total) by mouth 2 (two) times daily.   rosuvastatin  40 MG tablet Commonly known as: CRESTOR  Take 1 tablet (40 mg total) by mouth daily.   venlafaxine  XR 150 MG 24 hr capsule Commonly known as: EFFEXOR -XR Take 1 capsule (150 mg total) by mouth daily with breakfast.        Consultations: Cardiology  Procedures/Studies:   LONG TERM MONITOR (3-14 DAYS) Result Date: 08/31/2023   Predominant rhythm is sinus rhythm.   She had 2 episodes of nonsustained ventricular tachycardia.  The fastest interval lasted 8 beats with a maximal heart rate of 207.  The longest episode lasted 7 beats with an average heart rate of 173.  Neither of these were symptomatic.   She had 21 beats of supraventricular tachycardia (nonsustained).  The fastest and longest interval lasted 9 beats with a maximum heart rate of 185 and an average heart rate of 148.   Occasional premature ventricular contractions, rare premature atrial contractions Patch Wear Time:  10 days and 2 hours (2025-04-15T14:26:27-0400 to 2025-04-25T17:19:53-0400) Patient had a min HR of 66 bpm, max HR of 207 bpm, and avg HR of 91 bpm. Predominant underlying rhythm was Sinus Rhythm.  Bundle Branch Block/IVCD was present. 2 Ventricular Tachycardia runs occurred, the run with the fastest interval lasting 8 beats with  a max rate of 207 bpm, the longest lasting 7 beats with an avg rate of 173 bpm. 21 Supraventricular Tachycardia runs occurred, the run with the fastest interval lasting 9 beats with a max rate of 185 bpm (avg 148 bpm); the run with the fastest interval was also the longest. Isolated SVEs were occasional (3.6%, 36157), SVE Couplets were rare (<1.0%, 910), and SVE Triplets were rare (<1.0%, 119). Isolated VEs were occasional (3.1%, 31021), VE Couplets were rare (<1.0%, 1032), and VE Triplets were rare (<1.0%, 22). Ventricular Bigeminy and Trigeminy were present.   CT Angio Chest PE W/Cm &/Or Wo Cm Result Date: 08/30/2023 CLINICAL DATA:  Pulmonary embolism (PE) suspected, high prob, chest pain, lightheadedness EXAM: CT ANGIOGRAPHY CHEST WITH CONTRAST TECHNIQUE: Multidetector CT imaging of the chest was performed using the standard protocol during bolus administration of intravenous contrast. Multiplanar CT image reconstructions and MIPs were obtained to evaluate the vascular anatomy. RADIATION DOSE REDUCTION: This exam was performed according to the departmental dose-optimization program which includes automated exposure control, adjustment of the mA and/or kV according to patient size and/or use of iterative reconstruction technique. CONTRAST:  75mL OMNIPAQUE  IOHEXOL  350 MG/ML SOLN COMPARISON:  03/06/2023, 08/30/2023 FINDINGS: Cardiovascular: This is a technically adequate evaluation of pulmonary vasculature. No filling defects or pulmonary emboli. Heart is enlarged without pericardial effusion. Prominent left ventricular dilatation. Calcifications of the mitral and aortic valves. Normal caliber of the thoracic aorta. Atherosclerosis of the aorta and coronary vasculature. Mediastinum/Nodes: No enlarged mediastinal, hilar, or axillary lymph nodes. Thyroid  gland, trachea, and  esophagus demonstrate no significant findings. Lungs/Pleura: No acute airspace disease, effusion, or pneumothorax. Stable bibasilar scarring. Central airways are patent. 4 mm right middle lobe pulmonary nodule reference image 94/6. Upper Abdomen: No acute upper abdominal findings. Musculoskeletal: No acute or destructive bony abnormalities. Reconstructed images demonstrate chronic T6 compression fracture and prior vertebral augmentation. Review of the MIP images confirms the above findings. IMPRESSION: 1. No evidence of pulmonary embolus. 2. Cardiomegaly, with prominent left ventricular dilatation. 3. Right solid pulmonary nodule measuring 4 mm. Per Fleischner Society Guidelines, no routine follow-up imaging is recommended. These guidelines do not apply to  immunocompromised patients and patients with cancer. Follow up in patients with significant comorbidities as clinically warranted. For lung cancer screening, adhere to Lung-RADS guidelines. Reference: Radiology. 2017; 284(1):228-43. 4. Aortic Atherosclerosis (ICD10-I70.0). Coronary artery atherosclerosis. Electronically Signed   By: Bobbye Burrow M.D.   On: 08/30/2023 23:58   DG Chest 2 View Result Date: 08/30/2023 CLINICAL DATA:  Chest pain and lightheadedness. EXAM: CHEST - 2 VIEW COMPARISON:  Portable chest 07/15/2022. FINDINGS: There is mild cardiomegaly with no evidence of CHF. Stable mediastinum with aortic atherosclerosis. Areas of linear atelectasis are noted in the lateral base of the lungs. There is no convincing focal infiltrate. The remaining lungs are clear. Wedging and kyphoplasty in 1 midthoracic segment again noted with osteopenia and degenerative change. IMPRESSION: No evidence of acute chest disease. Lateral basal areas of linear atelectasis. Mild cardiomegaly. Aortic atherosclerosis. Electronically Signed   By: Denman Fischer M.D.   On: 08/30/2023 21:57   ECHOCARDIOGRAM COMPLETE Result Date: 08/06/2023    ECHOCARDIOGRAM REPORT   Patient  Name:   Susan [name] Date of Exam: 08/06/2023 Medical Rec #:  [id number]        Height:       63.0 in Accession #:    [id number]       Weight:       133.0 lb Date of Birth:  10-03-1933         BSA:          1.626 m Patient Age:    89 years         BP:           92/66 mmHg Patient Gender: F                HR:           59 bpm. Exam Location:  [address] Procedure: 2D Echo, 3D Echo, Cardiac Doppler and Color Doppler (Both Spectral            and Color Flow Doppler were utilized during procedure). Indications:    R93.1 Decreaesed ejection fraction  History:        Patient has prior history of Echocardiogram examinations, most                 recent 10/27/2021. Chronic systolic CHF, CAD, s/p PTCA stent RCA,                 Myocardial perfusion test 07/30/23 LVEF 29% LBBB fixed defect,                 Arrythmias:LBBB, Signs/Symptoms:Chest Pain and Shortness of                 Breath; Risk Factors:Dyslipidemia. Previous echo showed LVEF 35%                 mild to moderate AR, moderate PR/TR/MR.  Sonographer:    Donnita Gales BA, RDCS Referring Phys: [phone] PHILIP J NAHSER IMPRESSIONS  1. Left ventricular ejection fraction, by estimation, is 25 to 30%. The left ventricle has severely decreased function. The left ventricle demonstrates global hypokinesis. There is mild left ventricular hypertrophy of the basal-septal segment. Left ventricular diastolic parameters are indeterminate.  2. Right ventricular systolic function is normal. The right ventricular size is normal.  3. Left atrial size was mildly dilated.  4. The mitral valve is normal in structure. Moderate mitral valve regurgitation. No evidence of mitral stenosis.  5. The aortic valve is tricuspid. There is moderate calcification of  the aortic valve. There is moderate thickening of the aortic valve. Aortic valve regurgitation is moderate. Aortic valve sclerosis is present, with no evidence of aortic valve stenosis. Aortic regurgitation PHT measures 343 msec.  6. The  inferior vena cava is normal in size with greater than 50% respiratory variability, suggesting right atrial pressure of 3 mmHg. FINDINGS  Left Ventricle: Left ventricular ejection fraction, by estimation, is 25 to 30%. The left ventricle has severely decreased function. The left ventricle demonstrates global hypokinesis. The left ventricular internal cavity size was normal in size. There is mild left ventricular hypertrophy of the basal-septal segment. Left ventricular diastolic parameters are indeterminate. Right Ventricle: The right ventricular size is normal. No increase in right ventricular wall thickness. Right ventricular systolic function is normal. Left Atrium: Left atrial size was mildly dilated. Right Atrium: Right atrial size was normal in size. Pericardium: There is no evidence of pericardial effusion. Mitral Valve: The mitral valve is normal in structure. Moderate mitral valve regurgitation. No evidence of mitral valve stenosis. Tricuspid Valve: The tricuspid valve is normal in structure. Tricuspid valve regurgitation is mild . No evidence of tricuspid stenosis. Aortic Valve: The aortic valve is tricuspid. There is moderate calcification of the aortic valve. There is moderate thickening of the aortic valve. Aortic valve regurgitation is moderate. Aortic regurgitation PHT measures 343 msec. Aortic valve sclerosis  is present, with no evidence of aortic valve stenosis. Pulmonic Valve: The pulmonic valve was normal in structure. Pulmonic valve regurgitation is mild. No evidence of pulmonic stenosis. Aorta: The aortic root is normal in size and structure. Venous: The inferior vena cava is normal in size with greater than 50% respiratory variability, suggesting right atrial pressure of 3 mmHg. IAS/Shunts: No atrial level shunt detected by color flow Doppler.  LEFT VENTRICLE PLAX 2D LVIDd:         5.10 cm   Diastology LVIDs:         4.60 cm   LV e' medial:   7.25 cm/s LV PW:         1.00 cm   LV E/e' medial:  15.4 LV IVS:        1.30 cm LVOT diam:     2.40 cm LV SV:         65 LV SV Index:   40 LVOT Area:     4.52 cm  RIGHT VENTRICLE            IVC RV Basal diam:  2.90 cm    IVC diam: 1.30 cm RV Mid diam:    2.20 cm RV S prime:     9.13 cm/s TAPSE (M-mode): 1.9 cm RVSP:           46.8 mmHg LEFT ATRIUM             Index        RIGHT ATRIUM           Index LA diam:        2.70 cm 1.66 cm/m   RA Pressure: 3.00 mmHg LA Vol (A2C):   64.4 ml 39.61 ml/m  RA Area:     10.70 cm LA Vol (A4C):   51.7 ml 31.80 ml/m  RA Volume:   23.60 ml  14.52 ml/m LA Biplane Vol: 59.7 ml 36.72 ml/m  AORTIC VALVE             PULMONIC VALVE LVOT Vmax:   81.92 cm/s  PR End Diast Vel: 10.56 msec LVOT Vmean:  53.520  cm/s LVOT VTI:    0.144 m AI PHT:      343 msec  AORTA Ao Root diam: 3.70 cm Ao Asc diam:  3.60 cm MITRAL VALVE                TRICUSPID VALVE MV Area (PHT): 2.61 cm     TR Peak grad:   43.8 mmHg MV Decel Time: 291 msec     TR Vmax:        331.00 cm/s MV E velocity: 111.70 cm/s  Estimated RAP:  3.00 mmHg MV A velocity: 121.00 cm/s  RVSP:           46.8 mmHg MV E/A ratio:  0.92                             SHUNTS                             Systemic VTI:  0.14 m                             Systemic Diam: 2.40 cm Dorothye Gathers MD Electronically signed by Dorothye Gathers MD Signature Date/Time: 08/06/2023/10:41:38 AM    Final        The results of significant diagnostics from this hospitalization (including imaging, microbiology, ancillary and laboratory) are listed below for reference.     Microbiology: No results found for this or any previous visit (from the past 240 hours).   Labs:  CBC: Recent Labs  Lab 08/30/23 2138 09/01/23 0251  WBC 9.5 10.4  HGB 12.8 12.9  HCT 38.3 38.7  MCV 98.2 97.0  PLT 205 195   BMP &GFR Recent Labs  Lab 08/30/23 2138 08/31/23 1005 09/01/23 0251  NA 137 140 133*  K 3.9 2.9* 4.9  CL 105 112* 106  CO2 20* 18* 20*  GLUCOSE 104* 77 122*  BUN 9 10 14   CREATININE 0.93 0.77 1.09*   CALCIUM  9.5 7.1* 8.8*  MG  --  1.8 2.3  PHOS  --  2.2* 2.5   Estimated Creatinine Clearance: 30.1 mL/min (A) (by C-G formula based on SCr of 1.09 mg/dL (H)). Liver & Pancreas: Recent Labs  Lab 08/31/23 1005 09/01/23 0251  ALBUMIN 2.7* 3.4*   No results for input(s): "LIPASE", "AMYLASE" in the last 168 hours. No results for input(s): "AMMONIA" in the last 168 hours. Diabetic: No results for input(s): "HGBA1C" in the last 72 hours. No results for input(s): "GLUCAP" in the last 168 hours. Cardiac Enzymes: No results for input(s): "CKTOTAL", "CKMB", "CKMBINDEX", "TROPONINI" in the last 168 hours. No results for input(s): "PROBNP" in the last 8760 hours. Coagulation Profile: No results for input(s): "INR", "PROTIME" in the last 168 hours. Thyroid  Function Tests: No results for input(s): "TSH", "T4TOTAL", "FREET4", "T3FREE", "THYROIDAB" in the last 72 hours. Lipid Profile: Recent Labs    08/31/23 0443  CHOL 122  HDL 56  LDLCALC 52  TRIG 71  CHOLHDL 2.2   Anemia Panel: No results for input(s): "VITAMINB12", "FOLATE", "FERRITIN", "TIBC", "IRON", "RETICCTPCT" in the last 72 hours. Urine analysis:    Component Value Date/Time   COLORURINE STRAW (A) 03/06/2023 1420   APPEARANCEUR CLEAR 03/06/2023 1420   LABSPEC 1.018 03/06/2023 1420   PHURINE 7.0 03/06/2023 1420   GLUCOSEU NEGATIVE 03/06/2023 1420   HGBUR NEGATIVE 03/06/2023 1420  BILIRUBINUR NEGATIVE 03/06/2023 1420   KETONESUR 5 (A) 03/06/2023 1420   PROTEINUR NEGATIVE 03/06/2023 1420   UROBILINOGEN 0.2 07/13/2011 2036   NITRITE NEGATIVE 03/06/2023 1420   LEUKOCYTESUR NEGATIVE 03/06/2023 1420   Sepsis Labs: Invalid input(s): "PROCALCITONIN", "LACTICIDVEN"   SIGNED:  Theadore Finger, MD  [facility] 09/01/2023, 3:23 PM

## 2023-09-05 ENCOUNTER — Other Ambulatory Visit: Payer: Self-pay

## 2023-09-05 ENCOUNTER — Emergency Department (HOSPITAL_COMMUNITY)

## 2023-09-05 ENCOUNTER — Inpatient Hospital Stay (HOSPITAL_COMMUNITY)
Admission: EM | Admit: 2023-09-05 | Discharge: 2023-09-09 | DRG: 286 | Disposition: A | Discharge status: Home / Self Care | Attending: Cardiovascular Disease | Admitting: Cardiovascular Disease

## 2023-09-05 ENCOUNTER — Encounter (HOSPITAL_COMMUNITY): Payer: Self-pay | Admitting: Emergency Medicine

## 2023-09-05 DIAGNOSIS — J810 Acute pulmonary edema: Secondary | ICD-10-CM | POA: Diagnosis present

## 2023-09-05 DIAGNOSIS — I493 Ventricular premature depolarization: Secondary | ICD-10-CM | POA: Diagnosis not present

## 2023-09-05 DIAGNOSIS — I25110 Atherosclerotic heart disease of native coronary artery with unstable angina pectoris: Secondary | ICD-10-CM | POA: Diagnosis not present

## 2023-09-05 DIAGNOSIS — Z955 Presence of coronary angioplasty implant and graft: Secondary | ICD-10-CM

## 2023-09-05 DIAGNOSIS — Z9842 Cataract extraction status, left eye: Secondary | ICD-10-CM

## 2023-09-05 DIAGNOSIS — I200 Unstable angina: Secondary | ICD-10-CM | POA: Diagnosis present

## 2023-09-05 DIAGNOSIS — I2489 Other forms of acute ischemic heart disease: Secondary | ICD-10-CM | POA: Diagnosis present

## 2023-09-05 DIAGNOSIS — I428 Other cardiomyopathies: Secondary | ICD-10-CM | POA: Diagnosis present

## 2023-09-05 DIAGNOSIS — I4891 Unspecified atrial fibrillation: Secondary | ICD-10-CM | POA: Diagnosis not present

## 2023-09-05 DIAGNOSIS — I447 Left bundle-branch block, unspecified: Secondary | ICD-10-CM | POA: Diagnosis not present

## 2023-09-05 DIAGNOSIS — E039 Hypothyroidism, unspecified: Secondary | ICD-10-CM | POA: Diagnosis not present

## 2023-09-05 DIAGNOSIS — I480 Paroxysmal atrial fibrillation: Secondary | ICD-10-CM | POA: Diagnosis not present

## 2023-09-05 DIAGNOSIS — Z961 Presence of intraocular lens: Secondary | ICD-10-CM | POA: Diagnosis not present

## 2023-09-05 DIAGNOSIS — Z9841 Cataract extraction status, right eye: Secondary | ICD-10-CM | POA: Diagnosis not present

## 2023-09-05 DIAGNOSIS — Z7902 Long term (current) use of antithrombotics/antiplatelets: Secondary | ICD-10-CM

## 2023-09-05 DIAGNOSIS — Z79890 Hormone replacement therapy (postmenopausal): Secondary | ICD-10-CM | POA: Diagnosis not present

## 2023-09-05 DIAGNOSIS — R0789 Other chest pain: Secondary | ICD-10-CM | POA: Diagnosis not present

## 2023-09-05 DIAGNOSIS — R079 Chest pain, unspecified: Secondary | ICD-10-CM | POA: Diagnosis not present

## 2023-09-05 DIAGNOSIS — E785 Hyperlipidemia, unspecified: Secondary | ICD-10-CM | POA: Diagnosis not present

## 2023-09-05 DIAGNOSIS — D352 Benign neoplasm of pituitary gland: Secondary | ICD-10-CM | POA: Diagnosis not present

## 2023-09-05 DIAGNOSIS — R531 Weakness: Secondary | ICD-10-CM | POA: Diagnosis not present

## 2023-09-05 DIAGNOSIS — R918 Other nonspecific abnormal finding of lung field: Secondary | ICD-10-CM | POA: Diagnosis not present

## 2023-09-05 DIAGNOSIS — Z79899 Other long term (current) drug therapy: Secondary | ICD-10-CM | POA: Diagnosis not present

## 2023-09-05 DIAGNOSIS — M810 Age-related osteoporosis without current pathological fracture: Secondary | ICD-10-CM | POA: Diagnosis present

## 2023-09-05 DIAGNOSIS — I5023 Acute on chronic systolic (congestive) heart failure: Secondary | ICD-10-CM | POA: Diagnosis not present

## 2023-09-05 DIAGNOSIS — Z7982 Long term (current) use of aspirin: Secondary | ICD-10-CM

## 2023-09-05 DIAGNOSIS — I517 Cardiomegaly: Secondary | ICD-10-CM | POA: Diagnosis not present

## 2023-09-05 DIAGNOSIS — Z9049 Acquired absence of other specified parts of digestive tract: Secondary | ICD-10-CM | POA: Diagnosis not present

## 2023-09-05 DIAGNOSIS — Z8249 Family history of ischemic heart disease and other diseases of the circulatory system: Secondary | ICD-10-CM

## 2023-09-05 DIAGNOSIS — R42 Dizziness and giddiness: Secondary | ICD-10-CM | POA: Diagnosis not present

## 2023-09-05 DIAGNOSIS — R0603 Acute respiratory distress: Secondary | ICD-10-CM | POA: Diagnosis not present

## 2023-09-05 DIAGNOSIS — I25118 Atherosclerotic heart disease of native coronary artery with other forms of angina pectoris: Secondary | ICD-10-CM | POA: Diagnosis not present

## 2023-09-05 DIAGNOSIS — Z7901 Long term (current) use of anticoagulants: Secondary | ICD-10-CM | POA: Diagnosis not present

## 2023-09-05 DIAGNOSIS — N179 Acute kidney failure, unspecified: Secondary | ICD-10-CM | POA: Diagnosis present

## 2023-09-05 DIAGNOSIS — R000 Tachycardia, unspecified: Secondary | ICD-10-CM | POA: Diagnosis not present

## 2023-09-05 LAB — COMPREHENSIVE METABOLIC PANEL WITH GFR
ALT: 15 U/L (ref 0–44)
AST: 19 U/L (ref 15–41)
Albumin: 2.8 g/dL — ABNORMAL LOW (ref 3.5–5.0)
Alkaline Phosphatase: 34 U/L — ABNORMAL LOW (ref 38–126)
Anion gap: 8 (ref 5–15)
BUN: 14 mg/dL (ref 8–23)
CO2: 19 mmol/L — ABNORMAL LOW (ref 22–32)
Calcium: 8.3 mg/dL — ABNORMAL LOW (ref 8.9–10.3)
Chloride: 109 mmol/L (ref 98–111)
Creatinine, Ser: 1.11 mg/dL — ABNORMAL HIGH (ref 0.44–1.00)
GFR, Estimated: 48 mL/min — ABNORMAL LOW (ref >60)
Glucose, Bld: 124 mg/dL — ABNORMAL HIGH (ref 70–99)
Potassium: 3.5 mmol/L (ref 3.5–5.1)
Sodium: 136 mmol/L (ref 135–145)
Total Bilirubin: 0.5 mg/dL (ref 0.0–1.2)
Total Protein: 6 g/dL — ABNORMAL LOW (ref 6.5–8.1)

## 2023-09-05 LAB — CBC WITH DIFFERENTIAL/PLATELET
Abs Immature Granulocytes: 0.03 10*3/uL (ref 0.00–0.07)
Basophils Absolute: 0 10*3/uL (ref 0.0–0.1)
Basophils Relative: 0 %
Eosinophils Absolute: 0 10*3/uL (ref 0.0–0.5)
Eosinophils Relative: 0 %
HCT: 33.7 % — ABNORMAL LOW (ref 36.0–46.0)
Hemoglobin: 11.1 g/dL — ABNORMAL LOW (ref 12.0–15.0)
Immature Granulocytes: 0 %
Lymphocytes Relative: 22 %
Lymphs Abs: 2.2 10*3/uL (ref 0.7–4.0)
MCH: 32.9 pg (ref 26.0–34.0)
MCHC: 32.9 g/dL (ref 30.0–36.0)
MCV: 100 fL (ref 80.0–100.0)
Monocytes Absolute: 1.2 10*3/uL — ABNORMAL HIGH (ref 0.1–1.0)
Monocytes Relative: 12 %
Neutro Abs: 6.5 10*3/uL (ref 1.7–7.7)
Neutrophils Relative %: 66 %
Platelets: 220 10*3/uL (ref 150–400)
RBC: 3.37 MIL/uL — ABNORMAL LOW (ref 3.87–5.11)
RDW: 14.7 % (ref 11.5–15.5)
WBC: 9.9 10*3/uL (ref 4.0–10.5)
nRBC: 0 % (ref 0.0–0.2)

## 2023-09-05 LAB — TROPONIN I (HIGH SENSITIVITY)
Troponin I (High Sensitivity): 20 ng/L — ABNORMAL HIGH (ref <18)
Troponin I (High Sensitivity): 19 ng/L — ABNORMAL HIGH (ref <18)

## 2023-09-05 LAB — BRAIN NATRIURETIC PEPTIDE: B Natriuretic Peptide: 1134.1 pg/mL — ABNORMAL HIGH (ref 0.0–100.0)

## 2023-09-05 MED ORDER — CLOPIDOGREL BISULFATE 75 MG PO TABS
75.0000 mg | ORAL_TABLET | Freq: Every day | ORAL | Status: DC
  Administered 2023-09-06 – 2023-09-07 (×2): 75 mg via ORAL
  Filled 2023-09-05 (×3): qty 1

## 2023-09-05 MED ORDER — METOPROLOL SUCCINATE ER 25 MG PO TB24
12.5000 mg | ORAL_TABLET | Freq: Every day | ORAL | Status: DC
  Administered 2023-09-06 – 2023-09-09 (×3): 12.5 mg via ORAL
  Filled 2023-09-05 (×4): qty 1

## 2023-09-05 MED ORDER — LACTATED RINGERS IV BOLUS
1000.0000 mL | Freq: Once | INTRAVENOUS | Status: AC
  Administered 2023-09-05: 1000 mL via INTRAVENOUS

## 2023-09-05 MED ORDER — RANOLAZINE ER 500 MG PO TB12
500.0000 mg | ORAL_TABLET | Freq: Two times a day (BID) | ORAL | Status: DC
  Administered 2023-09-06 – 2023-09-09 (×7): 500 mg via ORAL
  Filled 2023-09-05 (×7): qty 1

## 2023-09-05 MED ORDER — FENTANYL CITRATE PF 50 MCG/ML IJ SOSY
50.0000 ug | PREFILLED_SYRINGE | Freq: Once | INTRAMUSCULAR | Status: AC
  Administered 2023-09-05: 50 ug via INTRAVENOUS
  Filled 2023-09-05: qty 1

## 2023-09-05 MED ORDER — LEVOTHYROXINE SODIUM 75 MCG PO TABS
75.0000 ug | ORAL_TABLET | Freq: Every day | ORAL | Status: DC
  Administered 2023-09-06 – 2023-09-09 (×4): 75 ug via ORAL
  Filled 2023-09-05 (×4): qty 1

## 2023-09-05 MED ORDER — ALBUMIN HUMAN 25 % IV SOLN
25.0000 g | Freq: Once | INTRAVENOUS | Status: AC
  Administered 2023-09-05: 25 g via INTRAVENOUS
  Filled 2023-09-05: qty 100

## 2023-09-05 MED ORDER — VENLAFAXINE HCL ER 75 MG PO CP24
150.0000 mg | ORAL_CAPSULE | Freq: Every day | ORAL | Status: DC
  Administered 2023-09-06 – 2023-09-09 (×4): 150 mg via ORAL
  Filled 2023-09-05: qty 1
  Filled 2023-09-05: qty 2
  Filled 2023-09-05: qty 1
  Filled 2023-09-05: qty 2

## 2023-09-05 MED ORDER — KETOROLAC TROMETHAMINE 15 MG/ML IJ SOLN
15.0000 mg | Freq: Once | INTRAMUSCULAR | Status: AC
  Administered 2023-09-05: 15 mg via INTRAVENOUS
  Filled 2023-09-05: qty 1

## 2023-09-05 MED ORDER — ROSUVASTATIN CALCIUM 20 MG PO TABS
40.0000 mg | ORAL_TABLET | Freq: Every day | ORAL | Status: DC
  Administered 2023-09-06 – 2023-09-09 (×4): 40 mg via ORAL
  Filled 2023-09-05 (×4): qty 2

## 2023-09-05 MED ORDER — ASPIRIN 81 MG PO CHEW: 81.0000 mg | CHEWABLE_TABLET | Freq: Every day | ORAL | Status: DC

## 2023-09-05 MED ORDER — LIDOCAINE 5 % EX PTCH
1.0000 | MEDICATED_PATCH | CUTANEOUS | Status: DC
  Administered 2023-09-05 – 2023-09-07 (×3): 1 via TRANSDERMAL
  Filled 2023-09-05 (×4): qty 1

## 2023-09-05 NOTE — ED Triage Notes (Signed)
 Pt BIB by [facility] for ongoing CP since this morning. Hypotensive en route, 750 ml NS given. Hx of CHF. Pt took 324 mg ASA prior to EMS arrival. EMS reports lethargy on arrival.  EMS VS: BP 100/50 HR 100s LBBB w/ PVCs CBG 154

## 2023-09-05 NOTE — ED Provider Notes (Signed)
 Signout from Dr. Jeannie Milo.  89 year old female with known coronary disease here with some recurrent chest pain.  Was admitted recently for same.  Troponins unchanged.  She is pending cardiology consult. Physical Exam  BP (!) 100/57   Pulse 84   Temp 97.8 F (36.6 C) (Oral)   Resp 19   SpO2 100%   Physical Exam  Procedures  Procedures  ED Course / MDM    Medical Decision Making Amount and/or Complexity of Data Reviewed Labs: ordered. Radiology: ordered. ECG/medicine tests: ordered.  Risk Prescription drug management.   Patient was seen by cardiology Dr. Jolan Natal and he is recommending admission to cardiology service for further evaluation.       Tonya Fredrickson, MD 09/05/23 [phone]

## 2023-09-05 NOTE — ED Provider Notes (Addendum)
 [place] EMERGENCY DEPARTMENT AT [facility] Provider Note  CSN: [id number] Arrival date & time: 09/05/23 1805  Chief Complaint(s) Chest Pain  HPI Susan [name] is a 89 y.o. female with PMH CHF, CAD status post RCA stenting in 2005 with residual moderate proximal LAD stenosis, recurrent chest pain with near syncope and diaphoresis, recent hospital admission and discharged on 08/30/2023 for high risk chest pain who presents emergency room for evaluation of recurrent chest pain.  During hospitalization, pain felt to be musculoskeletal as it is reproducible.  Patient did have a recent PE study that was reassuringly negative.  States that chest pain returned this morning, substernal along the rib line on the left.  Took 324 of aspirin  prior to arrival.  Denies associated shortness of breath, abdominal pain, nausea, vomiting or other systemic symptoms.   Past Medical History Past Medical History:  Diagnosis Date   Chronic HFrEF (heart failure with reduced ejection fraction) (HCC)    Coronary artery disease    PTCA/stenting of RCA 2005, moderate residual 70% stenosis of LAD stable by last cath 2021   Frequent PVCs    Hyperlipidemia    Hypothyroidism    LBBB (left bundle branch block)    Mild carotid artery disease (HCC)    Osteoporosis    Syncope    Patient Active Problem List   Diagnosis Date Noted   Weakness 07/16/2022   Osteoporosis 09/16/2021   Chest pain 03/21/2020   Syncope and collapse 11/13/2018   Syncope 11/12/2018   Fall 11/12/2018   Right clavicle fracture 11/12/2018   Chronic systolic CHF (congestive heart failure) (HCC) 02/11/2018   Coronary artery disease    Dyspnea on exertion 10/08/2017   Cardiomyopathy (HCC) 10/08/2017   Posterior vitreous detachment of both eyes 04/28/2016   Hyperlipidemia 07/18/2014   Nonexudative age-related macular degeneration 07/17/2014   Status post intraocular lens implant 07/17/2014   Pseudophakia 10/25/2013   Anxiety  10/11/2013   Mechanical complication of intraocular lens 08/12/2012   Nuclear sclerosis 08/12/2012   Posterior capsule opacification, right 08/12/2012   Macular degeneration 08/04/2011   Pituitary adenoma (HCC) 07/15/2011   Arteriosclerosis of coronary artery    Hypothyroidism    Home Medication(s) Prior to Admission medications   Medication Sig Start Date End Date Taking? Authorizing Provider  acetaminophen  (TYLENOL ) 500 MG tablet Take 2 tablets (1,000 mg total) by mouth every 8 (eight) hours for 4 days, THEN 2 tablets (1,000 mg total) every 8 (eight) hours as needed for up to 15 days. 09/01/23 09/20/23  Gonfa, Taye T, MD  Calcium  Carbonate-Vitamin D  (CALCIUM  + D PO) Take 2 tablets by mouth at bedtime.    [provider]  cholecalciferol  (VITAMIN D3) 25 MCG (1000 UT) tablet Take 1,000 Units by mouth daily after breakfast.    [provider]  clopidogrel  (PLAVIX ) 75 MG tablet Take 1 tablet by mouth once daily 03/03/23   Nahser, Lela Purple, MD  denosumab  (PROLIA ) 60 MG/ML SOSY injection Inject 60 mg into the skin every 6 (six) months.    [provider]  levothyroxine  (SYNTHROID , LEVOTHROID) 75 MCG tablet Take 75 mcg by mouth daily before breakfast.  11/30/17   [provider]  metoprolol  succinate (TOPROL -XL) 25 MG 24 hr tablet Take 0.5 tablets (12.5 mg total) by mouth daily. 09/01/23   Gonfa, Taye T, MD  multivitamin-lutein (OCUVITE-LUTEIN) CAPS capsule Take 1 capsule by mouth 2 (two) times a day.     [provider]  nitroGLYCERIN  (NITROSTAT ) 0.4 MG  SL tablet Place 1 tablet (0.4 mg total) under the tongue every 5 (five) minutes as needed. Patient taking differently: Place 0.4 mg under the tongue every 5 (five) minutes as needed for chest pain. 06/22/11   Nahser, Lela Purple, MD  ranolazine  (RANEXA ) 500 MG 12 hr tablet Take 1 tablet (500 mg total) by mouth 2 (two) times daily. 09/01/23   Gonfa, Taye T, MD  rosuvastatin  (CRESTOR ) 40 MG tablet Take 1 tablet (40  mg total) by mouth daily. 01/18/23   Nahser, Lela Purple, MD  venlafaxine  XR (EFFEXOR -XR) 150 MG 24 hr capsule Take 1 capsule (150 mg total) by mouth daily with breakfast. 02/19/23   Nahser, Lela Purple, MD  vitamin B-12 (CYANOCOBALAMIN ) 1000 MCG tablet Take 2,000 mcg by mouth daily.     [provider]                                                                                                                                    Past Surgical History Past Surgical History:  Procedure Laterality Date   APPENDECTOMY  1970s?   "ruptured"    BACK SURGERY     CATARACT EXTRACTION W/ INTRAOCULAR LENS  IMPLANT, BILATERAL Bilateral    CORONARY ANGIOPLASTY WITH STENT PLACEMENT  ~ 2009   "2 stents"   FRACTURE SURGERY     IR VERTEBROPLASTY CERV/THOR BX INC UNI/BIL INC/INJECT/IMAGING  02/07/2018   KNEE ARTHROSCOPY Right     torn meniscus   LEFT HEART CATH AND CORONARY ANGIOGRAPHY N/A 03/21/2020   Procedure: LEFT HEART CATH AND CORONARY ANGIOGRAPHY;  Surgeon: Avanell Leigh, MD;  Location: [facility];  Service: Cardiovascular;  Laterality: N/A;   LUMBAR LAMINECTOMY  1978   OVARIAN CYST SURGERY  1970s?   "ruptured"    RIGHT/LEFT HEART CATH AND CORONARY ANGIOGRAPHY N/A 10/08/2017   Procedure: RIGHT/LEFT HEART CATH AND CORONARY ANGIOGRAPHY;  Surgeon: Sammy Crisp, MD;  Location: [facility];  Service: Cardiovascular;  Laterality: N/A;   WRIST FRACTURE SURGERY Bilateral    "2 surgeries for 2 breaks on left side; 1 surgery for 1 break on right wrist"   Family History Family History  Problem Relation Age of Onset   Alzheimer's disease Mother    Heart attack Father    Aneurysm Sister     Social History Social History   Tobacco Use   Smoking status: Never   Smokeless tobacco: Never  Vaping Use   Vaping status: Never Used  Substance Use Topics   Alcohol use: Yes    Alcohol/week: 1.0 standard drink of alcohol    Types: 1 Glasses of wine per week   Drug use: Never    Allergies Patient has no known allergies.  Review of Systems Review of Systems  Cardiovascular:  Positive for chest pain.    Physical Exam Vital Signs  I have reviewed the triage vital signs BP (!) 99/58 (BP Location: Right  Arm)   Pulse 91   Temp 98.1 F (36.7 C)   Resp 13   SpO2 98%   Physical Exam Vitals and nursing note reviewed.  Constitutional:      General: She is not in acute distress.    Appearance: She is well-developed.  HENT:     Head: Normocephalic and atraumatic.  Eyes:     Conjunctiva/sclera: Conjunctivae normal.  Cardiovascular:     Rate and Rhythm: Normal rate and regular rhythm.     Heart sounds: No murmur heard. Pulmonary:     Effort: Pulmonary effort is normal. No respiratory distress.     Breath sounds: Normal breath sounds.  Chest:     Chest wall: Tenderness present.  Abdominal:     Palpations: Abdomen is soft.     Tenderness: There is no abdominal tenderness.  Musculoskeletal:        General: No swelling.     Cervical back: Neck supple.  Skin:    General: Skin is warm and dry.     Capillary Refill: Capillary refill takes less than 2 seconds.  Neurological:     Mental Status: She is alert.  Psychiatric:        Mood and Affect: Mood normal.     ED Results and Treatments Labs (all labs ordered are listed, but only abnormal results are displayed) Labs Reviewed  CBC WITH DIFFERENTIAL/PLATELET - Abnormal; Notable for the following components:      Result Value   RBC 3.37 (*)    Hemoglobin 11.1 (*)    HCT 33.7 (*)    Monocytes Absolute 1.2 (*)    All other components within normal limits  COMPREHENSIVE METABOLIC PANEL WITH GFR  BRAIN NATRIURETIC PEPTIDE  TROPONIN I (HIGH SENSITIVITY)                                                                                                                          Radiology No results found.  Pertinent labs & imaging results that were available during my care of the patient were reviewed by  me and considered in my medical decision making (see MDM for details).  Medications Ordered in ED Medications  lactated ringers bolus 1,000 mL (has no administration in time range)  lidocaine  (LIDODERM ) 5 % 1 patch (1 patch Transdermal Patch Applied 09/05/23 1836)  fentaNYL  (SUBLIMAZE ) injection 50 mcg (has no administration in time range)  ketorolac  (TORADOL ) 15 MG/ML injection 15 mg (15 mg Intravenous Given 09/05/23 1836)  Procedures Procedures  (including critical care time)  Medical Decision Making / ED Course   This patient presents to the ED for concern of chest pain, this involves an extensive number of treatment options, and is a complaint that carries with it a high risk of complications and morbidity.  The differential diagnosis includes ACS, Aortic Dissection, Pneumothorax, Pneumonia, Esophageal Rupture, PE, Tamponade/Pericardial Effusion, pericarditis, esophageal spasm, dysrhythmia, GERD, costochondritis.  MDM: Patient department for evaluation of chest pain.  Physical exam with reproducible chest pain over the rib line on the left but is otherwise unremarkable.  Patient does appear uncomfortable.  Laboratory evaluation with hemoglobin 11.1, creatinine 1.11, high-sensitivity troponin is 20, BNP 1134.  ECG nonischemic with left bundle branch block but Sgarbossa negative.  Chest x-ray unremarkable.  Given fluids and albumin with improvement of blood pressures.  Pain improving with pain medicine.  Pending delta troponin at time of signout.  Please see provider signout for continuation of workup.   Additional history obtained: -Additional history obtained from daughter -External records from outside source obtained and reviewed including: Chart review including previous notes, labs, imaging, consultation notes   Lab Tests: -I ordered, reviewed, and  interpreted labs.   The pertinent results include:   Labs Reviewed  CBC WITH DIFFERENTIAL/PLATELET - Abnormal; Notable for the following components:      Result Value   RBC 3.37 (*)    Hemoglobin 11.1 (*)    HCT 33.7 (*)    Monocytes Absolute 1.2 (*)    All other components within normal limits  COMPREHENSIVE METABOLIC PANEL WITH GFR  BRAIN NATRIURETIC PEPTIDE  TROPONIN I (HIGH SENSITIVITY)      EKG  Normal sinus rhythm, left bundle branch block    Imaging Studies ordered: I ordered imaging studies including chest x-ray I independently visualized and interpreted imaging. I agree with the radiologist interpretation   Medicines ordered and prescription drug management: Meds ordered this encounter  Medications   lactated ringers bolus 1,000 mL   lidocaine  (LIDODERM ) 5 % 1 patch   fentaNYL  (SUBLIMAZE ) injection 50 mcg   ketorolac  (TORADOL ) 15 MG/ML injection 15 mg    -I have reviewed the patients home medicines and have made adjustments as needed  Critical interventions none    Cardiac Monitoring: The patient was maintained on a cardiac monitor.  I personally viewed and interpreted the cardiac monitored which showed an underlying rhythm of: NSR  Social Determinants of Health:  Factors impacting patients care include: none   Reevaluation: After the interventions noted above, I reevaluated the patient and found that they have :improved  Co morbidities that complicate the patient evaluation  Past Medical History:  Diagnosis Date   Chronic HFrEF (heart failure with reduced ejection fraction) (HCC)    Coronary artery disease    PTCA/stenting of RCA 2005, moderate residual 70% stenosis of LAD stable by last cath 2021   Frequent PVCs    Hyperlipidemia    Hypothyroidism    LBBB (left bundle branch block)    Mild carotid artery disease (HCC)    Osteoporosis    Syncope       Dispostion: I considered admission for this patient, and disposition pending completion  of lab evaluation.  Please see provider signout for continuation of workup.     Final Clinical Impression(s) / ED Diagnoses Final diagnoses:  None     @PCDICTATION @    Karlyn Overman, MD 09/05/23 2104    Karlyn Overman, MD 09/05/23 2104

## 2023-09-05 NOTE — Consult Note (Signed)
 Cardiology Consultation:   Patient ID: Susan [name] MRN: [id number]; DOB: [birth year]  Admit date: 09/05/2023 Date of Consult: 09/05/2023  Primary Care Provider: Aldo Hun, MD [facility] Cardiologist: Ahmad Alert, MD  [facility] Electrophysiologist:  None   Patient Profile:   Susan [name] is a 89 y.o. female with CAD s/p RCA PCI (2005), residual pLAD stenosis, HFrEF, LBBB, frequent PVCs, CAD s/p bilateral ICA (2019), pituitary adenoma, mild-mod AI, mod MR and HLD who is being seen today for the evaluation of CP at the request of Dr. Jeannie Milo.  History of Present Illness:   Ms. Bargerstock presents with recurrent chest pain that started 05/04 AM.  She was evaluated by [facility] and given 750 cc NS en route for BP 100/50. She had already taken ASA 324 mg PO x1 prior to EMS arrival. EMS reported some lethargy on arrival.   She was just discharged on 09/01/23 for similar sx which were thought to be MSK in nature as they were reproducible on exam.   She was initially evaluated 08/30/23 for similar sx with temporary relief with SL NG.  ECG without any signs of ischemia and hsT mildly elevated but flat (24->25->25). BNP above prior baseline (859 from 200s). She was started on ranexa  and toprol  XL although it was felt the chest pain was likely non-anginal. She has been evaluated by EP (Dr. Rodolfo Clan, 11/20/21) with regards to her LBBB (at the time QRS <130) and reduced LVEF (30-35% in 2023) and was thought not to have significant benefit from consideration of CRT or ICD. Her PVCs were also infrequent on prior ECGs and thought to not be a significant contributor to her drop in LVEF. Recent OP monitor with only 3.1% isolated ventricular ectopy.   On arrival to the ED VS were: BP 99/58, P 91, RR 13, O2 98%/RA, T 98.1.  Evaluation with sCr 1.1 (mild AKI), hsT 20->19, proBNP 1134 (bl 200s, a week ago 859.  During my discussion with Ms. Rhatigan she reports being remarkably independent up until around 8  weeks ago.  She was walking over 2 miles daily and still was taking care of all of her ADLs.  She noticed lightheadedness and presyncopal symptoms 2 months ago with associated chest pain that was relieved with nitroglycerin .  These sometimes did occur towards the end of her walks but were not reliably reproduced with exertional activity.  During this admission however her symptoms were different and predominantly midsternal 5/10 chest discomfort that started 05/04 at 1200 and lasted until she went to bed later that night it was still occurring when she woke up 05/05 AM.  This prompted her to seek evaluation in the emergency department.  05/05 AM she developed AF/RVR with significant symptoms of dyspnea and shortness of breath at rest requiring her to sit upright.  She had no associated chest discomfort with atrial fibrillation. She had RVR to the 160s. She was given metop tartrate 5 mg IV x1 and amio 150 mg IV x1 followed by amio gtt.   Past Medical History:  Diagnosis Date   Chronic HFrEF (heart failure with reduced ejection fraction) (HCC)    Coronary artery disease    PTCA/stenting of RCA 2005, moderate residual 70% stenosis of LAD stable by last cath 2021   Frequent PVCs    Hyperlipidemia    Hypothyroidism    LBBB (left bundle branch block)    Mild carotid artery disease (HCC)    Osteoporosis    Syncope    Past  Surgical History:  Procedure Laterality Date   APPENDECTOMY  1970s?   "ruptured"    BACK SURGERY     CATARACT EXTRACTION W/ INTRAOCULAR LENS  IMPLANT, BILATERAL Bilateral    CORONARY ANGIOPLASTY WITH STENT PLACEMENT  ~ 2009   "2 stents"   FRACTURE SURGERY     IR VERTEBROPLASTY CERV/THOR BX INC UNI/BIL INC/INJECT/IMAGING  02/07/2018   KNEE ARTHROSCOPY Right     torn meniscus   LEFT HEART CATH AND CORONARY ANGIOGRAPHY N/A 03/21/2020   Procedure: LEFT HEART CATH AND CORONARY ANGIOGRAPHY;  Surgeon: Avanell Leigh, MD;  Location: [facility];  Service: Cardiovascular;   Laterality: N/A;   LUMBAR LAMINECTOMY  1978   OVARIAN CYST SURGERY  1970s?   "ruptured"    RIGHT/LEFT HEART CATH AND CORONARY ANGIOGRAPHY N/A 10/08/2017   Procedure: RIGHT/LEFT HEART CATH AND CORONARY ANGIOGRAPHY;  Surgeon: Sammy Crisp, MD;  Location: [facility];  Service: Cardiovascular;  Laterality: N/A;   WRIST FRACTURE SURGERY Bilateral    "2 surgeries for 2 breaks on left side; 1 surgery for 1 break on right wrist"    Home Medications:  Prior to Admission medications   Medication Sig Start Date End Date Taking? Authorizing Provider  acetaminophen  (TYLENOL ) 500 MG tablet Take 2 tablets (1,000 mg total) by mouth every 8 (eight) hours for 4 days, THEN 2 tablets (1,000 mg total) every 8 (eight) hours as needed for up to 15 days. 09/01/23 09/20/23 Yes Gonfa, Taye T, MD  Calcium  Carbonate-Vitamin D  (CALCIUM  + D PO) Take 1 tablet by mouth 2 (two) times daily.   Yes [provider]  cholecalciferol  (VITAMIN D3) 25 MCG (1000 UT) tablet Take 1,000 Units by mouth daily after breakfast.   Yes [provider]  clopidogrel  (PLAVIX ) 75 MG tablet Take 1 tablet by mouth once daily 03/03/23  Yes Nahser, Lela Purple, MD  denosumab  (PROLIA ) 60 MG/ML SOSY injection Inject 60 mg into the skin every 6 (six) months.   Yes [provider]  levothyroxine  (SYNTHROID , LEVOTHROID) 75 MCG tablet Take 75 mcg by mouth daily before breakfast.  11/30/17  Yes [provider]  metoprolol  succinate (TOPROL -XL) 25 MG 24 hr tablet Take 0.5 tablets (12.5 mg total) by mouth daily. 09/01/23  Yes Gonfa, Taye T, MD  multivitamin-lutein (OCUVITE-LUTEIN) CAPS capsule Take 1 capsule by mouth 2 (two) times a day.    Yes [provider]  nitroGLYCERIN  (NITROSTAT ) 0.4 MG SL tablet Place 1 tablet (0.4 mg total) under the tongue every 5 (five) minutes as needed. Patient taking differently: Place 0.4 mg under the tongue every 5 (five) minutes as needed for chest pain. 06/22/11  Yes Nahser,  Lela Purple, MD  ranolazine  (RANEXA ) 500 MG 12 hr tablet Take 1 tablet (500 mg total) by mouth 2 (two) times daily. 09/01/23  Yes Gonfa, Taye T, MD  rosuvastatin  (CRESTOR ) 40 MG tablet Take 1 tablet (40 mg total) by mouth daily. 01/18/23  Yes Nahser, Lela Purple, MD  venlafaxine  XR (EFFEXOR -XR) 150 MG 24 hr capsule Take 1 capsule (150 mg total) by mouth daily with breakfast. 02/19/23  Yes Nahser, Lela Purple, MD  vitamin B-12 (CYANOCOBALAMIN ) 1000 MCG tablet Take 2,000 mcg by mouth daily.    Yes [provider]   Inpatient Medications: Scheduled Meds:  lidocaine   1 patch Transdermal Q24H   Continuous Infusions:  PRN Meds:  Allergies:   No Known Allergies  Social History:   Social History   Socioeconomic History   Marital  status: Widowed    Spouse name: Not on file   Number of children: Not on file   Years of education: Not on file   Highest education level: Not on file  Occupational History   Not on file  Tobacco Use   Smoking status: Never   Smokeless tobacco: Never  Vaping Use   Vaping status: Never Used  Substance and Sexual Activity   Alcohol use: Yes    Alcohol/week: 1.0 standard drink of alcohol    Types: 1 Glasses of wine per week   Drug use: Never   Sexual activity: Not on file  Other Topics Concern   Not on file  Social History Narrative   Not on file   Social Drivers of Health   Financial Resource Strain: Not on file  Food Insecurity: No Food Insecurity (08/31/2023)   Hunger Vital Sign    Worried About Running Out of Food in the Last Year: Never true    Ran Out of Food in the Last Year: Never true  Transportation Needs: No Transportation Needs (08/31/2023)   PRAPARE - Administrator, Civil Service (Medical): No    Lack of Transportation (Non-Medical): No  Physical Activity: Not on file  Stress: Not on file  Social Connections: Moderately Integrated (08/31/2023)   Social Connection and Isolation Panel [NHANES]    Frequency of Communication with  Friends and Family: More than three times a week    Frequency of Social Gatherings with Friends and Family: More than three times a week    Attends Religious Services: More than 4 times per year    Active Member of [employer] or Organizations: Yes    Attends Banker Meetings: More than 4 times per year    Marital Status: Widowed  Intimate Partner Violence: Not At Risk (08/31/2023)   Humiliation, Afraid, Rape, and Kick questionnaire    Fear of Current or Ex-Partner: No    Emotionally Abused: No    Physically Abused: No    Sexually Abused: No    Family History:    Family History  Problem Relation Age of Onset   Alzheimer's disease Mother    Heart attack Father    Aneurysm Sister     ROS:  Review of Systems: [y] = yes, [ ]  = no      General: Weight gain [ ] ; Weight loss [ ] ; Anorexia [ ] ; Fatigue [x] ; Fever [ ] ; Chills [ ] ; Weakness [ ]    Cardiac: Chest pain/pressure [x] ; Resting SOB [ ] ; Exertional SOB [ ] ; Orthopnea [ ] ; Pedal Edema [ ] ; Palpitations [ ] ; Syncope [ ] ; Presyncope [ ] ; Paroxysmal nocturnal dyspnea [ ]    Pulmonary: Cough [ ] ; Wheezing [ ] ; Hemoptysis [ ] ; Sputum [ ] ; Snoring [ ]    GI: Vomiting [ ] ; Dysphagia [ ] ; Melena [ ] ; Hematochezia [ ] ; Heartburn [ ] ; Abdominal pain [ ] ; Constipation [ ] ; Diarrhea [ ] ; BRBPR [ ]    GU: Hematuria [ ] ; Dysuria [ ] ; Nocturia [ ]  Vascular: Pain in legs with walking [ ] ; Pain in feet with lying flat [ ] ; Non-healing sores [ ] ; Stroke [ ] ; TIA [ ] ; Slurred speech [ ] ;   Neuro: Headaches [ ] ; Vertigo [ ] ; Seizures [ ] ; Paresthesias [ ] ;Blurred vision [ ] ; Diplopia [ ] ; Vision changes [ ]    Ortho/Skin: Arthritis [ ] ; Joint pain [ ] ; Muscle pain [ ] ; Joint swelling [ ] ; Back Pain [ ] ; Rash [ ]   Psych: Depression [ ] ; Anxiety [ ]    Heme: Bleeding problems [ ] ; Clotting disorders [ ] ; Anemia [ ]    Endocrine: Diabetes [ ] ; Thyroid  dysfunction [ ]    Physical Exam/Data:   Vitals:   09/05/23 1930 09/05/23 2000 09/05/23 2015 09/05/23  2030  BP: (!) 85/45 93/64 (!) 90/57 (!) 91/51  Pulse:   84 64  Resp: 17 (!) 21 15 20   Temp:      SpO2: 97% 97% 99% 95%    Intake/Output Summary (Last 24 hours) at 09/05/2023 2207 Last data filed at 09/05/2023 2144 Gross per 24 hour  Intake 1100 ml  Output --  Net 1100 ml      09/01/2023    3:55 AM 08/31/2023    3:10 PM 08/30/2023    9:28 PM  Last 3 Weights  Weight (lbs) 127 lb 13.9 oz 129 lb 13.6 oz 132 lb 4.4 oz  Weight (kg) 58 kg 58.9 kg 60 kg     There is no height or weight on file to calculate BMI.   General:  Well nourished, well developed, in no acute distress HEENT: normal Neck: no JVD Vascular: No carotid bruits; FA pulses 2+ bilaterally without bruits  Cardiac:  normal S1, S2; RRR; no murmur  Lungs:  clear to auscultation bilaterally, no wheezing, rhonchi or rales  Ext: no edema Musculoskeletal:  No deformities, BUE and BLE strength normal and equal Skin: warm and dry  Psych:  Normal affect   EKG:  The EKG was personally reviewed and demonstrates:  09/06/23 (02:58:44): AF/RVR 155, QRS 132, QT/Qtc 283/455 09/05/23 (18:10:25): NSR 95, PR 153, QRS 137, QT/Qtc 397/500 Telemetry:  Telemetry was personally reviewed and demonstrates: NSR->AF/RVR  Relevant CV Studies:  Holter monitor Result date: 08/31/23   Predominant rhythm is sinus rhythm.   She had 2 episodes of nonsustained ventricular tachycardia.  The fastest interval lasted 8 beats with a maximal heart rate of 207.  The longest episode lasted 7 beats with an average heart rate of 173.  Neither of these were symptomatic.   She had 21 beats of supraventricular tachycardia (nonsustained).  The fastest and longest interval lasted 9 beats with a maximum heart rate of 185 and an average heart rate of 148.   Occasional premature ventricular contractions, rare premature atrial contractions  TTE Result date: 08/06/23  1. Left ventricular ejection fraction, by estimation, is 25 to 30%. The  left ventricle has severely  decreased function. The left ventricle  demonstrates global hypokinesis. There is mild left ventricular  hypertrophy of the basal-septal segment. Left  ventricular diastolic parameters are indeterminate.   2. Right ventricular systolic function is normal. The right ventricular  size is normal.   3. Left atrial size was mildly dilated.   4. The mitral valve is normal in structure. Moderate mitral valve  regurgitation. No evidence of mitral stenosis.   5. The aortic valve is tricuspid. There is moderate calcification of the  aortic valve. There is moderate thickening of the aortic valve. Aortic  valve regurgitation is moderate. Aortic valve sclerosis is present, with  no evidence of aortic valve  stenosis. Aortic regurgitation PHT measures 343 msec.   6. The inferior vena cava is normal in size with greater than 50%  respiratory variability, suggesting right atrial pressure of 3 mmHg.   SPECT Result date: 07/30/23   Findings are consistent with no ischemia. The study is high risk.   No ST deviation was noted.   LV perfusion is abnormal.  There is no evidence of ischemia. There is no evidence of infarction. Defect 1: There is a small defect with moderate reduction in uptake present in the apical anteroseptal location(s) that is fixed. There is abnormal wall motion in the defect area. Consistent with artifact caused by left bundle branch block.   Left ventricular function is abnormal. Global function is severely reduced. End diastolic cavity size is severely enlarged. End systolic cavity size is severely enlarged.   Prior study not available for comparison.  Coronary angiography Result date: 03/21/20 Previously placed Prox RCA to Mid RCA stent (unknown type) is widely patent. Previously placed Dist RCA stent (unknown type) is widely patent. Mid LAD lesion is 70% stenosed.  Laboratory Data:  High Sensitivity Troponin:   Recent Labs  Lab 08/30/23 2138 08/30/23 2317 08/31/23 0443  09/05/23 1818 09/05/23 2016  TROPONINIHS 24* 25* 25* 20* 19*     Chemistry Recent Labs  Lab 08/31/23 1005 09/01/23 0251 09/05/23 1818  NA 140 133* 136  K 2.9* 4.9 3.5  CL 112* 106 109  CO2 18* 20* 19*  GLUCOSE 77 122* 124*  BUN 10 14 14   CREATININE 0.77 1.09* 1.11*  CALCIUM  7.1* 8.8* 8.3*  GFRNONAA >60 49* 48*  ANIONGAP 10 7 8     Recent Labs  Lab 08/31/23 1005 09/01/23 0251 09/05/23 1818  PROT  --   --  6.0*  ALBUMIN 2.7* 3.4* 2.8*  AST  --   --  19  ALT  --   --  15  ALKPHOS  --   --  34*  BILITOT  --   --  0.5   Hematology Recent Labs  Lab 08/30/23 2138 09/01/23 0251 09/05/23 1818  WBC 9.5 10.4 9.9  RBC 3.90 3.99 3.37*  HGB 12.8 12.9 11.1*  HCT 38.3 38.7 33.7*  MCV 98.2 97.0 100.0  MCH 32.8 32.3 32.9  MCHC 33.4 33.3 32.9  RDW 14.6 14.5 14.7  PLT 205 195 220   BNP Recent Labs  Lab 08/30/23 2137 09/05/23 1818  BNP 859.6* 1,134.1*    DDimer No results for input(s): "DDIMER" in the last 168 hours.  Radiology/Studies:  DG Chest Portable 1 View Result Date: 09/05/2023 CLINICAL DATA:  Chest pain EXAM: PORTABLE CHEST 1 VIEW COMPARISON:  Chest x-ray 08/30/2023 FINDINGS: The heart is enlarged. There is no focal lung infiltrate, pleural effusion or pneumothorax. No acute fractures are seen. Vertebroplasty changes are again seen in the thoracic region. IMPRESSION: Cardiomegaly. No active disease. Electronically Signed   By: Tyron Gallon M.D.   On: 09/05/2023 19:29   TIMI Risk Score for Unstable Angina or Non-ST Elevation MI:   The patient's TIMI risk score is 5, which indicates a 26% risk of all cause mortality, new or recurrent myocardial infarction or need for urgent revascularization in the next 14 days.{  Assessment and Plan:  LAKESHIA CURTI is a 89 y.o. female with CAD s/p RCA PCI (2005), residual pLAD stenosis, HFrEF, LBBB, frequent PVCs, CAD s/p bilateral ICA (2019), pituitary adenoma, mild-mod AI, mod MR and HLD who is being seen for chest pain and  new onset AF/RVR  Chest pain  CAD s/p RCA PCI pLAD obstruction without intervention  She has had extensive evaluation for ischemia and unfortunately has not tolerated Imdur  2/2 orthostasis and ranexa  hasn't improved sx. She continues on low dose toprol  XL and plavix . She has known pLAD stenosis which I presume has likely worsened with time (70% stenosed in 03/2020). We discussed different options for  management of her CAD including both conservative and aggressive approaches. She preferences proceeding with repeat coronary angiography if there is a reasonable chance that it could provide some benefit. She understands that this may not be the only thing contributing to her sx. We will consider cath but defer for now with symptomatic new onset AF/RVR until rates are better controlled. Have not started yet on OAC (28F, HFrEF, vascular dz, C2V 5) but plan to after possible cath.  - resume OP plavix  75 mg daily - s/p ASA 324 mg PO x1 - hold ASA   AF/RVR New onset this hospitalization. She was very sx with AF/RVR however sx mainly SOB without any chest discomfort. She had never felt this before. I don't think this correlates well with her recent chest sx. S/p metop tartrate 5 mg IV followed by amio 150 mg IV with bolus. Will start OAC once we sort out plan for her known obstructive CAD and current presentation that seems c/w possible angina.  - start apixaban 2.5 mg bid (28F, sCr 1.1, 58 kg)   HFrEF Her LVEF has been in decline since 09/2017. Likely multifactorial however CAD is likely contributor. PVCs are low burden (3.1%) so doubt they are a large contributor. They are predominately unifocal with LBBB morphology and inferior axis (possible RVOT).   PVCs - starting amio for AF/RVR so this should control  Bilateral ICA - OP f/u to reschedule   Pituitary adenoma  - nothing to do   HLD - start atorva 80 mg at bedtime     For questions or updates, please contact [facility] Please  consult [email or web address] for contact info under   Signed, Katrine Parody, MD  09/05/2023 10:07 PM

## 2023-09-06 ENCOUNTER — Other Ambulatory Visit: Payer: Self-pay

## 2023-09-06 ENCOUNTER — Other Ambulatory Visit (HOSPITAL_COMMUNITY)

## 2023-09-06 ENCOUNTER — Other Ambulatory Visit (HOSPITAL_COMMUNITY): Payer: Self-pay

## 2023-09-06 ENCOUNTER — Telehealth (HOSPITAL_COMMUNITY): Payer: Self-pay | Admitting: Pharmacy Technician

## 2023-09-06 ENCOUNTER — Inpatient Hospital Stay (HOSPITAL_COMMUNITY)

## 2023-09-06 LAB — GLUCOSE, CAPILLARY: Glucose-Capillary: 187 mg/dL — ABNORMAL HIGH (ref 70–99)

## 2023-09-06 LAB — HEPARIN LEVEL (UNFRACTIONATED): Heparin Unfractionated: 0.37 [IU]/mL (ref 0.30–0.70)

## 2023-09-06 LAB — MRSA NEXT GEN BY PCR, NASAL: MRSA by PCR Next Gen: NOT DETECTED

## 2023-09-06 MED ORDER — FUROSEMIDE 10 MG/ML IJ SOLN
40.0000 mg | Freq: Two times a day (BID) | INTRAMUSCULAR | Status: DC
  Administered 2023-09-06: 40 mg via INTRAVENOUS
  Filled 2023-09-06: qty 4

## 2023-09-06 MED ORDER — FUROSEMIDE 10 MG/ML IJ SOLN
40.0000 mg | INTRAMUSCULAR | Status: AC
  Administered 2023-09-06: 40 mg via INTRAVENOUS
  Filled 2023-09-06: qty 4

## 2023-09-06 MED ORDER — HEPARIN (PORCINE) 25000 UT/250ML-% IV SOLN
750.0000 [IU]/h | INTRAVENOUS | Status: DC
  Administered 2023-09-06: 750 [IU]/h via INTRAVENOUS
  Filled 2023-09-06: qty 250

## 2023-09-06 MED ORDER — HEPARIN BOLUS VIA INFUSION
3000.0000 [IU] | Freq: Once | INTRAVENOUS | Status: AC
  Administered 2023-09-06: 3000 [IU] via INTRAVENOUS
  Filled 2023-09-06: qty 3000

## 2023-09-06 MED ORDER — AMIODARONE HCL IN DEXTROSE 360-4.14 MG/200ML-% IV SOLN
30.0000 mg/h | INTRAVENOUS | Status: DC
  Administered 2023-09-06 (×3): 30 mg/h via INTRAVENOUS
  Filled 2023-09-06 (×3): qty 200

## 2023-09-06 MED ORDER — METOPROLOL TARTRATE 5 MG/5ML IV SOLN
INTRAVENOUS | Status: AC
  Administered 2023-09-06: 5 mg
  Filled 2023-09-06: qty 5

## 2023-09-06 MED ORDER — AMIODARONE LOAD VIA INFUSION
150.0000 mg | Freq: Once | INTRAVENOUS | Status: AC
  Administered 2023-09-06: 150 mg via INTRAVENOUS
  Filled 2023-09-06: qty 83.34

## 2023-09-06 MED ORDER — ASPIRIN 81 MG PO CHEW
81.0000 mg | CHEWABLE_TABLET | Freq: Every day | ORAL | Status: DC
  Administered 2023-09-06 – 2023-09-09 (×3): 81 mg via ORAL
  Filled 2023-09-06 (×5): qty 1

## 2023-09-06 MED ORDER — AMIODARONE HCL IN DEXTROSE 360-4.14 MG/200ML-% IV SOLN
60.0000 mg/h | INTRAVENOUS | Status: DC
  Administered 2023-09-06: 60 mg/h via INTRAVENOUS
  Filled 2023-09-06: qty 200

## 2023-09-06 MED ORDER — AMIODARONE IV BOLUS ONLY 150 MG/100ML
INTRAVENOUS | Status: AC
Start: 2023-09-06 — End: 2023-09-06
  Administered 2023-09-06: 150 mg
  Filled 2023-09-06: qty 100

## 2023-09-06 MED ORDER — CHLORHEXIDINE GLUCONATE CLOTH 2 % EX PADS
6.0000 | MEDICATED_PAD | Freq: Every day | CUTANEOUS | Status: DC
Start: 2023-09-06 — End: 2023-09-09
  Administered 2023-09-06 – 2023-09-09 (×4): 6 via TOPICAL

## 2023-09-06 NOTE — Progress Notes (Signed)
 PHARMACY - ANTICOAGULATION CONSULT NOTE  Pharmacy Consult for heparin  Indication: atrial fibrillation  No Known Allergies  Patient Measurements:    Vital Signs: Temp: 99.1 F (37.3 C) (05/05 1343) Temp Source: Rectal (05/05 1343) BP: 107/71 (05/05 1500) Pulse Rate: 87 (05/05 1500)  Labs: Recent Labs    09/05/23 1818 09/05/23 2016 09/06/23 1412  HGB 11.1*  --   --   HCT 33.7*  --   --   PLT 220  --   --   HEPARINUNFRC  --   --  0.37  CREATININE 1.11*  --   --   TROPONINIHS 20* 19*  --     Estimated Creatinine Clearance: 29.5 mL/min (A) (by C-G formula based on SCr of 1.11 mg/dL (H)).   Medical History: Past Medical History:  Diagnosis Date   Chronic HFrEF (heart failure with reduced ejection fraction) (HCC)    Coronary artery disease    PTCA/stenting of RCA 2005, moderate residual 70% stenosis of LAD stable by last cath 2021   Frequent PVCs    Hyperlipidemia    Hypothyroidism    LBBB (left bundle branch block)    Mild carotid artery disease (HCC)    Osteoporosis    Syncope     Medications:  No current facility-administered medications on file prior to encounter.   Current Outpatient Medications on File Prior to Encounter  Medication Sig Dispense Refill   acetaminophen  (TYLENOL ) 500 MG tablet Take 2 tablets (1,000 mg total) by mouth every 8 (eight) hours for 4 days, THEN 2 tablets (1,000 mg total) every 8 (eight) hours as needed for up to 15 days.     Calcium  Carbonate-Vitamin D  (CALCIUM  + D PO) Take 1 tablet by mouth 2 (two) times daily.     cholecalciferol  (VITAMIN D3) 25 MCG (1000 UT) tablet Take 1,000 Units by mouth daily after breakfast.     clopidogrel  (PLAVIX ) 75 MG tablet Take 1 tablet by mouth once daily 90 tablet 2   denosumab  (PROLIA ) 60 MG/ML SOSY injection Inject 60 mg into the skin every 6 (six) months.     levothyroxine  (SYNTHROID , LEVOTHROID) 75 MCG tablet Take 75 mcg by mouth daily before breakfast.   0   metoprolol  succinate (TOPROL -XL) 25  MG 24 hr tablet Take 0.5 tablets (12.5 mg total) by mouth daily. 45 tablet 0   multivitamin-lutein (OCUVITE-LUTEIN) CAPS capsule Take 1 capsule by mouth 2 (two) times a day.      nitroGLYCERIN  (NITROSTAT ) 0.4 MG SL tablet Place 1 tablet (0.4 mg total) under the tongue every 5 (five) minutes as needed. (Patient taking differently: Place 0.4 mg under the tongue every 5 (five) minutes as needed for chest pain.) 25 tablet 3   ranolazine  (RANEXA ) 500 MG 12 hr tablet Take 1 tablet (500 mg total) by mouth 2 (two) times daily. 180 tablet 0   rosuvastatin  (CRESTOR ) 40 MG tablet Take 1 tablet (40 mg total) by mouth daily. 90 tablet 3   venlafaxine  XR (EFFEXOR -XR) 150 MG 24 hr capsule Take 1 capsule (150 mg total) by mouth daily with breakfast. 90 capsule 2   vitamin B-12 (CYANOCOBALAMIN ) 1000 MCG tablet Take 2,000 mcg by mouth daily.        Assessment: 89 y.o. female with new onset Afib for heparin  Goal of Therapy:  Heparin  level 0.3-0.7 units/ml Monitor platelets by anticoagulation protocol: Yes  Initial heparin  level 0.37, therapeutic on 750 units/hr.  No issues noted.   Plan:  Continue heparin  IV 750 units/hr Daily heparin   level, CBC F/u to DOAC after cath tomorrow - will need education prior to discharge (DOACs = $47/mo)  Cecillia Cogan, PharmD Clinical Pharmacist 09/06/2023  3:48 PM

## 2023-09-06 NOTE — Progress Notes (Signed)
 Checked on her and still very SOB while in AF. This is much different than prior to AF onset when she was able to carry on full conversation while laying supine. Would continue amio load, if she feels better in rate controlled AF and/or chemically cardioverts could then consider evaluation with coronary angio. For now wouldn't be able to lay flat. Will start heparin  gtt until we see which trajectory she goes, may need to cardiovert tomorrow.

## 2023-09-06 NOTE — ED Notes (Signed)
 Pt HR sustained in the 150s, SOB reported. Jolan Natal MD notified, currently at bedside. New orders placed by MD.

## 2023-09-06 NOTE — Progress Notes (Signed)
 PHARMACY - ANTICOAGULATION CONSULT NOTE  Pharmacy Consult for heparin  Indication: atrial fibrillation  No Known Allergies  Patient Measurements:    Vital Signs: Temp: 97.8 F (36.6 C) (05/05 0350) Temp Source: Oral (05/04 2245) BP: 99/74 (05/05 0500) Pulse Rate: 85 (05/05 0500)  Labs: Recent Labs    09/05/23 1818 09/05/23 2016  HGB 11.1*  --   HCT 33.7*  --   PLT 220  --   CREATININE 1.11*  --   TROPONINIHS 20* 19*    Estimated Creatinine Clearance: 29.5 mL/min (A) (by C-G formula based on SCr of 1.11 mg/dL (H)).   Medical History: Past Medical History:  Diagnosis Date   Chronic HFrEF (heart failure with reduced ejection fraction) (HCC)    Coronary artery disease    PTCA/stenting of RCA 2005, moderate residual 70% stenosis of LAD stable by last cath 2021   Frequent PVCs    Hyperlipidemia    Hypothyroidism    LBBB (left bundle branch block)    Mild carotid artery disease (HCC)    Osteoporosis    Syncope     Medications:  No current facility-administered medications on file prior to encounter.   Current Outpatient Medications on File Prior to Encounter  Medication Sig Dispense Refill   acetaminophen  (TYLENOL ) 500 MG tablet Take 2 tablets (1,000 mg total) by mouth every 8 (eight) hours for 4 days, THEN 2 tablets (1,000 mg total) every 8 (eight) hours as needed for up to 15 days.     Calcium  Carbonate-Vitamin D  (CALCIUM  + D PO) Take 1 tablet by mouth 2 (two) times daily.     cholecalciferol  (VITAMIN D3) 25 MCG (1000 UT) tablet Take 1,000 Units by mouth daily after breakfast.     clopidogrel  (PLAVIX ) 75 MG tablet Take 1 tablet by mouth once daily 90 tablet 2   denosumab  (PROLIA ) 60 MG/ML SOSY injection Inject 60 mg into the skin every 6 (six) months.     levothyroxine  (SYNTHROID , LEVOTHROID) 75 MCG tablet Take 75 mcg by mouth daily before breakfast.   0   metoprolol  succinate (TOPROL -XL) 25 MG 24 hr tablet Take 0.5 tablets (12.5 mg total) by mouth daily. 45 tablet  0   multivitamin-lutein (OCUVITE-LUTEIN) CAPS capsule Take 1 capsule by mouth 2 (two) times a day.      nitroGLYCERIN  (NITROSTAT ) 0.4 MG SL tablet Place 1 tablet (0.4 mg total) under the tongue every 5 (five) minutes as needed. (Patient taking differently: Place 0.4 mg under the tongue every 5 (five) minutes as needed for chest pain.) 25 tablet 3   ranolazine  (RANEXA ) 500 MG 12 hr tablet Take 1 tablet (500 mg total) by mouth 2 (two) times daily. 180 tablet 0   rosuvastatin  (CRESTOR ) 40 MG tablet Take 1 tablet (40 mg total) by mouth daily. 90 tablet 3   venlafaxine  XR (EFFEXOR -XR) 150 MG 24 hr capsule Take 1 capsule (150 mg total) by mouth daily with breakfast. 90 capsule 2   vitamin B-12 (CYANOCOBALAMIN ) 1000 MCG tablet Take 2,000 mcg by mouth daily.        Assessment: 89 y.o. female with new onset Afib for heparin  Goal of Therapy:  Heparin  level 0.3-0.7 units/ml Monitor platelets by anticoagulation protocol: Yes   Plan:  Heparin  3000 units IV bolus, then start heparin  750 units/hr Check heparin  level in 8 hours.   Carlota Chestnut 09/06/2023,7:00 AM

## 2023-09-06 NOTE — Progress Notes (Signed)
 Patient not alert enough to complete admission assessment. Will address at a later time.

## 2023-09-06 NOTE — Progress Notes (Signed)
 Patient vitals were much improved after coming to unit.  Used BSC at patient request.  Did fine with ambulation and having BM but while shifting around in bed became light headed and also most passed out.  Vitals did not change outside normal parameters.  Patient recovered and is interactive with myself and family.

## 2023-09-06 NOTE — ED Notes (Signed)
Ordered hospital bed for patient

## 2023-09-06 NOTE — Progress Notes (Signed)
 Rounding Note    Patient Name: Susan [name] Date of Encounter: 09/06/2023  [facility] Cardiologist: Ahmad Alert, MD   Subjective   No chest pain this am. Some dyspnea.  Converted to sinus on amiodarone drip  Inpatient Medications    Scheduled Meds:  aspirin   81 mg Oral Daily   clopidogrel   75 mg Oral Daily   levothyroxine   75 mcg Oral QAC breakfast   lidocaine   1 patch Transdermal Q24H   metoprolol  succinate  12.5 mg Oral Daily   ranolazine   500 mg Oral BID   rosuvastatin   40 mg Oral Daily   venlafaxine  XR  150 mg Oral Q breakfast   Continuous Infusions:  amiodarone     heparin  750 Units/hr (09/06/23 0754)   PRN Meds:    Vital Signs    Vitals:   09/06/23 0630 09/06/23 0645 09/06/23 0700 09/06/23 0731  BP: 91/61 96/69    Pulse: 92 93 86   Resp: (!) 29 (!) 34 (!) 26   Temp:    97.8 F (36.6 C)  TempSrc:    Oral  SpO2: 99% 99% 99%     Intake/Output Summary (Last 24 hours) at 09/06/2023 0915 Last data filed at 09/05/2023 2144 Gross per 24 hour  Intake 1100 ml  Output --  Net 1100 ml      09/01/2023    3:55 AM 08/31/2023    3:10 PM 08/30/2023    9:28 PM  Last 3 Weights  Weight (lbs) 127 lb 13.9 oz 129 lb 13.6 oz 132 lb 4.4 oz  Weight (kg) 58 kg 58.9 kg 60 kg      Telemetry    Sinus - Personally Reviewed  ECG    Atrial fib, LBBB - Personally Reviewed  Physical Exam   GEN: No acute distress.   Neck: No JVD Cardiac: RRR, no murmurs, rubs, or gallops.  Respiratory: Clear to auscultation bilaterally. GI: Soft, nontender, non-distended  MS: No edema; No deformity. Neuro:  Nonfocal  Psych: Normal affect   Labs    High Sensitivity Troponin:   Recent Labs  Lab 08/30/23 2138 08/30/23 2317 08/31/23 0443 09/05/23 1818 09/05/23 2016  TROPONINIHS 24* 25* 25* 20* 19*     Chemistry Recent Labs  Lab 08/31/23 1005 09/01/23 0251 09/05/23 1818  NA 140 133* 136  K 2.9* 4.9 3.5  CL 112* 106 109  CO2 18* 20* 19*  GLUCOSE 77 122*  124*  BUN 10 14 14   CREATININE 0.77 1.09* 1.11*  CALCIUM  7.1* 8.8* 8.3*  MG 1.8 2.3  --   PROT  --   --  6.0*  ALBUMIN 2.7* 3.4* 2.8*  AST  --   --  19  ALT  --   --  15  ALKPHOS  --   --  34*  BILITOT  --   --  0.5  GFRNONAA >60 49* 48*  ANIONGAP 10 7 8     Lipids  Recent Labs  Lab 08/31/23 0443  CHOL 122  TRIG 71  HDL 56  LDLCALC 52  CHOLHDL 2.2    Hematology Recent Labs  Lab 08/30/23 2138 09/01/23 0251 09/05/23 1818  WBC 9.5 10.4 9.9  RBC 3.90 3.99 3.37*  HGB 12.8 12.9 11.1*  HCT 38.3 38.7 33.7*  MCV 98.2 97.0 100.0  MCH 32.8 32.3 32.9  MCHC 33.4 33.3 32.9  RDW 14.6 14.5 14.7  PLT 205 195 220   Thyroid  No results for input(s): "TSH", "FREET4" in the last  168 hours.  BNP Recent Labs  Lab 08/30/23 2137 09/05/23 1818  BNP 859.6* 1,134.1*    DDimer No results for input(s): "DDIMER" in the last 168 hours.   Radiology    DG Chest Portable 1 View Result Date: 09/05/2023 CLINICAL DATA:  Chest pain EXAM: PORTABLE CHEST 1 VIEW COMPARISON:  Chest x-ray 08/30/2023 FINDINGS: The heart is enlarged. There is no focal lung infiltrate, pleural effusion or pneumothorax. No acute fractures are seen. Vertebroplasty changes are again seen in the thoracic region. IMPRESSION: Cardiomegaly. No active disease. Electronically Signed   By: Tyron Gallon M.D.   On: 09/05/2023 19:29    Cardiac Studies     Patient Profile     89 y.o. female with CAD with prior RCA PCI, moderate mid LAD stenosis by cath in 2021, LBBB, HFrEF, PVCs, AI, MR and HLD admitted with chest pain. Negative troponin. Recent admission for the same symptoms last week. Her chest pain was not felt to be cardiac related last week. Rapid atrial fib on the night of 09/05/23 requiring amiodarone infusion and IV heparin . Now in sinus on 09/06/23.   Assessment & Plan    CAD with angina: She is known to have CAD with 70% mid LAD stenosis by cath in 2021 and patent RCA stent. Now with second admission for chest pain with  negative troponin. It seems reasonable to proceed with cardiac cath but she tells me she is not mentally prepared for this today.  Will let her eat today Plan cardiac cath tomorrow Continue IV heparin  today in setting of atrial fib (troponin negative)  Continue ASA, Plavix , Ranexa , Toprol  and Crestor  NPO at midnight. Cath orders placed.   I have reviewed the risks, indications, and alternatives to cardiac catheterization, possible angioplasty, and stenting with the patient. Risks include but are not limited to bleeding, infection, vascular injury, stroke, myocardial infection, arrhythmia, kidney injury, radiation-related injury in the case of prolonged fluoroscopy use, emergency cardiac surgery, and death. The patient understands the risks of serious complication is 1-2 in 1000 with diagnostic cardiac cath and 1-2% or less with angioplasty/stenting.  2. Atrial fib with RVR: Converted to sinus on amiodarone overnight. Will continue IV amiodarone loading today. Continue IV heparin . She will need a DOAC post cath.   3. HFrEF: Known non-ischemic CM. Continue beta blocker.   4. HLD: continue statin  For questions or updates, please contact [facility] Please consult [email or web address] for contact info under      Signed, Antoinette Batman, MD  09/06/2023, 9:15 AM

## 2023-09-06 NOTE — Progress Notes (Addendum)
 Called to bedside by nursing. Pt arrived to 3E from the ED and had c/o sudden dyspnea. Chest x-ray with pulmonary edema. Exam c/w pulmonary edema. BP stable.  Will given STAT IV Lasix , supplemental O2 and transfer to 2H.  Pt is full code.   I updated her son by phone.   Antoinette Batman, MD, [facility] 09/06/2023 2:53 PM

## 2023-09-06 NOTE — Telephone Encounter (Signed)
 Patient Product/process development scientist completed.    The patient is insured through [facility]. Patient has Medicare and is not eligible for a copay card, but may be able to apply for patient assistance or Medicare RX Payment Plan (Patient Must reach out to their plan, if eligible for payment plan), if available.    Ran test claim for Eliquis 5 mg and the current 30 day co-pay is $45.00.  Ran test claim for Xarelto 20 mg and the current 30 day co-pay is $45.00.  Ran test claim for Entresto 24-26 mg and the current 30 day co-pay is $45.00.  Ran test claim for Farxiga 10 mg and the current 30 day co-pay is $45.00.  Ran test claim for Jardiance 10 mg and the current 30 day co-pay is $45.00.   This test claim was processed through [facility]- copay amounts may vary at other pharmacies due to pharmacy/plan contracts, or as the patient moves through the different stages of their insurance plan.     Morgan Arab, CPHT Pharmacy Technician III Certified Patient Advocate [facility] Patient Advocate Team Direct Number: [phone]  Fax: [phone]

## 2023-09-06 NOTE — Significant Event (Addendum)
 Rapid Response Event Note   Reason for Call :  Lethargy, increase in O2 needs  Initial Focused Assessment:  Pt recently arrived from ED, previously on 3L though arrived to [facility] on 10L. Patient is A&Ox2. Right upper rhonchi, left clear. Heart tones normal. Skin is diaphoretic, cool. Denies chest pain at this time. No edema noted. Pt with non-productive cough currently, though states she has been producing green/tan sputum.   95/80 (86) HR 94 RR 25-30 O2 93-97% 10L [place]  99.1 temp  Interventions/Plan of Care:  Placed on Salter: previously just on Cannula 10L CXR Oral suction  Event Summary:  MD Notified: per primary RN Call Time: 1345 Arrival Time: 1350 End Time: 1410  Ever Hiss, RN

## 2023-09-06 NOTE — H&P (Addendum)
 Cardiology Consultation:   Patient ID: Susan [name] MRN: [id number]; DOB: [birth year]  Admit date: 09/05/2023 Date of Consult: 09/05/2023  Primary Care Provider: Aldo Hun, MD [facility] Cardiologist: Ahmad Alert, MD  [facility] Electrophysiologist:  None   Patient Profile:   Susan [name] is a 89 y.o. female with CAD s/p RCA PCI (2005), residual pLAD stenosis, HFrEF, LBBB, frequent PVCs, CAD s/p bilateral ICA (2019), pituitary adenoma, mild-mod AI, mod MR and HLD who is being seen today for the evaluation of CP at the request of Dr. Jeannie Milo.  History of Present Illness:   Susan [name] presents with recurrent chest pain that started 05/04 AM.  She was evaluated by [facility] and given 750 cc NS en route for BP 100/50. She had already taken ASA 324 mg PO x1 prior to EMS arrival. EMS reported some lethargy on arrival.   She was just discharged on 09/01/23 for similar sx which were thought to be MSK in nature as they were reproducible on exam.   She was initially evaluated 08/30/23 for similar sx with temporary relief with SL NG.  ECG without any signs of ischemia and hsT mildly elevated but flat (24->25->25). BNP above prior baseline (859 from 200s). She was started on ranexa  and toprol  XL although it was felt the chest pain was likely non-anginal. She has been evaluated by EP (Dr. Rodolfo Clan, 11/20/21) with regards to her LBBB (at the time QRS <130) and reduced LVEF (30-35% in 2023) and was thought not to have significant benefit from consideration of CRT or ICD. Her PVCs were also infrequent on prior ECGs and thought to not be a significant contributor to her drop in LVEF. Recent OP monitor with only 3.1% isolated ventricular ectopy.   On arrival to the ED VS were: BP 99/58, P 91, RR 13, O2 98%/RA, T 98.1.  Evaluation with sCr 1.1 (mild AKI), hsT 20->19, proBNP 1134 (bl 200s, a week ago 859.  During my discussion with Ms. Arnesen she reports being remarkably independent up until around 8  weeks ago.  She was walking over 2 miles daily and still was taking care of all of her ADLs.  She noticed lightheadedness and presyncopal symptoms 2 months ago with associated chest pain that was relieved with nitroglycerin .  These sometimes did occur towards the end of her walks but were not reliably reproduced with exertional activity.  During this admission however her symptoms were different and predominantly midsternal 5/10 chest discomfort that started 05/04 at 1200 and lasted until she went to bed later that night it was still occurring when she woke up 05/05 AM.  This prompted her to seek evaluation in the emergency department.  05/05 AM she developed AF/RVR with significant symptoms of dyspnea and shortness of breath at rest requiring her to sit upright.  She had no associated chest discomfort with atrial fibrillation. She had RVR to the 160s. She was given metop tartrate 5 mg IV x1 and amio 150 mg IV x1 followed by amio gtt.   Past Medical History:  Diagnosis Date   Chronic HFrEF (heart failure with reduced ejection fraction) (HCC)    Coronary artery disease    PTCA/stenting of RCA 2005, moderate residual 70% stenosis of LAD stable by last cath 2021   Frequent PVCs    Hyperlipidemia    Hypothyroidism    LBBB (left bundle branch block)    Mild carotid artery disease (HCC)    Osteoporosis    Syncope    Past  Surgical History:  Procedure Laterality Date   APPENDECTOMY  1970s?   "ruptured"    BACK SURGERY     CATARACT EXTRACTION W/ INTRAOCULAR LENS  IMPLANT, BILATERAL Bilateral    CORONARY ANGIOPLASTY WITH STENT PLACEMENT  ~ 2009   "2 stents"   FRACTURE SURGERY     IR VERTEBROPLASTY CERV/THOR BX INC UNI/BIL INC/INJECT/IMAGING  02/07/2018   KNEE ARTHROSCOPY Right     torn meniscus   LEFT HEART CATH AND CORONARY ANGIOGRAPHY N/A 03/21/2020   Procedure: LEFT HEART CATH AND CORONARY ANGIOGRAPHY;  Surgeon: Avanell Leigh, MD;  Location: [facility] IN[facility]IVE [facility];  Service: Cardiovascular;   Laterality: N/A;   LUMBAR LAMINECTOMY  1978   OVARIAN CYST SURGERY  1970s?   "ruptured"    RIGHT/LEFT HEART CATH AND CORONARY ANGIOGRAPHY N/A 10/08/2017   Procedure: RIGHT/LEFT HEART CATH AND CORONARY ANGIOGRAPHY;  Surgeon: Sammy Crisp, MD;  Location: [facility] INVASIVE CV LAB;  Service: Cardiovascular;  Laterality: N/A;   WRIST FRACTURE SURGERY Bilateral    "2 surgeries for 2 breaks on left side; 1 surgery for 1 break on right wrist"    Home Medications:  Prior to Admission medications   Medication Sig Start Date End Date Taking? Authorizing Provider  acetaminophen  (TYLENOL ) 500 MG tablet Take 2 tablets (1,000 mg total) by mouth every 8 (eight) hours for 4 days, THEN 2 tablets (1,000 mg total) every 8 (eight) hours as needed for up to 15 days. 09/01/23 09/20/23 Yes Gonfa, Taye T, MD  Calcium  Carbonate-Vitamin D  (CALCIUM  + D PO) Take 1 tablet by mouth 2 (two) times daily.   Yes [provider]  cholecalciferol  (VITAMIN D3) 25 MCG (1000 UT) tablet Take 1,000 Units by mouth daily after breakfast.   Yes [provider]  clopidogrel  (PLAVIX ) 75 MG tablet Take 1 tablet by mouth once daily 03/03/23  Yes Nahser, Lela Purple, MD  denosumab  (PROLIA ) 60 MG/ML SOSY injection Inject 60 mg into the skin every 6 (six) months.   Yes [provider]  levothyroxine  (SYNTHROID , LEVOTHROID) 75 MCG tablet Take 75 mcg by mouth daily before breakfast.  11/30/17  Yes [provider]  metoprolol  succinate (TOPROL -XL) 25 MG 24 hr tablet Take 0.5 tablets (12.5 mg total) by mouth daily. 09/01/23  Yes Gonfa, Taye T, MD  multivitamin-lutein (OCUVITE-LUTEIN) CAPS capsule Take 1 capsule by mouth 2 (two) times a day.    Yes [provider]  nitroGLYCERIN  (NITROSTAT ) 0.4 MG SL tablet Place 1 tablet (0.4 mg total) under the tongue every 5 (five) minutes as needed. Patient taking differently: Place 0.4 mg under the tongue every 5 (five) minutes as needed for chest pain. 06/22/11  Yes Nahser,  Lela Purple, MD  ranolazine  (RANEXA ) 500 MG 12 hr tablet Take 1 tablet (500 mg total) by mouth 2 (two) times daily. 09/01/23  Yes Gonfa, Taye T, MD  rosuvastatin  (CRESTOR ) 40 MG tablet Take 1 tablet (40 mg total) by mouth daily. 01/18/23  Yes Nahser, Lela Purple, MD  venlafaxine  XR (EFFEXOR -XR) 150 MG 24 hr capsule Take 1 capsule (150 mg total) by mouth daily with breakfast. 02/19/23  Yes Nahser, Lela Purple, MD  vitamin B-12 (CYANOCOBALAMIN ) 1000 MCG tablet Take 2,000 mcg by mouth daily.    Yes [provider]   Inpatient Medications: Scheduled Meds:  lidocaine   1 patch Transdermal Q24H   Continuous Infusions:  PRN Meds:  Allergies:   No Known Allergies  Social History:   Social History   Socioeconomic History   Marital  status: Widowed    Spouse name: Not on file   Number of children: Not on file   Years of education: Not on file   Highest education level: Not on file  Occupational History   Not on file  Tobacco Use   Smoking status: Never   Smokeless tobacco: Never  Vaping Use   Vaping status: Never Used  Substance and Sexual Activity   Alcohol use: Yes    Alcohol/week: 1.0 standard drink of alcohol    Types: 1 Glasses of wine per week   Drug use: Never   Sexual activity: Not on file  Other Topics Concern   Not on file  Social History Narrative   Not on file   Social Drivers of Health   Financial Resource Strain: Not on file  Food Insecurity: No Food Insecurity (08/31/2023)   Hunger Vital Sign    Worried About Running Out of Food in the Last Year: Never true    Ran Out of Food in the Last Year: Never true  Transportation Needs: No Transportation Needs (08/31/2023)   PRAPARE - Administrator, Civil Service (Medical): No    Lack of Transportation (Non-Medical): No  Physical Activity: Not on file  Stress: Not on file  Social Connections: Moderately Integrated (08/31/2023)   Social Connection and Isolation Panel [NHANES]    Frequency of Communication with  Friends and Family: More than three times a week    Frequency of Social Gatherings with Friends and Family: More than three times a week    Attends Religious Services: More than 4 times per year    Active Member of [employer] or Organizations: Yes    Attends Banker Meetings: More than 4 times per year    Marital Status: Widowed  Intimate Partner Violence: Not At Risk (08/31/2023)   Humiliation, Afraid, Rape, and Kick questionnaire    Fear of Current or Ex-Partner: No    Emotionally Abused: No    Physically Abused: No    Sexually Abused: No    Family History:    Family History  Problem Relation Age of Onset   Alzheimer's disease Mother    Heart attack Father    Aneurysm Sister     ROS:  Review of Systems: [y] = yes, [ ]  = no      General: Weight gain [ ] ; Weight loss [ ] ; Anorexia [ ] ; Fatigue [x] ; Fever [ ] ; Chills [ ] ; Weakness [ ]    Cardiac: Chest pain/pressure [x] ; Resting SOB [ ] ; Exertional SOB [ ] ; Orthopnea [ ] ; Pedal Edema [ ] ; Palpitations [ ] ; Syncope [ ] ; Presyncope [ ] ; Paroxysmal nocturnal dyspnea [ ]    Pulmonary: Cough [ ] ; Wheezing [ ] ; Hemoptysis [ ] ; Sputum [ ] ; Snoring [ ]    GI: Vomiting [ ] ; Dysphagia [ ] ; Melena [ ] ; Hematochezia [ ] ; Heartburn [ ] ; Abdominal pain [ ] ; Constipation [ ] ; Diarrhea [ ] ; BRBPR [ ]    GU: Hematuria [ ] ; Dysuria [ ] ; Nocturia [ ]  Vascular: Pain in legs with walking [ ] ; Pain in feet with lying flat [ ] ; Non-healing sores [ ] ; Stroke [ ] ; TIA [ ] ; Slurred speech [ ] ;   Neuro: Headaches [ ] ; Vertigo [ ] ; Seizures [ ] ; Paresthesias [ ] ;Blurred vision [ ] ; Diplopia [ ] ; Vision changes [ ]    Ortho/Skin: Arthritis [ ] ; Joint pain [ ] ; Muscle pain [ ] ; Joint swelling [ ] ; Back Pain [ ] ; Rash [ ]   Psych: Depression [ ] ; Anxiety [ ]    Heme: Bleeding problems [ ] ; Clotting disorders [ ] ; Anemia [ ]    Endocrine: Diabetes [ ] ; Thyroid  dysfunction [ ]    Physical Exam/Data:   Vitals:   09/05/23 1930 09/05/23 2000 09/05/23 2015 09/05/23  2030  BP: (!) 85/45 93/64 (!) 90/57 (!) 91/51  Pulse:   84 64  Resp: 17 (!) 21 15 20   Temp:      SpO2: 97% 97% 99% 95%    Intake/Output Summary (Last 24 hours) at 09/05/2023 2207 Last data filed at 09/05/2023 2144 Gross per 24 hour  Intake 1100 ml  Output --  Net 1100 ml      09/01/2023    3:55 AM 08/31/2023    3:10 PM 08/30/2023    9:28 PM  Last 3 Weights  Weight (lbs) 127 lb 13.9 oz 129 lb 13.6 oz 132 lb 4.4 oz  Weight (kg) 58 kg 58.9 kg 60 kg     There is no height or weight on file to calculate BMI.   General:  Well nourished, well developed, in no acute distress HEENT: normal Neck: no JVD Vascular: No carotid bruits; FA pulses 2+ bilaterally without bruits  Cardiac:  normal S1, S2; RRR; no murmur  Lungs:  clear to auscultation bilaterally, no wheezing, rhonchi or rales  Ext: no edema Musculoskeletal:  No deformities, BUE and BLE strength normal and equal Skin: warm and dry  Psych:  Normal affect   EKG:  The EKG was personally reviewed and demonstrates:  09/06/23 (02:58:44): AF/RVR 155, QRS 132, QT/Qtc 283/455 09/05/23 (18:10:25): NSR 95, PR 153, QRS 137, QT/Qtc 397/500 Telemetry:  Telemetry was personally reviewed and demonstrates: NSR->AF/RVR  Relevant CV Studies:  Holter monitor Result date: 08/31/23   Predominant rhythm is sinus rhythm.   She had 2 episodes of nonsustained ventricular tachycardia.  The fastest interval lasted 8 beats with a maximal heart rate of 207.  The longest episode lasted 7 beats with an average heart rate of 173.  Neither of these were symptomatic.   She had 21 beats of supraventricular tachycardia (nonsustained).  The fastest and longest interval lasted 9 beats with a maximum heart rate of 185 and an average heart rate of 148.   Occasional premature ventricular contractions, rare premature atrial contractions  TTE Result date: 08/06/23  1. Left ventricular ejection fraction, by estimation, is 25 to 30%. The  left ventricle has severely  decreased function. The left ventricle  demonstrates global hypokinesis. There is mild left ventricular  hypertrophy of the basal-septal segment. Left  ventricular diastolic parameters are indeterminate.   2. Right ventricular systolic function is normal. The right ventricular  size is normal.   3. Left atrial size was mildly dilated.   4. The mitral valve is normal in structure. Moderate mitral valve  regurgitation. No evidence of mitral stenosis.   5. The aortic valve is tricuspid. There is moderate calcification of the  aortic valve. There is moderate thickening of the aortic valve. Aortic  valve regurgitation is moderate. Aortic valve sclerosis is present, with  no evidence of aortic valve  stenosis. Aortic regurgitation PHT measures 343 msec.   6. The inferior vena cava is normal in size with greater than 50%  respiratory variability, suggesting right atrial pressure of 3 mmHg.   SPECT Result date: 07/30/23   Findings are consistent with no ischemia. The study is high risk.   No ST deviation was noted.   LV perfusion is abnormal.  There is no evidence of ischemia. There is no evidence of infarction. Defect 1: There is a small defect with moderate reduction in uptake present in the apical anteroseptal location(s) that is fixed. There is abnormal wall motion in the defect area. Consistent with artifact caused by left bundle branch block.   Left ventricular function is abnormal. Global function is severely reduced. End diastolic cavity size is severely enlarged. End systolic cavity size is severely enlarged.   Prior study not available for comparison.  Coronary angiography Result date: 03/21/20 Previously placed Prox RCA to Mid RCA stent (unknown type) is widely patent. Previously placed Dist RCA stent (unknown type) is widely patent. Mid LAD lesion is 70% stenosed.  Laboratory Data:  High Sensitivity Troponin:   Recent Labs  Lab 08/30/23 2138 08/30/23 2317 08/31/23 0443  09/05/23 1818 09/05/23 2016  TROPONINIHS 24* 25* 25* 20* 19*     Chemistry Recent Labs  Lab 08/31/23 1005 09/01/23 0251 09/05/23 1818  NA 140 133* 136  K 2.9* 4.9 3.5  CL 112* 106 109  CO2 18* 20* 19*  GLUCOSE 77 122* 124*  BUN 10 14 14   CREATININE 0.77 1.09* 1.11*  CALCIUM  7.1* 8.8* 8.3*  GFRNONAA >60 49* 48*  ANIONGAP 10 7 8     Recent Labs  Lab 08/31/23 1005 09/01/23 0251 09/05/23 1818  PROT  --   --  6.0*  ALBUMIN 2.7* 3.4* 2.8*  AST  --   --  19  ALT  --   --  15  ALKPHOS  --   --  34*  BILITOT  --   --  0.5   Hematology Recent Labs  Lab 08/30/23 2138 09/01/23 0251 09/05/23 1818  WBC 9.5 10.4 9.9  RBC 3.90 3.99 3.37*  HGB 12.8 12.9 11.1*  HCT 38.3 38.7 33.7*  MCV 98.2 97.0 100.0  MCH 32.8 32.3 32.9  MCHC 33.4 33.3 32.9  RDW 14.6 14.5 14.7  PLT 205 195 220   BNP Recent Labs  Lab 08/30/23 2137 09/05/23 1818  BNP 859.6* 1,134.1*    DDimer No results for input(s): "DDIMER" in the last 168 hours.  Radiology/Studies:  DG Chest Portable 1 View Result Date: 09/05/2023 CLINICAL DATA:  Chest pain EXAM: PORTABLE CHEST 1 VIEW COMPARISON:  Chest x-ray 08/30/2023 FINDINGS: The heart is enlarged. There is no focal lung infiltrate, pleural effusion or pneumothorax. No acute fractures are seen. Vertebroplasty changes are again seen in the thoracic region. IMPRESSION: Cardiomegaly. No active disease. Electronically Signed   By: Tyron Gallon M.D.   On: 09/05/2023 19:29   TIMI Risk Score for Unstable Angina or Non-ST Elevation MI:   The patient's TIMI risk score is 5, which indicates a 26% risk of all cause mortality, new or recurrent myocardial infarction or need for urgent revascularization in the next 14 days.{  Assessment and Plan:  JAZZLYNE MECKLENBURG is a 89 y.o. female with CAD s/p RCA PCI (2005), residual pLAD stenosis, HFrEF, LBBB, frequent PVCs, CAD s/p bilateral ICA (2019), pituitary adenoma, mild-mod AI, mod MR and HLD who is being seen for chest pain and  new onset AF/RVR  Chest pain  CAD s/p RCA PCI pLAD obstruction without intervention  She has had extensive evaluation for ischemia and unfortunately has not tolerated Imdur  2/2 orthostasis and ranexa  hasn't improved sx. She continues on low dose toprol  XL and plavix . She has known pLAD stenosis which I presume has likely worsened with time (70% stenosed in 03/2020). We discussed different options for  management of her CAD including both conservative and aggressive approaches. She preferences proceeding with repeat coronary angiography if there is a reasonable chance that it could provide some benefit. She understands that this may not be the only thing contributing to her sx. We will consider cath but defer for now with symptomatic new onset AF/RVR until rates are better controlled. Have not started yet on OAC (32F, HFrEF, vascular dz, C2V 5) but plan to after possible cath.  - resume OP plavix  75 mg daily - s/p ASA 324 mg PO x1 - continue ASA 81 mg daily for now, will d/c once starting apixaban   AF/RVR New onset this hospitalization. She was very sx with AF/RVR however sx mainly SOB without any chest discomfort. She had never felt this before. I don't think this correlates well with her recent chest sx. S/p metop tartrate 5 mg IV followed by amio 150 mg IV with bolus. Will start OAC once we sort out plan for her known obstructive CAD and current presentation that seems c/w possible angina.  - start apixaban 2.5 mg bid (32F, sCr 1.1, 58 kg)   HFrEF Her LVEF has been in decline since 09/2017. Likely multifactorial however CAD is likely contributor. PVCs are low burden (3.1%) so doubt they are a large contributor. They are predominately unifocal with LBBB morphology and inferior axis (possible RVOT).   PVCs - starting amio for AF/RVR so this should control  Bilateral ICA - OP f/u to reschedule   Pituitary adenoma  - nothing to do   HLD - start atorva 80 mg at bedtime   Signed, Katrine Parody, MD  09/06/2023 04:31 AM

## 2023-09-07 ENCOUNTER — Other Ambulatory Visit (HOSPITAL_COMMUNITY): Payer: Self-pay

## 2023-09-07 ENCOUNTER — Encounter (HOSPITAL_COMMUNITY)
Admission: EM | Disposition: A | Discharge status: Home / Self Care | Payer: Self-pay | Source: Home / Self Care | Attending: Student in an Organized Health Care Education/Training Program

## 2023-09-07 ENCOUNTER — Telehealth (HOSPITAL_COMMUNITY): Payer: Self-pay | Admitting: Pharmacy Technician

## 2023-09-07 DIAGNOSIS — I4891 Unspecified atrial fibrillation: Secondary | ICD-10-CM

## 2023-09-07 DIAGNOSIS — I5023 Acute on chronic systolic (congestive) heart failure: Secondary | ICD-10-CM

## 2023-09-07 DIAGNOSIS — I25110 Atherosclerotic heart disease of native coronary artery with unstable angina pectoris: Principal | ICD-10-CM

## 2023-09-07 HISTORY — PX: LEFT HEART CATH AND CORONARY ANGIOGRAPHY: CATH118249

## 2023-09-07 LAB — CBC
HCT: 32.2 % — ABNORMAL LOW (ref 36.0–46.0)
Hemoglobin: 10.6 g/dL — ABNORMAL LOW (ref 12.0–15.0)
MCH: 32.2 pg (ref 26.0–34.0)
MCHC: 32.9 g/dL (ref 30.0–36.0)
MCV: 97.9 fL (ref 80.0–100.0)
Platelets: 199 10*3/uL (ref 150–400)
RBC: 3.29 MIL/uL — ABNORMAL LOW (ref 3.87–5.11)
RDW: 14.8 % (ref 11.5–15.5)
WBC: 8.1 10*3/uL (ref 4.0–10.5)
nRBC: 0 % (ref 0.0–0.2)

## 2023-09-07 LAB — BASIC METABOLIC PANEL WITH GFR
Anion gap: 10 (ref 5–15)
Anion gap: 12 (ref 5–15)
BUN: 14 mg/dL (ref 8–23)
BUN: 16 mg/dL (ref 8–23)
CO2: 21 mmol/L — ABNORMAL LOW (ref 22–32)
CO2: 20 mmol/L — ABNORMAL LOW (ref 22–32)
Calcium: 7.9 mg/dL — ABNORMAL LOW (ref 8.9–10.3)
Calcium: 8 mg/dL — ABNORMAL LOW (ref 8.9–10.3)
Chloride: 107 mmol/L (ref 98–111)
Chloride: 104 mmol/L (ref 98–111)
Creatinine, Ser: 1.03 mg/dL — ABNORMAL HIGH (ref 0.44–1.00)
Creatinine, Ser: 1.01 mg/dL — ABNORMAL HIGH (ref 0.44–1.00)
GFR, Estimated: 53 mL/min — ABNORMAL LOW (ref >60)
GFR, Estimated: 54 mL/min — ABNORMAL LOW (ref >60)
Glucose, Bld: 94 mg/dL (ref 70–99)
Glucose, Bld: 108 mg/dL — ABNORMAL HIGH (ref 70–99)
Potassium: 3.6 mmol/L (ref 3.5–5.1)
Potassium: 3.5 mmol/L (ref 3.5–5.1)
Sodium: 138 mmol/L (ref 135–145)
Sodium: 136 mmol/L (ref 135–145)

## 2023-09-07 LAB — HEPARIN LEVEL (UNFRACTIONATED): Heparin Unfractionated: 0.16 [IU]/mL — ABNORMAL LOW (ref 0.30–0.70)

## 2023-09-07 LAB — MAGNESIUM: Magnesium: 1.9 mg/dL (ref 1.7–2.4)

## 2023-09-07 LAB — HEMOGLOBIN A1C
Hgb A1c MFr Bld: 5.5 % (ref 4.8–5.6)
Mean Plasma Glucose: 111.15 mg/dL

## 2023-09-07 SURGERY — LEFT HEART CATH AND CORONARY ANGIOGRAPHY
Anesthesia: LOCAL

## 2023-09-07 MED ORDER — LIDOCAINE HCL (PF) 1 % IJ SOLN
INTRAMUSCULAR | Status: AC
  Filled 2023-09-07: qty 30

## 2023-09-07 MED ORDER — SODIUM CHLORIDE 0.9 % WEIGHT BASED INFUSION: 3.0000 mL/kg/h | INTRAVENOUS | Status: AC

## 2023-09-07 MED ORDER — MIDAZOLAM HCL 2 MG/2ML IJ SOLN
INTRAMUSCULAR | Status: DC | PRN
  Administered 2023-09-07: 1 mg via INTRAVENOUS

## 2023-09-07 MED ORDER — LIDOCAINE HCL (PF) 1 % IJ SOLN
INTRAMUSCULAR | Status: DC | PRN
  Administered 2023-09-07: 2 mL
  Administered 2023-09-07: 10 mL

## 2023-09-07 MED ORDER — HYDRALAZINE HCL 20 MG/ML IJ SOLN: 10.0000 mg | INTRAMUSCULAR | Status: AC | PRN

## 2023-09-07 MED ORDER — VERAPAMIL HCL 2.5 MG/ML IV SOLN
INTRAVENOUS | Status: DC | PRN
Start: 2023-09-07 — End: 2023-09-07
  Administered 2023-09-07: 10 mL via INTRA_ARTERIAL

## 2023-09-07 MED ORDER — ACETAMINOPHEN 325 MG PO TABS: 650.0000 mg | ORAL_TABLET | ORAL | Status: DC | PRN

## 2023-09-07 MED ORDER — FENTANYL CITRATE (PF) 100 MCG/2ML IJ SOLN
INTRAMUSCULAR | Status: DC | PRN
  Administered 2023-09-07: 25 ug via INTRAVENOUS

## 2023-09-07 MED ORDER — SODIUM CHLORIDE 0.9 % WEIGHT BASED INFUSION
1.0000 mL/kg/h | INTRAVENOUS | Status: DC
  Administered 2023-09-07: 1 mL/kg/h via INTRAVENOUS

## 2023-09-07 MED ORDER — ASPIRIN 81 MG PO CHEW
81.0000 mg | CHEWABLE_TABLET | ORAL | Status: AC
  Administered 2023-09-07: 81 mg via ORAL

## 2023-09-07 MED ORDER — ENOXAPARIN SODIUM 40 MG/0.4ML IJ SOSY: 40.0000 mg | PREFILLED_SYRINGE | INTRAMUSCULAR | Status: DC

## 2023-09-07 MED ORDER — ASPIRIN 81 MG PO CHEW: 81.0000 mg | CHEWABLE_TABLET | ORAL | Status: DC

## 2023-09-07 MED ORDER — IOHEXOL 350 MG/ML SOLN
INTRAVENOUS | Status: DC | PRN
  Administered 2023-09-07: 50 mL

## 2023-09-07 MED ORDER — ORAL CARE MOUTH RINSE: 15.0000 mL | OROMUCOSAL | Status: DC | PRN

## 2023-09-07 MED ORDER — APIXABAN 2.5 MG PO TABS
2.5000 mg | ORAL_TABLET | Freq: Two times a day (BID) | ORAL | Status: DC
  Administered 2023-09-07 – 2023-09-09 (×4): 2.5 mg via ORAL
  Filled 2023-09-07 (×4): qty 1

## 2023-09-07 MED ORDER — FENTANYL CITRATE (PF) 100 MCG/2ML IJ SOLN
INTRAMUSCULAR | Status: AC
Start: 2023-09-07 — End: ?
  Filled 2023-09-07: qty 2

## 2023-09-07 MED ORDER — SODIUM CHLORIDE 0.9% FLUSH: 3.0000 mL | INTRAVENOUS | Status: DC | PRN

## 2023-09-07 MED ORDER — ONDANSETRON HCL 4 MG/2ML IJ SOLN
4.0000 mg | Freq: Four times a day (QID) | INTRAMUSCULAR | Status: DC | PRN
  Administered 2023-09-08: 4 mg via INTRAVENOUS
  Filled 2023-09-07: qty 2

## 2023-09-07 MED ORDER — SODIUM CHLORIDE 0.9 % IV SOLN: 250.0000 mL | INTRAVENOUS | Status: AC | PRN

## 2023-09-07 MED ORDER — SODIUM CHLORIDE 0.9 % WEIGHT BASED INFUSION: 1.0000 mL/kg/h | INTRAVENOUS | Status: DC

## 2023-09-07 MED ORDER — HEPARIN (PORCINE) IN NACL 2000-0.9 UNIT/L-% IV SOLN
INTRAVENOUS | Status: DC | PRN
  Administered 2023-09-07: 1000 mL

## 2023-09-07 MED ORDER — SODIUM CHLORIDE 0.9 % WEIGHT BASED INFUSION: 3.0000 mL/kg/h | INTRAVENOUS | Status: DC

## 2023-09-07 MED ORDER — MIDAZOLAM HCL 2 MG/2ML IJ SOLN
INTRAMUSCULAR | Status: AC
  Filled 2023-09-07: qty 2

## 2023-09-07 MED ORDER — SODIUM CHLORIDE 0.9% FLUSH
3.0000 mL | Freq: Two times a day (BID) | INTRAVENOUS | Status: DC
  Administered 2023-09-07 – 2023-09-09 (×4): 3 mL via INTRAVENOUS

## 2023-09-07 MED ORDER — VERAPAMIL HCL 2.5 MG/ML IV SOLN
INTRAVENOUS | Status: AC
  Filled 2023-09-07: qty 2

## 2023-09-07 MED ORDER — LABETALOL HCL 5 MG/ML IV SOLN: 10.0000 mg | INTRAVENOUS | Status: AC | PRN

## 2023-09-07 SURGICAL SUPPLY — 12 items
CATH INFINITI 5FR MULTPACK ANG (CATHETERS) IMPLANT
CLOSURE MYNX CONTROL 5F (Vascular Products) IMPLANT
DEVICE RAD TR BAND REGULAR (VASCULAR PRODUCTS) IMPLANT
GLIDESHEATH SLEND SS 6F .021 (SHEATH) IMPLANT
GUIDEWIRE INQWIRE 1.5J.035X260 (WIRE) IMPLANT
KIT MICROPUNCTURE NIT STIFF (SHEATH) IMPLANT
PACK CARDIAC CATHETERIZATION (CUSTOM PROCEDURE TRAY) ×1 IMPLANT
SET ATX-X65L (MISCELLANEOUS) IMPLANT
SHEATH PINNACLE 5F 10CM (SHEATH) IMPLANT
SHEATH PROBE COVER 6X72 (BAG) IMPLANT
WIRE EMERALD 3MM-J .035X150CM (WIRE) IMPLANT
WIRE HI TORQ VERSACORE-J 145CM (WIRE) IMPLANT

## 2023-09-07 NOTE — Progress Notes (Signed)
 OT Cancellation Note  Patient Details Name: Susan [name] MRN: [id number] DOB: April 14, 1934   Cancelled Treatment:    Reason Eval/Treat Not Completed: Patient at procedure or test/ unavailable  Benjamen Brand 09/07/2023, 10:39 AM 09/07/2023  RP, OTR/L  Acute Rehabilitation Services  Office:  [phone]

## 2023-09-07 NOTE — Progress Notes (Signed)
 PT Cancellation Note  Patient Details Name: Susan [name] MRN: [id number] DOB: 05-25-1933   Cancelled Treatment:    Reason Eval/Treat Not Completed: Other (comment). Pt with episode of passing out yesterday while mobilizing. Pt planned for heart cath this morning. PT to hold until post heart cath. PT to return as able, as appropriate to complete PT eval.  Renaee Caro, PT, DPT Acute Rehabilitation Services Secure chat preferred Office #: [phone]    Jenna Moan 09/07/2023, 7:47 AM

## 2023-09-07 NOTE — Plan of Care (Signed)
   Problem: Activity: Goal: Risk for activity intolerance will decrease Outcome: Progressing   Problem: Coping: Goal: Level of anxiety will decrease Outcome: Progressing   Problem: Elimination: Goal: Will not experience complications related to bowel motility Outcome: Progressing   Problem: Safety: Goal: Ability to remain free from injury will improve Outcome: Progressing

## 2023-09-07 NOTE — Telephone Encounter (Signed)
 Patient Product/process development scientist completed.    The patient is insured through [facility]. Patient has Medicare and is not eligible for a copay card, but may be able to apply for patient assistance or Medicare RX Payment Plan (Patient Must reach out to their plan, if eligible for payment plan), if available.    Ran test claim for Eliquis 5 mg and the current 30 day co-pay is $45.00.  Ran test claim for Xarelto 20 mg and the current 30 day co-pay is $45.00.  This test claim was processed through [facility]- copay amounts may vary at other pharmacies due to pharmacy/plan contracts, or as the patient moves through the different stages of their insurance plan.     Roland Earl, CPHT Pharmacy Technician III Certified Patient Advocate [facility] Patient Advocate Team Direct Number: [phone]  Fax: [phone]

## 2023-09-07 NOTE — Progress Notes (Signed)
 PHARMACY - ANTICOAGULATION CONSULT NOTE  Pharmacy Consult for heparin  > apixaban  Indication: atrial fibrillation  No Known Allergies  Patient Measurements: Height: 5\' 3"  (160 cm) Weight: 62 kg (136 lb 11 oz) IBW/kg (Calculated) : 52.4 HEPARIN  DW (KG): 62  Vital Signs: Temp: 97.9 F (36.6 C) (05/06 1113) Temp Source: Oral (05/06 1113) BP: 113/64 (05/06 1056) Pulse Rate: 78 (05/06 1056)  Labs: Recent Labs    09/05/23 1818 09/05/23 2016 09/06/23 1412 09/07/23 0331  HGB 11.1*  --   --  10.6*  HCT 33.7*  --   --  32.2*  PLT 220  --   --  199  HEPARINUNFRC  --   --  0.37 0.16*  CREATININE 1.11*  --   --  1.03*  TROPONINIHS 20* 19*  --   --     Estimated Creatinine Clearance: 31.8 mL/min (A) (by C-G formula based on SCr of 1.03 mg/dL (H)).   Medical History: Past Medical History:  Diagnosis Date   Chronic HFrEF (heart failure with reduced ejection fraction) (HCC)    Coronary artery disease    PTCA/stenting of RCA 2005, moderate residual 70% stenosis of LAD stable by last cath 2021   Frequent PVCs    Hyperlipidemia    Hypothyroidism    LBBB (left bundle branch block)    Mild carotid artery disease (HCC)    Osteoporosis    Syncope     Medications:  No current facility-administered medications on file prior to encounter.   Current Outpatient Medications on File Prior to Encounter  Medication Sig Dispense Refill   acetaminophen  (TYLENOL ) 500 MG tablet Take 2 tablets (1,000 mg total) by mouth every 8 (eight) hours for 4 days, THEN 2 tablets (1,000 mg total) every 8 (eight) hours as needed for up to 15 days.     Calcium  Carbonate-Vitamin D  (CALCIUM  + D PO) Take 1 tablet by mouth 2 (two) times daily.     cholecalciferol  (VITAMIN D3) 25 MCG (1000 UT) tablet Take 1,000 Units by mouth daily after breakfast.     clopidogrel  (PLAVIX ) 75 MG tablet Take 1 tablet by mouth once daily 90 tablet 2   denosumab  (PROLIA ) 60 MG/ML SOSY injection Inject 60 mg into the skin every 6  (six) months.     levothyroxine  (SYNTHROID , LEVOTHROID) 75 MCG tablet Take 75 mcg by mouth daily before breakfast.   0   metoprolol  succinate (TOPROL -XL) 25 MG 24 hr tablet Take 0.5 tablets (12.5 mg total) by mouth daily. 45 tablet 0   multivitamin-lutein (OCUVITE-LUTEIN) CAPS capsule Take 1 capsule by mouth 2 (two) times a day.      nitroGLYCERIN  (NITROSTAT ) 0.4 MG SL tablet Place 1 tablet (0.4 mg total) under the tongue every 5 (five) minutes as needed. (Patient taking differently: Place 0.4 mg under the tongue every 5 (five) minutes as needed for chest pain.) 25 tablet 3   ranolazine  (RANEXA ) 500 MG 12 hr tablet Take 1 tablet (500 mg total) by mouth 2 (two) times daily. 180 tablet 0   rosuvastatin  (CRESTOR ) 40 MG tablet Take 1 tablet (40 mg total) by mouth daily. 90 tablet 3   venlafaxine  XR (EFFEXOR -XR) 150 MG 24 hr capsule Take 1 capsule (150 mg total) by mouth daily with breakfast. 90 capsule 2   vitamin B-12 (CYANOCOBALAMIN ) 1000 MCG tablet Take 2,000 mcg by mouth daily.        Assessment: 89 y.o. female with new onset Afib for heparin  Goal of Therapy:  Heparin  level 0.3-0.7  units/ml Monitor platelets by anticoagulation protocol: Yes  Initial heparin  level 0.37, therapeutic on heparin  drip rate 750 units/hr.  No issues noted.  Thi am heparin  level low 0.16 but then off to cath lab.   No intervention in cath lab  - plan for apixaban tonight post cath for new Afib> SR this admit  Age > 82 cr 1 current wt 62 but volume overloaded on IV furosemide  > dry wt 2 days ago 58kg Will dose reduce apixaban 2.5mg  BID    Plan:  Apixaban 2.5mg  bid Monitor s/s bleeding   (DOACs = $47/mo)   Susan [name] Pharm.D. CPP, BCPS Clinical Pharmacist [phone] 09/07/2023 2:03 PM

## 2023-09-07 NOTE — Progress Notes (Signed)
 Rounding Note    Patient Name: Susan [name] Date of Encounter: 09/07/2023   [facility] Cardiologist: Ahmad Alert, MD   Subjective   Breathing is much better. No chest pain this am.   Inpatient Medications    Scheduled Meds:  aspirin   81 mg Oral Daily   Chlorhexidine Gluconate Cloth  6 each Topical Daily   clopidogrel   75 mg Oral Daily   furosemide   40 mg Intravenous BID   levothyroxine   75 mcg Oral QAC breakfast   lidocaine   1 patch Transdermal Q24H   metoprolol  succinate  12.5 mg Oral Daily   ranolazine   500 mg Oral BID   rosuvastatin   40 mg Oral Daily   venlafaxine  XR  150 mg Oral Q breakfast   Continuous Infusions:  sodium chloride  1 mL/kg/hr (09/07/23 0535)   amiodarone 30 mg/hr (09/06/23 2212)   heparin  750 Units/hr (09/06/23 1500)   PRN Meds: mouth rinse   Vital Signs    Vitals:   09/07/23 0500 09/07/23 0600 09/07/23 0700 09/07/23 0803  BP: 117/89 116/75 114/69   Pulse: 79 77 81   Resp: (!) 31 (!) 36 (!) 22   Temp:    98.3 F (36.8 C)  TempSrc:    Oral  SpO2: 96% 98% 98%   Weight:      Height:        Intake/Output Summary (Last 24 hours) at 09/07/2023 0850 Last data filed at 09/06/2023 1710 Gross per 24 hour  Intake 465.61 ml  Output 450 ml  Net 15.61 ml      09/06/2023    4:13 PM 09/01/2023    3:55 AM 08/31/2023    3:10 PM  Last 3 Weights  Weight (lbs) 136 lb 11 oz 127 lb 13.9 oz 129 lb 13.6 oz  Weight (kg) 62 kg 58 kg 58.9 kg      Telemetry     Sinus- Personally Reviewed  ECG    No AM EKG- Personally Reviewed  Physical Exam   General: Well developed, well nourished, NAD  HEENT: OP clear, mucus membranes moist  SKIN: warm, dry. No rashes. Neuro: No focal deficits  Musculoskeletal: Muscle strength 5/5 all ext  Psychiatric: Mood and affect normal  Neck: No JVD, no carotid bruits, no thyromegaly, no lymphadenopathy.  Lungs:Clear bilaterally, no wheezes, rhonci, crackles Cardiovascular: Regular rate and rhythm. No  murmurs, gallops or rubs. Abdomen:Soft. Bowel sounds present. Non-tender.  Extremities: No lower extremity edema.    Labs    High Sensitivity Troponin:   Recent Labs  Lab 08/30/23 2138 08/30/23 2317 08/31/23 0443 09/05/23 1818 09/05/23 2016  TROPONINIHS 24* 25* 25* 20* 19*     Chemistry Recent Labs  Lab 08/31/23 1005 09/01/23 0251 09/05/23 1818 09/07/23 0331  NA 140 133* 136 138  K 2.9* 4.9 3.5 3.6  CL 112* 106 109 107  CO2 18* 20* 19* 21*  GLUCOSE 77 122* 124* 94  BUN 10 14 14 14   CREATININE 0.77 1.09* 1.11* 1.03*  CALCIUM  7.1* 8.8* 8.3* 7.9*  MG 1.8 2.3  --   --   PROT  --   --  6.0*  --   ALBUMIN 2.7* 3.4* 2.8*  --   AST  --   --  19  --   ALT  --   --  15  --   ALKPHOS  --   --  34*  --   BILITOT  --   --  0.5  --   GFRNONAA >  60 49* 48* 53*  ANIONGAP 10 7 8 10     Lipids  No results for input(s): "CHOL", "TRIG", "HDL", "LABVLDL", "LDLCALC", "CHOLHDL" in the last 168 hours.   Hematology Recent Labs  Lab 09/01/23 0251 09/05/23 1818 09/07/23 0331  WBC 10.4 9.9 8.1  RBC 3.99 3.37* 3.29*  HGB 12.9 11.1* 10.6*  HCT 38.7 33.7* 32.2*  MCV 97.0 100.0 97.9  MCH 32.3 32.9 32.2  MCHC 33.3 32.9 32.9  RDW 14.5 14.7 14.8  PLT 195 220 199   Thyroid  No results for input(s): "TSH", "FREET4" in the last 168 hours.  BNP Recent Labs  Lab 09/05/23 1818  BNP 1,134.1*    DDimer No results for input(s): "DDIMER" in the last 168 hours.   Radiology    DG Chest Port 1 View Result Date: 09/06/2023 CLINICAL DATA:  Acute respiratory distress EXAM: PORTABLE CHEST 1 VIEW COMPARISON:  Sep 04, 2024 FINDINGS: Worsening pulmonary infiltrates correlate with bilateral pulmonary edema and congestive changes with a small bilateral pleural effusions. Heart is slightly enlarged correlate with CHF Status post prior vertebroplasty upper thoracic spine vertebral body IMPRESSION: Worsening pulmonary infiltrates correlate with bilateral pulmonary edema and congestive changes with a small  bilateral pleural effusions. Electronically Signed   By: Fredrich Jefferson M.D.   On: 09/06/2023 14:29   DG Chest Portable 1 View Result Date: 09/05/2023 CLINICAL DATA:  Chest pain EXAM: PORTABLE CHEST 1 VIEW COMPARISON:  Chest x-ray 08/30/2023 FINDINGS: The heart is enlarged. There is no focal lung infiltrate, pleural effusion or pneumothorax. No acute fractures are seen. Vertebroplasty changes are again seen in the thoracic region. IMPRESSION: Cardiomegaly. No active disease. Electronically Signed   By: Tyron Gallon M.D.   On: 09/05/2023 19:29    Cardiac Studies     Patient Profile     89 y.o. female with CAD with prior RCA PCI, moderate mid LAD stenosis by cath in 2021, LBBB, HFrEF, PVCs, AI, MR and HLD admitted with chest pain. Negative troponin. Recent admission for the same symptoms last week. Her chest pain was not felt to be cardiac related last week. Rapid atrial fib on the night of 09/05/23 requiring amiodarone infusion and IV heparin . Now in sinus on 09/06/23.   Assessment & Plan    CAD with angina: She is known to have CAD with 70% mid LAD stenosis by cath in 2021 and patent RCA stent. Now with second admission for chest pain with negative troponin.  Will plan cardiac cath today (Right and left heart cath)  Continue ASA, Plavix , Ranexa , Toprol  and Crestor    I have reviewed the risks, indications, and alternatives to cardiac catheterization, possible angioplasty, and stenting with the patient. Risks include but are not limited to bleeding, infection, vascular injury, stroke, myocardial infection, arrhythmia, kidney injury, radiation-related injury in the case of prolonged fluoroscopy use, emergency cardiac surgery, and death. The patient understands the risks of serious complication is 1-2 in 1000 with diagnostic cardiac cath and 1-2% or less with angioplasty/stenting.  2. Atrial fib with RVR: Converted to sinus on amiodarone Will stop IV amiodarone and continue Toprol . Continue IV heparin   until cath and then will start a DOAC post cath.    3. HFrEF/Acute on chronic systolic CHF: Flash pulmonary edema yesterday. She has diuresed well. Will hold IV Lasix  today. Known non-ischemic CM. Continue beta blocker.   4. HLD: continue statin  For questions or updates, please contact [facility] Please consult [email or web address] for contact info under  Signed, Antoinette Batman, MD  09/07/2023, 8:50 AM

## 2023-09-07 NOTE — Evaluation (Signed)
 Occupational Therapy Evaluation Patient Details Name: Susan [name] MRN: [id number] DOB: [birth year] Today's Date: 09/07/2023   History of Present Illness   89 y.o. female with CAD s/p RCA PCI (2005), residual pLAD stenosis, HFrEF, LBBB, frequent PVCs, CAD s/p bilateral ICA (2019), pituitary adenoma, mild-mod AI, mod MR and HLD who was adm 5/5 for the evaluation of CP.  S/p R and L Heart Cath 5/6.     Clinical Impressions Patient admitted for the diagnosis above.  PTA she lives at home, and remains very active and independent with all ADL, iADL, continues to drive and recently aged out of her tennis league.  Patient remains a little out of breath, but otherwise is generalized supervision for OOB mobility, Min A for lower body ADL due to femoral site for heart cath.  Patient should progress quickly, and no post acute OT is anticipated given family support.  PT Consult pending.  OOB cleared with RN after bedrest order expired.       If plan is discharge home, recommend the following:   Assist for transportation;Assistance with cooking/housework     Functional Status Assessment   Patient has had a recent decline in their functional status and demonstrates the ability to make significant improvements in function in a reasonable and predictable amount of time.     Equipment Recommendations   Tub/shower bench     Recommendations for Other Services         Precautions/Restrictions   Precautions Precautions: Fall Restrictions Weight Bearing Restrictions Per Provider Order: Yes Other Position/Activity Restrictions: A-line removed and advised by Cardiac PA to limit pushing and pulling for 24 hours.  Weight restrictions for up to two weeks.     Mobility Bed Mobility Overal bed mobility: Needs Assistance Bed Mobility: Sidelying to Sit   Sidelying to sit: Min assist         Patient Response: Cooperative  Transfers Overall transfer level: Needs assistance Equipment  used: None Transfers: Sit to/from Stand, Bed to chair/wheelchair/BSC Sit to Stand: Supervision     Step pivot transfers: Contact guard assist            Balance Overall balance assessment: Needs assistance Sitting-balance support: Feet supported Sitting balance-Leahy Scale: Good     Standing balance support: Single extremity supported Standing balance-Leahy Scale: Fair                             ADL either performed or assessed with clinical judgement   ADL       Grooming: Wash/dry hands;Wash/dry face;Set up;Sitting               Lower Body Dressing: Minimal assistance;Sit to/from stand   Toilet Transfer: Contact guard assist;Stand-pivot;BSC/3in1                   Vision Patient Visual Report: No change from baseline       Perception Perception: Within Functional Limits       Praxis Praxis: WFL       Pertinent Vitals/Pain Pain Assessment Pain Assessment: Faces Faces Pain Scale: Hurts a little bit Pain Location: Groin site Pain Descriptors / Indicators: Sore Pain Intervention(s): Monitored during session     Extremity/Trunk Assessment Upper Extremity Assessment Upper Extremity Assessment: Overall WFL for tasks assessed   Lower Extremity Assessment Lower Extremity Assessment: Defer to PT evaluation   Cervical / Trunk Assessment Cervical / Trunk Assessment: Normal   Communication Communication Communication: No  apparent difficulties   Cognition Arousal: Alert Behavior During Therapy: WFL for tasks assessed/performed Cognition: No apparent impairments                               Following commands: Intact       Cueing  General Comments   Cueing Techniques: Verbal cues   VSS on 2L O2   Exercises     Shoulder Instructions      Home Living Family/patient expects to be discharged to:: Private residence Living Arrangements: Children Available Help at Discharge: Family;Available  PRN/intermittently Type of Home: House Home Access: Stairs to enter [employer] of Steps: 4 Entrance Stairs-Rails: Right;Left;Can reach both Home Layout: One level     Bathroom Shower/Tub: Chief Strategy Officer: Standard Bathroom Accessibility: Yes How Accessible: Accessible via walker Home Equipment: None          Prior Functioning/Environment Prior Level of Function : Independent/Modified Independent;Driving                    OT Problem List: Decreased activity tolerance;Impaired balance (sitting and/or standing)   OT Treatment/Interventions: Self-care/ADL training;Therapeutic activities;Balance training;DME and/or AE instruction;Patient/family education;Energy conservation      OT Goals(Current goals can be found in the care plan section)   Acute Rehab OT Goals Patient Stated Goal: Return home OT Goal Formulation: With patient Time For Goal Achievement: 09/21/23 Potential to Achieve Goals: Good ADL Goals Pt Will Perform Grooming: Independently;standing Pt Will Perform Lower Body Dressing: Independently;sit to/from stand Pt Will Transfer to Toilet: Independently;ambulating;regular height toilet   OT Frequency:  Min 2X/week    Co-evaluation              AM-PAC OT "6 Clicks" Daily Activity     Outcome Measure Help from another person eating meals?: None Help from another person taking care of personal grooming?: A Little Help from another person toileting, which includes using toliet, bedpan, or urinal?: A Little Help from another person bathing (including washing, rinsing, drying)?: A Little Help from another person to put on and taking off regular upper body clothing?: A Little Help from another person to put on and taking off regular lower body clothing?: A Little 6 Click Score: 19   End of Session Equipment Utilized During Treatment: Oxygen Nurse Communication: Mobility status  Activity Tolerance: Patient tolerated  treatment well Patient left: in chair;with call bell/phone within reach;with family/visitor present  OT Visit Diagnosis: Unsteadiness on feet (R26.81)                Time: 0865-7846 OT Time Calculation (min): 24 min Charges:  OT General Charges $OT Visit: 1 Visit OT Evaluation $OT Eval Moderate Complexity: 1 Mod OT Treatments $Self Care/Home Management : 8-22 mins  09/07/2023  RP, OTR/L  Acute Rehabilitation Services  Office:  [phone]   Benjamen Brand 09/07/2023, 3:06 PM

## 2023-09-07 NOTE — Interval H&P Note (Signed)
 History and Physical Interval Note:  09/07/2023 10:09 AM  Susan [name]  has presented today for surgery, with the diagnosis of unstable angina.  The various methods of treatment have been discussed with the patient and family. After consideration of risks, benefits and other options for treatment, the patient has consented to  Procedure(s): LEFT HEART CATH AND CORONARY ANGIOGRAPHY (N/A) as a surgical intervention.  The patient's history has been reviewed, patient examined, no change in status, stable for surgery.  I have reviewed the patient's chart and labs.  Questions were answered to the patient's satisfaction.     Arnoldo Lapping

## 2023-09-07 NOTE — H&P (View-Only) (Signed)
 Rounding Note    Patient Name: Susan [name] Date of Encounter: 09/07/2023   [facility] Cardiologist: Ahmad Alert, MD   Subjective   Breathing is much better. No chest pain this am.   Inpatient Medications    Scheduled Meds:  aspirin   81 mg Oral Daily   Chlorhexidine Gluconate Cloth  6 each Topical Daily   clopidogrel   75 mg Oral Daily   furosemide   40 mg Intravenous BID   levothyroxine   75 mcg Oral QAC breakfast   lidocaine   1 patch Transdermal Q24H   metoprolol  succinate  12.5 mg Oral Daily   ranolazine   500 mg Oral BID   rosuvastatin   40 mg Oral Daily   venlafaxine  XR  150 mg Oral Q breakfast   Continuous Infusions:  sodium chloride  1 mL/kg/hr (09/07/23 0535)   amiodarone 30 mg/hr (09/06/23 2212)   heparin  750 Units/hr (09/06/23 1500)   PRN Meds: mouth rinse   Vital Signs    Vitals:   09/07/23 0500 09/07/23 0600 09/07/23 0700 09/07/23 0803  BP: 117/89 116/75 114/69   Pulse: 79 77 81   Resp: (!) 31 (!) 36 (!) 22   Temp:    98.3 F (36.8 C)  TempSrc:    Oral  SpO2: 96% 98% 98%   Weight:      Height:        Intake/Output Summary (Last 24 hours) at 09/07/2023 0850 Last data filed at 09/06/2023 1710 Gross per 24 hour  Intake 465.61 ml  Output 450 ml  Net 15.61 ml      09/06/2023    4:13 PM 09/01/2023    3:55 AM 08/31/2023    3:10 PM  Last 3 Weights  Weight (lbs) 136 lb 11 oz 127 lb 13.9 oz 129 lb 13.6 oz  Weight (kg) 62 kg 58 kg 58.9 kg      Telemetry     Sinus- Personally Reviewed  ECG    No AM EKG- Personally Reviewed  Physical Exam   General: Well developed, well nourished, NAD  HEENT: OP clear, mucus membranes moist  SKIN: warm, dry. No rashes. Neuro: No focal deficits  Musculoskeletal: Muscle strength 5/5 all ext  Psychiatric: Mood and affect normal  Neck: No JVD, no carotid bruits, no thyromegaly, no lymphadenopathy.  Lungs:Clear bilaterally, no wheezes, rhonci, crackles Cardiovascular: Regular rate and rhythm. No  murmurs, gallops or rubs. Abdomen:Soft. Bowel sounds present. Non-tender.  Extremities: No lower extremity edema.    Labs    High Sensitivity Troponin:   Recent Labs  Lab 08/30/23 2138 08/30/23 2317 08/31/23 0443 09/05/23 1818 09/05/23 2016  TROPONINIHS 24* 25* 25* 20* 19*     Chemistry Recent Labs  Lab 08/31/23 1005 09/01/23 0251 09/05/23 1818 09/07/23 0331  NA 140 133* 136 138  K 2.9* 4.9 3.5 3.6  CL 112* 106 109 107  CO2 18* 20* 19* 21*  GLUCOSE 77 122* 124* 94  BUN 10 14 14 14   CREATININE 0.77 1.09* 1.11* 1.03*  CALCIUM  7.1* 8.8* 8.3* 7.9*  MG 1.8 2.3  --   --   PROT  --   --  6.0*  --   ALBUMIN 2.7* 3.4* 2.8*  --   AST  --   --  19  --   ALT  --   --  15  --   ALKPHOS  --   --  34*  --   BILITOT  --   --  0.5  --   GFRNONAA >  60 49* 48* 53*  ANIONGAP 10 7 8 10     Lipids  No results for input(s): "CHOL", "TRIG", "HDL", "LABVLDL", "LDLCALC", "CHOLHDL" in the last 168 hours.   Hematology Recent Labs  Lab 09/01/23 0251 09/05/23 1818 09/07/23 0331  WBC 10.4 9.9 8.1  RBC 3.99 3.37* 3.29*  HGB 12.9 11.1* 10.6*  HCT 38.7 33.7* 32.2*  MCV 97.0 100.0 97.9  MCH 32.3 32.9 32.2  MCHC 33.3 32.9 32.9  RDW 14.5 14.7 14.8  PLT 195 220 199   Thyroid  No results for input(s): "TSH", "FREET4" in the last 168 hours.  BNP Recent Labs  Lab 09/05/23 1818  BNP 1,134.1*    DDimer No results for input(s): "DDIMER" in the last 168 hours.   Radiology    DG Chest Port 1 View Result Date: 09/06/2023 CLINICAL DATA:  Acute respiratory distress EXAM: PORTABLE CHEST 1 VIEW COMPARISON:  Sep 04, 2024 FINDINGS: Worsening pulmonary infiltrates correlate with bilateral pulmonary edema and congestive changes with a small bilateral pleural effusions. Heart is slightly enlarged correlate with CHF Status post prior vertebroplasty upper thoracic spine vertebral body IMPRESSION: Worsening pulmonary infiltrates correlate with bilateral pulmonary edema and congestive changes with a small  bilateral pleural effusions. Electronically Signed   By: Fredrich Jefferson M.D.   On: 09/06/2023 14:29   DG Chest Portable 1 View Result Date: 09/05/2023 CLINICAL DATA:  Chest pain EXAM: PORTABLE CHEST 1 VIEW COMPARISON:  Chest x-ray 08/30/2023 FINDINGS: The heart is enlarged. There is no focal lung infiltrate, pleural effusion or pneumothorax. No acute fractures are seen. Vertebroplasty changes are again seen in the thoracic region. IMPRESSION: Cardiomegaly. No active disease. Electronically Signed   By: Tyron Gallon M.D.   On: 09/05/2023 19:29    Cardiac Studies     Patient Profile     89 y.o. female with CAD with prior RCA PCI, moderate mid LAD stenosis by cath in 2021, LBBB, HFrEF, PVCs, AI, MR and HLD admitted with chest pain. Negative troponin. Recent admission for the same symptoms last week. Her chest pain was not felt to be cardiac related last week. Rapid atrial fib on the night of 09/05/23 requiring amiodarone infusion and IV heparin . Now in sinus on 09/06/23.   Assessment & Plan    CAD with angina: She is known to have CAD with 70% mid LAD stenosis by cath in 2021 and patent RCA stent. Now with second admission for chest pain with negative troponin.  Will plan cardiac cath today (Right and left heart cath)  Continue ASA, Plavix , Ranexa , Toprol  and Crestor    I have reviewed the risks, indications, and alternatives to cardiac catheterization, possible angioplasty, and stenting with the patient. Risks include but are not limited to bleeding, infection, vascular injury, stroke, myocardial infection, arrhythmia, kidney injury, radiation-related injury in the case of prolonged fluoroscopy use, emergency cardiac surgery, and death. The patient understands the risks of serious complication is 1-2 in 1000 with diagnostic cardiac cath and 1-2% or less with angioplasty/stenting.  2. Atrial fib with RVR: Converted to sinus on amiodarone Will stop IV amiodarone and continue Toprol . Continue IV heparin   until cath and then will start a DOAC post cath.    3. HFrEF/Acute on chronic systolic CHF: Flash pulmonary edema yesterday. She has diuresed well. Will hold IV Lasix  today. Known non-ischemic CM. Continue beta blocker.   4. HLD: continue statin  For questions or updates, please contact [facility] Please consult [email or web address] for contact info under  Signed, Antoinette Batman, MD  09/07/2023, 8:50 AM

## 2023-09-08 ENCOUNTER — Encounter (HOSPITAL_COMMUNITY): Payer: Self-pay | Admitting: Cardiovascular Disease

## 2023-09-08 ENCOUNTER — Inpatient Hospital Stay (HOSPITAL_COMMUNITY)

## 2023-09-08 DIAGNOSIS — I5023 Acute on chronic systolic (congestive) heart failure: Secondary | ICD-10-CM | POA: Diagnosis not present

## 2023-09-08 LAB — CBC
HCT: 33.2 % — ABNORMAL LOW (ref 36.0–46.0)
Hemoglobin: 11 g/dL — ABNORMAL LOW (ref 12.0–15.0)
MCH: 31.9 pg (ref 26.0–34.0)
MCHC: 33.1 g/dL (ref 30.0–36.0)
MCV: 96.2 fL (ref 80.0–100.0)
Platelets: 215 10*3/uL (ref 150–400)
RBC: 3.45 MIL/uL — ABNORMAL LOW (ref 3.87–5.11)
RDW: 14.7 % (ref 11.5–15.5)
WBC: 6.9 10*3/uL (ref 4.0–10.5)
nRBC: 0 % (ref 0.0–0.2)

## 2023-09-08 LAB — ECHOCARDIOGRAM LIMITED
Height: 63 in
S' Lateral: 4.6 cm
Single Plane A4C EF: 25.8 %
Weight: 2186.96 [oz_av]

## 2023-09-08 LAB — GLUCOSE, CAPILLARY: Glucose-Capillary: 101 mg/dL — ABNORMAL HIGH (ref 70–99)

## 2023-09-08 MED ORDER — FUROSEMIDE 20 MG PO TABS
20.0000 mg | ORAL_TABLET | Freq: Once | ORAL | Status: DC
  Filled 2023-09-08: qty 1

## 2023-09-08 MED ORDER — PERFLUTREN LIPID MICROSPHERE
1.0000 mL | INTRAVENOUS | Status: AC | PRN
  Administered 2023-09-08: 2 mL via INTRAVENOUS

## 2023-09-08 MED ORDER — DAPAGLIFLOZIN PROPANEDIOL 10 MG PO TABS
10.0000 mg | ORAL_TABLET | Freq: Every day | ORAL | Status: DC
  Administered 2023-09-08 – 2023-09-09 (×2): 10 mg via ORAL
  Filled 2023-09-08 (×3): qty 1

## 2023-09-08 MED ORDER — POTASSIUM CHLORIDE CRYS ER 20 MEQ PO TBCR
40.0000 meq | EXTENDED_RELEASE_TABLET | Freq: Once | ORAL | Status: AC
  Administered 2023-09-08: 40 meq via ORAL
  Filled 2023-09-08: qty 2

## 2023-09-08 MED ORDER — FUROSEMIDE 20 MG PO TABS
20.0000 mg | ORAL_TABLET | Freq: Once | ORAL | Status: AC
  Administered 2023-09-08: 20 mg via ORAL
  Filled 2023-09-08: qty 1

## 2023-09-08 NOTE — Plan of Care (Signed)
  Problem: Activity: Goal: Risk for activity intolerance will decrease Outcome: Progressing   Problem: Nutrition: Goal: Adequate nutrition will be maintained Outcome: Progressing   Problem: Elimination: Goal: Will not experience complications related to bowel motility Outcome: Progressing   Problem: Pain Managment: Goal: General experience of comfort will improve and/or be controlled Outcome: Progressing

## 2023-09-08 NOTE — Discharge Instructions (Signed)

## 2023-09-08 NOTE — Progress Notes (Signed)
 Rounding Note    Patient Name: Susan [name] Date of Encounter: 09/08/2023  [facility] Cardiologist: Ahmad Alert, MD   Subjective   No chest pain or dyspnea.   Inpatient Medications    Scheduled Meds:  apixaban  2.5 mg Oral BID   aspirin   81 mg Oral Daily   Chlorhexidine Gluconate Cloth  6 each Topical Daily   levothyroxine   75 mcg Oral QAC breakfast   lidocaine   1 patch Transdermal Q24H   metoprolol  succinate  12.5 mg Oral Daily   ranolazine   500 mg Oral BID   rosuvastatin   40 mg Oral Daily   sodium chloride  flush  3 mL Intravenous Q12H   venlafaxine  XR  150 mg Oral Q breakfast   Continuous Infusions:  sodium chloride      PRN Meds: sodium chloride , acetaminophen , ondansetron  (ZOFRAN ) IV, mouth rinse, sodium chloride  flush   Vital Signs    Vitals:   09/08/23 0600 09/08/23 0700 09/08/23 0757 09/08/23 0800  BP: 126/81 129/89  126/80  Pulse: 91 91  92  Resp: (!) 25 (!) 26  16  Temp:   97.9 F (36.6 C)   TempSrc:   Oral   SpO2: 90% 92%  93%  Weight:      Height:        Intake/Output Summary (Last 24 hours) at 09/08/2023 0828 Last data filed at 09/08/2023 0400 Gross per 24 hour  Intake 480 ml  Output 925 ml  Net -445 ml      09/06/2023    4:13 PM 09/01/2023    3:55 AM 08/31/2023    3:10 PM  Last 3 Weights  Weight (lbs) 136 lb 11 oz 127 lb 13.9 oz 129 lb 13.6 oz  Weight (kg) 62 kg 58 kg 58.9 kg      Telemetry    Sinus - Personally Reviewed  ECG    No AM EKG- Personally Reviewed  Physical Exam   General: Well developed, well nourished, NAD  HEENT: OP clear, mucus membranes moist  SKIN: warm, dry. No rashes. Neuro: No focal deficits  Musculoskeletal: Muscle strength 5/5 all ext  Psychiatric: Mood and affect normal  Neck: No JVD, no carotid bruits, no thyromegaly, no lymphadenopathy.  Lungs:Clear bilaterally, no wheezes, rhonci, crackles Cardiovascular: Regular rate and rhythm. No murmurs, gallops or rubs. Abdomen:Soft. Bowel sounds  present. Non-tender.  Extremities: No lower extremity edema.   Labs    High Sensitivity Troponin:   Recent Labs  Lab 08/30/23 2138 08/30/23 2317 08/31/23 0443 09/05/23 1818 09/05/23 2016  TROPONINIHS 24* 25* 25* 20* 19*     Chemistry Recent Labs  Lab 09/05/23 1818 09/07/23 0331 09/07/23 1639  NA 136 138 136  K 3.5 3.6 3.5  CL 109 107 104  CO2 19* 21* 20*  GLUCOSE 124* 94 108*  BUN 14 14 16   CREATININE 1.11* 1.03* 1.01*  CALCIUM  8.3* 7.9* 8.0*  MG  --   --  1.9  PROT 6.0*  --   --   ALBUMIN 2.8*  --   --   AST 19  --   --   ALT 15  --   --   ALKPHOS 34*  --   --   BILITOT 0.5  --   --   GFRNONAA 48* 53* 54*  ANIONGAP 8 10 12     Lipids  No results for input(s): "CHOL", "TRIG", "HDL", "LABVLDL", "LDLCALC", "CHOLHDL" in the last 168 hours.   Hematology Recent Labs  Lab 09/05/23  1818 09/07/23 0331 09/08/23 0306  WBC 9.9 8.1 6.9  RBC 3.37* 3.29* 3.45*  HGB 11.1* 10.6* 11.0*  HCT 33.7* 32.2* 33.2*  MCV 100.0 97.9 96.2  MCH 32.9 32.2 31.9  MCHC 32.9 32.9 33.1  RDW 14.7 14.8 14.7  PLT 220 199 215   Thyroid  No results for input(s): "TSH", "FREET4" in the last 168 hours.  BNP Recent Labs  Lab 09/05/23 1818  BNP 1,134.1*    DDimer No results for input(s): "DDIMER" in the last 168 hours.   Radiology    CARDIAC CATHETERIZATION Result Date: 09/07/2023   The left ventricular ejection fraction is 35-45% by visual estimate. 1.  Moderately severe mid LAD stenosis, unchanged from previous cardiac catheterization studies 2.  Patent left main, left circumflex, and RCA with patent RCA stents and mild diffuse plaquing but no obstructive disease 3.  Elevated LVEDP of 21 mmHg 4.  At least moderate LV systolic dysfunction with severe hypokinesis of the periapical segments and hypokinesis of both the mid anterior and mid inferior walls, LVEF estimated at 35% Recommendations: Favor medical therapy.  The patient's coronary anatomy is completely stable from her previous  catheterization studies in 2021 in 2019.  The LAD is calcified and would likely require atherectomy.  The vessel is only moderate in caliber and considering the stability of the lesion I think ongoing medical treatment is appropriate.   DG Chest Port 1 View Result Date: 09/06/2023 CLINICAL DATA:  Acute respiratory distress EXAM: PORTABLE CHEST 1 VIEW COMPARISON:  Sep 04, 2024 FINDINGS: Worsening pulmonary infiltrates correlate with bilateral pulmonary edema and congestive changes with a small bilateral pleural effusions. Heart is slightly enlarged correlate with CHF Status post prior vertebroplasty upper thoracic spine vertebral body IMPRESSION: Worsening pulmonary infiltrates correlate with bilateral pulmonary edema and congestive changes with a small bilateral pleural effusions. Electronically Signed   By: Fredrich Jefferson M.D.   On: 09/06/2023 14:29    Cardiac Studies     Patient Profile     89 y.o. female with CAD with prior RCA PCI, moderate mid LAD stenosis by cath in 2021, LBBB, HFrEF, PVCs, AI, MR and HLD admitted with chest pain. Negative troponin. Recent admission for the same symptoms last week. Her chest pain was not felt to be cardiac related last week. Rapid atrial fib on the night of 09/05/23 requiring amiodarone infusion and IV heparin . Now in sinus on 09/06/23.   Assessment & Plan    CAD with angina: Cardiac cath 09/07/23 with stable CAD. Moderate LAD stenosis unchanged from last cath. Will continue ASA,  Toprol , Crestor  and Ranexa .    2. Atrial fib with RVR: Converted to sinus on amiodarone. Will continue Toprol  and Eliquis.     3. HFrEF/Acute on chronic systolic CHF: Volume status ok today. I think she will need low dose Lasix . LVEDP was 21 mmHg yesterday. Known non-ischemic CM.  Continue Toprol .  Lasix  20 mg po x 1 today Start Farxiga 10 mg daily  4. HLD: Continue statin  5. Deconditioning: Working with PT  For questions or updates, please contact [facility] Please  consult [email or web address] for contact info under      Signed, Antoinette Batman, MD  09/08/2023, 8:28 AM

## 2023-09-08 NOTE — Progress Notes (Signed)
 Heart Failure Navigator Progress Note  Assessed for Heart & Vascular TOC clinic readiness.  Patient does not meet criteria due to . Scheduled   [facility] appointment on 09/13/23.   Navigator will sign off at this time.   Randie Bustle, BSN, Scientist, clinical (histocompatibility and immunogenetics) Only

## 2023-09-08 NOTE — Evaluation (Signed)
 Physical Therapy Evaluation Patient Details Name: Susan [name] MRN: [id number] DOB: 11-23-1933 Today's Date: 09/08/2023  History of Present Illness  89 y.o. female with CAD s/p RCA PCI (2005), residual pLAD stenosis, HFrEF, LBBB, frequent PVCs, CAD s/p bilateral ICA (2019), pituitary adenoma, mild-mod AI, mod MR and HLD who was adm 5/5 for the evaluation of CP.  S/p R and L Heart Cath 5/6.   Clinical Impression  Pt admitted with above. Pt with report of feeling very sad and depressed regarding the inability to "fix my heart." Pt reports "I'm ready for Jesus to take me." RN aware of patients feelings. Pt did mobilize and ambulate 160' with hand held assist but required freq standing rest breaks due to onset of SOB and fatigue with report of feeling like she will pass out. Pt states at home she just lowers herself to the floor so she doesn't "fall and break something". Pt educated on benefits of rollator to aide in energy conservation and provide a safe place to sit during episodes of feeling like she is going to pass out. Also discussed staying with her son or daughter so she isn't alone and she stated she wanted to go into ALF. Acute PT to cont to follow.        If plan is discharge home, recommend the following: A little help with walking and/or transfers;A little help with bathing/dressing/bathroom;Assist for transportation;Help with stairs or ramp for entrance   Can travel by private vehicle        Equipment Recommendations Rollator (4 wheels) (however pt declines stating "I dont want one")  Recommendations for Other Services       Functional Status Assessment Patient has had a recent decline in their functional status and demonstrates the ability to make significant improvements in function in a reasonable and predictable amount of time.     Precautions / Restrictions Precautions Precautions: Fall Restrictions Weight Bearing Restrictions Per Provider Order: Yes Other  Position/Activity Restrictions: A-line removed and advised by Cardiac PA to limit pushing and pulling for 24 hours.  Weight restrictions for up to two weeks.      Mobility  Bed Mobility Overal bed mobility: Modified Independent Bed Mobility: Supine to Sit     Supine to sit: Modified independent (Device/Increase time)     General bed mobility comments: increased time, HOB slightly elevated, no physical assist needed    Transfers Overall transfer level: Needs assistance Equipment used: 1 person hand held assist Transfers: Sit to/from Stand, Bed to chair/wheelchair/BSC Sit to Stand: Contact guard assist           General transfer comment: pt reached for PT's hand for support but no physical assist needed    Ambulation/Gait Ambulation/Gait assistance: Min assist Gait Distance (Feet): 160 Feet Assistive device: 1 person hand held assist Gait Pattern/deviations: Step-through pattern, Decreased stride length, Shuffle Gait velocity: dec     General Gait Details: pt with short shuffled gait pattern. pt with 4 standing rest breaks to take deep breaths due to noted onset of SOB which causes anxiety to patient. VSS, SPO2 at 98% on RA, HR in 100s.  Stairs            Wheelchair Mobility     Tilt Bed    Modified Rankin (Stroke Patients Only)       Balance Overall balance assessment: Needs assistance Sitting-balance support: Feet supported Sitting balance-Leahy Scale: Good     Standing balance support: Single extremity supported Standing balance-Leahy Scale: Fair  Pertinent Vitals/Pain Pain Assessment Pain Assessment: No/denies pain    Home Living Family/patient expects to be discharged to:: Private residence Living Arrangements: Alone Available Help at Discharge: Family;Available PRN/intermittently Type of Home: House Home Access: Stairs to enter Entrance Stairs-Rails: Right;Left;Can reach both Entrance  Stairs-Number of Steps: 4   Home Layout: One level Home Equipment: None      Prior Function Prior Level of Function : Independent/Modified Independent;Driving             Mobility Comments: no AD, drives, walks alot since she had to give up tennis ADLs Comments: indep     Extremity/Trunk Assessment   Upper Extremity Assessment Upper Extremity Assessment: Generalized weakness    Lower Extremity Assessment Lower Extremity Assessment: Generalized weakness    Cervical / Trunk Assessment Cervical / Trunk Assessment: Normal  Communication   Communication Communication: No apparent difficulties    Cognition Arousal: Alert Behavior During Therapy: Lability                           PT - Cognition Comments: pt very emotional and reports being sad and depressed because "they can't fix her heart". Pt reports "I just wish Jesus would take me. I can't live like this." Pt with improved spirits after walking with PT however is overall depressed regarding medical prognosis Following commands: Intact       Cueing Cueing Techniques: Verbal cues     General Comments General comments (skin integrity, edema, etc.): VSS    Exercises     Assessment/Plan    PT Assessment Patient needs continued PT services  PT Problem List Decreased strength;Decreased activity tolerance;Decreased balance;Decreased mobility       PT Treatment Interventions DME instruction;Gait training;Stair training;Functional mobility training;Therapeutic activities;Therapeutic exercise    PT Goals (Current goals can be found in the Care Plan section)  Acute Rehab PT Goals PT Goal Formulation: With patient Time For Goal Achievement: 09/22/23 Potential to Achieve Goals: Fair    Frequency Min 2X/week     Co-evaluation               AM-PAC PT "6 Clicks" Mobility  Outcome Measure Help needed turning from your back to your side while in a flat bed without using bedrails?: None Help  needed moving from lying on your back to sitting on the side of a flat bed without using bedrails?: None Help needed moving to and from a bed to a chair (including a wheelchair)?: A Little Help needed standing up from a chair using your arms (e.g., wheelchair or bedside chair)?: A Little Help needed to walk in hospital room?: A Little Help needed climbing 3-5 steps with a railing? : A Lot 6 Click Score: 19    End of Session Equipment Utilized During Treatment: Gait belt Activity Tolerance: Patient limited by fatigue Patient left: in bed;with call bell/phone within reach (US  technician in room) Nurse Communication: Mobility status PT Visit Diagnosis: Unsteadiness on feet (R26.81)    Time: 1610-9604 PT Time Calculation (min) (ACUTE ONLY): 26 min   Charges:   PT Evaluation $PT Eval Low Complexity: 1 Low PT Treatments $Gait Training: 8-22 mins PT General Charges $$ ACUTE PT VISIT: 1 Visit         Renaee Caro, PT, DPT Acute Rehabilitation Services Secure chat preferred Office #: [phone]   Jenna Moan 09/08/2023, 9:50 AM

## 2023-09-08 NOTE — TOC Initial Note (Signed)
 Transition of Care [facility]) - Initial/Assessment Note    Patient Details  Name: Susan [name] MRN: [id number] Date of Birth: August 17, 1933  [facility] CM/SW Contact:    Benjiman Bras, RN Phone Number: [phone] 09/08/2023, 7:48 AM  Clinical Narrative:                 TOC CM spoke to pt and states she lives alone. Her daughter assist as needed. Gave permission to speak to dtr. States she prefers [facility]. Does not have DME and not needed in home.   Will continue to follow for dc needs.   Expected Discharge Plan: Home w Home Health Services Barriers to Discharge: Continued Medical Work up   Patient Goals and CMS Choice Patient states their goals for this hospitalization and ongoing recovery are:: wants to remain independent CMS Medicare.gov Compare Post Acute Care list provided to:: Patient Choice offered to / list presented to : Patient      Expected Discharge Plan and Services   Discharge Planning Services: CM Consult Post Acute Care Choice: Home Health Living arrangements for the past 2 months: Single Family Home Expected Discharge Date: 09/10/23                                    Prior Living Arrangements/Services Living arrangements for the past 2 months: Single Family Home Lives with:: Self Patient language and need for interpreter reviewed:: Yes Do you feel safe going back to the place where you live?: Yes      Need for Family Participation in Patient Care: No (Comment) Care giver support system in place?: Yes (comment)   Criminal Activity/Legal Involvement Pertinent to Current Situation/Hospitalization: No - Comment as needed  Activities of Daily Living   ADL Screening (condition at time of admission) Independently performs ADLs?: Yes (appropriate for developmental age) Is the patient deaf or have difficulty hearing?: No Does the patient have difficulty seeing, even when wearing glasses/contacts?: No Does the patient have  difficulty concentrating, remembering, or making decisions?: No  Permission Sought/Granted Permission sought to share information with : Case Manager, Family Supports, PCP Permission granted to share information with : Yes, Verbal Permission Granted        Permission granted to share info w Relationship: daughter     Emotional Assessment Appearance:: Appears younger than stated age Attitude/Demeanor/Rapport: Engaged Affect (typically observed): Accepting Orientation: : Oriented to Self, Oriented to Place, Oriented to  Time, Oriented to Situation   Psych Involvement: No (comment)  Admission diagnosis:  Unstable angina (HCC) [I20.0] Nonspecific chest pain [R07.9] Patient Active Problem List   Diagnosis Date Noted   Unstable angina (HCC) 09/05/2023   Weakness 07/16/2022   Osteoporosis 09/16/2021   Chest pain 03/21/2020   Syncope and collapse 11/13/2018   Syncope 11/12/2018   Fall 11/12/2018   Right clavicle fracture 11/12/2018   Chronic systolic CHF (congestive heart failure) (HCC) 02/11/2018   Coronary artery disease    Dyspnea on exertion 10/08/2017   Cardiomyopathy (HCC) 10/08/2017   Posterior vitreous detachment of both eyes 04/28/2016   Hyperlipidemia 07/18/2014   Nonexudative age-related macular degeneration 07/17/2014   Status post intraocular lens implant 07/17/2014   Pseudophakia 10/25/2013   Anxiety 10/11/2013   Mechanical complication of intraocular lens 08/12/2012   Nuclear sclerosis 08/12/2012   Posterior capsule opacification, right 08/12/2012   Macular degeneration 08/04/2011   Pituitary adenoma (HCC) 07/15/2011  Arteriosclerosis of coronary artery    Hypothyroidism    PCP:  Aldo Hun, MD Pharmacy:   [facility] [address], [place] - [address] [place] [address] Phone: [phone] Fax: [phone]     Social Drivers of Health (SDOH) Social History: SDOH Screenings   Food Insecurity: No Food  Insecurity (09/06/2023)  Housing: Low Risk  (09/06/2023)  Transportation Needs: No Transportation Needs (09/06/2023)  Utilities: Not At Risk (09/06/2023)  Social Connections: Moderately Integrated (09/06/2023)  Tobacco Use: Low Risk  (09/05/2023)   SDOH Interventions:     Readmission Risk Interventions     No data to display

## 2023-09-09 ENCOUNTER — Telehealth: Payer: Self-pay | Admitting: Cardiology

## 2023-09-09 ENCOUNTER — Other Ambulatory Visit (HOSPITAL_COMMUNITY): Payer: Self-pay

## 2023-09-09 DIAGNOSIS — I25118 Atherosclerotic heart disease of native coronary artery with other forms of angina pectoris: Secondary | ICD-10-CM

## 2023-09-09 DIAGNOSIS — I480 Paroxysmal atrial fibrillation: Secondary | ICD-10-CM

## 2023-09-09 LAB — CBC
HCT: 33.7 % — ABNORMAL LOW (ref 36.0–46.0)
Hemoglobin: 11 g/dL — ABNORMAL LOW (ref 12.0–15.0)
MCH: 31.9 pg (ref 26.0–34.0)
MCHC: 32.6 g/dL (ref 30.0–36.0)
MCV: 97.7 fL (ref 80.0–100.0)
Platelets: 247 10*3/uL (ref 150–400)
RBC: 3.45 MIL/uL — ABNORMAL LOW (ref 3.87–5.11)
RDW: 14.8 % (ref 11.5–15.5)
WBC: 6.8 10*3/uL (ref 4.0–10.5)
nRBC: 0.3 % — ABNORMAL HIGH (ref 0.0–0.2)

## 2023-09-09 LAB — GLUCOSE, CAPILLARY
Glucose-Capillary: 103 mg/dL — ABNORMAL HIGH (ref 70–99)
Glucose-Capillary: 110 mg/dL — ABNORMAL HIGH (ref 70–99)

## 2023-09-09 MED ORDER — APIXABAN 2.5 MG PO TABS
2.5000 mg | ORAL_TABLET | Freq: Two times a day (BID) | ORAL | 3 refills | Status: AC
  Filled 2023-09-09 – 2024-03-06 (×2): qty 60, 30d supply, fill #0
  Filled 2024-03-06 (×2): qty 28, 14d supply, fill #0
  Filled 2024-03-06 (×2): qty 60, 30d supply, fill #0

## 2023-09-09 MED ORDER — METOPROLOL SUCCINATE ER 25 MG PO TB24
12.5000 mg | ORAL_TABLET | Freq: Every day | ORAL | 3 refills | Status: AC
Start: 2023-09-09 — End: ?
  Filled 2023-09-09: qty 30, 60d supply, fill #0

## 2023-09-09 MED ORDER — ASPIRIN 81 MG PO CHEW
81.0000 mg | CHEWABLE_TABLET | Freq: Every day | ORAL | 0 refills | Status: DC
  Filled 2023-09-09: qty 30, 30d supply, fill #0

## 2023-09-09 MED ORDER — DAPAGLIFLOZIN PROPANEDIOL 10 MG PO TABS
10.0000 mg | ORAL_TABLET | Freq: Every day | ORAL | 3 refills | Status: AC
Start: 2023-09-09 — End: ?
  Filled 2023-09-09 – 2024-03-06 (×2): qty 30, 30d supply, fill #0
  Filled 2024-03-06: qty 14, 14d supply, fill #0
  Filled 2024-03-23 – 2024-04-06 (×2): qty 30, 30d supply, fill #0

## 2023-09-09 NOTE — Telephone Encounter (Signed)
 Patient contacted regarding discharge from [facility] on 09/09/2023  Patient understands to follow up with provider Dr. Alroy Aspen on 09/13/2023 at 01:40 pm  at [facility]. Patient understands discharge instructions? Yes Patient understands medications and regiment? Yes Patient understands to bring all medications to this visit? Yes  Ask patient:  Are you enrolled in My Chart Yes

## 2023-09-09 NOTE — Progress Notes (Signed)
 Rounding Note    Patient Name: Susan [name] Date of Encounter: 09/09/2023  [facility] Cardiologist: Ahmad Alert, MD   Subjective   No chest pain  Inpatient Medications    Scheduled Meds:  apixaban  2.5 mg Oral BID   aspirin   81 mg Oral Daily   Chlorhexidine Gluconate Cloth  6 each Topical Daily   dapagliflozin propanediol  10 mg Oral Daily   levothyroxine   75 mcg Oral QAC breakfast   lidocaine   1 patch Transdermal Q24H   metoprolol  succinate  12.5 mg Oral Daily   ranolazine   500 mg Oral BID   rosuvastatin   40 mg Oral Daily   sodium chloride  flush  3 mL Intravenous Q12H   venlafaxine  XR  150 mg Oral Q breakfast   Continuous Infusions:   PRN Meds: acetaminophen , ondansetron  (ZOFRAN ) IV, mouth rinse, sodium chloride  flush   Vital Signs    Vitals:   09/08/23 2336 09/09/23 0300 09/09/23 0313 09/09/23 0809  BP: 120/75  129/84 109/79  Pulse:      Resp: 20 (!) 25 20   Temp: 98.2 F (36.8 C)  97.7 F (36.5 C) (!) 97.5 F (36.4 C)  TempSrc: Oral  Oral Oral  SpO2: 96%     Weight:  60.1 kg    Height:        Intake/Output Summary (Last 24 hours) at 09/09/2023 0821 Last data filed at 09/08/2023 1000 Gross per 24 hour  Intake 240 ml  Output --  Net 240 ml      09/09/2023    3:00 AM 09/06/2023    4:13 PM 09/01/2023    3:55 AM  Last 3 Weights  Weight (lbs) 132 lb 6.4 oz 136 lb 11 oz 127 lb 13.9 oz  Weight (kg) 60.056 kg 62 kg 58 kg      Telemetry    Sinus- Personally Reviewed  ECG    No AM EKG- Personally Reviewed  Physical Exam   General: Well developed, well nourished, NAD  SKIN: warm, dry. No rashes. Neuro: No focal deficits  Musculoskeletal: Muscle strength 5/5 all ext  Psychiatric: Mood and affect normal  Neck: No JVD,  Lungs:Clear bilaterally, no wheezes, rhonci, crackles Cardiovascular: Regular rate and rhythm. No murmurs, gallops or rubs. Abdomen:Soft. Extremities: No lower extremity edema.   Labs    High Sensitivity  Troponin:   Recent Labs  Lab 08/30/23 2138 08/30/23 2317 08/31/23 0443 09/05/23 1818 09/05/23 2016  TROPONINIHS 24* 25* 25* 20* 19*     Chemistry Recent Labs  Lab 09/05/23 1818 09/07/23 0331 09/07/23 1639  NA 136 138 136  K 3.5 3.6 3.5  CL 109 107 104  CO2 19* 21* 20*  GLUCOSE 124* 94 108*  BUN 14 14 16   CREATININE 1.11* 1.03* 1.01*  CALCIUM  8.3* 7.9* 8.0*  MG  --   --  1.9  PROT 6.0*  --   --   ALBUMIN 2.8*  --   --   AST 19  --   --   ALT 15  --   --   ALKPHOS 34*  --   --   BILITOT 0.5  --   --   GFRNONAA 48* 53* 54*  ANIONGAP 8 10 12     Lipids  No results for input(s): "CHOL", "TRIG", "HDL", "LABVLDL", "LDLCALC", "CHOLHDL" in the last 168 hours.   Hematology Recent Labs  Lab 09/07/23 0331 09/08/23 0306 09/09/23 0415  WBC 8.1 6.9 6.8  RBC 3.29* 3.45* 3.45*  HGB 10.6* 11.0* 11.0*  HCT 32.2* 33.2* 33.7*  MCV 97.9 96.2 97.7  MCH 32.2 31.9 31.9  MCHC 32.9 33.1 32.6  RDW 14.8 14.7 14.8  PLT 199 215 247   Thyroid  No results for input(s): "TSH", "FREET4" in the last 168 hours.  BNP Recent Labs  Lab 09/05/23 1818  BNP 1,134.1*    DDimer No results for input(s): "DDIMER" in the last 168 hours.   Radiology    ECHOCARDIOGRAM LIMITED Result Date: 09/08/2023    ECHOCARDIOGRAM LIMITED REPORT   Patient Name:   MACKENZE KOZUCH Date of Exam: 09/08/2023 Medical Rec #:  [id number]        Height:       63.0 in Accession #:    [id number]       Weight:       136.7 lb Date of Birth:  06/18/1933         BSA:          1.645 m Patient Age:    89 years         BP:           126/80 mmHg Patient Gender: F                HR:           93 bpm. Exam Location:  Inpatient Procedure: Limited Echo and Intracardiac Opacification Agent (Both Spectral and            Color Flow Doppler were utilized during procedure). Indications:    CHF  History:        Patient has prior history of Echocardiogram examinations, most                 recent 08/06/2023.  Sonographer:    Hersey Lorenzo RDCS  Referring Phys: [phone] MICHAEL COOPER IMPRESSIONS  1. Left ventricular ejection fraction, by estimation, is 25 to 30%. The left ventricle has severely decreased function. The left ventricle demonstrates global hypokinesis. There is mild left ventricular hypertrophy.  2. The aortic valve was not well visualized.  3. The inferior vena cava is normal in size with <50% respiratory variability, suggesting right atrial pressure of 8 mmHg. FINDINGS  Left Ventricle: Left ventricular ejection fraction, by estimation, is 25 to 30%. The left ventricle has severely decreased function. The left ventricle demonstrates global hypokinesis. Definity contrast agent was given IV to delineate the left ventricular endocardial borders. There is mild left ventricular hypertrophy. Mitral Valve: Mild mitral annular calcification. Aortic Valve: The aortic valve was not well visualized. Venous: The inferior vena cava is normal in size with less than 50% respiratory variability, suggesting right atrial pressure of 8 mmHg. LEFT VENTRICLE PLAX 2D LVIDd:         5.50 cm LVIDs:         4.60 cm LV PW:         1.20 cm LV IVS:        1.20 cm  LV Volumes (MOD) LV vol d, MOD A4C: 135.0 ml LV vol s, MOD A4C: 100.2 ml LV SV MOD A4C:     135.0 ml Dorothye Gathers MD Electronically signed by Dorothye Gathers MD Signature Date/Time: 09/08/2023/12:46:20 PM    Final    CARDIAC CATHETERIZATION Result Date: 09/07/2023   The left ventricular ejection fraction is 35-45% by visual estimate. 1.  Moderately severe mid LAD stenosis, unchanged from previous cardiac catheterization studies 2.  Patent left main, left circumflex, and RCA with patent RCA stents  and mild diffuse plaquing but no obstructive disease 3.  Elevated LVEDP of 21 mmHg 4.  At least moderate LV systolic dysfunction with severe hypokinesis of the periapical segments and hypokinesis of both the mid anterior and mid inferior walls, LVEF estimated at 35% Recommendations: Favor medical therapy.  The patient's  coronary anatomy is completely stable from her previous catheterization studies in 2021 in 2019.  The LAD is calcified and would likely require atherectomy.  The vessel is only moderate in caliber and considering the stability of the lesion I think ongoing medical treatment is appropriate.    Cardiac Studies     Patient Profile     89 y.o. female with CAD with prior RCA PCI, moderate mid LAD stenosis by cath in 2021, LBBB, HFrEF, PVCs, AI, MR and HLD admitted with chest pain. Negative troponin. Recent admission for the same symptoms last week. Her chest pain was not felt to be cardiac related last week. Rapid atrial fib on the night of 09/05/23 requiring amiodarone infusion and IV heparin . Now in sinus on 09/06/23.   Assessment & Plan    CAD with angina: Cardiac cath 09/07/23 with stable CAD. Moderate LAD stenosis unchanged from last cath. No chest pain today.  Continue ASA, Toprol , Crestor  and Ranexa .     Atrial fib with RVR: Converted to sinus on amiodarone. Sinus this am.  Continue Toprol  and Eliquis   HFrEF/Acute on chronic systolic CHF: Volume status ok today. She received one dose of Lasix  yesterday and Farxiga was started. Known non-ischemic CM.  Continue Toprol  Continue Farxiga.  HLD: Continue statin  Deconditioning: Working with PT  Dispo: d/c home today. Keep f/u appt with Dr. Alroy Aspen next week on 09/13/23.    For questions or updates, please contact [facility] Please consult [email or web address] for contact info under      Signed, Antoinette Batman, MD  09/09/2023, 8:21 AM

## 2023-09-09 NOTE — Progress Notes (Signed)
 Mobility Specialist Progress Note:   09/09/23 4098  Mobility  Activity Ambulated with assistance in room;Ambulated with assistance in hallway  Level of Assistance Modified independent, requires aide device or extra time  Assistive Device Other (Comment) (hand rails)  Distance Ambulated (ft) 220 ft  Activity Response Tolerated well  Mobility Referral Yes  Mobility visit 1 Mobility  Mobility Specialist Start Time (ACUTE ONLY) [id number]  Mobility Specialist Stop Time (ACUTE ONLY) 0933  Mobility Specialist Time Calculation (min) (ACUTE ONLY) 11 min   Pt received in chair, agreeable to mobility session. Ambulated in room and hallway with ModI using hand rails. Tolerated well, asx throughout, VSS. Returned pt to chair in room, alarm on, call bell and phone in reach. Pt sitting up comfortably eating breakfast. Left with all needs met.    Cesiah Westley Mobility Specialist Please contact via Special educational needs teacher or  Rehab office at [phone]

## 2023-09-09 NOTE — Discharge Summary (Addendum)
 Discharge Summary    Patient ID: Susan [name] MRN: [id number]; DOB: 07/18/1933  Admit date: 09/05/2023 Discharge date: 09/09/2023  PCP:  Aldo Hun, MD   [facility] Providers Cardiologist:  Ahmad Alert, MD        Discharge Diagnoses    Principal Problem:   Unstable angina [facility])    Diagnostic Studies/Procedures    09/09/23 Limited TTE  IMPRESSIONS     1. Left ventricular ejection fraction, by estimation, is 25 to 30%. The  left ventricle has severely decreased function. The left ventricle  demonstrates global hypokinesis. There is mild left ventricular  hypertrophy.   2. The aortic valve was not well visualized.   3. The inferior vena cava is normal in size with <50% respiratory  variability, suggesting right atrial pressure of 8 mmHg.   FINDINGS   Left Ventricle: Left ventricular ejection fraction, by estimation, is 25  to 30%. The left ventricle has severely decreased function. The left  ventricle demonstrates global hypokinesis. Definity contrast agent was  given IV to delineate the left  ventricular endocardial borders. There is mild left ventricular  hypertrophy.   Mitral Valve: Mild mitral annular calcification.   Aortic Valve: The aortic valve was not well visualized.   Venous: The inferior vena cava is normal in size with less than 50%  respiratory variability, suggesting right atrial pressure of 8 mmHg.   09/07/23 LHC    The left ventricular ejection fraction is 35-45% by visual estimate.   1.  Moderately severe mid LAD stenosis, unchanged from previous cardiac catheterization studies 2.  Patent left main, left circumflex, and RCA with patent RCA stents and mild diffuse plaquing but no obstructive disease 3.  Elevated LVEDP of 21 mmHg 4.  At least moderate LV systolic dysfunction with severe hypokinesis of the periapical segments and hypokinesis of both the mid anterior and mid inferior walls, LVEF estimated at 35%   Recommendations:  Favor medical therapy.  The patient's coronary anatomy is completely stable from her previous catheterization studies in 2021 in 2019.  The LAD is calcified and would likely require atherectomy.  The vessel is only moderate in caliber and considering the stability of the lesion I think ongoing medical treatment is appropriate.  Diagnostic Dominance: Right  _____________   History of Present Illness     Susan [name] is a 89 y.o. female with CAD s/p RCA PCI (2005), residual pLAD stenosis, HFrEF, LBBB, frequent PVCs, CAD s/p bilateral ICA (2019), pituitary adenoma, mild-mod AI, mod MR and HLD. Patient had onset of chest pain on the morning of 5/4, called EMS.   Patient's recent history notable for recurrent chest pain. She was just discharged on 09/01/23 for similar sx.  ECG at that time without any signs of ischemia and hsT mildly elevated but flat (24->25->25). BNP above prior baseline (859 from 200s). She was started on Ranexa  and Toprol  XL although it was felt the chest pain was likely non-anginal.  Hospital Course     Consultants: n/a   Chest pain CAD s/p RCA PCI pLAD stenosis Patient with repeat admission for evaluation of chest pain (previous admission it was not felt that pain consistent with cardiac cause). Troponin this admission 20->19. LHC on 5/6 with stable CAD. Moderate LAD stenosis unchanged from last cath.  Continue ASA 81mg  (no Plavix  with new Eliquis). Consider discontinuing ASA at one month.  Continue Toprol  XL 12.5mg  Continue Crestor  40mg  Continue Ranexa  500mg  q12hr.  Paroxysmal atrial fibrillation with RVR Patient  with new onset afib this admission, symptomatic with shortness of breath. She received IV Lopressor  and Amiodarone bolus/infusion and converted to NSR. IV Amiodarone and IV heparin  continued through her LHC on 5/6, then Amiodarone stopped. Patient started on Eliquis 2.5mg  on the evening of 5/6. Remains in NSR on day of discharge. Continue Eliquis 2.5mg  BID  (62F, sCr 1.1, 58 kg) Continue Toprol  XL 12.5mg  daily  Acute on chronic HFrEF Patient with known systolic CHF noted to have BNP 1134.1 on admission. Patient with onset of acute dyspnea on 5/5, consistent with flash pulmonary edema. She received STAT IV Lasix , supplemental O2, and was transferred to CVICU. Breathing much improved following diuresis. LVEDP on LHC on 5/6 and patient given additional dose of IV lasix , started on Farxiga 10mg . TTE with LVEF 25-30%, global hypokinesis. On day of discharge, patient seen by Dr. Abel Hoe with stable volume status. Continue Toprol  XL 12.5mg  Continue Farxiga 10mg .  Consider ACEi/ARB/ARNI +/- MRA in outpatient setting if BP allows.   Hyperlipidemia Continue Crestor  40mg   Hypothyroidism Continue home Synthroid       Did the patient have an acute coronary syndrome (MI, NSTEMI, STEMI, etc) this admission?:  No.   The elevated Troponin was due to the acute medical illness (demand ischemia).       The patient will be scheduled for a TOC follow up appointment in 7 days.  A message has been sent to the [facility] and Scheduling Pool at the office where the patient should be seen for follow up.  _____________  Discharge Vitals Blood pressure 109/79, pulse (!) 101, temperature (!) 97.5 F (36.4 C), temperature source Oral, resp. rate 20, height 5\' 3"  (1.6 m), weight 60.1 kg, SpO2 96%.  Filed Weights   09/06/23 1613 09/09/23 0300  Weight: 62 kg 60.1 kg    Labs & Radiologic Studies    CBC Recent Labs    09/08/23 0306 09/09/23 0415  WBC 6.9 6.8  HGB 11.0* 11.0*  HCT 33.2* 33.7*  MCV 96.2 97.7  PLT 215 247   Basic Metabolic Panel Recent Labs    29/56/21 0331 09/07/23 1639  NA 138 136  K 3.6 3.5  CL 107 104  CO2 21* 20*  GLUCOSE 94 108*  BUN 14 16  CREATININE 1.03* 1.01*  CALCIUM  7.9* 8.0*  MG  --  1.9   Liver Function Tests No results for input(s): "AST", "ALT", "ALKPHOS", "BILITOT", "PROT", "ALBUMIN" in the last 72  hours. No results for input(s): "LIPASE", "AMYLASE" in the last 72 hours. High Sensitivity Troponin:   Recent Labs  Lab 08/30/23 2138 08/30/23 2317 08/31/23 0443 09/05/23 1818 09/05/23 2016  TROPONINIHS 24* 25* 25* 20* 19*    BNP Invalid input(s): "POCBNP" D-Dimer No results for input(s): "DDIMER" in the last 72 hours. Hemoglobin A1C Recent Labs    09/07/23 0331  HGBA1C 5.5   Fasting Lipid Panel No results for input(s): "CHOL", "HDL", "LDLCALC", "TRIG", "CHOLHDL", "LDLDIRECT" in the last 72 hours. Thyroid  Function Tests No results for input(s): "TSH", "T4TOTAL", "T3FREE", "THYROIDAB" in the last 72 hours.  Invalid input(s): "FREET3" _____________  ECHOCARDIOGRAM LIMITED Result Date: 09/08/2023    ECHOCARDIOGRAM LIMITED REPORT   Patient Name:   MAZIE LASSETER Date of Exam: 09/08/2023 Medical Rec #:  [id number]        Height:       63.0 in Accession #:    [id number]       Weight:       136.7 lb Date of Birth:  04/23/1934         BSA:          1.645 m Patient Age:    89 years         BP:           126/80 mmHg Patient Gender: F                HR:           93 bpm. Exam Location:  Inpatient Procedure: Limited Echo and Intracardiac Opacification Agent (Both Spectral and            Color Flow Doppler were utilized during procedure). Indications:    CHF  History:        Patient has prior history of Echocardiogram examinations, most                 recent 08/06/2023.  Sonographer:    Hersey Lorenzo RDCS Referring Phys: [id number] MICHAEL COOPER IMPRESSIONS  1. Left ventricular ejection fraction, by estimation, is 25 to 30%. The left ventricle has severely decreased function. The left ventricle demonstrates global hypokinesis. There is mild left ventricular hypertrophy.  2. The aortic valve was not well visualized.  3. The inferior vena cava is normal in size with <50% respiratory variability, suggesting right atrial pressure of 8 mmHg. FINDINGS  Left Ventricle: Left ventricular ejection fraction, by  estimation, is 25 to 30%. The left ventricle has severely decreased function. The left ventricle demonstrates global hypokinesis. Definity contrast agent was given IV to delineate the left ventricular endocardial borders. There is mild left ventricular hypertrophy. Mitral Valve: Mild mitral annular calcification. Aortic Valve: The aortic valve was not well visualized. Venous: The inferior vena cava is normal in size with less than 50% respiratory variability, suggesting right atrial pressure of 8 mmHg. LEFT VENTRICLE PLAX 2D LVIDd:         5.50 cm LVIDs:         4.60 cm LV PW:         1.20 cm LV IVS:        1.20 cm  LV Volumes (MOD) LV vol d, MOD A4C: 135.0 ml LV vol s, MOD A4C: 100.2 ml LV SV MOD A4C:     135.0 ml Dorothye Gathers MD Electronically signed by Dorothye Gathers MD Signature Date/Time: 09/08/2023/12:46:20 PM    Final    CARDIAC CATHETERIZATION Result Date: 09/07/2023   The left ventricular ejection fraction is 35-45% by visual estimate. 1.  Moderately severe mid LAD stenosis, unchanged from previous cardiac catheterization studies 2.  Patent left main, left circumflex, and RCA with patent RCA stents and mild diffuse plaquing but no obstructive disease 3.  Elevated LVEDP of 21 mmHg 4.  At least moderate LV systolic dysfunction with severe hypokinesis of the periapical segments and hypokinesis of both the mid anterior and mid inferior walls, LVEF estimated at 35% Recommendations: Favor medical therapy.  The patient's coronary anatomy is completely stable from her previous catheterization studies in 2021 in 2019.  The LAD is calcified and would likely require atherectomy.  The vessel is only moderate in caliber and considering the stability of the lesion I think ongoing medical treatment is appropriate.   DG Chest Port 1 View Result Date: 09/06/2023 CLINICAL DATA:  Acute respiratory distress EXAM: PORTABLE CHEST 1 VIEW COMPARISON:  Sep 04, 2024 FINDINGS: Worsening pulmonary infiltrates correlate with bilateral  pulmonary edema and congestive changes with a small bilateral pleural effusions. Heart is slightly enlarged correlate with  CHF Status post prior vertebroplasty upper thoracic spine vertebral body IMPRESSION: Worsening pulmonary infiltrates correlate with bilateral pulmonary edema and congestive changes with a small bilateral pleural effusions. Electronically Signed   By: Fredrich Jefferson M.D.   On: 09/06/2023 14:29   DG Chest Portable 1 View Result Date: 09/05/2023 CLINICAL DATA:  Chest pain EXAM: PORTABLE CHEST 1 VIEW COMPARISON:  Chest x-ray 08/30/2023 FINDINGS: The heart is enlarged. There is no focal lung infiltrate, pleural effusion or pneumothorax. No acute fractures are seen. Vertebroplasty changes are again seen in the thoracic region. IMPRESSION: Cardiomegaly. No active disease. Electronically Signed   By: Tyron Gallon M.D.   On: 09/05/2023 19:29   LONG TERM MONITOR (3-14 DAYS) Result Date: 08/31/2023   Predominant rhythm is sinus rhythm.   She had 2 episodes of nonsustained ventricular tachycardia.  The fastest interval lasted 8 beats with a maximal heart rate of 207.  The longest episode lasted 7 beats with an average heart rate of 173.  Neither of these were symptomatic.   She had 21 beats of supraventricular tachycardia (nonsustained).  The fastest and longest interval lasted 9 beats with a maximum heart rate of 185 and an average heart rate of 148.   Occasional premature ventricular contractions, rare premature atrial contractions Patch Wear Time:  10 days and 2 hours (2025-04-15T14:26:27-0400 to 2025-04-25T17:19:53-0400) Patient had a min HR of 66 bpm, max HR of 207 bpm, and avg HR of 91 bpm. Predominant underlying rhythm was Sinus Rhythm. Bundle Branch Block/IVCD was present. 2 Ventricular Tachycardia runs occurred, the run with the fastest interval lasting 8 beats with  a max rate of 207 bpm, the longest lasting 7 beats with an avg rate of 173 bpm. 21 Supraventricular Tachycardia runs occurred,  the run with the fastest interval lasting 9 beats with a max rate of 185 bpm (avg 148 bpm); the run with the fastest interval was also the longest. Isolated SVEs were occasional (3.6%, 36157), SVE Couplets were rare (<1.0%, 910), and SVE Triplets were rare (<1.0%, 119). Isolated VEs were occasional (3.1%, 31021), VE Couplets were rare (<1.0%, 1032), and VE Triplets were rare (<1.0%, 22). Ventricular Bigeminy and Trigeminy were present.   CT Angio Chest PE W/Cm &/Or Wo Cm Result Date: 08/30/2023 CLINICAL DATA:  Pulmonary embolism (PE) suspected, high prob, chest pain, lightheadedness EXAM: CT ANGIOGRAPHY CHEST WITH CONTRAST TECHNIQUE: Multidetector CT imaging of the chest was performed using the standard protocol during bolus administration of intravenous contrast. Multiplanar CT image reconstructions and MIPs were obtained to evaluate the vascular anatomy. RADIATION DOSE REDUCTION: This exam was performed according to the departmental dose-optimization program which includes automated exposure control, adjustment of the mA and/or kV according to patient size and/or use of iterative reconstruction technique. CONTRAST:  75mL OMNIPAQUE  IOHEXOL  350 MG/ML SOLN COMPARISON:  03/06/2023, 08/30/2023 FINDINGS: Cardiovascular: This is a technically adequate evaluation of pulmonary vasculature. No filling defects or pulmonary emboli. Heart is enlarged without pericardial effusion. Prominent left ventricular dilatation. Calcifications of the mitral and aortic valves. Normal caliber of the thoracic aorta. Atherosclerosis of the aorta and coronary vasculature. Mediastinum/Nodes: No enlarged mediastinal, hilar, or axillary lymph nodes. Thyroid  gland, trachea, and esophagus demonstrate no significant findings. Lungs/Pleura: No acute airspace disease, effusion, or pneumothorax. Stable bibasilar scarring. Central airways are patent. 4 mm right middle lobe pulmonary nodule reference image 94/6. Upper Abdomen: No acute upper  abdominal findings. Musculoskeletal: No acute or destructive bony abnormalities. Reconstructed images demonstrate chronic T6 compression fracture and prior vertebral augmentation. Review  of the MIP images confirms the above findings. IMPRESSION: 1. No evidence of pulmonary embolus. 2. Cardiomegaly, with prominent left ventricular dilatation. 3. Right solid pulmonary nodule measuring 4 mm. Per Fleischner Society Guidelines, no routine follow-up imaging is recommended. These guidelines do not apply to immunocompromised patients and patients with cancer. Follow up in patients with significant comorbidities as clinically warranted. For lung cancer screening, adhere to Lung-RADS guidelines. Reference: Radiology. 2017; 284(1):228-43. 4. Aortic Atherosclerosis (ICD10-I70.0). Coronary artery atherosclerosis. Electronically Signed   By: Bobbye Burrow M.D.   On: 08/30/2023 23:58   DG Chest 2 View Result Date: 08/30/2023 CLINICAL DATA:  Chest pain and lightheadedness. EXAM: CHEST - 2 VIEW COMPARISON:  Portable chest 07/15/2022. FINDINGS: There is mild cardiomegaly with no evidence of CHF. Stable mediastinum with aortic atherosclerosis. Areas of linear atelectasis are noted in the lateral base of the lungs. There is no convincing focal infiltrate. The remaining lungs are clear. Wedging and kyphoplasty in 1 midthoracic segment again noted with osteopenia and degenerative change. IMPRESSION: No evidence of acute chest disease. Lateral basal areas of linear atelectasis. Mild cardiomegaly. Aortic atherosclerosis. Electronically Signed   By: Denman Fischer M.D.   On: 08/30/2023 21:57   Disposition   Pt is being discharged home today in good condition.  Follow-up Plans & Appointments     Follow-up Information     Aldo Hun, MD Follow up.   Specialty: Internal Medicine Why: please contact PCP to schedule hospital follow up appt to be seen in 7-14 days Contact information: [address] [place]  [address] [phone]                   Discharge Medications   Allergies as of 09/09/2023   No Known Allergies      Medication List     STOP taking these medications    clopidogrel  75 MG tablet Commonly known as: PLAVIX        TAKE these medications    acetaminophen  500 MG tablet Commonly known as: TYLENOL  Take 2 tablets (1,000 mg total) by mouth every 8 (eight) hours for 4 days, THEN 2 tablets (1,000 mg total) every 8 (eight) hours as needed for up to 15 days. Start taking on: September 01, 2023   apixaban 2.5 MG Tabs tablet Commonly known as: ELIQUIS Take 1 tablet (2.5 mg total) by mouth 2 (two) times daily.   aspirin  81 MG chewable tablet Chew 1 tablet (81 mg total) by mouth daily.   CALCIUM  + D PO Take 1 tablet by mouth 2 (two) times daily.   cholecalciferol  25 MCG (1000 UNIT) tablet Commonly known as: VITAMIN D3 Take 1,000 Units by mouth daily after breakfast.   cyanocobalamin  1000 MCG tablet Commonly known as: VITAMIN B12 Take 2,000 mcg by mouth daily.   dapagliflozin propanediol 10 MG Tabs tablet Commonly known as: FARXIGA Take 1 tablet (10 mg total) by mouth daily.   denosumab  60 MG/ML Sosy injection Commonly known as: PROLIA  Inject 60 mg into the skin every 6 (six) months.   levothyroxine  75 MCG tablet Commonly known as: SYNTHROID  Take 75 mcg by mouth daily before breakfast.   metoprolol  succinate 25 MG 24 hr tablet Commonly known as: TOPROL -XL Take 0.5 tablets (12.5 mg total) by mouth daily.   multivitamin-lutein Caps capsule Take 1 capsule by mouth 2 (two) times a day.   nitroGLYCERIN  0.4 MG SL tablet Commonly known as: NITROSTAT  Place 1 tablet (0.4 mg total) under the tongue every 5 (five) minutes as needed.  What changed: reasons to take this   ranolazine  500 MG 12 hr tablet Commonly known as: RANEXA  Take 1 tablet (500 mg total) by mouth 2 (two) times daily.   rosuvastatin  40 MG tablet Commonly known as: CRESTOR  Take 1 tablet  (40 mg total) by mouth daily.   venlafaxine  XR 150 MG 24 hr capsule Commonly known as: EFFEXOR -XR Take 1 capsule (150 mg total) by mouth daily with breakfast.           Outstanding Labs/Studies    Duration of Discharge Encounter: APP Time: 20 minutes   Signed, Leala Prince, PA-C 09/09/2023, 9:43 AM  I have personally seen and examined this patient. I agree with the assessment and plan as outlined above.  I saw this patient alone today.  25 minutes spent by me on note review, chart review, tele review, lab review, examination and plan formulation and note.  Here is my plan from my note that was written by me this morning.   No chest pain.  Tele reviewed-sinus.  BP stable.   My exam: NAD, CV RRR LUngs clear Ext: no edema  Summary: Discharge home on meds above. STable CAD. Sinus on tele. Continue Eliquis.   CAD with angina: Cardiac cath 09/07/23 with stable CAD. Moderate LAD stenosis unchanged from last cath. No chest pain today.  Continue ASA, Toprol , Crestor  and Ranexa .      Atrial fib with RVR: Converted to sinus on amiodarone. Sinus this am.  Continue Toprol  and Eliquis    HFrEF/Acute on chronic systolic CHF: Volume status ok today. She received one dose of Lasix  yesterday and Farxiga was started. Known non-ischemic CM.  Continue Toprol  Continue Farxiga.   HLD: Continue statin   Deconditioning: Working with PT   Dispo: d/c home today. Keep f/u appt with Dr. Alroy Aspen next week on 09/13/23.   Antoinette Batman, MD, [facility] 09/09/2023 7:53 PM

## 2023-09-09 NOTE — Telephone Encounter (Signed)
   Transition of Care Follow-up Phone Call Request    Patient Name: Susan [name] Date of Birth: Jul 03, 1933 Date of Encounter: 09/09/2023  Primary Care Provider:  Aldo Hun, MD Primary Cardiologist:  Ahmad Alert, MD  [name] has been scheduled for a transition of care follow up appointment with a [facility] provider:  5/12 at 1:40pm with Dr. Alroy Aspen  Please reach out to [name] within 48 hours of discharge to confirm appointment and review transition of care protocol questionnaire. Anticipated discharge date: 09/09/23  Leala Prince, PA-C  09/09/2023, 9:38 AM

## 2023-09-09 NOTE — TOC Transition Note (Signed)
 Transition of Care (TOC) - Discharge Note Sherin Dingwall RN, BSN Transitions of Care Unit 4E- RN Case Manager See Treatment Team for direct phone #   Patient Details  Name: Susan [name] MRN: [id number] Date of Birth: [birth year]  Transition of Care [facility]) CM/SW Contact:  Rox Cope, RN Phone Number: 09/09/2023, 2:43 PM   Clinical Narrative:    Pt stable for transition home today, recs per PT for HHPT f/u, order requested.   CM spoke with pt at bedside- pt agreeable to trying [facility], choice offered for [facility] provider- pt states she would like to try [facility].   Pt declines needing any DME at this time- has cane at home if needed.  Voiced she has friend coming to transport home.   TOC to fill meds for discharge.   Call made to [facility]- referral accepted for HHPT- they will reach out to pt to schedule   Final next level of care: Home w Home Health Services Barriers to Discharge: Barriers Resolved   Patient Goals and CMS Choice Patient states their goals for this hospitalization and ongoing recovery are:: wants to remain independent CMS Medicare.gov Compare Post Acute Care list provided to:: Patient Choice offered to / list presented to : Patient      Discharge Placement                   Home w/ [facility]    Discharge Plan and Services Additional resources added to the After Visit Summary for     Discharge Planning Services: CM Consult Post Acute Care Choice: Home Health          DME Arranged: N/A DME Agency: NA       HH Arranged: PT HH Agency: [facility] Date [facility] Agency Contacted: 09/09/23 Time HH Agency Contacted: 1345 Representative spoke with at [facility] Agency: Randel Buss  Social Drivers of Health (SDOH) Interventions SDOH Screenings   Food Insecurity: No Food Insecurity (09/06/2023)  Housing: Low Risk  (09/06/2023)  Transportation Needs: No Transportation Needs (09/06/2023)  Utilities: Not At Risk (09/06/2023)  Social Connections: Moderately  Integrated (09/06/2023)  Tobacco Use: Low Risk  (09/05/2023)     Readmission Risk Interventions    09/09/2023    2:43 PM  Readmission Risk Prevention Plan  Post Dischage Appt Complete  Medication Screening Complete  Transportation Screening Complete

## 2023-09-09 NOTE — Progress Notes (Signed)
 Occupational Therapy Treatment Patient Details Name: Susan [name] MRN: [id number] DOB: [birth year] Today's Date: 09/09/2023   History of present illness 89 y.o. female with CAD s/p RCA PCI (2005), residual pLAD stenosis, HFrEF, LBBB, frequent PVCs, CAD s/p bilateral ICA (2019), pituitary adenoma, mild-mod AI, mod MR and HLD who was adm 5/5 for the evaluation of CP.  S/p R and L Heart Cath 5/6.   OT comments  Patient demonstrating good gains with OT treatment with supervision to perform self care standing at sink, LB dressing, and for toileting tasks. Patient performed mobility with HHA and no assistance for balance when standing at sink. Discharge recommendations continue to be appropriate. Acute OT to continue to follow to address established goals.       If plan is discharge home, recommend the following:  Assist for transportation;Assistance with cooking/housework   Equipment Recommendations  Tub/shower bench    Recommendations for Other Services      Precautions / Restrictions Precautions Precautions: Fall Restrictions Weight Bearing Restrictions Per Provider Order: Yes Other Position/Activity Restrictions: A-line removed and advised by Cardiac PA to limit pushing and pulling for 24 hours.  Weight restrictions for up to two weeks.       Mobility Bed Mobility Overal bed mobility: Modified Independent             General bed mobility comments: no physical assistance needed to get to EOB    Transfers Overall transfer level: Needs assistance Equipment used: 1 person hand held assist Transfers: Sit to/from Stand, Bed to chair/wheelchair/BSC Sit to Stand: Contact guard assist           General transfer comment: CGA to supervision with transfers and HHA     Balance Overall balance assessment: Needs assistance Sitting-balance support: Feet supported Sitting balance-Leahy Scale: Good     Standing balance support: Single extremity supported, No upper extremity  supported, During functional activity Standing balance-Leahy Scale: Fair Standing balance comment: able to stand at sink with no UE support during self care tasks                           ADL either performed or assessed with clinical judgement   ADL Overall ADL's : Needs assistance/impaired     Grooming: Wash/dry hands;Wash/dry face;Oral care;Brushing hair;Supervision/safety;Standing   Upper Body Bathing: Supervision/ safety;Standing           Lower Body Dressing: Supervision/safety;Sitting/lateral leans Lower Body Dressing Details (indicate cue type and reason): to change socks Toilet Transfer: Contact guard assist;Supervision/safety;Ambulation;Regular Teacher, adult education Details (indicate cue type and reason): for safety Toileting- Clothing Manipulation and Hygiene: Modified independent;Sitting/lateral lean Toileting - Clothing Manipulation Details (indicate cue type and reason): no assistance for toilet hygiene       General ADL Comments: supervision for safety    Extremity/Trunk Assessment Upper Extremity Assessment Upper Extremity Assessment: Generalized weakness            Vision       Perception     Praxis     Communication Communication Communication: No apparent difficulties   Cognition Arousal: Alert Behavior During Therapy: Lability Cognition: No apparent impairments                               Following commands: Intact        Cueing   Cueing Techniques: Verbal cues  Exercises      Shoulder Instructions  General Comments VSS    Pertinent Vitals/ Pain       Pain Assessment Pain Assessment: No/denies pain Pain Intervention(s): Monitored during session  Home Living                                          Prior Functioning/Environment              Frequency  Min 2X/week        Progress Toward Goals  OT Goals(current goals can now be found in the care plan  section)  Progress towards OT goals: Progressing toward goals  Acute Rehab OT Goals Patient Stated Goal: to go home OT Goal Formulation: With patient Time For Goal Achievement: 09/21/23 Potential to Achieve Goals: Good ADL Goals Pt Will Perform Grooming: Independently;standing Pt Will Perform Lower Body Dressing: Independently;sit to/from stand Pt Will Transfer to Toilet: Independently;ambulating;regular height toilet  Plan      Co-evaluation                 AM-PAC OT "6 Clicks" Daily Activity     Outcome Measure   Help from another person eating meals?: None Help from another person taking care of personal grooming?: A Little Help from another person toileting, which includes using toliet, bedpan, or urinal?: A Little Help from another person bathing (including washing, rinsing, drying)?: A Little Help from another person to put on and taking off regular upper body clothing?: A Little Help from another person to put on and taking off regular lower body clothing?: A Little 6 Click Score: 19    End of Session    OT Visit Diagnosis: Unsteadiness on feet (R26.81)   Activity Tolerance Patient tolerated treatment well   Patient Left in chair;with call bell/phone within reach;with chair alarm set   Nurse Communication Mobility status        Time: 1610-9604 OT Time Calculation (min): 21 min  Charges: OT General Charges $OT Visit: 1 Visit OT Treatments $Self Care/Home Management : 8-22 mins  Anitra Barn, OTA Acute Rehabilitation Services  Office [phone]   Jovita Nipper 09/09/2023, 10:00 AM

## 2023-09-09 NOTE — Care Management Important Message (Signed)
 Important Message  Patient Details  Name: Susan [name] MRN: [id number] Date of Birth: 05/24/1933   Important Message Given:  Yes - Medicare IM     Felix Host 09/09/2023, 11:16 AM

## 2023-09-12 ENCOUNTER — Encounter (HOSPITAL_COMMUNITY): Payer: Self-pay | Admitting: *Deleted

## 2023-09-12 ENCOUNTER — Inpatient Hospital Stay (HOSPITAL_COMMUNITY)
Admission: EM | Admit: 2023-09-12 | Discharge: 2023-09-15 | DRG: 292 | Disposition: A | Discharge status: Home Health | Attending: Internal Medicine | Admitting: Internal Medicine

## 2023-09-12 ENCOUNTER — Other Ambulatory Visit: Payer: Self-pay

## 2023-09-12 ENCOUNTER — Emergency Department (HOSPITAL_COMMUNITY)

## 2023-09-12 ENCOUNTER — Encounter: Payer: Self-pay | Admitting: Cardiovascular Disease

## 2023-09-12 ENCOUNTER — Encounter (HOSPITAL_COMMUNITY): Payer: Self-pay | Admitting: Internal Medicine

## 2023-09-12 DIAGNOSIS — Z7983 Long term (current) use of bisphosphonates: Secondary | ICD-10-CM | POA: Diagnosis not present

## 2023-09-12 DIAGNOSIS — D631 Anemia in chronic kidney disease: Secondary | ICD-10-CM | POA: Diagnosis present

## 2023-09-12 DIAGNOSIS — E785 Hyperlipidemia, unspecified: Secondary | ICD-10-CM | POA: Diagnosis present

## 2023-09-12 DIAGNOSIS — I2510 Atherosclerotic heart disease of native coronary artery without angina pectoris: Secondary | ICD-10-CM | POA: Diagnosis present

## 2023-09-12 DIAGNOSIS — Z79890 Hormone replacement therapy (postmenopausal): Secondary | ICD-10-CM

## 2023-09-12 DIAGNOSIS — D6869 Other thrombophilia: Secondary | ICD-10-CM | POA: Diagnosis present

## 2023-09-12 DIAGNOSIS — I447 Left bundle-branch block, unspecified: Secondary | ICD-10-CM | POA: Diagnosis present

## 2023-09-12 DIAGNOSIS — R0602 Shortness of breath: Secondary | ICD-10-CM | POA: Diagnosis not present

## 2023-09-12 DIAGNOSIS — R7401 Elevation of levels of liver transaminase levels: Secondary | ICD-10-CM

## 2023-09-12 DIAGNOSIS — M810 Age-related osteoporosis without current pathological fracture: Secondary | ICD-10-CM | POA: Diagnosis not present

## 2023-09-12 DIAGNOSIS — R031 Nonspecific low blood-pressure reading: Secondary | ICD-10-CM | POA: Diagnosis not present

## 2023-09-12 DIAGNOSIS — I340 Nonrheumatic mitral (valve) insufficiency: Secondary | ICD-10-CM | POA: Diagnosis not present

## 2023-09-12 DIAGNOSIS — Z5309 Procedure and treatment not carried out because of other contraindication: Secondary | ICD-10-CM | POA: Diagnosis present

## 2023-09-12 DIAGNOSIS — Z820 Family history of epilepsy and other diseases of the nervous system: Secondary | ICD-10-CM

## 2023-09-12 DIAGNOSIS — Z9841 Cataract extraction status, right eye: Secondary | ICD-10-CM

## 2023-09-12 DIAGNOSIS — J9811 Atelectasis: Secondary | ICD-10-CM | POA: Diagnosis not present

## 2023-09-12 DIAGNOSIS — Z7982 Long term (current) use of aspirin: Secondary | ICD-10-CM

## 2023-09-12 DIAGNOSIS — I493 Ventricular premature depolarization: Secondary | ICD-10-CM | POA: Diagnosis not present

## 2023-09-12 DIAGNOSIS — Z79899 Other long term (current) drug therapy: Secondary | ICD-10-CM

## 2023-09-12 DIAGNOSIS — N183 Chronic kidney disease, stage 3 unspecified: Secondary | ICD-10-CM | POA: Diagnosis not present

## 2023-09-12 DIAGNOSIS — Z7901 Long term (current) use of anticoagulants: Secondary | ICD-10-CM

## 2023-09-12 DIAGNOSIS — Z955 Presence of coronary angioplasty implant and graft: Secondary | ICD-10-CM

## 2023-09-12 DIAGNOSIS — D649 Anemia, unspecified: Secondary | ICD-10-CM | POA: Insufficient documentation

## 2023-09-12 DIAGNOSIS — J918 Pleural effusion in other conditions classified elsewhere: Secondary | ICD-10-CM | POA: Diagnosis present

## 2023-09-12 DIAGNOSIS — E039 Hypothyroidism, unspecified: Secondary | ICD-10-CM | POA: Diagnosis present

## 2023-09-12 DIAGNOSIS — I5023 Acute on chronic systolic (congestive) heart failure: Principal | ICD-10-CM

## 2023-09-12 DIAGNOSIS — Z66 Do not resuscitate: Secondary | ICD-10-CM | POA: Diagnosis not present

## 2023-09-12 DIAGNOSIS — I509 Heart failure, unspecified: Secondary | ICD-10-CM | POA: Insufficient documentation

## 2023-09-12 DIAGNOSIS — I517 Cardiomegaly: Secondary | ICD-10-CM | POA: Diagnosis not present

## 2023-09-12 DIAGNOSIS — Z7984 Long term (current) use of oral hypoglycemic drugs: Secondary | ICD-10-CM | POA: Diagnosis not present

## 2023-09-12 DIAGNOSIS — N1831 Chronic kidney disease, stage 3a: Secondary | ICD-10-CM | POA: Diagnosis not present

## 2023-09-12 DIAGNOSIS — R918 Other nonspecific abnormal finding of lung field: Secondary | ICD-10-CM | POA: Diagnosis not present

## 2023-09-12 DIAGNOSIS — I480 Paroxysmal atrial fibrillation: Secondary | ICD-10-CM | POA: Diagnosis not present

## 2023-09-12 DIAGNOSIS — I428 Other cardiomyopathies: Secondary | ICD-10-CM | POA: Diagnosis present

## 2023-09-12 DIAGNOSIS — R5381 Other malaise: Secondary | ICD-10-CM | POA: Diagnosis present

## 2023-09-12 DIAGNOSIS — Z8249 Family history of ischemic heart disease and other diseases of the circulatory system: Secondary | ICD-10-CM

## 2023-09-12 DIAGNOSIS — R0609 Other forms of dyspnea: Principal | ICD-10-CM

## 2023-09-12 DIAGNOSIS — D352 Benign neoplasm of pituitary gland: Secondary | ICD-10-CM | POA: Diagnosis present

## 2023-09-12 DIAGNOSIS — F32A Depression, unspecified: Secondary | ICD-10-CM | POA: Insufficient documentation

## 2023-09-12 DIAGNOSIS — Z961 Presence of intraocular lens: Secondary | ICD-10-CM | POA: Diagnosis present

## 2023-09-12 DIAGNOSIS — J90 Pleural effusion, not elsewhere classified: Secondary | ICD-10-CM | POA: Diagnosis not present

## 2023-09-12 DIAGNOSIS — Z9842 Cataract extraction status, left eye: Secondary | ICD-10-CM

## 2023-09-12 DIAGNOSIS — R7989 Other specified abnormal findings of blood chemistry: Secondary | ICD-10-CM | POA: Insufficient documentation

## 2023-09-12 DIAGNOSIS — R079 Chest pain, unspecified: Secondary | ICD-10-CM | POA: Diagnosis not present

## 2023-09-12 LAB — CBC WITH DIFFERENTIAL/PLATELET
Abs Immature Granulocytes: 0.05 10*3/uL (ref 0.00–0.07)
Basophils Absolute: 0.1 10*3/uL (ref 0.0–0.1)
Basophils Relative: 1 %
Eosinophils Absolute: 0.1 10*3/uL (ref 0.0–0.5)
Eosinophils Relative: 1 %
HCT: 34.7 % — ABNORMAL LOW (ref 36.0–46.0)
Hemoglobin: 11.2 g/dL — ABNORMAL LOW (ref 12.0–15.0)
Immature Granulocytes: 1 %
Lymphocytes Relative: 26 %
Lymphs Abs: 2.6 10*3/uL (ref 0.7–4.0)
MCH: 32 pg (ref 26.0–34.0)
MCHC: 32.3 g/dL (ref 30.0–36.0)
MCV: 99.1 fL (ref 80.0–100.0)
Monocytes Absolute: 0.8 10*3/uL (ref 0.1–1.0)
Monocytes Relative: 8 %
Neutro Abs: 6.3 10*3/uL (ref 1.7–7.7)
Neutrophils Relative %: 63 %
Platelets: 316 10*3/uL (ref 150–400)
RBC: 3.5 MIL/uL — ABNORMAL LOW (ref 3.87–5.11)
RDW: 14.9 % (ref 11.5–15.5)
WBC: 10 10*3/uL (ref 4.0–10.5)
nRBC: 0.3 % — ABNORMAL HIGH (ref 0.0–0.2)

## 2023-09-12 LAB — COMPREHENSIVE METABOLIC PANEL WITH GFR
ALT: 89 U/L — ABNORMAL HIGH (ref 0–44)
AST: 70 U/L — ABNORMAL HIGH (ref 15–41)
Albumin: 3.2 g/dL — ABNORMAL LOW (ref 3.5–5.0)
Alkaline Phosphatase: 54 U/L (ref 38–126)
Anion gap: 11 (ref 5–15)
BUN: 12 mg/dL (ref 8–23)
CO2: 24 mmol/L (ref 22–32)
Calcium: 8.9 mg/dL (ref 8.9–10.3)
Chloride: 101 mmol/L (ref 98–111)
Creatinine, Ser: 1.14 mg/dL — ABNORMAL HIGH (ref 0.44–1.00)
GFR, Estimated: 47 mL/min — ABNORMAL LOW (ref >60)
Glucose, Bld: 117 mg/dL — ABNORMAL HIGH (ref 70–99)
Potassium: 4 mmol/L (ref 3.5–5.1)
Sodium: 136 mmol/L (ref 135–145)
Total Bilirubin: 0.5 mg/dL (ref 0.0–1.2)
Total Protein: 6.4 g/dL — ABNORMAL LOW (ref 6.5–8.1)

## 2023-09-12 LAB — BRAIN NATRIURETIC PEPTIDE: B Natriuretic Peptide: 3510.6 pg/mL — ABNORMAL HIGH (ref 0.0–100.0)

## 2023-09-12 LAB — D-DIMER, QUANTITATIVE: D-Dimer, Quant: 1.71 ug{FEU}/mL — ABNORMAL HIGH (ref 0.00–0.50)

## 2023-09-12 MED ORDER — ACETAMINOPHEN 650 MG RE SUPP: 650.0000 mg | Freq: Four times a day (QID) | RECTAL | Status: DC | PRN

## 2023-09-12 MED ORDER — FUROSEMIDE 10 MG/ML IJ SOLN
20.0000 mg | Freq: Once | INTRAMUSCULAR | Status: AC
  Administered 2023-09-12: 20 mg via INTRAVENOUS
  Filled 2023-09-12: qty 2

## 2023-09-12 MED ORDER — NITROGLYCERIN 0.4 MG SL SUBL: 0.4000 mg | SUBLINGUAL_TABLET | SUBLINGUAL | Status: DC | PRN

## 2023-09-12 MED ORDER — FUROSEMIDE 10 MG/ML IJ SOLN
40.0000 mg | Freq: Two times a day (BID) | INTRAMUSCULAR | Status: DC
  Administered 2023-09-13 – 2023-09-14 (×2): 40 mg via INTRAVENOUS
  Filled 2023-09-12 (×3): qty 4

## 2023-09-12 MED ORDER — ROSUVASTATIN CALCIUM 20 MG PO TABS
40.0000 mg | ORAL_TABLET | Freq: Every day | ORAL | Status: DC
  Administered 2023-09-13 – 2023-09-15 (×3): 40 mg via ORAL
  Filled 2023-09-12 (×3): qty 2

## 2023-09-12 MED ORDER — ASPIRIN 81 MG PO CHEW
81.0000 mg | CHEWABLE_TABLET | Freq: Every day | ORAL | Status: DC
  Administered 2023-09-13 – 2023-09-15 (×3): 81 mg via ORAL
  Filled 2023-09-12 (×3): qty 1

## 2023-09-12 MED ORDER — VITAMIN B-12 1000 MCG PO TABS
2000.0000 ug | ORAL_TABLET | Freq: Every day | ORAL | Status: DC
  Administered 2023-09-13 – 2023-09-15 (×3): 2000 ug via ORAL
  Filled 2023-09-12 (×3): qty 2

## 2023-09-12 MED ORDER — ACETAMINOPHEN 325 MG PO TABS: 650.0000 mg | ORAL_TABLET | Freq: Four times a day (QID) | ORAL | Status: DC | PRN

## 2023-09-12 MED ORDER — RANOLAZINE ER 500 MG PO TB12
500.0000 mg | ORAL_TABLET | Freq: Two times a day (BID) | ORAL | Status: DC
  Administered 2023-09-13 – 2023-09-15 (×6): 500 mg via ORAL
  Filled 2023-09-12 (×6): qty 1

## 2023-09-12 MED ORDER — LEVOTHYROXINE SODIUM 75 MCG PO TABS: 37.5000 ug | ORAL_TABLET | ORAL | Status: DC

## 2023-09-12 MED ORDER — VENLAFAXINE HCL ER 150 MG PO CP24
150.0000 mg | ORAL_CAPSULE | Freq: Every day | ORAL | Status: DC
  Administered 2023-09-13 – 2023-09-15 (×3): 150 mg via ORAL
  Filled 2023-09-12: qty 2
  Filled 2023-09-12 (×2): qty 1

## 2023-09-12 MED ORDER — METOPROLOL SUCCINATE ER 25 MG PO TB24
12.5000 mg | ORAL_TABLET | Freq: Every day | ORAL | Status: DC
  Administered 2023-09-13 – 2023-09-15 (×2): 12.5 mg via ORAL
  Filled 2023-09-12 (×3): qty 1

## 2023-09-12 MED ORDER — DAPAGLIFLOZIN PROPANEDIOL 10 MG PO TABS
10.0000 mg | ORAL_TABLET | Freq: Every day | ORAL | Status: DC
  Administered 2023-09-13 – 2023-09-15 (×3): 10 mg via ORAL
  Filled 2023-09-12 (×3): qty 1

## 2023-09-12 MED ORDER — IOHEXOL 350 MG/ML SOLN
75.0000 mL | Freq: Once | INTRAVENOUS | Status: AC | PRN
  Administered 2023-09-12: 75 mL via INTRAVENOUS

## 2023-09-12 NOTE — ED Provider Notes (Signed)
 [place] [facility] AT [facility] Provider Note   CSN: [id number] Arrival date & time: 09/12/23  1313     History  Chief Complaint  Patient presents with   Shortness of Breath   Weakness    Susan [name] is a 89 y.o. female.  The patient is an 89 year old female with a past medical history of CAD status post stent placement, CHF with reduced EF, A-fib on Eliquis  and hypothyroidism presenting to the emergency department with shortness of breath.  Patient reports that she was admitted to the hospital last week for chest pain and had a heart catheterization that appeared stable.  She states that since she has been home she has had worsening shortness of breath.  She states that she feels short of breath even with just sitting up in bed or with talking.  She states that she did not move much in the hospital so does not remember if she was having any shortness of breath then.  She states that she has not really been sleeping but has not noticed that her shortness of breath is worse with lying flat.  She denies any lower extremity swelling.  She denies any fever or cough.  The history is provided by the patient.  Shortness of Breath Weakness Associated symptoms: shortness of breath        Home Medications Prior to Admission medications   Medication Sig Start Date End Date Taking? Authorizing Provider  acetaminophen  (TYLENOL ) 500 MG tablet Take 2 tablets (1,000 mg total) by mouth every 8 (eight) hours for 4 days, THEN 2 tablets (1,000 mg total) every 8 (eight) hours as needed for up to 15 days. 09/01/23 09/20/23  Gonfa, Taye T, MD  apixaban  (ELIQUIS ) 2.5 MG TABS tablet Take 1 tablet (2.5 mg total) by mouth 2 (two) times daily. 09/09/23   Leala Prince, PA-C  aspirin  81 MG chewable tablet Chew 1 tablet (81 mg total) by mouth daily. 09/09/23   Williams, Evan, PA-C  Calcium  Carbonate-Vitamin D  (CALCIUM  + D PO) Take 1 tablet by mouth 2 (two) times daily.    [provider]  cholecalciferol  (VITAMIN D3) 25 MCG (1000 UT) tablet Take 1,000 Units by mouth daily after breakfast.    [provider]  dapagliflozin  propanediol (FARXIGA ) 10 MG TABS tablet Take 1 tablet (10 mg total) by mouth daily. 09/09/23   Leala Prince, PA-C  denosumab  (PROLIA ) 60 MG/ML SOSY injection Inject 60 mg into the skin every 6 (six) months.    [provider]  levothyroxine  (SYNTHROID , LEVOTHROID) 75 MCG tablet Take 75 mcg by mouth daily before breakfast.  11/30/17   [provider]  metoprolol  succinate (TOPROL -XL) 25 MG 24 hr tablet Take 0.5 tablets (12.5 mg total) by mouth daily. 09/09/23   Williams, Evan, PA-C  multivitamin-lutein (OCUVITE-LUTEIN) CAPS capsule Take 1 capsule by mouth 2 (two) times a day.     [provider]  nitroGLYCERIN  (NITROSTAT ) 0.4 MG SL tablet Place 1 tablet (0.4 mg total) under the tongue every 5 (five) minutes as needed. Patient taking differently: Place 0.4 mg under the tongue every 5 (five) minutes as needed for chest pain. 06/22/11   Nahser, Lela Purple, MD  ranolazine  (RANEXA ) 500 MG 12 hr tablet Take 1 tablet (500 mg total) by mouth 2 (two) times daily. 09/01/23   Gonfa, Taye T, MD  rosuvastatin  (CRESTOR ) 40 MG tablet Take 1 tablet (40 mg total) by mouth daily. 01/18/23   Nahser, Lela Purple, MD  venlafaxine  XR (EFFEXOR -XR) 150 MG 24 hr capsule Take 1 capsule (150 mg total) by mouth daily with breakfast. 02/19/23   Nahser, Lela Purple, MD  vitamin B-12 (CYANOCOBALAMIN ) 1000 MCG tablet Take 2,000 mcg by mouth daily.     [provider]      Allergies    Patient has no known allergies.    Review of Systems   Review of Systems  Respiratory:  Positive for shortness of breath.   Neurological:  Positive for weakness.    Physical Exam Updated Vital Signs BP 115/77 (BP Location: Left Arm)   Pulse 75   Temp (!) 97.4 F (36.3 C) (Oral)   Resp 18   SpO2 96%  Physical Exam Vitals and nursing note reviewed.   Constitutional:      General: She is not in acute distress.    Appearance: She is well-developed.  HENT:     Head: Normocephalic and atraumatic.     Mouth/Throat:     Mouth: Mucous membranes are moist.     Pharynx: Oropharynx is clear.  Eyes:     Extraocular Movements: Extraocular movements intact.  Neck:     Vascular: No JVD.  Cardiovascular:     Rate and Rhythm: Normal rate and regular rhythm.     Heart sounds: Normal heart sounds.  Pulmonary:     Effort: No accessory muscle usage or respiratory distress.     Breath sounds: Examination of the right-lower field reveals rales. Examination of the left-lower field reveals rales. Rales present.     Comments: Conversational dyspnea Abdominal:     Palpations: Abdomen is soft.     Tenderness: There is no abdominal tenderness.  Musculoskeletal:        General: Normal range of motion.     Cervical back: Normal range of motion and neck supple.     Right lower leg: No edema.     Left lower leg: No edema.  Skin:    General: Skin is warm and dry.  Neurological:     General: No focal deficit present.     Mental Status: She is alert and oriented to person, place, and time.  Psychiatric:        Mood and Affect: Mood normal.        Behavior: Behavior normal.     ED Results / Procedures / Treatments   Labs (all labs ordered are listed, but only abnormal results are displayed) Labs Reviewed  COMPREHENSIVE METABOLIC PANEL WITH GFR - Abnormal; Notable for the following components:      Result Value   Glucose, Bld 117 (*)    Creatinine, Ser 1.14 (*)    Total Protein 6.4 (*)    Albumin  3.2 (*)    AST 70 (*)    ALT 89 (*)    GFR, Estimated 47 (*)    All other components within normal limits  CBC WITH DIFFERENTIAL/PLATELET - Abnormal; Notable for the following components:   RBC 3.50 (*)    Hemoglobin 11.2 (*)    HCT 34.7 (*)    nRBC 0.3 (*)    All other components within normal limits  BRAIN NATRIURETIC PEPTIDE - Abnormal; Notable  for the following components:   B Natriuretic Peptide 3,510.6 (*)    All other components within normal limits  D-DIMER, QUANTITATIVE - Abnormal; Notable for the following components:   D-Dimer, Quant 1.71 (*)    All other components within normal limits    EKG EKG Interpretation Date/Time:  Sunday  Sep 12 2023 13:25:29 EDT Ventricular Rate:  82 PR Interval:  161 QRS Duration:  141 QT Interval:  428 QTC Calculation: 500 R Axis:   -54  Text Interpretation: Sinus rhythm Probable left atrial enlargement Left bundle branch block Now in sinus rhythm compared to prior EKG Confirmed by Celesta Coke ([id number]) on 09/12/2023 1:33:18 PM  Radiology No results found.  Procedures Procedures    Medications Ordered in ED Medications  furosemide  (LASIX ) injection 20 mg (20 mg Intravenous Given 09/12/23 1522)    ED Course/ Medical Decision Making/ A&P Clinical Course as of 09/12/23 1526  Sun Sep 12, 2023  1450 Hgb at baseline, mild transaminitis. [VK]  1458 D-dimer elevated, will need CTPE. [VK]  1517 BNP significantly elevated, will start IV lasix . [VK]  1525 Patient signed out to Dr. Aldean Amass pending CTPE with plan for likely admission for diuresis. [VK]    Clinical Course User Index [VK] Kingsley, Emma Birchler K, DO                                 Medical Decision Making This patient presents to the ED with chief complaint(s) of shortness of breath with pertinent past medical history of CAD, CHF, hypothyroidism, A-fib on Eliquis  which further complicates the presenting complaint. The complaint involves an extensive differential diagnosis and also carries with it a high risk of complications and morbidity.    The differential diagnosis includes ACS, arrhythmia, anemia, pneumonia, pneumothorax, pulmonary edema, pleural effusion, viral syndrome, considering PE with recent hospitalization less likely as she has been compliant on her Eliquis   Additional history obtained: Additional history  obtained from EMS  Records reviewed previous admission documents  ED Course and Reassessment: On patient's arrival she is hemodynamically stable in no acute distress, does have conversational dyspnea and some crackles at the bases.  EKG on arrival had no acute ischemic changes.  Will have labs including BNP and D-dimer as well as chest x-ray performed and will be closely reassessed.  Independent labs interpretation:  The following labs were independently interpreted: BNP 3500, mild transaminitis, elevated d-dimer  Independent visualization of imaging: - I independently visualized the following imaging with scope of interpretation limited to determining acute life threatening conditions related to emergency care: CXR, which revealed mild pulm edema   Amount and/or Complexity of Data Reviewed Labs: ordered. Radiology: ordered.  Risk Prescription drug management.          Final Clinical Impression(s) / ED Diagnoses Final diagnoses:  Dyspnea on exertion  Transaminitis    Rx / DC Orders ED Discharge Orders     None         Kingsley, Elanora Quin K, DO 09/12/23 1526

## 2023-09-12 NOTE — Progress Notes (Addendum)
 PHARMACY - ANTICOAGULATION CONSULT NOTE  Pharmacy Consult for heparin  Indication: atrial fibrillation  No Known Allergies  Patient Measurements: Height: 5\' 3"  (160 cm) Weight: 60.1 kg (132 lb 7.9 oz) IBW/kg (Calculated) : 52.4 HEPARIN  DW (KG): 60.1  Vital Signs: Temp: 98.2 F (36.8 C) (05/11 2103) Temp Source: Oral (05/11 2103) BP: 104/65 (05/11 2300) Pulse Rate: 88 (05/11 2317)  Labs: Recent Labs    09/12/23 1321  HGB 11.2*  HCT 34.7*  PLT 316  CREATININE 1.14*    Estimated Creatinine Clearance: 28.8 mL/min (A) (by C-G formula based on SCr of 1.14 mg/dL (H)).   Medical History: Past Medical History:  Diagnosis Date   Chronic HFrEF (heart failure with reduced ejection fraction) (HCC)    Coronary artery disease    PTCA/stenting of RCA 2005, moderate residual 70% stenosis of LAD stable by last cath 2021   Frequent PVCs    Hyperlipidemia    Hypothyroidism    LBBB (left bundle branch block)    Mild carotid artery disease (HCC)    Osteoporosis    Syncope     Medications:  -Eliquis  2.5mg  PO BID (LD 5/11 @10 :30)  Assessment: 68 yoF presented to ED with SOB/weakness. PMH includes atrial fibrillation (on eliquis ), CAD including stent to RCA in 2005 and moderate-severe LAD stenosis, and HFrEF. Pharmacy consulted to dose heparin  for atrial fibrillation.  -Hgb 11s, plts 316 -CTA: no PE, large bilateral pleural effusions  Goal of Therapy:  Heparin  level 0.3-0.7 units/ml aPTT 66-102 seconds Monitor platelets by anticoagulation protocol: Yes   Plan:  -No bolus given recent eliquis  use -Start heparin  infusion at 900 units/hr -aPTT in 8 hours  -Monitor with aPTTs until correlates with heparin  level -CBC daily  Young Hensen, PharmD, BCPS Clinical Pharmacist 09/13/2023 12:02 AM

## 2023-09-12 NOTE — ED Provider Notes (Signed)
 Care transferred to me.  CT confirms what looks like CHF.  No PE.  Will give an additional dose of Lasix  as she is only had a little bit of extra urine output with the 20 mg.  Discussed with Dr. [facility] Lavender who will admit.   Jerilynn Montenegro, MD 09/12/23 [phone]

## 2023-09-12 NOTE — ED Triage Notes (Addendum)
 Patient presents to ed via [facility] states she was discharged from hosptial 5/8 after being in the hospital for 4 days . States she has cath and her stents were good. States she gets so sob with any exertion c/o feelin very weak and decreased appetite. Lungs clear. Speaking in choppy sentences.

## 2023-09-12 NOTE — ED Notes (Signed)
 Patient transported to CT

## 2023-09-12 NOTE — Progress Notes (Signed)
 This encounter was created in error - please disregard.

## 2023-09-12 NOTE — ED Notes (Signed)
 NT assisting pt to bedside commode called out for RN assistance. Pt was able to ambulate with Jerryl Morin NT as standby assist. As pt finished using the bedside commode and stood, pt suddenly began to hyperventilate and eyes rolled to back of the head, NT assisted pt in bed. Pt tachypnic, did not become hypoxic. Pt responded immediately when called by name, oriented x4. Pt stated, "This happens to me all the time. I suddenly get short of breath and I feel like I'm going to die. I just get so scared." Pt reassured by this RN, repositioned in bed, HOB lowered and lights dimmed per request. Call light within reach. Door left open. NADN.

## 2023-09-12 NOTE — ED Notes (Signed)
Pharmacy tech at the bedside 

## 2023-09-12 NOTE — ED Notes (Signed)
Dr. Kakrakandy at bedside. 

## 2023-09-12 NOTE — H&P (Incomplete)
 History and Physical    Susan [name] WUJ:[id number] DOB: [birth year] DOA: 09/12/2023  Patient coming from: Home.  Chief Complaint: Shortness of breath.  HPI: Susan [name] is a 89 y.o. female with history of CAD status post RCA PCI in 2005, residual LAD stenosis, chronic HFrEF with chronic LBBB frequent PVCs, pituitary macroadenoma, chronic kidney disease stage III, chronic anemia who was recently admitted to the hospital on 09/06/2023 with chest pain underwent cardiac cath which showed stable CAD and medically managed during the admission patient also was found to have new onset A-fib was initially managed on amiodarone  and transitioned to Toprol -XL and Eliquis  also was found to have acute on chronic HFrEF presents to the ER with complaints of shortness of breath.  Patient states since her discharge on 09/09/2023 she has progressively got short of breath with minimal exertion.  Does not have any chest pain productive cough fever or chills.  Patient states any kind of exertion makes her short of breath.  ED Course: In the ER labs show BNP of 3500 and CT angiogram of the chest which was done because of elevated D-dimer showed bilateral large pleural effusion.  Patient started on Lasix  40 mg IV and admitted for acute on chronic HFrEF.  EKG shows sinus rhythm with LBBB.  Troponins are pending.  Creatinine is 1.1 and hemoglobin 11.2.  Review of Systems: As per HPI, rest all negative.   Past Medical History:  Diagnosis Date   Chronic HFrEF (heart failure with reduced ejection fraction) (HCC)    Coronary artery disease    PTCA/stenting of RCA 2005, moderate residual 70% stenosis of LAD stable by last cath 2021   Frequent PVCs    Hyperlipidemia    Hypothyroidism    LBBB (left bundle branch block)    Mild carotid artery disease (HCC)    Osteoporosis    Syncope     Past Surgical History:  Procedure Laterality Date   APPENDECTOMY  1970s?   "ruptured"    BACK SURGERY     CATARACT EXTRACTION  W/ INTRAOCULAR LENS  IMPLANT, BILATERAL Bilateral    CORONARY ANGIOPLASTY WITH STENT PLACEMENT  ~ 2009   "2 stents"   FRACTURE SURGERY     IR VERTEBROPLASTY CERV/THOR BX INC UNI/BIL INC/INJECT/IMAGING  02/07/2018   KNEE ARTHROSCOPY Right     torn meniscus   LEFT HEART CATH AND CORONARY ANGIOGRAPHY N/A 03/21/2020   Procedure: LEFT HEART CATH AND CORONARY ANGIOGRAPHY;  Surgeon: Avanell Leigh, MD;  Location: [facility];  Service: Cardiovascular;  Laterality: N/A;   LEFT HEART CATH AND CORONARY ANGIOGRAPHY N/A 09/07/2023   Procedure: LEFT HEART CATH AND CORONARY ANGIOGRAPHY;  Surgeon: Arnoldo Lapping, MD;  Location: [facility] INVASIVE CV LAB;  Service: Cardiovascular;  Laterality: N/A;   LUMBAR LAMINECTOMY  1978   OVARIAN CYST SURGERY  1970s?   "ruptured"    RIGHT/LEFT HEART CATH AND CORONARY ANGIOGRAPHY N/A 10/08/2017   Procedure: RIGHT/LEFT HEART CATH AND CORONARY ANGIOGRAPHY;  Surgeon: Sammy Crisp, MD;  Location: [facility] IN[facility] CV [facility];  Service: Cardiovascular;  Laterality: N/A;   WRIST FRACTURE SURGERY Bilateral    "2 surgeries for 2 breaks on left side; 1 surgery for 1 break on right wrist"     reports that she has never smoked. She has never used smokeless tobacco. She reports that she does not currently use alcohol  after a past usage of about 1.0 standard drink of alcohol  per week. She reports that she does not use drugs.  No Known Allergies  Family History  Problem Relation Age of Onset   Alzheimer's disease Mother    Heart attack Father    Aneurysm Sister     Prior to Admission medications   Medication Sig Start Date End Date Taking? Authorizing Provider  apixaban  (ELIQUIS ) 2.5 MG TABS tablet Take 1 tablet (2.5 mg total) by mouth 2 (two) times daily. 09/09/23  Yes Leala Prince, PA-C  aspirin  81 MG chewable tablet Chew 1 tablet (81 mg total) by mouth daily. 09/09/23  Yes Leala Prince, PA-C  Calcium  Carbonate-Vitamin D  (CALCIUM  + D PO) Take 1 tablet by mouth 2 (two) times  daily.   Yes [provider]  cholecalciferol  (VITAMIN D3) 25 MCG (1000 UT) tablet Take 1,000 Units by mouth daily after breakfast.   Yes [provider]  dapagliflozin  propanediol (FARXIGA ) 10 MG TABS tablet Take 1 tablet (10 mg total) by mouth daily. 09/09/23  Yes Williams, Evan, PA-C  denosumab  (PROLIA ) 60 MG/ML SOSY injection Inject 60 mg into the skin every 6 (six) months.   Yes [provider]  levothyroxine  (SYNTHROID , LEVOTHROID) 75 MCG tablet Take 37.5-75 mcg by mouth See admin instructions. Take 1 tablet by mouth daily, except on Sunday, take 1/2 tablet 11/30/17  Yes [provider]  metoprolol  succinate (TOPROL -XL) 25 MG 24 hr tablet Take 0.5 tablets (12.5 mg total) by mouth daily. 09/09/23  Yes Williams, Evan, PA-C  multivitamin-lutein (OCUVITE-LUTEIN) CAPS capsule Take 1 capsule by mouth 2 (two) times a day.    Yes [provider]  nitroGLYCERIN  (NITROSTAT ) 0.4 MG SL tablet Place 1 tablet (0.4 mg total) under the tongue every 5 (five) minutes as needed. Patient taking differently: Place 0.4 mg under the tongue every 5 (five) minutes as needed for chest pain. 06/22/11  Yes Nahser, Lela Purple, MD  ranolazine  (RANEXA ) 500 MG 12 hr tablet Take 1 tablet (500 mg total) by mouth 2 (two) times daily. 09/01/23  Yes Gonfa, Taye T, MD  rosuvastatin  (CRESTOR ) 40 MG tablet Take 1 tablet (40 mg total) by mouth daily. 01/18/23  Yes Nahser, Lela Purple, MD  venlafaxine  XR (EFFEXOR -XR) 150 MG 24 hr capsule Take 1 capsule (150 mg total) by mouth daily with breakfast. 02/19/23  Yes Nahser, Lela Purple, MD  vitamin B-12 (CYANOCOBALAMIN ) 1000 MCG tablet Take 2,000 mcg by mouth daily.    Yes [provider]  acetaminophen  (TYLENOL ) 500 MG tablet Take 2 tablets (1,000 mg total) by mouth every 8 (eight) hours for 4 days, THEN 2 tablets (1,000 mg total) every 8 (eight) hours as needed for up to 15 days. Patient not taking: Reported on 09/12/2023 09/01/23 09/20/23  Theadore Finger,  MD    Physical Exam: Constitutional: Moderately built and nourished. Vitals:   09/12/23 1700 09/12/23 1715 09/12/23 1945 09/12/23 2103  BP: 118/71 116/77 119/75 119/78  Pulse: 78 76 74 75  Resp:  (!) 22 20 18   Temp:  98.3 F (36.8 C)  98.2 F (36.8 C)  TempSrc:  Oral  Oral  SpO2: 96% 96% 100% 98%   Eyes: Anicteric no pallor. ENMT: No discharge from the ears eyes nose or mouth. Neck: No mass felt.  No neck rigidity. Respiratory: No rhonchi or crepitations. Cardiovascular: S1-S2 heard. Abdomen: Soft nontender bowel sound present. Musculoskeletal: No edema. Skin: No rash. Neurologic: Alert awake oriented to time place and person.  Moves all extremities. Psychiatric: Appears normal.  Normal affect.   Labs on Admission: I have personally reviewed following labs and imaging  studies  CBC: Recent Labs  Lab 09/07/23 0331 09/08/23 0306 09/09/23 0415 09/12/23 1321  WBC 8.1 6.9 6.8 10.0  NEUTROABS  --   --   --  6.3  HGB 10.6* 11.0* 11.0* 11.2*  HCT 32.2* 33.2* 33.7* 34.7*  MCV 97.9 96.2 97.7 99.1  PLT 199 215 247 316   Basic Metabolic Panel: Recent Labs  Lab 09/07/23 0331 09/07/23 1639 09/12/23 1321  NA 138 136 136  K 3.6 3.5 4.0  CL 107 104 101  CO2 21* 20* 24  GLUCOSE 94 108* 117*  BUN 14 16 12   CREATININE 1.03* 1.01* 1.14*  CALCIUM  7.9* 8.0* 8.9  MG  --  1.9  --    GFR: Estimated Creatinine Clearance: 28.8 mL/min (A) (by C-G formula based on SCr of 1.14 mg/dL (H)). Liver Function Tests: Recent Labs  Lab 09/12/23 1321  AST 70*  ALT 89*  ALKPHOS 54  BILITOT 0.5  PROT 6.4*  ALBUMIN  3.2*   No results for input(s): "LIPASE", "AMYLASE" in the last 168 hours. No results for input(s): "AMMONIA" in the last 168 hours. Coagulation Profile: No results for input(s): "INR", "PROTIME" in the last 168 hours. Cardiac Enzymes: No results for input(s): "CKTOTAL", "CKMB", "CKMBINDEX", "TROPONINI" in the last 168 hours. BNP (last 3 results) No results for input(s):  "PROBNP" in the last 8760 hours. HbA1C: No results for input(s): "HGBA1C" in the last 72 hours. CBG: Recent Labs  Lab 09/06/23 1342 09/08/23 2101 09/09/23 0604 09/09/23 1237  GLUCAP 187* 101* 103* 110*   Lipid Profile: No results for input(s): "CHOL", "HDL", "LDLCALC", "TRIG", "CHOLHDL", "LDLDIRECT" in the last 72 hours. Thyroid  Function Tests: No results for input(s): "TSH", "T4TOTAL", "FREET4", "T3FREE", "THYROIDAB" in the last 72 hours. Anemia Panel: No results for input(s): "VITAMINB12", "FOLATE", "FERRITIN", "TIBC", "IRON", "RETICCTPCT" in the last 72 hours. Urine analysis:    Component Value Date/Time   COLORURINE STRAW (A) 03/06/2023 1420   APPEARANCEUR CLEAR 03/06/2023 1420   LABSPEC 1.018 03/06/2023 1420   PHURINE 7.0 03/06/2023 1420   GLUCOSEU NEGATIVE 03/06/2023 1420   HGBUR NEGATIVE 03/06/2023 1420   BILIRUBINUR NEGATIVE 03/06/2023 1420   KETONESUR 5 (A) 03/06/2023 1420   PROTEINUR NEGATIVE 03/06/2023 1420   UROBILINOGEN 0.2 07/13/2011 2036   NITRITE NEGATIVE 03/06/2023 1420   LEUKOCYTESUR NEGATIVE 03/06/2023 1420   Sepsis Labs: @LABRCNTIP (procalcitonin:4,lacticidven:4) ) Recent Results (from the past 240 hours)  MRSA Next Gen by PCR, Nasal     Status: None   Collection Time: 09/06/23  4:20 PM   Specimen: Nasal Mucosa; Nasal Swab  Result Value Ref Range Status   MRSA by PCR Next Gen NOT DETECTED NOT DETECTED Final    Comment: (NOTE) The GeneXpert MRSA Assay (FDA approved for NASAL specimens only), is one component of a comprehensive MRSA colonization surveillance program. It is not intended to diagnose MRSA infection nor to guide or monitor treatment for MRSA infections. Test performance is not FDA approved in patients less than 65 years old. Performed at [facility], [address] [place], [place] [address]      Radiological Exams on Admission: CT Angio Chest PE W/Cm &/Or Wo Cm Result Date: 09/12/2023 CLINICAL DATA:  Suspected pulmonary  embolism. EXAM: CT ANGIOGRAPHY CHEST WITH CONTRAST TECHNIQUE: Multidetector CT imaging of the chest was performed using the standard protocol during bolus administration of intravenous contrast. Multiplanar CT image reconstructions and MIPs were obtained to evaluate the vascular anatomy. RADIATION DOSE REDUCTION: This exam was performed according to the  departmental dose-optimization program which includes automated exposure control, adjustment of the mA and/or kV according to patient size and/or use of iterative reconstruction technique. CONTRAST:  75mL OMNIPAQUE  IOHEXOL  350 MG/ML SOLN COMPARISON:  August 30, 2023 FINDINGS: Cardiovascular: Marked severity calcification of the aortic arch is seen without evidence of aortic aneurysm. The bilateral lower lobe subsegmental pulmonary arteries are limited in evaluation secondary to suboptimal opacification with intravenous contrast and overlying artifact. No evidence of pulmonary embolism. The main pulmonary artery measures 3.7 cm in diameter. Normal heart size with marked severity coronary artery calcification. No pericardial effusion. Mediastinum/Nodes: No enlarged mediastinal, hilar, or axillary lymph nodes. Thyroid  gland, trachea, and esophagus demonstrate no significant findings. Lungs/Pleura: Mild to moderate severity posterior bilateral upper lobe and bilateral lower lobe dependent atelectasis is seen. A predominantly stable 3 mm anterior right middle lobe pulmonary nodule is noted (axial CT image 87, CT series 6). Moderate to large bilateral pleural effusions are noted. No pneumothorax is identified. Upper Abdomen: A 9 mm hepatic cyst versus hemangioma is suspected within the anterior aspect of the left lobe of the liver. Musculoskeletal: A chronic compression fracture deformity and subsequent vertebroplasty are seen at the level of T6. Review of the MIP images confirms the above findings. IMPRESSION: 1. No evidence of pulmonary embolism. 2. Moderate to large  bilateral pleural effusions with mild to moderate severity bilateral upper lobe and bilateral lower lobe dependent atelectasis. 3. Marked severity coronary artery calcification. 4. Chronic compression fracture deformity and subsequent vertebroplasty at the level of T6. 5. Aortic atherosclerosis. Electronically Signed   By: Virgle Grime M.D.   On: 09/12/2023 19:16   DG Chest Port 1 View Result Date: 09/12/2023 CLINICAL DATA:  Shortness of breath. EXAM: PORTABLE CHEST 1 VIEW COMPARISON:  09/06/2023 FINDINGS: Stable cardiomegaly. Unchanged mediastinal contours. Aortic atherosclerosis. Improvement in pulmonary edema from prior exam, however mild residual. There are small bilateral pleural effusions. Improving retrocardiac opacity with mild residual. IMPRESSION: 1. Unchanged cardiomegaly. Improvement in pulmonary edema from prior exam, however mild residual. 2. Small bilateral pleural effusions. 3. Improving retrocardiac opacity with mild residual. Electronically Signed   By: Chadwick Colonel M.D.   On: 09/12/2023 15:49    EKG: Independently reviewed.  [place] rhythm LBBB.  Assessment/Plan Principal Problem:   Acute on chronic HFrEF (heart failure with reduced ejection fraction) (HCC) Active Problems:   Hypothyroidism   Pituitary adenoma (HCC)   Coronary artery disease   Anemia   Bilateral pleural effusion   Elevated LFTs   PAF (paroxysmal atrial fibrillation) (HCC)   Depression    Acute on chronic HFrEF last EF measured was 25 to 30% on 09/09/2023 today CT scan shows large bilateral pleural effusion.  Will keep patient on Lasix  40 mg IV every 12 patient likely will need thoracentesis.  Will hold Eliquis  and in anticipation of possible thoracentesis will keep patient on heparin  infusion.  Patient is on dapagliflozin  which we will continue.  Cardiology was planning to start patient on ACE/ARB as outpatient.  Will consult cardiology.  Closely follow intake output metabolic panel daily weights. CAD  status post recent cardiac cath showed stable CAD.  Denies any chest pain at this time we will continue with Toprol -XL heparin  infusion statins and aspirin .  Troponins are pending. Paroxysmal atrial fibrillation on Toprol -XL and heparin  infusion see #1. Chronic kidney disease stage III creatinine at around baseline. Anemia likely from renal disease follow anemia panel.  Follow CBC. Hypothyroidism on Synthroid  check TSH. Depression on Effexor . Elevated LFTs likely from  CHF.  Abdomen appears benign.  Closely monitor.  Since patient has acute on chronic HFrEF will need IV diuresis and more than 2 midnight stay.   DVT prophylaxis: Heparin  infusion. Code Status: Full code. Family Communication: Discussed with patient. Disposition Plan: Cardiac telemetry. Consults called: Cardiology. Admission status: Inpatient.

## 2023-09-13 ENCOUNTER — Ambulatory Visit: Admitting: Cardiovascular Disease

## 2023-09-13 DIAGNOSIS — N183 Chronic kidney disease, stage 3 unspecified: Secondary | ICD-10-CM | POA: Insufficient documentation

## 2023-09-13 DIAGNOSIS — I5023 Acute on chronic systolic (congestive) heart failure: Secondary | ICD-10-CM | POA: Diagnosis not present

## 2023-09-13 LAB — BASIC METABOLIC PANEL WITH GFR
Anion gap: 12 (ref 5–15)
BUN: 10 mg/dL (ref 8–23)
CO2: 23 mmol/L (ref 22–32)
Calcium: 8.8 mg/dL — ABNORMAL LOW (ref 8.9–10.3)
Chloride: 103 mmol/L (ref 98–111)
Creatinine, Ser: 1.12 mg/dL — ABNORMAL HIGH (ref 0.44–1.00)
GFR, Estimated: 48 mL/min — ABNORMAL LOW (ref >60)
Glucose, Bld: 91 mg/dL (ref 70–99)
Potassium: 3.7 mmol/L (ref 3.5–5.1)
Sodium: 138 mmol/L (ref 135–145)

## 2023-09-13 LAB — HEPARIN LEVEL (UNFRACTIONATED): Heparin Unfractionated: >1.1 [IU]/mL — ABNORMAL HIGH (ref 0.30–0.70)

## 2023-09-13 LAB — IRON AND TIBC
Iron: 41 ug/dL (ref 28–170)
Saturation Ratios: 11 % (ref 10.4–31.8)
TIBC: 368 ug/dL (ref 250–450)
UIBC: 327 ug/dL

## 2023-09-13 LAB — TROPONIN I (HIGH SENSITIVITY)
Troponin I (High Sensitivity): 36 ng/L — ABNORMAL HIGH (ref <18)
Troponin I (High Sensitivity): 40 ng/L — ABNORMAL HIGH (ref <18)

## 2023-09-13 LAB — CBC
HCT: 35.3 % — ABNORMAL LOW (ref 36.0–46.0)
Hemoglobin: 11.7 g/dL — ABNORMAL LOW (ref 12.0–15.0)
MCH: 32.3 pg (ref 26.0–34.0)
MCHC: 33.1 g/dL (ref 30.0–36.0)
MCV: 97.5 fL (ref 80.0–100.0)
Platelets: 318 10*3/uL (ref 150–400)
RBC: 3.62 MIL/uL — ABNORMAL LOW (ref 3.87–5.11)
RDW: 15 % (ref 11.5–15.5)
WBC: 9.3 10*3/uL (ref 4.0–10.5)
nRBC: 0 % (ref 0.0–0.2)

## 2023-09-13 LAB — APTT: aPTT: 65 s — ABNORMAL HIGH (ref 24–36)

## 2023-09-13 LAB — RETICULOCYTES
Immature Retic Fract: 43.3 % — ABNORMAL HIGH (ref 2.3–15.9)
RBC.: 3.61 MIL/uL — ABNORMAL LOW (ref 3.87–5.11)
Retic Count, Absolute: 116.6 10*3/uL (ref 19.0–186.0)
Retic Ct Pct: 3.2 % — ABNORMAL HIGH (ref 0.4–3.1)

## 2023-09-13 LAB — FOLATE: Folate: 16.3 ng/mL (ref >5.9)

## 2023-09-13 LAB — TSH: TSH: 1.737 u[IU]/mL (ref 0.350–4.500)

## 2023-09-13 LAB — FERRITIN: Ferritin: 127 ng/mL (ref 11–307)

## 2023-09-13 LAB — VITAMIN B12: Vitamin B-12: 2146 pg/mL — ABNORMAL HIGH (ref 180–914)

## 2023-09-13 MED ORDER — LEVOTHYROXINE SODIUM 75 MCG PO TABS: 37.5000 ug | ORAL_TABLET | ORAL | Status: DC

## 2023-09-13 MED ORDER — LEVOTHYROXINE SODIUM 75 MCG PO TABS
75.0000 ug | ORAL_TABLET | ORAL | Status: DC
  Administered 2023-09-13 – 2023-09-15 (×3): 75 ug via ORAL
  Filled 2023-09-13 (×3): qty 1

## 2023-09-13 MED ORDER — HEPARIN (PORCINE) 25000 UT/250ML-% IV SOLN
1000.0000 [IU]/h | INTRAVENOUS | Status: AC
  Administered 2023-09-13: 900 [IU]/h via INTRAVENOUS
  Filled 2023-09-13: qty 250

## 2023-09-13 NOTE — Consult Note (Addendum)
 Cardiology Consultation   Patient ID: [name] MRN: [id number]; DOB: [birth year]  Admit date: 09/12/2023 Date of Consult: 09/13/2023  PCP:  Aldo Hun, MD   [facility] Providers Cardiologist:  Ahmad Alert, MD        Patient Profile:   Susan [name] is a 89 y.o. female with CAD s/p RCA PCI (2005), residual pLAD stenosis, HFrEF, LBBB, frequent PVCs, CAD s/p bilateral ICA (2019), pituitary adenoma, mild-mod AI, mod MR and HLD. She is being seen 09/13/2023 for the evaluation of acute on chronic CHF at the request of Dr. Drexel Gentles.  History of Present Illness:   Susan [name] was recently discharged by [facility] after an admission with chest pain. Patient had onset of chest pain on the morning of 5/4, called EMS. LHC on 5/6 with stable CAD, moderate LAD stenosis unchanged from previous LHC. (Of note, patient also admitted in April for the same symptoms). During her recent admission, BNP 1134.1 and patient developed symptoms consistent with flash pulmonary edema. She received IV lasix  and had rapid improvement. On LHC, LVEDP was and patient received additional IV lasix . She was started on farxiga  10mg . Patient with stable volume status on day of discharge. During this admission, patient also with paroxysmal afib, was initially treated with Amiodarone  and converted to NSR (maintained through discharge). She was transitioned to Toprol  XL and Eliquis .   Patient presented to the ED on 5/11, reporting progressive dyspnea since discharging on 5/8. Dyspnea primarily exertional. Patient without chest pain or palpitations. ED workup found BNP 3510.6, troponin 36->40, creatinine 1.14. D-dimer checked and found elevated to 1.71 prompting chest CTA. This found no PE but did note moderate to large bilateral pleural effusions. Patient started on IV lasix  40mg  BID. Appears that there are plans to request IR consult for thoracentesis.    Past Medical History:  Diagnosis Date   Chronic  HFrEF (heart failure with reduced ejection fraction) (HCC)    Coronary artery disease    PTCA/stenting of RCA 2005, moderate residual 70% stenosis of LAD stable by last cath 2021   Frequent PVCs    Hyperlipidemia    Hypothyroidism    LBBB (left bundle branch block)    Mild carotid artery disease (HCC)    Osteoporosis    Syncope     Past Surgical History:  Procedure Laterality Date   APPENDECTOMY  1970s?   "ruptured"    BACK SURGERY     CATARACT EXTRACTION W/ INTRAOCULAR LENS  IMPLANT, BILATERAL Bilateral    CORONARY ANGIOPLASTY WITH STENT PLACEMENT  ~ 2009   "2 stents"   FRACTURE SURGERY     IR VERTEBROPLASTY CERV/THOR BX INC UNI/BIL INC/INJECT/IMAGING  02/07/2018   KNEE ARTHROSCOPY Right     torn meniscus   LEFT HEART CATH AND CORONARY ANGIOGRAPHY N/A 03/21/2020   Procedure: LEFT HEART CATH AND CORONARY ANGIOGRAPHY;  Surgeon: Avanell Leigh, MD;  Location: [facility] INVASIVE CV LAB;  Service: Cardiovascular;  Laterality: N/A;   LEFT HEART CATH AND CORONARY ANGIOGRAPHY N/A 09/07/2023   Procedure: LEFT HEART CATH AND CORONARY ANGIOGRAPHY;  Surgeon: Arnoldo Lapping, MD;  Location: [facility] INVASIVE CV LAB;  Service: Cardiovascular;  Laterality: N/A;   LUMBAR LAMINECTOMY  1978   OVARIAN CYST SURGERY  1970s?   "ruptured"    RIGHT/LEFT HEART CATH AND CORONARY ANGIOGRAPHY N/A 10/08/2017   Procedure: RIGHT/LEFT HEART CATH AND CORONARY ANGIOGRAPHY;  Surgeon: Sammy Crisp, MD;  Location: [facility] INVASIVE CV LAB;  Service: Cardiovascular;  Laterality: N/A;  WRIST FRACTURE SURGERY Bilateral    "2 surgeries for 2 breaks on left side; 1 surgery for 1 break on right wrist"       Inpatient Medications: Scheduled Meds:  aspirin   81 mg Oral Daily   cyanocobalamin   2,000 mcg Oral Daily   dapagliflozin  propanediol  10 mg Oral Daily   furosemide   40 mg Intravenous Q12H   levothyroxine   75 mcg Oral Once per day on Monday Tuesday Wednesday Thursday Friday Saturday   And   [START ON 09/19/2023] levothyroxine    37.5 mcg Oral Every Sunday   metoprolol  succinate  12.5 mg Oral Daily   ranolazine   500 mg Oral BID   rosuvastatin   40 mg Oral Daily   venlafaxine  XR  150 mg Oral Q breakfast   Continuous Infusions:  heparin  1,000 Units/hr (09/13/23 1349)   PRN Meds: acetaminophen  **OR** acetaminophen , nitroGLYCERIN   Allergies:   No Known Allergies  Social History:   Social History   Socioeconomic History   Marital status: Widowed    Spouse name: Not on file   Number of children: Not on file   Years of education: Not on file   Highest education level: Not on file  Occupational History   Not on file  Tobacco Use   Smoking status: Never   Smokeless tobacco: Never  Vaping Use   Vaping status: Never Used  Substance and Sexual Activity   Alcohol  use: Not Currently    Alcohol /week: 1.0 standard drink of alcohol     Types: 1 Glasses of wine per week   Drug use: Never   Sexual activity: Not on file  Other Topics Concern   Not on file  Social History Narrative   Not on file   Social Drivers of Health   Financial Resource Strain: Not on file  Food Insecurity: No Food Insecurity (09/06/2023)   Hunger Vital Sign    Worried About Running Out of Food in the Last Year: Never true    Ran Out of Food in the Last Year: Never true  Transportation Needs: No Transportation Needs (09/06/2023)   PRAPARE - Administrator, Civil Service (Medical): No    Lack of Transportation (Non-Medical): No  Physical Activity: Not on file  Stress: Not on file  Social Connections: Moderately Integrated (09/06/2023)   Social Connection and Isolation Panel [NHANES]    Frequency of Communication with Friends and Family: Three times a week    Frequency of Social Gatherings with Friends and Family: Three times a week    Attends Religious Services: 1 to 4 times per year    Active Member of Clubs or Organizations: Yes    Attends Banker Meetings: 1 to 4 times per year    Marital Status: Widowed   Intimate Partner Violence: Not At Risk (09/06/2023)   Humiliation, Afraid, Rape, and Kick questionnaire    Fear of Current or Ex-Partner: No    Emotionally Abused: No    Physically Abused: No    Sexually Abused: No    Family History:    Family History  Problem Relation Age of Onset   Alzheimer's disease Mother    Heart attack Father    Aneurysm Sister      ROS:  Please see the history of present illness.   All other ROS reviewed and negative.     Physical Exam/Data:   Vitals:   09/13/23 1000 09/13/23 1100 09/13/23 1202 09/13/23 1231  BP: 109/70 104/69 109/70 100/65  Pulse: 78  72 78 77  Resp: 19 20 20 20   Temp:   98.2 F (36.8 C) 97.8 F (36.6 C)  TempSrc:   Oral Oral  SpO2: 98% 97% 99% 98%  Weight:      Height:        Intake/Output Summary (Last 24 hours) at 09/13/2023 1355 Last data filed at 09/13/2023 0711 Gross per 24 hour  Intake --  Output 2100 ml  Net -2100 ml      09/13/2023   12:00 AM 09/09/2023    3:00 AM 09/06/2023    4:13 PM  Last 3 Weights  Weight (lbs) 132 lb 7.9 oz 132 lb 6.4 oz 136 lb 11 oz  Weight (kg) 60.1 kg 60.056 kg 62 kg     Body mass index is 23.47 kg/m.  General:  Well nourished, well developed, in no acute distress HEENT: normal Neck: no JVD Vascular: No carotid bruits; Distal pulses 2+ bilaterally Cardiac:  normal S1, S2; RRR; no murmur  Lungs:  breath sounds diminished bilaterally in lower lobes  Abd: soft, nontender, no hepatomegaly  Ext: no edema Musculoskeletal:  No deformities, BUE and BLE strength normal and equal Skin: warm and dry  Neuro:  CNs 2-12 intact, no focal abnormalities noted Psych:  Normal affect   EKG:  The EKG was personally reviewed and demonstrates:  sinus rhythm with chronic LBBB Telemetry:  Telemetry was personally reviewed and demonstrates:  sinus rhythm with isolated PVCs  Relevant CV Studies: 09/08/23 TTE (limited)  IMPRESSIONS     1. Left ventricular ejection fraction, by estimation, is 25 to  30%. The  left ventricle has severely decreased function. The left ventricle  demonstrates global hypokinesis. There is mild left ventricular  hypertrophy.   2. The aortic valve was not well visualized.   3. The inferior vena cava is normal in size with <50% respiratory  variability, suggesting right atrial pressure of 8 mmHg.   FINDINGS   Left Ventricle: Left ventricular ejection fraction, by estimation, is 25  to 30%. The left ventricle has severely decreased function. The left  ventricle demonstrates global hypokinesis. Definity  contrast agent was  given IV to delineate the left  ventricular endocardial borders. There is mild left ventricular  hypertrophy.   Mitral Valve: Mild mitral annular calcification.   Aortic Valve: The aortic valve was not well visualized.   Venous: The inferior vena cava is normal in size with less than 50%  respiratory variability, suggesting right atrial pressure of 8 mmHg.   Laboratory Data:  High Sensitivity Troponin:   Recent Labs  Lab 08/31/23 0443 09/05/23 1818 09/05/23 2016 09/13/23 0055 09/13/23 0159  TROPONINIHS 25* 20* 19* 36* 40*     Chemistry Recent Labs  Lab 09/07/23 1639 09/12/23 1321 09/13/23 0159  NA 136 136 138  K 3.5 4.0 3.7  CL 104 101 103  CO2 20* 24 23  GLUCOSE 108* 117* 91  BUN 16 12 10   CREATININE 1.01* 1.14* 1.12*  CALCIUM  8.0* 8.9 8.8*  MG 1.9  --   --   GFRNONAA 54* 47* 48*  ANIONGAP 12 11 12     Recent Labs  Lab 09/12/23 1321  PROT 6.4*  ALBUMIN  3.2*  AST 70*  ALT 89*  ALKPHOS 54  BILITOT 0.5   Lipids No results for input(s): "CHOL", "TRIG", "HDL", "LABVLDL", "LDLCALC", "CHOLHDL" in the last 168 hours.  Hematology Recent Labs  Lab 09/09/23 0415 09/12/23 1321 09/13/23 0055 09/13/23 0159  WBC 6.8 10.0  --  9.3  RBC 3.45* 3.50* 3.61* 3.62*  HGB 11.0* 11.2*  --  11.7*  HCT 33.7* 34.7*  --  35.3*  MCV 97.7 99.1  --  97.5  MCH 31.9 32.0  --  32.3  MCHC 32.6 32.3  --  33.1  RDW 14.8 14.9  --   15.0  PLT 247 316  --  318   Thyroid   Recent Labs  Lab 09/13/23 0055  TSH 1.737    BNP Recent Labs  Lab 09/12/23 1321  BNP 3,510.6*    DDimer  Recent Labs  Lab 09/12/23 1321  DDIMER 1.71*     Radiology/Studies:  CT Angio Chest PE W/Cm &/Or Wo Cm Result Date: 09/12/2023 CLINICAL DATA:  Suspected pulmonary embolism. EXAM: CT ANGIOGRAPHY CHEST WITH CONTRAST TECHNIQUE: Multidetector CT imaging of the chest was performed using the standard protocol during bolus administration of intravenous contrast. Multiplanar CT image reconstructions and MIPs were obtained to evaluate the vascular anatomy. RADIATION DOSE REDUCTION: This exam was performed according to the departmental dose-optimization program which includes automated exposure control, adjustment of the mA and/or kV according to patient size and/or use of iterative reconstruction technique. CONTRAST:  75mL OMNIPAQUE  IOHEXOL  350 MG/ML SOLN COMPARISON:  August 30, 2023 FINDINGS: Cardiovascular: Marked severity calcification of the aortic arch is seen without evidence of aortic aneurysm. The bilateral lower lobe subsegmental pulmonary arteries are limited in evaluation secondary to suboptimal opacification with intravenous contrast and overlying artifact. No evidence of pulmonary embolism. The main pulmonary artery measures 3.7 cm in diameter. Normal heart size with marked severity coronary artery calcification. No pericardial effusion. Mediastinum/Nodes: No enlarged mediastinal, hilar, or axillary lymph nodes. Thyroid  gland, trachea, and esophagus demonstrate no significant findings. Lungs/Pleura: Mild to moderate severity posterior bilateral upper lobe and bilateral lower lobe dependent atelectasis is seen. A predominantly stable 3 mm anterior right middle lobe pulmonary nodule is noted (axial CT image 87, CT series 6). Moderate to large bilateral pleural effusions are noted. No pneumothorax is identified. Upper Abdomen: A 9 mm hepatic cyst  versus hemangioma is suspected within the anterior aspect of the left lobe of the liver. Musculoskeletal: A chronic compression fracture deformity and subsequent vertebroplasty are seen at the level of T6. Review of the MIP images confirms the above findings. IMPRESSION: 1. No evidence of pulmonary embolism. 2. Moderate to large bilateral pleural effusions with mild to moderate severity bilateral upper lobe and bilateral lower lobe dependent atelectasis. 3. Marked severity coronary artery calcification. 4. Chronic compression fracture deformity and subsequent vertebroplasty at the level of T6. 5. Aortic atherosclerosis. Electronically Signed   By: Virgle Grime M.D.   On: 09/12/2023 19:16   DG Chest Port 1 View Result Date: 09/12/2023 CLINICAL DATA:  Shortness of breath. EXAM: PORTABLE CHEST 1 VIEW COMPARISON:  09/06/2023 FINDINGS: Stable cardiomegaly. Unchanged mediastinal contours. Aortic atherosclerosis. Improvement in pulmonary edema from prior exam, however mild residual. There are small bilateral pleural effusions. Improving retrocardiac opacity with mild residual. IMPRESSION: 1. Unchanged cardiomegaly. Improvement in pulmonary edema from prior exam, however mild residual. 2. Small bilateral pleural effusions. 3. Improving retrocardiac opacity with mild residual. Electronically Signed   By: Chadwick Colonel M.D.   On: 09/12/2023 15:49     Assessment and Plan:   Acute on chronic HFrEF Bilateral pleural effusions Patient with known non-ischemic cardiomyopathy, LVEF 25-30% presented to the ED with worsening exertional dyspnea. Patient recently discharged after chest pain admission during which she experienced flash pulmonary edema and had elevated LVEDP on LHC. She was diuresed  and euvolemic at discharge. Has since had progressive dyspnea. BNP this admission 3510.6. CTA chest with moderate bilateral pleural effusions.  Symptomatically improved with IV lasix . Continue with 40mg  IV BID dosing. Given  rapid onset of symptoms from discharge, suspect she will need daily loop diuretic upon leaving this admission. Continue Toprol  XL 12.5mg  Continue Farxiga  10mg  Could consider low dose MRA, though with history of dizziness and low normal BP, may not tolerate.  Suspect she will have significant improvement in dyspnea with thoracentesis. Both patient and son have a strong interest in working with case management/social work regarding home health vs SNF placement. They worry about more recurrent admissions if she returns home without assistance.   CAD s/p RCA PCI Patient with 2 recent admissions 2/2 chest pain, most recently discharged on 5/8 after LHC found stable CAD, unchanged disease from prior evaluation. No chest pain this admission.  Continue ASA 81mg  (no Plavix  with new Eliquis ). Continue Toprol  XL 12.5mg  Continue Crestor  40mg  Continue Ranexa  500mg  q12hr.  Paroxysmal atrial fibrillation Secondary hypercoagulable state Patient with new onset afib last week during admission. She had rapid conversion to NSR with IV amiodarone , was started on Eliquis . Remains in sinus rhythm with LBBB morphology. Heparin  bridging with possible thoracentesis this admission. Resume Eliquis  when able Continue Toprol  XL 12.5mg    Risk Assessment/Risk Scores:     New York  Heart Association (NYHA) Functional Class NYHA Class III  CHA2DS2-VASc Score = 5   This indicates a 7.2% annual risk of stroke. The patient's score is based upon: CHF History: 1 HTN History: 0 Diabetes History: 0 Stroke History: 0 Vascular Disease History: 1 Age Score: 2 Gender Score: 1   For questions or updates, please contact [facility] Please consult [email or web address] for contact info under   Signed, Leala Prince, PA-C  09/13/2023 1:55 PM  Patient seen and examined, note reviewed with the signed Advanced Practice Provider. I personally reviewed laboratory data, imaging studies and relevant notes. I independently  examined the patient and formulated the important aspects of the plan. I have personally discussed the plan with the patient and/or family. Comments or changes to the note/plan are indicated below.  Patient was seen and examined at her bedside. Her son was present during my visit.   Acute on chronic heart failure with reduced ejection fraction  Bilateral pleural effusion  CAD s/p RCA PCI PAF on oral AC  She is clinically volume overloaded.  Now she also does have bilateral pleural effusion. She is pending thoracocentesis tomorrow which I think is a good thing.  But I think we need a long-term strategy with her diuretics.  Continue with Lasix  40 mg twice daily agree with that.  Will continue to monitor her closely.  In terms of her guideline directed medical therapy continue Toprol  XL as well as a Farxiga . She is not complaining of any anginal symptoms continue her medication regimen she is on aspirin  81 mg daily, not on DAPT due to her Eliquis .  Will continue her Crestor  as well as Ranexa . She is not in sinus rhythm, will continue to monitor this.  For stroke prevention she is on heparin  bridge for now given plans for likely thoracocentesis tomorrow.  Overall we will need to work in a long-term strategy for her diuresis at home.  Before right now we will make sure she is acutely diuresed to make those decisions prior to discharge.  Trice Aspinall DO, MS [facility] Attending Cardiologist [facility]  [phone]  [place], [place] [address] [phone] Website: [email or web address]

## 2023-09-13 NOTE — Progress Notes (Signed)
 PHARMACY - ANTICOAGULATION CONSULT NOTE  Pharmacy Consult for heparin  Indication: atrial fibrillation  No Known Allergies  Patient Measurements: Height: 5\' 3"  (160 cm) Weight: 60.1 kg (132 lb 7.9 oz) IBW/kg (Calculated) : 52.4 HEPARIN  DW (KG): 60.1  Vital Signs: Temp: 97.8 F (36.6 C) (05/12 1231) Temp Source: Oral (05/12 1231) BP: 100/65 (05/12 1231) Pulse Rate: 77 (05/12 1231)  Labs: Recent Labs    09/12/23 1321 09/13/23 0055 09/13/23 0159 09/13/23 1228  HGB 11.2*  --  11.7*  --   HCT 34.7*  --  35.3*  --   PLT 316  --  318  --   APTT  --   --   --  65*  CREATININE 1.14*  --  1.12*  --   TROPONINIHS  --  36* 40*  --     Estimated Creatinine Clearance: 29.3 mL/min (A) (by C-G formula based on SCr of 1.12 mg/dL (H)).   Medical History: Past Medical History:  Diagnosis Date   Chronic HFrEF (heart failure with reduced ejection fraction) (HCC)    Coronary artery disease    PTCA/stenting of RCA 2005, moderate residual 70% stenosis of LAD stable by last cath 2021   Frequent PVCs    Hyperlipidemia    Hypothyroidism    LBBB (left bundle branch block)    Mild carotid artery disease (HCC)    Osteoporosis    Syncope     Medications:  -Eliquis  2.5mg  PO BID (LD 5/11 @10 :30)  Assessment: 60 yoF presented to ED with SOB/weakness. PMH includes atrial fibrillation (on eliquis ), CAD including stent to RCA in 2005 and moderate-severe LAD stenosis, and HFrEF. Pharmacy consulted to dose heparin  for atrial fibrillation.  -aPTT 65 and below goal on 900 units/hr -plans for thoracentesis on 5/13  Goal of Therapy:  Heparin  level 0.3-0.7 units/ml aPTT 66-102 seconds Monitor platelets by anticoagulation protocol: Yes   Plan:  -Increase heparin  to 1000 units/hr -Per MD request, will hold heparin  at midnight for thoracentesis on 5/12  Baxter Limber, PharmD Clinical Pharmacist **Pharmacist phone directory can now be found on [email or web address] ([email or web address]).  Listed under [facility]  Pharmacy.

## 2023-09-13 NOTE — Progress Notes (Addendum)
 PROGRESS NOTE    Susan [name]  ZOX:[id number] DOB: 1933/09/29 DOA: 09/12/2023 PCP: Aldo Hun, MD    89/F w CAD status post RCA PCI in 2005, residual LAD stenosis, chronic HFrEF with chronic LBBB frequent PVCs, pituitary macroadenoma, chronic kidney disease stage III, chronic anemia was recently admitted to [facility] on 09/06/2023 with chest pain underwent cardiac cath which showed 75% mLAd dz, stable, and medically managed, she was also treated for CHF and diagnosed with new A-fib, started on Toprol  and Eliquis . - Since discharge 5/8 reports progressive dyspnea on exertion, labs noted BNP of 3500 and CTA chest- bilateral large pleural effusion.  Patient started on Lasix  40 mg IV and admitted for acute on chronic HFrEF.  EKG shows sinus rhythm with LBBB.  Troponins are pending.  Creatinine is 1.1 and hemoglobin 11.2.  Subjective: - Worried about frequent hospitalizations  Assessment and Plan:  Acute on chronic HFrEF Moderate mitral regurgitation - Echo 4/25 noted EF 25-30%, normal RV, moderate mitral regurgitation  - CT with moderate bilateral pleural effusions, worse on the right, will request thoracentesis tomorrow, Eliquis  held on admission overnight, hold heparin  around mightnight -IV Lasix , Farxiga  - Add Aldactone tomorrow if blood pressure tolerates post thora, history of orthostatic hypotension - Increase activity, PT OT eval  CAD status post recent cardiac cath showed 75% mid LAD lesion, medical management was recommended  - Continue aspirin , Toprol , statin   Paroxysmal atrial fibrillation on Toprol -XL and heparin  infusion, resume Eliquis  postthoracentesis tomorrow  Chronic kidney disease stage IIIa -creatinine at around baseline.  Anemia likely from renal disease follow anemia panel.  Monitor  Hypothyroidism  - Continue Synthroid   Depression on Effexor .  Elevated LFTs likely from CHF.   - Trend  DVT prophylaxis: IV heparin  Code Status: Discussed CODE STATUS with the  patient, DNR Family Communication: Son at bedside Disposition Plan: To be determined  Consultants:    Procedures:   Antimicrobials:    Objective: Vitals:   09/13/23 0946 09/13/23 1000 09/13/23 1100 09/13/23 1202  BP:  109/70 104/69 109/70  Pulse: 75 78 72 78  Resp: 15 19 20 20   Temp: (!) 97.5 F (36.4 C)   98.2 F (36.8 C)  TempSrc:    Oral  SpO2: 97% 98% 97% 99%  Weight:      Height:        Intake/Output Summary (Last 24 hours) at 09/13/2023 1217 Last data filed at 09/13/2023 8119 Gross per 24 hour  Intake --  Output 2100 ml  Net -2100 ml   Filed Weights   09/13/23 0000  Weight: 60.1 kg    Examination:  General exam: Appears calm and comfortable  HEENT: Pos JVD Respiratory system: B/l rales  Cardiovascular system: S1 & S2 heard, RRR.  Abd: nondistended, soft and nontender.Normal bowel sounds heard. Extremities: trace edema Skin: No rashes Psychiatry:  Mood & affect appropriate.     Data Reviewed:   CBC: Recent Labs  Lab 09/07/23 0331 09/08/23 0306 09/09/23 0415 09/12/23 1321 09/13/23 0159  WBC 8.1 6.9 6.8 10.0 9.3  NEUTROABS  --   --   --  6.3  --   HGB 10.6* 11.0* 11.0* 11.2* 11.7*  HCT 32.2* 33.2* 33.7* 34.7* 35.3*  MCV 97.9 96.2 97.7 99.1 97.5  PLT 199 215 247 316 318   Basic Metabolic Panel: Recent Labs  Lab 09/07/23 0331 09/07/23 1639 09/12/23 1321 09/13/23 0159  NA 138 136 136 138  K 3.6 3.5 4.0 3.7  CL 107 104 101 103  CO2 21* 20* 24 23  GLUCOSE 94 108* 117* 91  BUN 14 16 12 10   CREATININE 1.03* 1.01* 1.14* 1.12*  CALCIUM  7.9* 8.0* 8.9 8.8*  MG  --  1.9  --   --    GFR: Estimated Creatinine Clearance: 29.3 mL/min (A) (by C-G formula based on SCr of 1.12 mg/dL (H)). Liver Function Tests: Recent Labs  Lab 09/12/23 1321  AST 70*  ALT 89*  ALKPHOS 54  BILITOT 0.5  PROT 6.4*  ALBUMIN  3.2*   No results for input(s): "LIPASE", "AMYLASE" in the last 168 hours. No results for input(s): "AMMONIA" in the last 168  hours. Coagulation Profile: No results for input(s): "INR", "PROTIME" in the last 168 hours. Cardiac Enzymes: No results for input(s): "CKTOTAL", "CKMB", "CKMBINDEX", "TROPONINI" in the last 168 hours. BNP (last 3 results) No results for input(s): "PROBNP" in the last 8760 hours. HbA1C: No results for input(s): "HGBA1C" in the last 72 hours. CBG: Recent Labs  Lab 09/06/23 1342 09/08/23 2101 09/09/23 0604 09/09/23 1237  GLUCAP 187* 101* 103* 110*   Lipid Profile: No results for input(s): "CHOL", "HDL", "LDLCALC", "TRIG", "CHOLHDL", "LDLDIRECT" in the last 72 hours. Thyroid  Function Tests: Recent Labs    09/13/23 0055  TSH 1.737   Anemia Panel: Recent Labs    09/13/23 0055  VITAMINB12 2,146*  FOLATE 16.3  FERRITIN 127  TIBC 368  IRON 41  RETICCTPCT 3.2*   Urine analysis:    Component Value Date/Time   COLORURINE STRAW (A) 03/06/2023 1420   APPEARANCEUR CLEAR 03/06/2023 1420   LABSPEC 1.018 03/06/2023 1420   PHURINE 7.0 03/06/2023 1420   GLUCOSEU NEGATIVE 03/06/2023 1420   HGBUR NEGATIVE 03/06/2023 1420   BILIRUBINUR NEGATIVE 03/06/2023 1420   KETONESUR 5 (A) 03/06/2023 1420   PROTEINUR NEGATIVE 03/06/2023 1420   UROBILINOGEN 0.2 07/13/2011 2036   NITRITE NEGATIVE 03/06/2023 1420   LEUKOCYTESUR NEGATIVE 03/06/2023 1420   Sepsis Labs: @LABRCNTIP (procalcitonin:4,lacticidven:4)  ) Recent Results (from the past 240 hours)  MRSA Next Gen by PCR, Nasal     Status: None   Collection Time: 09/06/23  4:20 PM   Specimen: Nasal Mucosa; Nasal Swab  Result Value Ref Range Status   MRSA by PCR Next Gen NOT DETECTED NOT DETECTED Final    Comment: (NOTE) The GeneXpert MRSA Assay (FDA approved for NASAL specimens only), is one component of a comprehensive MRSA colonization surveillance program. It is not intended to diagnose MRSA infection nor to guide or monitor treatment for MRSA infections. Test performance is not FDA approved in patients less than 14  years old. Performed at [facility], [address] [place], [place] [address]      Radiology Studies: CT Angio Chest PE W/Cm &/Or Wo Cm Result Date: 09/12/2023 CLINICAL DATA:  Suspected pulmonary embolism. EXAM: CT ANGIOGRAPHY CHEST WITH CONTRAST TECHNIQUE: Multidetector CT imaging of the chest was performed using the standard protocol during bolus administration of intravenous contrast. Multiplanar CT image reconstructions and MIPs were obtained to evaluate the vascular anatomy. RADIATION DOSE REDUCTION: This exam was performed according to the departmental dose-optimization program which includes automated exposure control, adjustment of the mA and/or kV according to patient size and/or use of iterative reconstruction technique. CONTRAST:  75mL OMNIPAQUE  IOHEXOL  350 MG/ML SOLN COMPARISON:  August 30, 2023 FINDINGS: Cardiovascular: Marked severity calcification of the aortic arch is seen without evidence of aortic aneurysm. The bilateral lower lobe subsegmental pulmonary arteries are limited in evaluation secondary to suboptimal opacification with intravenous contrast  and overlying artifact. No evidence of pulmonary embolism. The main pulmonary artery measures 3.7 cm in diameter. Normal heart size with marked severity coronary artery calcification. No pericardial effusion. Mediastinum/Nodes: No enlarged mediastinal, hilar, or axillary lymph nodes. Thyroid  gland, trachea, and esophagus demonstrate no significant findings. Lungs/Pleura: Mild to moderate severity posterior bilateral upper lobe and bilateral lower lobe dependent atelectasis is seen. A predominantly stable 3 mm anterior right middle lobe pulmonary nodule is noted (axial CT image 87, CT series 6). Moderate to large bilateral pleural effusions are noted. No pneumothorax is identified. Upper Abdomen: A 9 mm hepatic cyst versus hemangioma is suspected within the anterior aspect of the left lobe of the liver. Musculoskeletal: A chronic  compression fracture deformity and subsequent vertebroplasty are seen at the level of T6. Review of the MIP images confirms the above findings. IMPRESSION: 1. No evidence of pulmonary embolism. 2. Moderate to large bilateral pleural effusions with mild to moderate severity bilateral upper lobe and bilateral lower lobe dependent atelectasis. 3. Marked severity coronary artery calcification. 4. Chronic compression fracture deformity and subsequent vertebroplasty at the level of T6. 5. Aortic atherosclerosis. Electronically Signed   By: Virgle Grime M.D.   On: 09/12/2023 19:16   DG Chest Port 1 View Result Date: 09/12/2023 CLINICAL DATA:  Shortness of breath. EXAM: PORTABLE CHEST 1 VIEW COMPARISON:  09/06/2023 FINDINGS: Stable cardiomegaly. Unchanged mediastinal contours. Aortic atherosclerosis. Improvement in pulmonary edema from prior exam, however mild residual. There are small bilateral pleural effusions. Improving retrocardiac opacity with mild residual. IMPRESSION: 1. Unchanged cardiomegaly. Improvement in pulmonary edema from prior exam, however mild residual. 2. Small bilateral pleural effusions. 3. Improving retrocardiac opacity with mild residual. Electronically Signed   By: Chadwick Colonel M.D.   On: 09/12/2023 15:49     Scheduled Meds:  aspirin   81 mg Oral Daily   cyanocobalamin   2,000 mcg Oral Daily   dapagliflozin  propanediol  10 mg Oral Daily   furosemide   40 mg Intravenous Q12H   levothyroxine   75 mcg Oral Once per day on Monday Tuesday Wednesday Thursday Friday Saturday   And   [START ON 09/19/2023] levothyroxine   37.5 mcg Oral Every Sunday   metoprolol  succinate  12.5 mg Oral Daily   ranolazine   500 mg Oral BID   rosuvastatin   40 mg Oral Daily   venlafaxine  XR  150 mg Oral Q breakfast   Continuous Infusions:  heparin  900 Units/hr (09/13/23 0057)     LOS: 1 day    Time spent:    Deforest Fast, MD [facility]   09/13/2023, 12:17 PM

## 2023-09-14 ENCOUNTER — Inpatient Hospital Stay (HOSPITAL_COMMUNITY)

## 2023-09-14 DIAGNOSIS — I5023 Acute on chronic systolic (congestive) heart failure: Secondary | ICD-10-CM | POA: Diagnosis not present

## 2023-09-14 LAB — CBC
HCT: 36.9 % (ref 36.0–46.0)
Hemoglobin: 12.3 g/dL (ref 12.0–15.0)
MCH: 32.7 pg (ref 26.0–34.0)
MCHC: 33.3 g/dL (ref 30.0–36.0)
MCV: 98.1 fL (ref 80.0–100.0)
Platelets: 323 10*3/uL (ref 150–400)
RBC: 3.76 MIL/uL — ABNORMAL LOW (ref 3.87–5.11)
RDW: 15.2 % (ref 11.5–15.5)
WBC: 10.1 10*3/uL (ref 4.0–10.5)
nRBC: 0 % (ref 0.0–0.2)

## 2023-09-14 LAB — BASIC METABOLIC PANEL WITH GFR
Anion gap: 9 (ref 5–15)
BUN: 12 mg/dL (ref 8–23)
CO2: 28 mmol/L (ref 22–32)
Calcium: 8.7 mg/dL — ABNORMAL LOW (ref 8.9–10.3)
Chloride: 99 mmol/L (ref 98–111)
Creatinine, Ser: 1.27 mg/dL — ABNORMAL HIGH (ref 0.44–1.00)
GFR, Estimated: 41 mL/min — ABNORMAL LOW (ref >60)
Glucose, Bld: 87 mg/dL (ref 70–99)
Potassium: 4.2 mmol/L (ref 3.5–5.1)
Sodium: 136 mmol/L (ref 135–145)

## 2023-09-14 MED ORDER — APIXABAN 2.5 MG PO TABS
2.5000 mg | ORAL_TABLET | Freq: Two times a day (BID) | ORAL | Status: DC
  Administered 2023-09-14 – 2023-09-15 (×3): 2.5 mg via ORAL
  Filled 2023-09-14 (×3): qty 1

## 2023-09-14 MED ORDER — MIDODRINE HCL 5 MG PO TABS
5.0000 mg | ORAL_TABLET | Freq: Three times a day (TID) | ORAL | Status: DC
  Administered 2023-09-14 – 2023-09-15 (×2): 5 mg via ORAL
  Filled 2023-09-14 (×2): qty 1

## 2023-09-14 MED ORDER — FUROSEMIDE 40 MG PO TABS
40.0000 mg | ORAL_TABLET | Freq: Every day | ORAL | Status: DC
  Administered 2023-09-15: 40 mg via ORAL
  Filled 2023-09-14: qty 1

## 2023-09-14 MED ORDER — LIDOCAINE HCL 1 % IJ SOLN
INTRAMUSCULAR | Status: AC
  Filled 2023-09-14: qty 20

## 2023-09-14 NOTE — Evaluation (Signed)
 Occupational Therapy Evaluation Patient Details Name: Susan [name] MRN: [id number] DOB: Jul 28, 1933 Today's Date: 09/14/2023   History of Present Illness   89 yr old female admitted 09/12/23 for acute CHF. She had recent admission 09/05/23-09/09/23 for chest pain, LHC 09/07/23 showed stable mid CAD disease. PMH of CAD s/p PCI of mid and distal RCA in 2005 with residual moderate pLAD stenosis, LBBB, PVCs, paroxysmal A fib, vasovagal syncope, chronic systolic heart failure, carotid artery disease, HLD, hypothyroidism, pituitary macroadenoma, chronic kidney disease stage III, chronic anemia     Clinical Impressions Pt presents with decline in function and safety with ADLs and ADL mobility with impaired endurance/activity tolerance. PTA pt lives alone, was Ind with ADLs/IADLs, home mgt mgt, cooking , drives and used no AD for mobility. Pt currently requires Sup for safety with ADLs and ADL mobility with no AD required for walking. Pt's O2 SATs dropping to 85-87% on RA during in room activity walking to bathroom, toileting tasks. Pt and son educated on deep, pursed lip breathing andpt able to recover to 89% ~30-45 seconds. Pt at 100% O2 SATs once returned to recliner. Pt and son educated on energy conservation strategies with handout provided, ADL A/E and tub bench for home use with handouts provided. Pt would benefit from acute OT services to maximize level of function and safety     If plan is discharge home, recommend the following:   Assist for transportation;Assistance with cooking/housework     Functional Status Assessment   Patient has had a recent decline in their functional status and demonstrates the ability to make significant improvements in function in a reasonable and predictable amount of time.     Equipment Recommendations   Tub/shower bench     Recommendations for Other Services         Precautions/Restrictions   Precautions Precautions: Fall Restrictions Weight  Bearing Restrictions Per Provider Order: No     Mobility Bed Mobility               General bed mobility comments: pt in chair    Transfers Overall transfer level: Modified independent Equipment used: None Transfers: Sit to/from Stand Sit to Stand: Modified independent (Device/Increase time)                  Balance Overall balance assessment: Needs assistance Sitting-balance support: Feet supported       Standing balance support: No upper extremity supported Standing balance-Leahy Scale: Fair                             ADL either performed or assessed with clinical judgement   ADL Overall ADL's : Needs assistance/impaired Eating/Feeding: Independent   Grooming: Wash/dry hands;Wash/dry face;Supervision/safety;Standing   Upper Body Bathing: Supervision/ safety;Standing   Lower Body Bathing: Supervison/ safety;Sit to/from stand   Upper Body Dressing : Supervision/safety   Lower Body Dressing: Supervision/safety;Sit to/from stand   Toilet Transfer: Modified Independent;Ambulation;Regular Toilet;Grab bars   Toileting- Clothing Manipulation and Hygiene: Modified independent;Sit to/from stand       Functional mobility during ADLs: Modified independent General ADL Comments: Sup for safety. Pt's O2 SATs dropping to 85-87% on RA during in room activity walking to bathroom, toileting tasks. Pt and son educated on deep, pursed lip breathing andpt able to recover to 89% ~30-45 seconds. Pt at 100% O2 SATs once returned to recliner. Pt and son educated on energy conservation strategies with handout provided, ADL A/E and tub bench  for home use with handouts provided     Vision Baseline Vision/History: 1 Wears glasses Ability to See in Adequate Light: 0 Adequate Patient Visual Report: No change from baseline       Perception         Praxis         Pertinent Vitals/Pain Pain Assessment Pain Assessment: No/denies pain Faces Pain Scale: No  hurt Pain Intervention(s): Monitored during session     Extremity/Trunk Assessment Upper Extremity Assessment Upper Extremity Assessment: Generalized weakness   Lower Extremity Assessment Lower Extremity Assessment: Defer to PT evaluation   Cervical / Trunk Assessment Cervical / Trunk Assessment: Normal   Communication Communication Communication: No apparent difficulties   Cognition Arousal: Alert Behavior During Therapy: WFL for tasks assessed/performed Cognition: No apparent impairments                               Following commands: Intact       Cueing  General Comments   Cueing Techniques: Verbal cues  pt on RA throughout, SpO2 with stair training 92%O2. Pt and son inquiring about increased care at home. Pt relates that she has a deposit at [facility], at that she thinks she is ready to go to ALF. Encouraged family to reach out to [facility] about next steps.   Exercises     Shoulder Instructions      Home Living Family/patient expects to be discharged to:: Private residence Living Arrangements: Alone Available Help at Discharge: Family;Available PRN/intermittently Type of Home: House Home Access: Stairs to enter [employer] of Steps: 4 Entrance Stairs-Rails: Right;Left;Can reach both Home Layout: One level     Bathroom Shower/Tub: Chief Strategy Officer: Standard Bathroom Accessibility: Yes How Accessible: Accessible via walker Home Equipment: Other (comment);Adaptive equipment Adaptive Equipment: Reacher Additional Comments: pulse ox      Prior Functioning/Environment Prior Level of Function : Independent/Modified Independent             Mobility Comments: no AD, drives ADLs Comments: Ind with ADLs, IADLs, cooks, gorcery shops    OT Problem List: Decreased activity tolerance;Impaired balance (sitting and/or standing);Decreased knowledge of use of DME or AE   OT Treatment/Interventions:  Self-care/ADL training;Therapeutic activities;Balance training;DME and/or AE instruction;Patient/family education;Energy conservation      OT Goals(Current goals can be found in the care plan section)   Acute Rehab OT Goals Patient Stated Goal: go home OT Goal Formulation: With patient/family Time For Goal Achievement: 09/28/23 Potential to Achieve Goals: Good ADL Goals Pt Will Perform Tub/Shower Transfer: with modified independence;Independently;ambulating;tub bench Additional ADL Goal #1: Pt will tolerate completing ADLs/selfcare tasks Mod I - Ind Additional ADL Goal #2: Pt will verbalize and demo 3 energy conservation stratiegies for ADLs and ADL mobility   OT Frequency:  Min 2X/week    Co-evaluation              AM-PAC OT "6 Clicks" Daily Activity     Outcome Measure Help from another person eating meals?: None Help from another person taking care of personal grooming?: A Little Help from another person toileting, which includes using toliet, bedpan, or urinal?: A Little Help from another person bathing (including washing, rinsing, drying)?: A Little Help from another person to put on and taking off regular upper body clothing?: A Little Help from another person to put on and taking off regular lower body clothing?: A Little 6 Click Score: 19  End of Session Equipment Utilized During Treatment: Gait belt Nurse Communication: Mobility status  Activity Tolerance: Patient tolerated treatment well Patient left: in chair;with call bell/phone within reach;with family/visitor present  OT Visit Diagnosis: Muscle weakness (generalized) (M62.81)                Time: 1610-9604 OT Time Calculation (min): 29 min Charges:  OT General Charges $OT Visit: 1 Visit OT Evaluation $OT Eval Low Complexity: 1 Low OT Treatments $Therapeutic Activity: 8-22 mins    Alfred Ann 09/14/2023, 12:52 PM

## 2023-09-14 NOTE — TOC Initial Note (Signed)
 Transition of Care [facility]) - Initial/Assessment Note    Patient Details  Name: Susan [name] MRN: [id number] Date of Birth: [birth year]  [facility] CM/SW Contact:    Cosimo Diones, RN Phone Number: 09/14/2023, 12:51 PM  Clinical Narrative: Patient presented for shortness of breath. PTA patient was from home alone. Son is in the room during the visit and he lives in [place]. Patient was previously active with [facility] for PT- Case Manager has arranged for RN for disease/medication management and home health aide for a bath. MD is aware to place orders. DME rolling walker with seat to be delivered to the room before she transitions home. No further needs identified at this time.                 Expected Discharge Plan: Home w Home Health Services Barriers to Discharge: No Barriers Identified   Patient Goals and CMS Choice Patient states their goals for this hospitalization and ongoing recovery are:: plan to return home once stable.  Expected Discharge Plan and Services   Discharge Planning Services: CM Consult Post Acute Care Choice: Home Health Living arrangements for the past 2 months: Single Family Home                 DME Arranged: Walker rolling with seat DME Agency: [employer] Date DME Agency Contacted: 09/14/23 Time DME Agency Contacted: 1249 Representative spoke with at DME Agency: Zula Hitch HH Arranged: RN, PT, Disease Management, Nurse's Aide HH Agency: [facility] Date [facility] Agency Contacted: 09/14/23 Time HH Agency Contacted: 1249 Representative spoke with at [facility] Agency: Randel Buss  Prior Living Arrangements/Services Living arrangements for the past 2 months: Single Family Home Lives with:: Self Patient language and need for interpreter reviewed:: Yes Do you feel safe going back to the place where you live?: Yes      Need for Family Participation in Patient Care: No (Comment) Care giver support system in place?: No (comment)    Criminal Activity/Legal Involvement Pertinent to Current Situation/Hospitalization: No - Comment as needed   Permission Sought/Granted Permission sought to share information with : Family Supports, Magazine features editor, Case Estate manager/land agent granted to share information with : Yes, Verbal Permission Granted   Emotional Assessment Appearance:: Appears stated age Attitude/Demeanor/Rapport: Engaged Affect (typically observed): Appropriate Orientation: : Oriented to Self, Oriented to Place, Oriented to  Time, Oriented to Situation Alcohol  / Substance Use: Not Applicable Psych Involvement: No (comment)  Admission diagnosis:  Dyspnea on exertion [R06.09] Transaminitis [R74.01] Acute CHF (congestive heart failure) (HCC) [I50.9] Acute on chronic HFrEF (heart failure with reduced ejection fraction) (HCC) [I50.23] Patient Active Problem List   Diagnosis Date Noted   CKD (chronic kidney disease) stage 3, GFR 30-59 ml/min (HCC) 09/13/2023   Acute CHF (congestive heart failure) (HCC) 09/12/2023   Acute on chronic HFrEF (heart failure with reduced ejection fraction) (HCC) 09/12/2023   Anemia 09/12/2023   Bilateral pleural effusion 09/12/2023   Elevated LFTs 09/12/2023   PAF (paroxysmal atrial fibrillation) (HCC) 09/12/2023   Depression 09/12/2023   Unstable angina (HCC) 09/05/2023   Weakness 07/16/2022   Osteoporosis 09/16/2021   Chest pain 03/21/2020   Syncope and collapse 11/13/2018   Syncope 11/12/2018   Fall 11/12/2018   Right clavicle fracture 11/12/2018   Chronic systolic CHF (congestive heart failure) (HCC) 02/11/2018   Coronary artery disease    Dyspnea on exertion 10/08/2017   Cardiomyopathy (HCC) 10/08/2017   Posterior vitreous detachment of both eyes 04/28/2016  Hyperlipidemia 07/18/2014   Nonexudative age-related macular degeneration 07/17/2014   Status post intraocular lens implant 07/17/2014   Pseudophakia 10/25/2013   Anxiety 10/11/2013   Mechanical  complication of intraocular lens 08/12/2012   Nuclear sclerosis 08/12/2012   Posterior capsule opacification, right 08/12/2012   Macular degeneration 08/04/2011   Pituitary adenoma (HCC) 07/15/2011   Arteriosclerosis of coronary artery    Hypothyroidism    PCP:  Aldo Hun, MD Pharmacy:   [facility] [address] [place] - [address] [place] [address] Phone: [phone] Fax: [phone]  Social Drivers of Health (SDOH) Social History: SDOH Screenings   Food Insecurity: No Food Insecurity (09/14/2023)  Housing: Low Risk  (09/14/2023)  Transportation Needs: No Transportation Needs (09/14/2023)  Utilities: At Risk (09/14/2023)  Social Connections: Patient Declined (09/14/2023)  Tobacco Use: Low Risk  (09/12/2023)   Readmission Risk Interventions    09/09/2023    2:43 PM  Readmission Risk Prevention Plan  Post Dischage Appt Complete  Medication Screening Complete  Transportation Screening Complete

## 2023-09-14 NOTE — Progress Notes (Addendum)
 Patient Name: Susan [name] Date of Encounter: 09/14/2023 [facility] Cardiologist: Ahmad Alert, MD   Interval Summary  .    89 yr old female with PMH of CAD s/p PCI of mid and distal RCA in 2005 with residual moderate pLAD stenosis, LBBB, PVCs, paroxysmal A fib, vasovagal syncope, chronic systolic heart failure, carotid artery disease, HLD, hypothyroidism, pituitary macroadenoma, chronic kidney disease stage III, chronic anemia, who is admitted for acute CHF. She had recent admission 09/05/23-09/09/23 for chest pain, LHC 09/07/23 showed stable mid CAD disease, no PCI target, LVEDP .   Patient states she feels fine laying in bed. She denied any chest pain. She states she had not been doing much. She has urinated a lot.   Vital Signs .    Vitals:   09/13/23 1934 09/13/23 2325 09/14/23 0346 09/14/23 0510  BP: 101/63 104/65 112/69 107/68  Pulse: 77 77 71   Resp: 16 16 16    Temp: 98.3 F (36.8 C) 97.9 F (36.6 C) 97.8 F (36.6 C)   TempSrc: Oral Oral Oral   SpO2: 99% 98% 96%   Weight:   57.5 kg   Height:        Intake/Output Summary (Last 24 hours) at 09/14/2023 0739 Last data filed at 09/14/2023 0646 Gross per 24 hour  Intake --  Output 1000 ml  Net -1000 ml      09/14/2023    3:46 AM 09/13/2023   12:00 AM 09/09/2023    3:00 AM  Last 3 Weights  Weight (lbs) 126 lb 12.8 oz 132 lb 7.9 oz 132 lb 6.4 oz  Weight (kg) 57.516 kg 60.1 kg 60.056 kg      Telemetry/ECG    Sinus rhythm, frequent PVCs  - Personally Reviewed  Physical Exam .   GEN: No acute distress.   Neck: No JVD Cardiac: RRR, no murmurs, rubs, or gallops.  Respiratory: Clear to auscultation bilaterally. On 2LNC. Pox 93-94%.  GI: Soft, nontender, non-distended  MS: No leg edema  Assessment & Plan .     Acute on chronic systolic heart failure  - presented for DOE - BNP 3510, Hs 36 >40, POA - CTA chest 5/11 showed no PE, Moderate to large bilateral pleural effusions with mild to moderate  severity bilateral upper lobe and bilateral lower lobe dependent atelectasis. - Echo last on 5/7 showed LVEF 25-30%, global hypokinesis, mild LVH, aortic valve was not well visualized, IVC normal  - started on IV Lasix  40mg  BID at admission, Net - ,  weight is down from 132.5 >>126.8 ib, clinically euvolemic, will transition to PO Lasix  40mg  daily, monitor BMP - GDMT: Continue PTA Toprol  XL 12.5mg , Farxiga  10mg , BP is low 90-100s systolic at baseline with hx of dizziness and advanced age, will not add additional agents at this time, consider MRA if BP has room  - agree with thoracentesis if unable wean off oxygen, encourage OOB ambulation today - Follow up arranged 10/05/23   CAD s/p PCI to RCA 2005  - LHC from 09/07/23 with stable mLAD disease, patent RCA stents, no PCI targets  - Hs 36 >40, flat trend, not consistent with ACS - continue medical therapy: ASA 81mg , Toprol  XL 12.5mg , Crestor  40mg , Ranexa  500mg  q12hr   Paroxysmal atrial fibrillation  - new onset A fib during recent admission 09/05/23, had rapid conversion to NSR with IV amiodarone , discharged on Eliquis  and Toprol   - now remains in sinus rhythm with PVCs  - continue PTA Eliquis  and Toprol , if  Eliquis  held for thoracentesis, no need bridge CHADS2VASc 5  Bilateral pleural effusions CKD III Anemia Hypothyroidism  Depression  Debility  - per IM  For questions or updates, please contact [facility] Please consult [email or web address] for contact info under   Signed, Xika Zhao, NP   Patient seen and examined, note reviewed with the signed Advanced Practice Provider. I personally reviewed laboratory data, imaging studies and relevant notes. I independently examined the patient and formulated the important aspects of the plan. I have personally discussed the plan with the patient and/or family. Comments or changes to the note/plan are indicated below.  Acute on chronic systolic heart failure Coronary artery status post PCI  to the RCA Paroxysmal atrial fibrillation Bilateral pleural effusion-has improved thoracocentesis aborted due to not having much fluid.  She has had improvement over the last 24 hours with the current dose of Lasix  that she is on.  I think it is time that we transition her to p.o. Lasix .  The biggest challenge is the fact that her blood pressure keep dropping and we need to set her up for success at home and not readmission due to fluid overload.  With her low blood pressure I am going to add midodrine 5 mg 3 times daily to hopefully help with maintaining a good systolic blood pressure so she can be able to get her diuretics at least once or twice a week at home. In the meantime also please continue Toprol  XL 12.5 mg daily, Farxiga  10 mg daily.  In terms of her coronary artery disease no ischemic evaluation is planned at this time continue her aspirin  81 mg daily, Ranexa  as well as Crestor .  New onset atrial fibrillation she has been started on Eliquis  I agree with this please restart this and she can be discharged home with this.  In the setting of her Eliquis  use recommend stopping the aspirin  81 mg daily at the time of discharge.   Manha Amato DO, MS [facility] Attending Cardiologist [facility]  [address] [place], [place] [address] [phone] Website: [email or web address]

## 2023-09-14 NOTE — Progress Notes (Signed)
 Physical Therapy Evaluation Patient Details Name: Susan [name] MRN: [id number] DOB: 10/25/1933 Today's Date: 09/14/2023  History of Present Illness  89 yr old female admitted 09/12/23 for acute CHF. She had recent admission 09/05/23-09/09/23 for chest pain, LHC 09/07/23 showed stable mid CAD disease. PMH of CAD s/p PCI of mid and distal RCA in 2005 with residual moderate pLAD stenosis, LBBB, PVCs, paroxysmal A fib, vasovagal syncope, chronic systolic heart failure, carotid artery disease, HLD, hypothyroidism, pituitary macroadenoma, chronic kidney disease stage III, chronic anemia  Clinical Impression  PTA pt living alone, in a single story home with 4 steps to enter. Pt independent in mobility, ADLs and iADLs. Pt is currently limited in safe mobility by decreased strength and balance. Pt is mod I for bed mobility, and transfers and supervision for ambulation without AD, and supervision with steps. Pt SpO2 in supine 100%O2 on RA, with ambulation SpO2 on RA 95%O2, and SpO2 with stair climb on RA 92%O2. PT recommending HHPT to work on balance, strength and endurance. PT will continue to follow acutely and refer to Mobility Specialist.         If plan is discharge home, recommend the following: A little help with walking and/or transfers;A little help with bathing/dressing/bathroom;Assist for transportation   Can travel by private vehicle    Yes    Equipment Recommendations Rollator (4 wheels)     Functional Status Assessment Patient has had a recent decline in their functional status and demonstrates the ability to make significant improvements in function in a reasonable and predictable amount of time.     Precautions / Restrictions Precautions Precautions: Fall Restrictions Weight Bearing Restrictions Per Provider Order: No      Mobility  Bed Mobility Overal bed mobility: Modified Independent Bed Mobility: Supine to Sit           General bed mobility comments: HoB elevated no  physical assist required    Transfers Overall transfer level: Modified independent Equipment used: None Transfers: Sit to/from Stand Sit to Stand: Modified independent (Device/Increase time)           General transfer comment: increased time and minor bracing of posterior LE on bed to achieve standing balance, no physical assist required    Ambulation/Gait Ambulation/Gait assistance: Supervision Gait Distance (Feet): 160 Feet Assistive device: None Gait Pattern/deviations: Scissoring, Step-through pattern, Drifts right/left, Narrow base of support, Decreased stride length Gait velocity: dec Gait velocity interpretation: <1.8 ft/sec, indicate of risk for recurrent falls   General Gait Details: pt with short step length and narrow BoS with occasional scissoring steps, ocassional steadying with handrail in hallway, no overt LoB  Stairs Stairs: Yes Stairs assistance: Supervision Stair Management: One rail Right, One rail Left Number of Stairs: 1 (up and over x2)       Balance Overall balance assessment: Needs assistance Sitting-balance support: Feet supported Sitting balance-Leahy Scale: Good     Standing balance support: No upper extremity supported Standing balance-Leahy Scale: Fair                               Pertinent Vitals/Pain Pain Assessment Pain Assessment: No/denies pain    Home Living Family/patient expects to be discharged to:: Private residence Living Arrangements: Alone Available Help at Discharge: Family;Available PRN/intermittently Type of Home: House Home Access: Stairs to enter Entrance Stairs-Rails: Right;Left;Can reach both Entrance Stairs-Number of Steps: 4   Home Layout: One level Home Equipment: None  Prior Function Prior Level of Function : Independent/Modified Independent             Mobility Comments: no AD, drives ADLs Comments: indep     Extremity/Trunk Assessment   Upper Extremity Assessment Upper  Extremity Assessment: Generalized weakness    Lower Extremity Assessment Lower Extremity Assessment: Generalized weakness    Cervical / Trunk Assessment Cervical / Trunk Assessment: Normal  Communication   Communication Communication: No apparent difficulties    Cognition Arousal: Alert Behavior During Therapy: WFL for tasks assessed/performed                             Following commands: Intact             General Comments General comments (skin integrity, edema, etc.): pt on RA throughout, SpO2 with stair training 92%O2,        Assessment/Plan    PT Assessment Patient needs continued PT services  PT Problem List Decreased balance;Decreased strength       PT Treatment Interventions DME instruction;Gait training;Stair training;Functional mobility training;Therapeutic activities;Therapeutic exercise;Balance training;Cognitive remediation;Patient/family education    PT Goals (Current goals can be found in the Care Plan section)  Acute Rehab PT Goals PT Goal Formulation: With patient Time For Goal Achievement: 09/28/23 Potential to Achieve Goals: Fair    Frequency Min 3X/week        AM-PAC PT "6 Clicks" Mobility  Outcome Measure Help needed turning from your back to your side while in a flat bed without using bedrails?: None Help needed moving from lying on your back to sitting on the side of a flat bed without using bedrails?: None Help needed moving to and from a bed to a chair (including a wheelchair)?: A Little Help needed standing up from a chair using your arms (e.g., wheelchair or bedside chair)?: A Little Help needed to walk in hospital room?: A Little Help needed climbing 3-5 steps with a railing? : A Little 6 Click Score: 20    End of Session Equipment Utilized During Treatment: Gait belt Activity Tolerance: Patient tolerated treatment well Patient left: in bed;with call bell/phone within reach;with family/visitor present Nurse  Communication: Mobility status PT Visit Diagnosis: Unsteadiness on feet (R26.81)    Time: 1000-1032 PT Time Calculation (min) (ACUTE ONLY): 32 min   Charges:   PT Evaluation $PT Eval Moderate Complexity: 1 Mod PT Treatments $Gait Training: 8-22 mins PT General Charges $$ ACUTE PT VISIT: 1 Visit         Terrall Bley B. Jewel Mortimer PT, DPT Acute Rehabilitation Services Please use secure chat or  Call Office [phone]   Verlie Glisson Fleet 09/14/2023, 11:02 AM

## 2023-09-14 NOTE — Plan of Care (Signed)
   Problem: Education: Goal: Knowledge of General Education information will improve Description Including pain rating scale, medication(s)/side effects and non-pharmacologic comfort measures Outcome: Progressing   Problem: Health Behavior/Discharge Planning: Goal: Ability to manage health-related needs will improve Outcome: Progressing

## 2023-09-14 NOTE — Progress Notes (Signed)
 PROGRESS NOTE    NELAH CORSINI  ZOX:[id number] DOB: [birth year] DOA: 09/12/2023 PCP: Aldo Hun, MD    89/F w CAD status post RCA PCI in 2005, residual LAD stenosis, chronic HFrEF with chronic LBBB frequent PVCs, pituitary macroadenoma, chronic kidney disease stage III, chronic anemia was recently admitted to [facility] on 09/06/2023 with chest pain underwent cardiac cath which showed 75% mLAd dz, stable, and medically managed, she was also treated for CHF and diagnosed with new A-fib, started on Toprol  and Eliquis . - Since discharge 5/8 reports progressive dyspnea on exertion, labs noted BNP of 3500 and CTA chest- bilateral large pleural effusion.  Patient started on Lasix  40 mg IV and admitted for acute on chronic HFrEF.  EKG shows sinus rhythm with LBBB.  Troponins are pending.  Creatinine is 1.1 and hemoglobin 11.2. - Admitted, started on diuretics, cards following, anticoagulation held for thoracentesis  Subjective: - Feels fair, breathing overall improving  Assessment and Plan:  Acute on chronic HFrEF Moderate mitral regurgitation Bilateral pleural effusion R>L - Echo 4/25 noted EF 25-30%, normal RV, moderate mitral regurgitation  - CT with moderate bilateral pleural effusions, worse on the right,  - Eliquis  has been on hold, will request thoracentesis today  - Continue IV Lasix  1 more day, Farxiga   - Add Aldactone tomorrow if blood pressure tolerates, history of orthostatic hypotension - Increase activity, PT OT eval> patient and family concerned about the need for rehab  CAD status post recent cardiac cath showed 75% mid LAD lesion, medical management was recommended  - Continue aspirin , Toprol , statin   Paroxysmal atrial fibrillation on Toprol -XL and heparin  infusion-held this morning, resume Eliquis  post thoracentesis tonight  Chronic kidney disease stage IIIa -creatinine at around baseline.  Anemia likely from renal disease follow anemia panel.  Monitor  Hypothyroidism  -  Continue Synthroid   Depression on Effexor .  Elevated LFTs likely from CHF.   - Trend  DVT prophylaxis: Heparin  on hold, resume Eliquis  tomorrow Code Status: Discussed CODE STATUS with the patient, DNR Family Communication: Son at bedside yesterday Disposition Plan: To be determined  Consultants:    Procedures:   Antimicrobials:    Objective: Vitals:   09/13/23 2325 09/14/23 0346 09/14/23 0510 09/14/23 0917  BP: 104/65 112/69 107/68 104/61  Pulse: 77 71  71  Resp: 16 16  16   Temp: 97.9 F (36.6 C) 97.8 F (36.6 C)  97.6 F (36.4 C)  TempSrc: Oral Oral  Oral  SpO2: 98% 96%  95%  Weight:  57.5 kg    Height:        Intake/Output Summary (Last 24 hours) at 09/14/2023 0926 Last data filed at 09/14/2023 0800 Gross per 24 hour  Intake --  Output 1700 ml  Net -1700 ml   Filed Weights   09/13/23 0000 09/14/23 0346  Weight: 60.1 kg 57.5 kg    Examination:  General exam: Appears calm and comfortable  HEENT: Pos JVD Respiratory system: B/l rales  Cardiovascular system: S1 & S2 heard, RRR.  Abd: nondistended, soft and nontender.Normal bowel sounds heard. Extremities: trace edema Skin: No rashes Psychiatry:  Mood & affect appropriate.     Data Reviewed:   CBC: Recent Labs  Lab 09/08/23 0306 09/09/23 0415 09/12/23 1321 09/13/23 0159 09/14/23 0339  WBC 6.9 6.8 10.0 9.3 10.1  NEUTROABS  --   --  6.3  --   --   HGB 11.0* 11.0* 11.2* 11.7* 12.3  HCT 33.2* 33.7* 34.7* 35.3* 36.9  MCV 96.2 97.7 99.1 97.5  98.1  PLT 215 247 316 318 323   Basic Metabolic Panel: Recent Labs  Lab 09/07/23 1639 09/12/23 1321 09/13/23 0159 09/14/23 0339  NA 136 136 138 136  K 3.5 4.0 3.7 4.2  CL 104 101 103 99  CO2 20* 24 23 28   GLUCOSE 108* 117* 91 87  BUN 16 12 10 12   CREATININE 1.01* 1.14* 1.12* 1.27*  CALCIUM  8.0* 8.9 8.8* 8.7*  MG 1.9  --   --   --    GFR: Estimated Creatinine Clearance: 25.8 mL/min (A) (by C-G formula based on SCr of 1.27 mg/dL (H)). Liver  Function Tests: Recent Labs  Lab 09/12/23 1321  AST 70*  ALT 89*  ALKPHOS 54  BILITOT 0.5  PROT 6.4*  ALBUMIN  3.2*   No results for input(s): "LIPASE", "AMYLASE" in the last 168 hours. No results for input(s): "AMMONIA" in the last 168 hours. Coagulation Profile: No results for input(s): "INR", "PROTIME" in the last 168 hours. Cardiac Enzymes: No results for input(s): "CKTOTAL", "CKMB", "CKMBINDEX", "TROPONINI" in the last 168 hours. BNP (last 3 results) No results for input(s): "PROBNP" in the last 8760 hours. HbA1C: No results for input(s): "HGBA1C" in the last 72 hours. CBG: Recent Labs  Lab 09/08/23 2101 09/09/23 0604 09/09/23 1237  GLUCAP 101* 103* 110*   Lipid Profile: No results for input(s): "CHOL", "HDL", "LDLCALC", "TRIG", "CHOLHDL", "LDLDIRECT" in the last 72 hours. Thyroid  Function Tests: Recent Labs    09/13/23 0055  TSH 1.737   Anemia Panel: Recent Labs    09/13/23 0055  VITAMINB12 2,146*  FOLATE 16.3  FERRITIN 127  TIBC 368  IRON 41  RETICCTPCT 3.2*   Urine analysis:    Component Value Date/Time   COLORURINE STRAW (A) 03/06/2023 1420   APPEARANCEUR CLEAR 03/06/2023 1420   LABSPEC 1.018 03/06/2023 1420   PHURINE 7.0 03/06/2023 1420   GLUCOSEU NEGATIVE 03/06/2023 1420   HGBUR NEGATIVE 03/06/2023 1420   BILIRUBINUR NEGATIVE 03/06/2023 1420   KETONESUR 5 (A) 03/06/2023 1420   PROTEINUR NEGATIVE 03/06/2023 1420   UROBILINOGEN 0.2 07/13/2011 2036   NITRITE NEGATIVE 03/06/2023 1420   LEUKOCYTESUR NEGATIVE 03/06/2023 1420   Sepsis Labs: @LABRCNTIP (procalcitonin:4,lacticidven:4)  ) Recent Results (from the past 240 hours)  MRSA Next Gen by PCR, Nasal     Status: None   Collection Time: 09/06/23  4:20 PM   Specimen: Nasal Mucosa; Nasal Swab  Result Value Ref Range Status   MRSA by PCR Next Gen NOT DETECTED NOT DETECTED Final    Comment: (NOTE) The GeneXpert MRSA Assay (FDA approved for NASAL specimens only), is one component of a  comprehensive MRSA colonization surveillance program. It is not intended to diagnose MRSA infection nor to guide or monitor treatment for MRSA infections. Test performance is not FDA approved in patients less than 31 years old. Performed at [facility], [address] [place], [place] [address]      Radiology Studies: CT Angio Chest PE W/Cm &/Or Wo Cm Result Date: 09/12/2023 CLINICAL DATA:  Suspected pulmonary embolism. EXAM: CT ANGIOGRAPHY CHEST WITH CONTRAST TECHNIQUE: Multidetector CT imaging of the chest was performed using the standard protocol during bolus administration of intravenous contrast. Multiplanar CT image reconstructions and MIPs were obtained to evaluate the vascular anatomy. RADIATION DOSE REDUCTION: This exam was performed according to the departmental dose-optimization program which includes automated exposure control, adjustment of the mA and/or kV according to patient size and/or use of iterative reconstruction technique. CONTRAST:  75mL OMNIPAQUE  IOHEXOL  350  MG/ML SOLN COMPARISON:  August 30, 2023 FINDINGS: Cardiovascular: Marked severity calcification of the aortic arch is seen without evidence of aortic aneurysm. The bilateral lower lobe subsegmental pulmonary arteries are limited in evaluation secondary to suboptimal opacification with intravenous contrast and overlying artifact. No evidence of pulmonary embolism. The main pulmonary artery measures 3.7 cm in diameter. Normal heart size with marked severity coronary artery calcification. No pericardial effusion. Mediastinum/Nodes: No enlarged mediastinal, hilar, or axillary lymph nodes. Thyroid  gland, trachea, and esophagus demonstrate no significant findings. Lungs/Pleura: Mild to moderate severity posterior bilateral upper lobe and bilateral lower lobe dependent atelectasis is seen. A predominantly stable 3 mm anterior right middle lobe pulmonary nodule is noted (axial CT image 87, CT series 6). Moderate to large  bilateral pleural effusions are noted. No pneumothorax is identified. Upper Abdomen: A 9 mm hepatic cyst versus hemangioma is suspected within the anterior aspect of the left lobe of the liver. Musculoskeletal: A chronic compression fracture deformity and subsequent vertebroplasty are seen at the level of T6. Review of the MIP images confirms the above findings. IMPRESSION: 1. No evidence of pulmonary embolism. 2. Moderate to large bilateral pleural effusions with mild to moderate severity bilateral upper lobe and bilateral lower lobe dependent atelectasis. 3. Marked severity coronary artery calcification. 4. Chronic compression fracture deformity and subsequent vertebroplasty at the level of T6. 5. Aortic atherosclerosis. Electronically Signed   By: Virgle Grime M.D.   On: 09/12/2023 19:16   DG Chest Port 1 View Result Date: 09/12/2023 CLINICAL DATA:  Shortness of breath. EXAM: PORTABLE CHEST 1 VIEW COMPARISON:  09/06/2023 FINDINGS: Stable cardiomegaly. Unchanged mediastinal contours. Aortic atherosclerosis. Improvement in pulmonary edema from prior exam, however mild residual. There are small bilateral pleural effusions. Improving retrocardiac opacity with mild residual. IMPRESSION: 1. Unchanged cardiomegaly. Improvement in pulmonary edema from prior exam, however mild residual. 2. Small bilateral pleural effusions. 3. Improving retrocardiac opacity with mild residual. Electronically Signed   By: Chadwick Colonel M.D.   On: 09/12/2023 15:49     Scheduled Meds:  aspirin   81 mg Oral Daily   cyanocobalamin   2,000 mcg Oral Daily   dapagliflozin  propanediol  10 mg Oral Daily   [START ON 09/15/2023] furosemide   40 mg Oral Daily   levothyroxine   75 mcg Oral Once per day on Monday Tuesday Wednesday Thursday Friday Saturday   And   [START ON 09/19/2023] levothyroxine   37.5 mcg Oral Every Sunday   metoprolol  succinate  12.5 mg Oral Daily   ranolazine   500 mg Oral BID   rosuvastatin   40 mg Oral Daily    venlafaxine  XR  150 mg Oral Q breakfast   Continuous Infusions:     LOS: 2 days    Time spent:    Deforest Fast, MD [facility]   09/14/2023, 9:26 AM

## 2023-09-14 NOTE — Progress Notes (Signed)
 Heart Failure Navigator Progress Note  Assessed for Heart & Vascular TOC clinic readiness.  Patient does not meet criteria due to conservative management with advanced age, has a [facility] appointment on 10/05/2023. No HF TOC per Dr. Drexel Gentles.    Navigator will sign off at this time.   Randie Bustle, BSN, Scientist, clinical (histocompatibility and immunogenetics) Only

## 2023-09-14 NOTE — Progress Notes (Signed)
 Patient presented to IR today for possible thoracentesis. Limited ultrasound examination of the left lung revealed no pleural fluid. Exam of the right showed a scant amount of fluid which was not accessible due to lung anatomy obscuring the percutaneous window.   No procedure performed. Images saved in Epic. Image findings discussed with the patient.   Erol Flanagin, AGACNP-BC 09/14/2023, 9:00 AM

## 2023-09-15 ENCOUNTER — Other Ambulatory Visit (HOSPITAL_COMMUNITY): Payer: Self-pay

## 2023-09-15 DIAGNOSIS — R0609 Other forms of dyspnea: Secondary | ICD-10-CM | POA: Diagnosis not present

## 2023-09-15 DIAGNOSIS — E039 Hypothyroidism, unspecified: Secondary | ICD-10-CM

## 2023-09-15 DIAGNOSIS — I2510 Atherosclerotic heart disease of native coronary artery without angina pectoris: Secondary | ICD-10-CM

## 2023-09-15 DIAGNOSIS — I480 Paroxysmal atrial fibrillation: Secondary | ICD-10-CM | POA: Diagnosis not present

## 2023-09-15 DIAGNOSIS — J90 Pleural effusion, not elsewhere classified: Secondary | ICD-10-CM

## 2023-09-15 DIAGNOSIS — I5023 Acute on chronic systolic (congestive) heart failure: Secondary | ICD-10-CM | POA: Diagnosis not present

## 2023-09-15 LAB — CBC
HCT: 38.3 % (ref 36.0–46.0)
Hemoglobin: 12.7 g/dL (ref 12.0–15.0)
MCH: 32.5 pg (ref 26.0–34.0)
MCHC: 33.2 g/dL (ref 30.0–36.0)
MCV: 98 fL (ref 80.0–100.0)
Platelets: 322 10*3/uL (ref 150–400)
RBC: 3.91 MIL/uL (ref 3.87–5.11)
RDW: 15.2 % (ref 11.5–15.5)
WBC: 9.5 10*3/uL (ref 4.0–10.5)
nRBC: 0 % (ref 0.0–0.2)

## 2023-09-15 LAB — BASIC METABOLIC PANEL WITH GFR
Anion gap: 10 (ref 5–15)
BUN: 14 mg/dL (ref 8–23)
CO2: 23 mmol/L (ref 22–32)
Calcium: 8.4 mg/dL — ABNORMAL LOW (ref 8.9–10.3)
Chloride: 102 mmol/L (ref 98–111)
Creatinine, Ser: 1.23 mg/dL — ABNORMAL HIGH (ref 0.44–1.00)
GFR, Estimated: 43 mL/min — ABNORMAL LOW (ref >60)
Glucose, Bld: 85 mg/dL (ref 70–99)
Potassium: 3.6 mmol/L (ref 3.5–5.1)
Sodium: 135 mmol/L (ref 135–145)

## 2023-09-15 MED ORDER — POTASSIUM CHLORIDE CRYS ER 20 MEQ PO TBCR
40.0000 meq | EXTENDED_RELEASE_TABLET | Freq: Once | ORAL | Status: AC
  Administered 2023-09-15: 40 meq via ORAL
  Filled 2023-09-15: qty 2

## 2023-09-15 MED ORDER — ACETAMINOPHEN 500 MG PO TABS: 1000.0000 mg | ORAL_TABLET | Freq: Three times a day (TID) | ORAL | Status: AC | PRN

## 2023-09-15 MED ORDER — FUROSEMIDE 40 MG PO TABS
ORAL_TABLET | ORAL | 1 refills | Status: DC
Start: 2023-09-16 — End: 2023-10-05
  Filled 2023-09-15: qty 24, 90d supply, fill #0

## 2023-09-15 MED ORDER — MIDODRINE HCL 5 MG PO TABS
5.0000 mg | ORAL_TABLET | Freq: Three times a day (TID) | ORAL | 1 refills | Status: DC
  Filled 2023-09-15: qty 90, 30d supply, fill #0

## 2023-09-15 NOTE — Progress Notes (Signed)
 Mobility Specialist Progress Note;   09/15/23 0943  Mobility  Activity Ambulated with assistance in hallway;Transferred from bed to chair  Level of Assistance Standby assist, set-up cues, supervision of patient - no hands on  Assistive Device Other (Comment) (Handrails)  Distance Ambulated (ft) 100 ft  Activity Response Tolerated well  Mobility Referral Yes  Mobility visit 1 Mobility  Mobility Specialist Start Time (ACUTE ONLY) [id number]  Mobility Specialist Stop Time (ACUTE ONLY) 0954  Mobility Specialist Time Calculation (min) (ACUTE ONLY) 11 min   Pt eager for mobility. Requested assistance to BR, void successful. Required no physical assistance during ambulation, SV. Pt often reaching out to hallway handrails for extra support. VSS throughout. Pt requested to sit in chair at [facility]. Pt left in chair with all needs met, call bell in reach.   Janit Meline Mobility Specialist Please contact via SecureChat or [employer] [phone]

## 2023-09-15 NOTE — Plan of Care (Signed)
  Problem: Education: Goal: Knowledge of General Education information will improve Description: Including pain rating scale, medication(s)/side effects and non-pharmacologic comfort measures Outcome: Adequate for Discharge   Problem: Health Behavior/Discharge Planning: Goal: Ability to manage health-related needs will improve Outcome: Adequate for Discharge   Problem: Clinical Measurements: Goal: Ability to maintain clinical measurements within normal limits will improve Outcome: Adequate for Discharge Goal: Will remain free from infection Outcome: Adequate for Discharge Goal: Diagnostic test results will improve Outcome: Adequate for Discharge Goal: Respiratory complications will improve Outcome: Adequate for Discharge Goal: Cardiovascular complication will be avoided Outcome: Adequate for Discharge   Problem: Activity: Goal: Risk for activity intolerance will decrease Outcome: Adequate for Discharge   Problem: Nutrition: Goal: Adequate nutrition will be maintained Outcome: Adequate for Discharge   Problem: Coping: Goal: Level of anxiety will decrease Outcome: Adequate for Discharge   Problem: Elimination: Goal: Will not experience complications related to bowel motility Outcome: Adequate for Discharge Goal: Will not experience complications related to urinary retention Outcome: Adequate for Discharge   Problem: Pain Managment: Goal: General experience of comfort will improve and/or be controlled Outcome: Adequate for Discharge   Problem: Safety: Goal: Ability to remain free from injury will improve Outcome: Adequate for Discharge   Problem: Skin Integrity: Goal: Risk for impaired skin integrity will decrease Outcome: Adequate for Discharge   Problem: Acute Rehab PT Goals(only PT should resolve) Goal: Pt Will Ambulate Outcome: Adequate for Discharge Goal: Pt Will Go Up/Down Stairs Outcome: Adequate for Discharge   Problem: Acute Rehab OT Goals (only OT  should resolve) Goal: Pt. Will Perform Tub/Shower Transfer Outcome: Adequate for Discharge Goal: Pt/Caregiver Will Perform Home Exercise Program Outcome: Adequate for Discharge Goal: OT Additional ADL Goal #1 Outcome: Adequate for Discharge   Problem: Acute Rehab OT Goals (only OT should resolve) Goal: OT Additional ADL Goal #2 Outcome: Adequate for Discharge

## 2023-09-15 NOTE — Discharge Summary (Signed)
 Physician Discharge Summary   Patient: Susan [name] MRN: [id number] DOB: 1933-08-30  Admit date:     09/12/2023  Discharge date: 09/15/23  Discharge Physician: Justina Oman   PCP: Aldo Hun, MD   Recommendations at discharge:  Repeat complete basic metabolic panel to follow electrolytes, renal function and LFTs. Repeat CBC to follow hemoglobin trend and stability Reassess blood pressure and further adjust midodrine/antihypertensive agents and diuretics as needed.   Discharge Diagnoses: Principal Problem:   Acute on chronic HFrEF (heart failure with reduced ejection fraction) (HCC) Active Problems:   Hypothyroidism   Pituitary adenoma (HCC)   Coronary artery disease   Anemia   Bilateral pleural effusion   Elevated LFTs   PAF (paroxysmal atrial fibrillation) (HCC)   Depression   CKD (chronic kidney disease) stage 3, GFR 30-59 ml/min (HCC)   Brief Hospital admission narrative: 89/F w CAD status post RCA PCI in 2005, residual LAD stenosis, chronic HFrEF with chronic LBBB frequent PVCs, pituitary macroadenoma, chronic kidney disease stage III, chronic anemia was recently admitted to [facility] on 09/06/2023 with chest pain underwent cardiac cath which showed 75% mLAd dz, stable, and medically managed, she was also treated for CHF and diagnosed with new A-fib, started on Toprol  and Eliquis . - Since discharge 5/8 reports progressive dyspnea on exertion, labs noted BNP of 3500 and CTA chest- bilateral large pleural effusion.  Patient started on Lasix  40 mg IV and admitted for acute on chronic HFrEF.  EKG shows sinus rhythm with LBBB.  Troponins are pending.  Creatinine is 1.1 and hemoglobin 11.2. - Admitted, started on diuretics, cards following, anticoagulation held for thoracentesis   Assessment and Plan: Acute on chronic HFrEF Moderate mitral regurgitation Bilateral pleural effusion R>L - Echo 4/25 noted EF 25-30%, normal RV, moderate mitral regurgitation  - CT with moderate bilateral  pleural effusions, worse on the right,  -Small amount of pleural effusion appreciated and no thoracentesis were required - Safe to resume Eliquis  for secondary prevention (PAF). -Following cardiology service recommendation patient will be discharged on midodrine 5 mg 3 times a update and the use of Lasix  40 mg twice a week.  Continue metoprolol  twice daily and the use of Farxiga  - Further GDMT management limited secondary to soft blood pressure. - Patient reports no orthopnea, shortness of breath, lower extremity swelling or chest pain at discharge.  Good saturation on room air appreciated.   CAD status post recent cardiac cath showed 75% mid LAD lesion, medical management was recommended. - Continue the use of metoprolol  and statin - No aspirin  needed while receiving Eliquis . -Patient will also continue the use of Ranexa  and as needed nitroglycerin   - Patient reports no chest pain. - Continue outpatient follow-up with cardiology service.   Paroxysmal atrial fibrillation  - Continue Toprol  and Eliquis  -Outpatient follow-up with cardiology service recommended.  Chronic kidney disease stage IIIa -creatinine at baseline - Continue minimizing nephrotoxic agents and avoiding hypotension - Repeat basic metabolic panel follow-up visit to assess stability.   Anemia  -likely from renal disease follow anemia panel.   - No overt bleeding reported - Continue to follow hemoglobin trend.   Hypothyroidism  - Continue Synthroid   Hyperlipidemia - Continue statin.   Depression on Effexor . -No suicidal ideation or hallucinations appreciated.   Elevated LFTs likely from CHF. - Continue to follow LFTs as an outpatient.  Consultants: Cardiology service Procedures performed: See log for x-ray reports Disposition: Home with home health services. Diet recommendation: Heart healthy/low-sodium diet.  DISCHARGE MEDICATION: Allergies as  of 09/15/2023   No Known Allergies      Medication List      STOP taking these medications    Aspirin  Low Dose 81 MG chewable tablet Generic drug: aspirin        TAKE these medications    acetaminophen  500 MG tablet Commonly known as: TYLENOL  Take 2 tablets (1,000 mg total) by mouth every 8 (eight) hours as needed. What changed: See the new instructions.   CALCIUM  + D PO Take 1 tablet by mouth 2 (two) times daily.   cholecalciferol  25 MCG (1000 UNIT) tablet Commonly known as: VITAMIN D3 Take 1,000 Units by mouth daily after breakfast.   cyanocobalamin  1000 MCG tablet Commonly known as: VITAMIN B12 Take 2,000 mcg by mouth daily.   denosumab  60 MG/ML Sosy injection Commonly known as: PROLIA  Inject 60 mg into the skin every 6 (six) months.   Eliquis  2.5 MG Tabs tablet Generic drug: apixaban  Take 1 tablet (2.5 mg total) by mouth 2 (two) times daily.   Farxiga  10 MG Tabs tablet Generic drug: dapagliflozin  propanediol Take 1 tablet (10 mg total) by mouth daily.   furosemide  40 MG tablet Commonly known as: LASIX  Twice a week (Tuesday and Friday) Start taking on: Sep 16, 2023   levothyroxine  75 MCG tablet Commonly known as: SYNTHROID  Take 37.5-75 mcg by mouth See admin instructions. Take 1 tablet by mouth daily, except on Sunday, take 1/2 tablet   metoprolol succinate 25 MG 24 hr tablet Commonly known as: TOPROL-XL Take 0.5 tablets (12.5 mg total) by mouth daily.   midodrine 5 MG tablet Commonly known as: PROAMATINE Take 1 tablet (5 mg total) by mouth 3 (three) times daily with meals.   multivitamin-lutein Caps capsule Take 1 capsule by mouth 2 (two) times a day.   nitroGLYCERIN 0.4 MG SL tablet Commonly known as: NITROSTAT Place 1 tablet (0.4 mg total) under the tongue every 5 (five) minutes as needed. What changed: reasons to take this   ranolazine 500 MG 12 hr tablet Commonly known as: RANEXA Take 1 tablet (500 mg total) by mouth 2 (two) times daily.   rosuvastatin 40 MG tablet Commonly known as: CRESTOR Take  1 tablet (40 mg total) by mouth daily.   venlafaxine XR 150 MG 24 hr capsule Commonly known as: EFFEXOR-XR Take 1 capsule (150 mg total) by mouth daily with breakfast.               Durable Medical Equipment  (From admission, onward)           Start     Ordered   09/14/23 1227  For home use only DME 4 wheeled rolling walker with seat  Once       Question Answer Comment  Patient needs a walker to treat with the following condition General weakness   Patient needs a walker to treat with the following condition Acute on chronic heart failure (HCC)      05 /13/25 1227            Follow-up Information     [facility] Follow up.   Specialty: Home Health Services Why: Registered Nurse, Physical Therapy and Aide-office to call with visit times. Contact information: [address] [place] [address] [phone]         [facility] Follow up.   Why: Rolling walker with seat-office to deliver to the room. Contact information: [address], [place], [place] [address]  16 mi [phone]  Discharge Exam: Filed Weights   09/13/23 0000 09/14/23 0346 09/15/23 0500  Weight: 60.1 kg 57.5 kg 57.4 kg   General exam: Alert, awake, oriented x 3; in no acute distress.  Good saturation on room air. Respiratory system: Improved air movement bilaterally; no wheezing, no crackles, no using accessory muscles. Cardiovascular system: Rate controlled, no rubs, no gallops, no JVD on exam. Gastrointestinal system: Abdomen is nondistended, soft and nontender. No organomegaly or masses felt. Normal bowel sounds heard. Central nervous system: Moving 4 limbs spontaneously.  No focal neurological deficits. Extremities: No cyanosis, clubbing or edema. Skin: No petechiae. Psychiatry: Judgement and insight appear normal. Mood & affect appropriate.    Condition at discharge: Stable and improved.  The results of  significant diagnostics from this hospitalization (including imaging, microbiology, ancillary and laboratory) are listed below for reference.   Imaging Studies: IR US  CHEST Result Date: 09/14/2023 CLINICAL DATA:  Patient with a history of CHF and imaging showing moderate to large bilateral pleural effusions. Interventional radiology asked to evaluate patient for possible thoracentesis. EXAM: CHEST ULTRASOUND COMPARISON:  CTA chest 09/12/2023 FINDINGS: Limited ultrasound examination of the left lung fields revealed no pleural effusion. Limited ultrasound examination of the right lung field showed trace pleural fluid not amenable to percutaneous intervention. No procedure performed. IMPRESSION: Insufficient volume of pleural fluid for safe thoracentesis. Thoracentesis was NOT performed. Images obtained by Jetta Morrow NP Electronically Signed   By: Art Largo M.D.   On: 09/14/2023 10:17   CT Angio Chest PE W/Cm &/Or Wo Cm Result Date: 09/12/2023 CLINICAL DATA:  Suspected pulmonary embolism. EXAM: CT ANGIOGRAPHY CHEST WITH CONTRAST TECHNIQUE: Multidetector CT imaging of the chest was performed using the standard protocol during bolus administration of intravenous contrast. Multiplanar CT image reconstructions and MIPs were obtained to evaluate the vascular anatomy. RADIATION DOSE REDUCTION: This exam was performed according to the departmental dose-optimization program which includes automated exposure control, adjustment of the mA and/or kV according to patient size and/or use of iterative reconstruction technique. CONTRAST:  75mL OMNIPAQUE  IOHEXOL  350 MG/ML SOLN COMPARISON:  August 30, 2023 FINDINGS: Cardiovascular: Marked severity calcification of the aortic arch is seen without evidence of aortic aneurysm. The bilateral lower lobe subsegmental pulmonary arteries are limited in evaluation secondary to suboptimal opacification with intravenous contrast and overlying artifact. No evidence of pulmonary  embolism. The main pulmonary artery measures 3.7 cm in diameter. Normal heart size with marked severity coronary artery calcification. No pericardial effusion. Mediastinum/Nodes: No enlarged mediastinal, hilar, or axillary lymph nodes. Thyroid  gland, trachea, and esophagus demonstrate no significant findings. Lungs/Pleura: Mild to moderate severity posterior bilateral upper lobe and bilateral lower lobe dependent atelectasis is seen. A predominantly stable 3 mm anterior right middle lobe pulmonary nodule is noted (axial CT image 87, CT series 6). Moderate to large bilateral pleural effusions are noted. No pneumothorax is identified. Upper Abdomen: A 9 mm hepatic cyst versus hemangioma is suspected within the anterior aspect of the left lobe of the liver. Musculoskeletal: A chronic compression fracture deformity and subsequent vertebroplasty are seen at the level of T6. Review of the MIP images confirms the above findings. IMPRESSION: 1. No evidence of pulmonary embolism. 2. Moderate to large bilateral pleural effusions with mild to moderate severity bilateral upper lobe and bilateral lower lobe dependent atelectasis. 3. Marked severity coronary artery calcification. 4. Chronic compression fracture deformity and subsequent vertebroplasty at the level of T6. 5. Aortic atherosclerosis. Electronically Signed   By: Virgle Grime M.D.   On: 09/12/2023 19:16  DG Chest Port 1 View Result Date: 09/12/2023 CLINICAL DATA:  Shortness of breath. EXAM: PORTABLE CHEST 1 VIEW COMPARISON:  09/06/2023 FINDINGS: Stable cardiomegaly. Unchanged mediastinal contours. Aortic atherosclerosis. Improvement in pulmonary edema from prior exam, however mild residual. There are small bilateral pleural effusions. Improving retrocardiac opacity with mild residual. IMPRESSION: 1. Unchanged cardiomegaly. Improvement in pulmonary edema from prior exam, however mild residual. 2. Small bilateral pleural effusions. 3. Improving retrocardiac  opacity with mild residual. Electronically Signed   By: Chadwick Colonel M.D.   On: 09/12/2023 15:49   ECHOCARDIOGRAM LIMITED Result Date: 09/08/2023    ECHOCARDIOGRAM LIMITED REPORT   Patient Name:   Susan [name] Date of Exam: 09/08/2023 Medical Rec #:  [id number]        Height:       63.0 in Accession #:    [id number]       Weight:       136.7 lb Date of Birth:  12-21-1933         BSA:          1.645 m Patient Age:    89 years         BP:           126/80 mmHg Patient Gender: F                HR:           93 bpm. Exam Location:  Inpatient Procedure: Limited Echo and Intracardiac Opacification Agent (Both Spectral and            Color Flow Doppler were utilized during procedure). Indications:    CHF  History:        Patient has prior history of Echocardiogram examinations, most                 recent 08/06/2023.  Sonographer:    Hersey Lorenzo RDCS Referring Phys: [phone] MICHAEL COOPER IMPRESSIONS  1. Left ventricular ejection fraction, by estimation, is 25 to 30%. The left ventricle has severely decreased function. The left ventricle demonstrates global hypokinesis. There is mild left ventricular hypertrophy.  2. The aortic valve was not well visualized.  3. The inferior vena cava is normal in size with <50% respiratory variability, suggesting right atrial pressure of 8 mmHg. FINDINGS  Left Ventricle: Left ventricular ejection fraction, by estimation, is 25 to 30%. The left ventricle has severely decreased function. The left ventricle demonstrates global hypokinesis. Definity  contrast agent was given IV to delineate the left ventricular endocardial borders. There is mild left ventricular hypertrophy. Mitral Valve: Mild mitral annular calcification. Aortic Valve: The aortic valve was not well visualized. Venous: The inferior vena cava is normal in size with less than 50% respiratory variability, suggesting right atrial pressure of 8 mmHg. LEFT VENTRICLE PLAX 2D LVIDd:         5.50 cm LVIDs:         4.60 cm LV PW:          1.20 cm LV IVS:        1.20 cm  LV Volumes (MOD) LV vol d, MOD A4C: 135.0 ml LV vol s, MOD A4C: 100.2 ml LV SV MOD A4C:     135.0 ml Dorothye Gathers MD Electronically signed by Dorothye Gathers MD Signature Date/Time: 09/08/2023/12:46:20 PM    Final    CARDIAC CATHETERIZATION Result Date: 09/07/2023   The left ventricular ejection fraction is 35-45% by visual estimate. 1.  Moderately severe mid LAD stenosis, unchanged from previous  cardiac catheterization studies 2.  Patent left main, left circumflex, and RCA with patent RCA stents and mild diffuse plaquing but no obstructive disease 3.  Elevated LVEDP of 21 mmHg 4.  At least moderate LV systolic dysfunction with severe hypokinesis of the periapical segments and hypokinesis of both the mid anterior and mid inferior walls, LVEF estimated at 35% Recommendations: Favor medical therapy.  The patient's coronary anatomy is completely stable from her previous catheterization studies in 2021 in 2019.  The LAD is calcified and would likely require atherectomy.  The vessel is only moderate in caliber and considering the stability of the lesion I think ongoing medical treatment is appropriate.   DG Chest Port 1 View Result Date: 09/06/2023 CLINICAL DATA:  Acute respiratory distress EXAM: PORTABLE CHEST 1 VIEW COMPARISON:  Sep 04, 2024 FINDINGS: Worsening pulmonary infiltrates correlate with bilateral pulmonary edema and congestive changes with a small bilateral pleural effusions. Heart is slightly enlarged correlate with CHF Status post prior vertebroplasty upper thoracic spine vertebral body IMPRESSION: Worsening pulmonary infiltrates correlate with bilateral pulmonary edema and congestive changes with a small bilateral pleural effusions. Electronically Signed   By: Fredrich Jefferson M.D.   On: 09/06/2023 14:29   DG Chest Portable 1 View Result Date: 09/05/2023 CLINICAL DATA:  Chest pain EXAM: PORTABLE CHEST 1 VIEW COMPARISON:  Chest x-ray 08/30/2023 FINDINGS: The heart is  enlarged. There is no focal lung infiltrate, pleural effusion or pneumothorax. No acute fractures are seen. Vertebroplasty changes are again seen in the thoracic region. IMPRESSION: Cardiomegaly. No active disease. Electronically Signed   By: Tyron Gallon M.D.   On: 09/05/2023 19:29   LONG TERM MONITOR (3-14 DAYS) Result Date: 08/31/2023   Predominant rhythm is sinus rhythm.   She had 2 episodes of nonsustained ventricular tachycardia.  The fastest interval lasted 8 beats with a maximal heart rate of 207.  The longest episode lasted 7 beats with an average heart rate of 173.  Neither of these were symptomatic.   She had 21 beats of supraventricular tachycardia (nonsustained).  The fastest and longest interval lasted 9 beats with a maximum heart rate of 185 and an average heart rate of 148.   Occasional premature ventricular contractions, rare premature atrial contractions Patch Wear Time:  10 days and 2 hours (2025-04-15T14:26:27-0400 to 2025-04-25T17:19:53-0400) Patient had a min HR of 66 bpm, max HR of 207 bpm, and avg HR of 91 bpm. Predominant underlying rhythm was Sinus Rhythm. Bundle Branch Block/IVCD was present. 2 Ventricular Tachycardia runs occurred, the run with the fastest interval lasting 8 beats with  a max rate of 207 bpm, the longest lasting 7 beats with an avg rate of 173 bpm. 21 Supraventricular Tachycardia runs occurred, the run with the fastest interval lasting 9 beats with a max rate of 185 bpm (avg 148 bpm); the run with the fastest interval was also the longest. Isolated SVEs were occasional (3.6%, 36157), SVE Couplets were rare (<1.0%, 910), and SVE Triplets were rare (<1.0%, 119). Isolated VEs were occasional (3.1%, 31021), VE Couplets were rare (<1.0%, 1032), and VE Triplets were rare (<1.0%, 22). Ventricular Bigeminy and Trigeminy were present.   CT Angio Chest PE W/Cm &/Or Wo Cm Result Date: 08/30/2023 CLINICAL DATA:  Pulmonary embolism (PE) suspected, high prob, chest pain,  lightheadedness EXAM: CT ANGIOGRAPHY CHEST WITH CONTRAST TECHNIQUE: Multidetector CT imaging of the chest was performed using the standard protocol during bolus administration of intravenous contrast. Multiplanar CT image reconstructions and MIPs were obtained to evaluate the  vascular anatomy. RADIATION DOSE REDUCTION: This exam was performed according to the departmental dose-optimization program which includes automated exposure control, adjustment of the mA and/or kV according to patient size and/or use of iterative reconstruction technique. CONTRAST:  75mL OMNIPAQUE  IOHEXOL  350 MG/ML SOLN COMPARISON:  03/06/2023, 08/30/2023 FINDINGS: Cardiovascular: This is a technically adequate evaluation of pulmonary vasculature. No filling defects or pulmonary emboli. Heart is enlarged without pericardial effusion. Prominent left ventricular dilatation. Calcifications of the mitral and aortic valves. Normal caliber of the thoracic aorta. Atherosclerosis of the aorta and coronary vasculature. Mediastinum/Nodes: No enlarged mediastinal, hilar, or axillary lymph nodes. Thyroid  gland, trachea, and esophagus demonstrate no significant findings. Lungs/Pleura: No acute airspace disease, effusion, or pneumothorax. Stable bibasilar scarring. Central airways are patent. 4 mm right middle lobe pulmonary nodule reference image 94/6. Upper Abdomen: No acute upper abdominal findings. Musculoskeletal: No acute or destructive bony abnormalities. Reconstructed images demonstrate chronic T6 compression fracture and prior vertebral augmentation. Review of the MIP images confirms the above findings. IMPRESSION: 1. No evidence of pulmonary embolus. 2. Cardiomegaly, with prominent left ventricular dilatation. 3. Right solid pulmonary nodule measuring 4 mm. Per Fleischner Society Guidelines, no routine follow-up imaging is recommended. These guidelines do not apply to immunocompromised patients and patients with cancer. Follow up in patients with  significant comorbidities as clinically warranted. For lung cancer screening, adhere to Lung-RADS guidelines. Reference: Radiology. 2017; 284(1):228-43. 4. Aortic Atherosclerosis (ICD10-I70.0). Coronary artery atherosclerosis. Electronically Signed   By: Bobbye Burrow M.D.   On: 08/30/2023 23:58   DG Chest 2 View Result Date: 08/30/2023 CLINICAL DATA:  Chest pain and lightheadedness. EXAM: CHEST - 2 VIEW COMPARISON:  Portable chest 07/15/2022. FINDINGS: There is mild cardiomegaly with no evidence of CHF. Stable mediastinum with aortic atherosclerosis. Areas of linear atelectasis are noted in the lateral base of the lungs. There is no convincing focal infiltrate. The remaining lungs are clear. Wedging and kyphoplasty in 1 midthoracic segment again noted with osteopenia and degenerative change. IMPRESSION: No evidence of acute chest disease. Lateral basal areas of linear atelectasis. Mild cardiomegaly. Aortic atherosclerosis. Electronically Signed   By: Denman Fischer M.D.   On: 08/30/2023 21:57    Microbiology: Results for orders placed or performed during the hospital encounter of 09/05/23  MRSA Next Gen by PCR, Nasal     Status: None   Collection Time: 09/06/23  4:20 PM   Specimen: Nasal Mucosa; Nasal Swab  Result Value Ref Range Status   MRSA by PCR Next Gen NOT DETECTED NOT DETECTED Final    Comment: (NOTE) The GeneXpert MRSA Assay (FDA approved for NASAL specimens only), is one component of a comprehensive MRSA colonization surveillance program. It is not intended to diagnose MRSA infection nor to guide or monitor treatment for MRSA infections. Test performance is not FDA approved in patients less than 44 years old. Performed at [facility], [address], [place], [place] [address]     Labs: CBC: Recent Labs  Lab 09/09/23 [phone] 09/12/23 1321 09/13/23 0159 09/14/23 0339 09/15/23 0447  WBC 6.8 10.0 9.3 10.1 9.5  NEUTROABS  --  6.3  --   --   --   HGB 11.0* 11.2* 11.7*  12.3 12.7  HCT 33.7* 34.7* 35.3* 36.9 38.3  MCV 97.7 99.1 97.5 98.1 98.0  PLT 247 316 318 323 322   Basic Metabolic Panel: Recent Labs  Lab 09/12/23 1321 09/13/23 0159 09/14/23 0339 09/15/23 0447  NA 136 138 136 135  K 4.0 3.7 4.2 3.6  CL 101 103 99 102  CO2 24 23 28 23   GLUCOSE 117* 91 87 85  BUN 12 10 12 14   CREATININE 1.14* 1.12* 1.27* 1.23*  CALCIUM  8.9 8.8* 8.7* 8.4*   Liver Function Tests: Recent Labs  Lab 09/12/23 1321  AST 70*  ALT 89*  ALKPHOS 54  BILITOT 0.5  PROT 6.4*  ALBUMIN  3.2*   CBG: Recent Labs  Lab 09/08/23 2101 09/09/23 0604 09/09/23 1237  GLUCAP 101* 103* 110*    Discharge time spent: greater than 30 minutes.  Signed: Justina Oman, MD [facility] 09/15/2023

## 2023-09-15 NOTE — Progress Notes (Signed)
 Explained discharge instructions to patient. Reviewed follow up appointment and next medication administration times. Also reviewed education. Patient verbalized having an understanding for instructions given. All belongings are in the patient's possession. Will pick up patient's TOC meds from the pharmacy when transported out for discharge home. IV and telemetry were removed. CCMD was notified. No other needs verbalized. Will transport downstairs for discharge.

## 2023-09-15 NOTE — Progress Notes (Addendum)
 Patient Name: Susan [name] Date of Encounter: 09/15/2023 [facility] Cardiologist: Ahmad Alert, MD    Interval Summary  .    Patient reports feeling well this AM. Denies shortness of breath, chest pain. No dizziness, syncope, lower extremity edema, palpitations. She is concerned that she will start to hold onto fluid again once she is discharged from the hospital. We discussed the role of oral lasix  in preventing fluid buildup.   Vital Signs .    Vitals:   09/14/23 2034 09/14/23 2332 09/15/23 0500 09/15/23 0732  BP: (!) 96/53 105/61 105/63 110/69  Pulse: 75 78    Resp: 18 16 16 17   Temp: 98.3 F (36.8 C) 98.3 F (36.8 C) 98.4 F (36.9 C) 97.8 F (36.6 C)  TempSrc: Oral Oral Oral Oral  SpO2: 92% 95% 95%   Weight:   57.4 kg   Height:        Intake/Output Summary (Last 24 hours) at 09/15/2023 0806 Last data filed at 09/15/2023 0500 Gross per 24 hour  Intake 840 ml  Output 700 ml  Net 140 ml      09/15/2023    5:00 AM 09/14/2023    3:46 AM 09/13/2023   12:00 AM  Last 3 Weights  Weight (lbs) 126 lb 9.6 oz 126 lb 12.8 oz 132 lb 7.9 oz  Weight (kg) 57.425 kg 57.516 kg 60.1 kg      Telemetry/ECG    NSR with occasional PVCs - Personally Reviewed  Physical Exam .   GEN: No acute distress.  Sitting upright in the bed eating breakfast  Neck: No JVD Cardiac:  RRR, no murmurs, rubs, or gallops.  Respiratory: Crackles in bilateral lung bases. Normal WOB on room air.  GI: Soft, nontender, non-distended  MS: No edema in BLE   Assessment & Plan .    Acute on chronic systolic heart failure  - Patient presented with dyspnea on exertion.  BNP elevated to 3500, high-sensitivity troponin 36, 40 - CTA chest on 5/11 showed no PE, moderate to large bilateral pleural effusions. - Echocardiogram 09/08/2023 showed EF 25-30%, mild LVH - Left/right heart cath on 09/07/2023 elevated LVEDP of 21 mmHg - Patient has been on IV Lasix -put out 1.4 L urine yesterday, currently net  -3.3 L since admission.  Weight down from 132-126 pounds - Creatinine bumped from 1.12>1.27 yesterday. Breathing has improved. IV Lasix  stopped, starting oral Lasix  40 mg daily today. - Continue Farxiga  10 mg daily - Discussed role of oral lasix  and farxiga  in preventing fluid accumulation. Also discussed importance of daily weights  - Continue metoprolol  succinate 12.5 mg daily - BP soft, unable to add ARB/Arni, Spiro at this time  CAD - LHC from 09/07/23 with stable mLAD disease, patent RCA stents, no PCI targets  - High-sensitivity troponin 36, 40.  Flat trend, not consistent with ACS - Continue medical therapy with metoprolol  succinate 12.5 mg daily, Crestor  40 mg daily, Ranexa  500 mg twice daily - Stopped aspirin  with Eliquis  start  PAF  PVCs  - New onset atrial fibrillation noted during recent admission 5//25.  Had rapid conversion to normal sinus rhythm on IV amiodarone . - Maintaining NSR per tele with occasional PVCs  - K 3.6- ordered K supplementation to maintain K>4  - Continue metoprolol  succinate 12.5 mg daily -Continue Eliquis  2.5 mg twice daily.  This is the correct dose for her based on age, weight  Otherwise per primary  - Bilateral pleural effusions-CTA chest 5/11 showed moderate-large bilateral pleural  effusions.  Attempted thoracentesis yesterday but there was insufficient volume of pleural fluid and thoracentesis was not performed - CKD stage IIIa - Anemia  - Hypothyroidism  - Depression  - Debility   For questions or updates, please contact [facility] Please consult [email or web address] for contact info under     Signed, Debria Fang, PA-C   Patient seen and examined, note reviewed with the signed Advanced Practice Provider. I personally reviewed laboratory data, imaging studies and relevant notes. I independently examined the patient and formulated the important aspects of the plan. I have personally discussed the plan with the patient and/or family.  Comments or changes to the note/plan are indicated below.  Patient seen examined her bedside.  No complaints at this time.  She is eager to go home.  I think from a cardiovascular standpoint she has done well clinically.  We should go ahead and adjust her medication.  Please keep her on the midodrine 5 mg 3 times daily at the time of discharge.  Use Lasix  40 mg twice weekly (Tuesdays and Fridays),  This will help with cautious diuretics at home.  Might benefit from home therapy but I will defer to the primary team.  Jasyn Mey DO, MS [facility] Attending Cardiologist [facility]  [address] [place], [place] [address] [phone] Website: [email or web address]

## 2023-09-15 NOTE — Plan of Care (Signed)
   Problem: Health Behavior/Discharge Planning: Goal: Ability to manage health-related needs will improve Outcome: Progressing

## 2023-09-15 NOTE — Care Management Important Message (Signed)
 Important Message  Patient Details  Name: Susan [name] MRN: [id number] Date of Birth: 1933-10-31   Important Message Given:  Yes - Medicare IM     Janith Melnick 09/15/2023, 7:48 AM

## 2023-09-17 ENCOUNTER — Telehealth: Payer: Self-pay | Admitting: Cardiovascular Disease

## 2023-09-17 NOTE — Telephone Encounter (Signed)
 We received a [employer] FMLA form for this patient's daughter, Roslynn Coombes.  I called daughter but had to leave a voice mail.  I will keep the form with me until patient signs the release of information and pays the form fee.

## 2023-09-19 DIAGNOSIS — I4719 Other supraventricular tachycardia: Secondary | ICD-10-CM | POA: Diagnosis not present

## 2023-09-19 DIAGNOSIS — R918 Other nonspecific abnormal finding of lung field: Secondary | ICD-10-CM | POA: Diagnosis not present

## 2023-09-19 DIAGNOSIS — Z9181 History of falling: Secondary | ICD-10-CM | POA: Diagnosis not present

## 2023-09-19 DIAGNOSIS — D631 Anemia in chronic kidney disease: Secondary | ICD-10-CM | POA: Diagnosis not present

## 2023-09-19 DIAGNOSIS — D630 Anemia in neoplastic disease: Secondary | ICD-10-CM | POA: Diagnosis not present

## 2023-09-19 DIAGNOSIS — E785 Hyperlipidemia, unspecified: Secondary | ICD-10-CM | POA: Diagnosis not present

## 2023-09-19 DIAGNOSIS — Z7901 Long term (current) use of anticoagulants: Secondary | ICD-10-CM | POA: Diagnosis not present

## 2023-09-19 DIAGNOSIS — Z48812 Encounter for surgical aftercare following surgery on the circulatory system: Secondary | ICD-10-CM | POA: Diagnosis not present

## 2023-09-19 DIAGNOSIS — E039 Hypothyroidism, unspecified: Secondary | ICD-10-CM | POA: Diagnosis not present

## 2023-09-19 DIAGNOSIS — I447 Left bundle-branch block, unspecified: Secondary | ICD-10-CM | POA: Diagnosis not present

## 2023-09-19 DIAGNOSIS — Z7982 Long term (current) use of aspirin: Secondary | ICD-10-CM | POA: Diagnosis not present

## 2023-09-19 DIAGNOSIS — M810 Age-related osteoporosis without current pathological fracture: Secondary | ICD-10-CM | POA: Diagnosis not present

## 2023-09-19 DIAGNOSIS — J9811 Atelectasis: Secondary | ICD-10-CM | POA: Diagnosis not present

## 2023-09-19 DIAGNOSIS — I2510 Atherosclerotic heart disease of native coronary artery without angina pectoris: Secondary | ICD-10-CM | POA: Diagnosis not present

## 2023-09-19 DIAGNOSIS — N1831 Chronic kidney disease, stage 3a: Secondary | ICD-10-CM | POA: Diagnosis not present

## 2023-09-19 DIAGNOSIS — D352 Benign neoplasm of pituitary gland: Secondary | ICD-10-CM | POA: Diagnosis not present

## 2023-09-19 DIAGNOSIS — I5023 Acute on chronic systolic (congestive) heart failure: Secondary | ICD-10-CM | POA: Diagnosis not present

## 2023-09-19 DIAGNOSIS — Z955 Presence of coronary angioplasty implant and graft: Secondary | ICD-10-CM | POA: Diagnosis not present

## 2023-09-19 DIAGNOSIS — I493 Ventricular premature depolarization: Secondary | ICD-10-CM | POA: Diagnosis not present

## 2023-09-19 DIAGNOSIS — F32A Depression, unspecified: Secondary | ICD-10-CM | POA: Diagnosis not present

## 2023-09-19 DIAGNOSIS — I480 Paroxysmal atrial fibrillation: Secondary | ICD-10-CM | POA: Diagnosis not present

## 2023-09-19 DIAGNOSIS — Z7984 Long term (current) use of oral hypoglycemic drugs: Secondary | ICD-10-CM | POA: Diagnosis not present

## 2023-09-19 DIAGNOSIS — I080 Rheumatic disorders of both mitral and aortic valves: Secondary | ICD-10-CM | POA: Diagnosis not present

## 2023-09-19 DIAGNOSIS — I700 Atherosclerosis of aorta: Secondary | ICD-10-CM | POA: Diagnosis not present

## 2023-09-20 DIAGNOSIS — Z955 Presence of coronary angioplasty implant and graft: Secondary | ICD-10-CM | POA: Diagnosis not present

## 2023-09-20 DIAGNOSIS — I5023 Acute on chronic systolic (congestive) heart failure: Secondary | ICD-10-CM | POA: Diagnosis not present

## 2023-09-20 DIAGNOSIS — E785 Hyperlipidemia, unspecified: Secondary | ICD-10-CM | POA: Diagnosis not present

## 2023-09-20 DIAGNOSIS — Z7901 Long term (current) use of anticoagulants: Secondary | ICD-10-CM | POA: Diagnosis not present

## 2023-09-20 DIAGNOSIS — F32A Depression, unspecified: Secondary | ICD-10-CM | POA: Diagnosis not present

## 2023-09-20 DIAGNOSIS — Z9181 History of falling: Secondary | ICD-10-CM | POA: Diagnosis not present

## 2023-09-20 DIAGNOSIS — Z48812 Encounter for surgical aftercare following surgery on the circulatory system: Secondary | ICD-10-CM | POA: Diagnosis not present

## 2023-09-20 DIAGNOSIS — I509 Heart failure, unspecified: Secondary | ICD-10-CM | POA: Diagnosis not present

## 2023-09-20 DIAGNOSIS — I493 Ventricular premature depolarization: Secondary | ICD-10-CM | POA: Diagnosis not present

## 2023-09-20 DIAGNOSIS — I480 Paroxysmal atrial fibrillation: Secondary | ICD-10-CM | POA: Diagnosis not present

## 2023-09-20 DIAGNOSIS — I2510 Atherosclerotic heart disease of native coronary artery without angina pectoris: Secondary | ICD-10-CM | POA: Diagnosis not present

## 2023-09-20 DIAGNOSIS — D352 Benign neoplasm of pituitary gland: Secondary | ICD-10-CM | POA: Diagnosis not present

## 2023-09-20 DIAGNOSIS — M810 Age-related osteoporosis without current pathological fracture: Secondary | ICD-10-CM | POA: Diagnosis not present

## 2023-09-20 DIAGNOSIS — N1831 Chronic kidney disease, stage 3a: Secondary | ICD-10-CM | POA: Diagnosis not present

## 2023-09-20 DIAGNOSIS — I080 Rheumatic disorders of both mitral and aortic valves: Secondary | ICD-10-CM | POA: Diagnosis not present

## 2023-09-20 DIAGNOSIS — R918 Other nonspecific abnormal finding of lung field: Secondary | ICD-10-CM | POA: Diagnosis not present

## 2023-09-20 DIAGNOSIS — J9811 Atelectasis: Secondary | ICD-10-CM | POA: Diagnosis not present

## 2023-09-20 DIAGNOSIS — D631 Anemia in chronic kidney disease: Secondary | ICD-10-CM | POA: Diagnosis not present

## 2023-09-20 DIAGNOSIS — Z7984 Long term (current) use of oral hypoglycemic drugs: Secondary | ICD-10-CM | POA: Diagnosis not present

## 2023-09-20 DIAGNOSIS — I447 Left bundle-branch block, unspecified: Secondary | ICD-10-CM | POA: Diagnosis not present

## 2023-09-20 DIAGNOSIS — Z7982 Long term (current) use of aspirin: Secondary | ICD-10-CM | POA: Diagnosis not present

## 2023-09-20 DIAGNOSIS — I4719 Other supraventricular tachycardia: Secondary | ICD-10-CM | POA: Diagnosis not present

## 2023-09-20 DIAGNOSIS — D630 Anemia in neoplastic disease: Secondary | ICD-10-CM | POA: Diagnosis not present

## 2023-09-20 DIAGNOSIS — I700 Atherosclerosis of aorta: Secondary | ICD-10-CM | POA: Diagnosis not present

## 2023-09-20 DIAGNOSIS — E039 Hypothyroidism, unspecified: Secondary | ICD-10-CM | POA: Diagnosis not present

## 2023-09-21 NOTE — Telephone Encounter (Signed)
 Returned call to patient who states she will let her daughter know and seek PCP help.

## 2023-09-22 DIAGNOSIS — I080 Rheumatic disorders of both mitral and aortic valves: Secondary | ICD-10-CM | POA: Diagnosis not present

## 2023-09-22 DIAGNOSIS — D631 Anemia in chronic kidney disease: Secondary | ICD-10-CM | POA: Diagnosis not present

## 2023-09-22 DIAGNOSIS — I700 Atherosclerosis of aorta: Secondary | ICD-10-CM | POA: Diagnosis not present

## 2023-09-22 DIAGNOSIS — Z48812 Encounter for surgical aftercare following surgery on the circulatory system: Secondary | ICD-10-CM | POA: Diagnosis not present

## 2023-09-22 DIAGNOSIS — R918 Other nonspecific abnormal finding of lung field: Secondary | ICD-10-CM | POA: Diagnosis not present

## 2023-09-22 DIAGNOSIS — I493 Ventricular premature depolarization: Secondary | ICD-10-CM | POA: Diagnosis not present

## 2023-09-22 DIAGNOSIS — D352 Benign neoplasm of pituitary gland: Secondary | ICD-10-CM | POA: Diagnosis not present

## 2023-09-22 DIAGNOSIS — J9811 Atelectasis: Secondary | ICD-10-CM | POA: Diagnosis not present

## 2023-09-22 DIAGNOSIS — Z7982 Long term (current) use of aspirin: Secondary | ICD-10-CM | POA: Diagnosis not present

## 2023-09-22 DIAGNOSIS — F32A Depression, unspecified: Secondary | ICD-10-CM | POA: Diagnosis not present

## 2023-09-22 DIAGNOSIS — Z7984 Long term (current) use of oral hypoglycemic drugs: Secondary | ICD-10-CM | POA: Diagnosis not present

## 2023-09-22 DIAGNOSIS — E785 Hyperlipidemia, unspecified: Secondary | ICD-10-CM | POA: Diagnosis not present

## 2023-09-22 DIAGNOSIS — N1831 Chronic kidney disease, stage 3a: Secondary | ICD-10-CM | POA: Diagnosis not present

## 2023-09-22 DIAGNOSIS — I2510 Atherosclerotic heart disease of native coronary artery without angina pectoris: Secondary | ICD-10-CM | POA: Diagnosis not present

## 2023-09-22 DIAGNOSIS — M810 Age-related osteoporosis without current pathological fracture: Secondary | ICD-10-CM | POA: Diagnosis not present

## 2023-09-22 DIAGNOSIS — Z955 Presence of coronary angioplasty implant and graft: Secondary | ICD-10-CM | POA: Diagnosis not present

## 2023-09-22 DIAGNOSIS — E039 Hypothyroidism, unspecified: Secondary | ICD-10-CM | POA: Diagnosis not present

## 2023-09-22 DIAGNOSIS — I447 Left bundle-branch block, unspecified: Secondary | ICD-10-CM | POA: Diagnosis not present

## 2023-09-22 DIAGNOSIS — Z7901 Long term (current) use of anticoagulants: Secondary | ICD-10-CM | POA: Diagnosis not present

## 2023-09-22 DIAGNOSIS — D630 Anemia in neoplastic disease: Secondary | ICD-10-CM | POA: Diagnosis not present

## 2023-09-22 DIAGNOSIS — Z9181 History of falling: Secondary | ICD-10-CM | POA: Diagnosis not present

## 2023-09-22 DIAGNOSIS — I5023 Acute on chronic systolic (congestive) heart failure: Secondary | ICD-10-CM | POA: Diagnosis not present

## 2023-09-22 DIAGNOSIS — I4719 Other supraventricular tachycardia: Secondary | ICD-10-CM | POA: Diagnosis not present

## 2023-09-22 DIAGNOSIS — I480 Paroxysmal atrial fibrillation: Secondary | ICD-10-CM | POA: Diagnosis not present

## 2023-09-24 DIAGNOSIS — I700 Atherosclerosis of aorta: Secondary | ICD-10-CM | POA: Diagnosis not present

## 2023-09-24 DIAGNOSIS — J9811 Atelectasis: Secondary | ICD-10-CM | POA: Diagnosis not present

## 2023-09-24 DIAGNOSIS — I447 Left bundle-branch block, unspecified: Secondary | ICD-10-CM | POA: Diagnosis not present

## 2023-09-24 DIAGNOSIS — Z48812 Encounter for surgical aftercare following surgery on the circulatory system: Secondary | ICD-10-CM | POA: Diagnosis not present

## 2023-09-24 DIAGNOSIS — I480 Paroxysmal atrial fibrillation: Secondary | ICD-10-CM | POA: Diagnosis not present

## 2023-09-24 DIAGNOSIS — Z7984 Long term (current) use of oral hypoglycemic drugs: Secondary | ICD-10-CM | POA: Diagnosis not present

## 2023-09-24 DIAGNOSIS — Z7901 Long term (current) use of anticoagulants: Secondary | ICD-10-CM | POA: Diagnosis not present

## 2023-09-24 DIAGNOSIS — R918 Other nonspecific abnormal finding of lung field: Secondary | ICD-10-CM | POA: Diagnosis not present

## 2023-09-24 DIAGNOSIS — I493 Ventricular premature depolarization: Secondary | ICD-10-CM | POA: Diagnosis not present

## 2023-09-24 DIAGNOSIS — F32A Depression, unspecified: Secondary | ICD-10-CM | POA: Diagnosis not present

## 2023-09-24 DIAGNOSIS — Z9181 History of falling: Secondary | ICD-10-CM | POA: Diagnosis not present

## 2023-09-24 DIAGNOSIS — D630 Anemia in neoplastic disease: Secondary | ICD-10-CM | POA: Diagnosis not present

## 2023-09-24 DIAGNOSIS — E039 Hypothyroidism, unspecified: Secondary | ICD-10-CM | POA: Diagnosis not present

## 2023-09-24 DIAGNOSIS — D631 Anemia in chronic kidney disease: Secondary | ICD-10-CM | POA: Diagnosis not present

## 2023-09-24 DIAGNOSIS — I5023 Acute on chronic systolic (congestive) heart failure: Secondary | ICD-10-CM | POA: Diagnosis not present

## 2023-09-24 DIAGNOSIS — I080 Rheumatic disorders of both mitral and aortic valves: Secondary | ICD-10-CM | POA: Diagnosis not present

## 2023-09-24 DIAGNOSIS — N1831 Chronic kidney disease, stage 3a: Secondary | ICD-10-CM | POA: Diagnosis not present

## 2023-09-24 DIAGNOSIS — Z7982 Long term (current) use of aspirin: Secondary | ICD-10-CM | POA: Diagnosis not present

## 2023-09-24 DIAGNOSIS — Z955 Presence of coronary angioplasty implant and graft: Secondary | ICD-10-CM | POA: Diagnosis not present

## 2023-09-24 DIAGNOSIS — I2510 Atherosclerotic heart disease of native coronary artery without angina pectoris: Secondary | ICD-10-CM | POA: Diagnosis not present

## 2023-09-24 DIAGNOSIS — I4719 Other supraventricular tachycardia: Secondary | ICD-10-CM | POA: Diagnosis not present

## 2023-09-24 DIAGNOSIS — M810 Age-related osteoporosis without current pathological fracture: Secondary | ICD-10-CM | POA: Diagnosis not present

## 2023-09-24 DIAGNOSIS — E785 Hyperlipidemia, unspecified: Secondary | ICD-10-CM | POA: Diagnosis not present

## 2023-09-24 DIAGNOSIS — D352 Benign neoplasm of pituitary gland: Secondary | ICD-10-CM | POA: Diagnosis not present

## 2023-09-25 ENCOUNTER — Emergency Department (HOSPITAL_COMMUNITY)
Admission: EM | Admit: 2023-09-25 | Discharge: 2023-09-25 | Disposition: A | Discharge status: Home / Self Care | Attending: Emergency Medicine | Admitting: Emergency Medicine

## 2023-09-25 ENCOUNTER — Encounter (HOSPITAL_COMMUNITY): Payer: Self-pay

## 2023-09-25 ENCOUNTER — Emergency Department (HOSPITAL_COMMUNITY)

## 2023-09-25 ENCOUNTER — Other Ambulatory Visit: Payer: Self-pay

## 2023-09-25 DIAGNOSIS — I509 Heart failure, unspecified: Secondary | ICD-10-CM | POA: Insufficient documentation

## 2023-09-25 DIAGNOSIS — N189 Chronic kidney disease, unspecified: Secondary | ICD-10-CM | POA: Insufficient documentation

## 2023-09-25 DIAGNOSIS — I517 Cardiomegaly: Secondary | ICD-10-CM | POA: Diagnosis not present

## 2023-09-25 DIAGNOSIS — I2510 Atherosclerotic heart disease of native coronary artery without angina pectoris: Secondary | ICD-10-CM | POA: Diagnosis not present

## 2023-09-25 DIAGNOSIS — Z79890 Hormone replacement therapy (postmenopausal): Secondary | ICD-10-CM | POA: Insufficient documentation

## 2023-09-25 DIAGNOSIS — Z7901 Long term (current) use of anticoagulants: Secondary | ICD-10-CM | POA: Diagnosis not present

## 2023-09-25 DIAGNOSIS — I447 Left bundle-branch block, unspecified: Secondary | ICD-10-CM | POA: Diagnosis not present

## 2023-09-25 DIAGNOSIS — I110 Hypertensive heart disease with heart failure: Secondary | ICD-10-CM | POA: Diagnosis not present

## 2023-09-25 DIAGNOSIS — E039 Hypothyroidism, unspecified: Secondary | ICD-10-CM | POA: Diagnosis not present

## 2023-09-25 DIAGNOSIS — R0609 Other forms of dyspnea: Secondary | ICD-10-CM | POA: Diagnosis not present

## 2023-09-25 DIAGNOSIS — I959 Hypotension, unspecified: Secondary | ICD-10-CM | POA: Diagnosis not present

## 2023-09-25 DIAGNOSIS — R0602 Shortness of breath: Secondary | ICD-10-CM | POA: Diagnosis not present

## 2023-09-25 DIAGNOSIS — J90 Pleural effusion, not elsewhere classified: Secondary | ICD-10-CM | POA: Diagnosis not present

## 2023-09-25 DIAGNOSIS — J9811 Atelectasis: Secondary | ICD-10-CM | POA: Diagnosis not present

## 2023-09-25 LAB — CBC WITH DIFFERENTIAL/PLATELET
Abs Immature Granulocytes: 0.01 10*3/uL (ref 0.00–0.07)
Basophils Absolute: 0 10*3/uL (ref 0.0–0.1)
Basophils Relative: 1 %
Eosinophils Absolute: 0.1 10*3/uL (ref 0.0–0.5)
Eosinophils Relative: 1 %
HCT: 41.3 % (ref 36.0–46.0)
Hemoglobin: 13 g/dL (ref 12.0–15.0)
Immature Granulocytes: 0 %
Lymphocytes Relative: 34 %
Lymphs Abs: 2.8 10*3/uL (ref 0.7–4.0)
MCH: 32 pg (ref 26.0–34.0)
MCHC: 31.5 g/dL (ref 30.0–36.0)
MCV: 101.7 fL — ABNORMAL HIGH (ref 80.0–100.0)
Monocytes Absolute: 0.5 10*3/uL (ref 0.1–1.0)
Monocytes Relative: 6 %
Neutro Abs: 4.8 10*3/uL (ref 1.7–7.7)
Neutrophils Relative %: 58 %
Platelets: 293 10*3/uL (ref 150–400)
RBC: 4.06 MIL/uL (ref 3.87–5.11)
RDW: 15.4 % (ref 11.5–15.5)
WBC: 8.2 10*3/uL (ref 4.0–10.5)
nRBC: 0 % (ref 0.0–0.2)

## 2023-09-25 LAB — MAGNESIUM: Magnesium: 2.8 mg/dL — ABNORMAL HIGH (ref 1.7–2.4)

## 2023-09-25 LAB — COMPREHENSIVE METABOLIC PANEL WITH GFR
ALT: 23 U/L (ref 0–44)
AST: 21 U/L (ref 15–41)
Albumin: 3.5 g/dL (ref 3.5–5.0)
Alkaline Phosphatase: 54 U/L (ref 38–126)
Anion gap: 9 (ref 5–15)
BUN: 22 mg/dL (ref 8–23)
CO2: 24 mmol/L (ref 22–32)
Calcium: 9.1 mg/dL (ref 8.9–10.3)
Chloride: 107 mmol/L (ref 98–111)
Creatinine, Ser: 1.1 mg/dL — ABNORMAL HIGH (ref 0.44–1.00)
GFR, Estimated: 49 mL/min — ABNORMAL LOW (ref >60)
Glucose, Bld: 88 mg/dL (ref 70–99)
Potassium: 4.5 mmol/L (ref 3.5–5.1)
Sodium: 140 mmol/L (ref 135–145)
Total Bilirubin: 0.4 mg/dL (ref 0.0–1.2)
Total Protein: 7.5 g/dL (ref 6.5–8.1)

## 2023-09-25 LAB — RESP PANEL BY RT-PCR (RSV, FLU A&B, COVID)  RVPGX2
Influenza A by PCR: NEGATIVE
Influenza B by PCR: NEGATIVE
Resp Syncytial Virus by PCR: NEGATIVE
SARS Coronavirus 2 by RT PCR: NEGATIVE

## 2023-09-25 LAB — BRAIN NATRIURETIC PEPTIDE: B Natriuretic Peptide: 2302.9 pg/mL — ABNORMAL HIGH (ref 0.0–100.0)

## 2023-09-25 MED ORDER — FUROSEMIDE 20 MG PO TABS: 20.0000 mg | ORAL_TABLET | Freq: Every day | ORAL | 0 refills | Status: DC

## 2023-09-25 MED ORDER — FUROSEMIDE 10 MG/ML IJ SOLN
20.0000 mg | Freq: Once | INTRAMUSCULAR | Status: AC
  Administered 2023-09-25: 20 mg via INTRAVENOUS
  Filled 2023-09-25: qty 4

## 2023-09-25 NOTE — ED Triage Notes (Signed)
 Pt has congestive heart failure. On lasix  and is still having SOB. Wants to have lasix  adjusted so she isn't so SOB. Lives at home by herself. Pt is waiting for Hospice care. She did take an extra lasix  today per her PCP. Her PCP sent her to ED. Hx Bundle branch block and 25% Ejection Fraction. Pt needs further education on nitroglycerin  she took 6-8 for SOB last night. Pt denied any pain at the time she took them.

## 2023-09-25 NOTE — Discharge Instructions (Addendum)
 Your symptoms today likely secondary to congestive heart failure.  Recommend increasing your Lasix  to 20 mg daily as was prescribed today.  Please follow-up with hospice coordinator and PCP next week.  It was a pleasure caring for you today in the emergency department.  Please return to the emergency department for any worsening or worrisome symptoms.

## 2023-09-25 NOTE — ED Provider Notes (Signed)
 [facility] EMERGENCY DEPARTMENT AT [facility] Provider Note   CSN: [id number] Arrival date & time: 09/25/23  1347     History  Chief Complaint  Patient presents with   Shortness of Breath    Susan [name] is a 89 y.o. female.   Shortness of Breath Patient presents for shortness of breath.  Medical history includes CHF, CAD, CKD, atrial fibrillation, depression, anemia, hypothyroidism.  She had a recent heart cath which showed 75% LAD stenosis.  This has been medically managed.  She is on Eliquis .  She was admitted 2 weeks ago for CHF exacerbation.  She underwent IV diuresis.  She had pleural effusions but no thoracentesis was required.  She was discharged on midodrine  and Lasix .  Due to her Szo blood pressures, she only takes Lasix  twice per week.  This is typically on Tuesdays and Fridays.  Since her discharge home 10 days ago, she has had ongoing shortness of breath.  She currently lives alone and is waiting for initiation of hospice care.  PCP advised extra dose of Lasix  today and ED evaluation.  Currently, she is not short of breath at rest.  She has home health coming out twice per week.  She was able to ambulate slowly with a walker several times around the room earlier this week.  At times, however, she gets severely short of breath with even sitting up and reaching for something.  Last night, she took multiple doses of nitroglycerin  when she experienced the symptoms.  This does seem to help.  She has not had any recent chest pain.     Home Medications Prior to Admission medications   Medication Sig Start Date End Date Taking? Authorizing Provider  acetaminophen  (TYLENOL ) 500 MG tablet Take 2 tablets (1,000 mg total) by mouth every 8 (eight) hours as needed. 09/15/23   Justina Oman, MD  apixaban  (ELIQUIS ) 2.5 MG TABS tablet Take 1 tablet (2.5 mg total) by mouth 2 (two) times daily. 09/09/23   Williams, Evan, PA-C  Calcium  Carbonate-Vitamin D  (CALCIUM  + D PO) Take 1  tablet by mouth 2 (two) times daily.    [provider]  cholecalciferol  (VITAMIN D3) 25 MCG (1000 UT) tablet Take 1,000 Units by mouth daily after breakfast.    [provider]  dapagliflozin  propanediol (FARXIGA ) 10 MG TABS tablet Take 1 tablet (10 mg total) by mouth daily. 09/09/23   Williams, Evan, PA-C  denosumab  (PROLIA ) 60 MG/ML SOSY injection Inject 60 mg into the skin every 6 (six) months.    [provider]  furosemide  (LASIX ) 40 MG tablet Take 1 tablet by mouth twice a week (Tuesday and Friday) 09/16/23   Justina Oman, MD  levothyroxine  (SYNTHROID , LEVOTHROID) 75 MCG tablet Take 37.5-75 mcg by mouth See admin instructions. Take 1 tablet by mouth daily, except on Sunday, take 1/2 tablet 11/30/17   [provider]  metoprolol  succinate (TOPROL -XL) 25 MG 24 hr tablet Take 0.5 tablets (12.5 mg total) by mouth daily. 09/09/23   Williams, Evan, PA-C  midodrine  (PROAMATINE ) 5 MG tablet Take 1 tablet (5 mg total) by mouth 3 (three) times daily with meals. 09/15/23   Justina Oman, MD  multivitamin-lutein [facility]) CAPS capsule Take 1 capsule by mouth 2 (two) times a day.     [provider]  nitroGLYCERIN  (NITROSTAT ) 0.4 MG SL tablet Place 1 tablet (0.4 mg total) under the tongue every 5 (five) minutes as needed. Patient taking differently: Place 0.4 mg under the tongue every 5 (  five) minutes as needed for chest pain. 06/22/11   Nahser, Lela Purple, MD  ranolazine  (RANEXA ) 500 MG 12 hr tablet Take 1 tablet (500 mg total) by mouth 2 (two) times daily. 09/01/23   Gonfa, Taye T, MD  rosuvastatin  (CRESTOR ) 40 MG tablet Take 1 tablet (40 mg total) by mouth daily. 01/18/23   Nahser, Lela Purple, MD  venlafaxine  XR (EFFEXOR -XR) 150 MG 24 hr capsule Take 1 capsule (150 mg total) by mouth daily with breakfast. 02/19/23   Nahser, Lela Purple, MD  vitamin B-12 (CYANOCOBALAMIN ) 1000 MCG tablet Take 2,000 mcg by mouth daily.     [provider]      Allergies     Patient has no known allergies.    Review of Systems   Review of Systems  Constitutional:  Positive for fatigue.  Respiratory:  Positive for shortness of breath.   All other systems reviewed and are negative.   Physical Exam Updated Vital Signs BP 96/74 (BP Location: Left Arm)   Pulse 72   Temp (!) 97.5 F (36.4 C) (Oral)   Resp (!) 23   Ht 5\' 3"  (1.6 m)   Wt 57.4 kg   SpO2 94%   BMI 22.43 kg/m  Physical Exam Vitals and nursing note reviewed.  Constitutional:      General: She is not in acute distress.    Appearance: She is well-developed. She is not ill-appearing, toxic-appearing or diaphoretic.  HENT:     Head: Normocephalic and atraumatic.     Mouth/Throat:     Mouth: Mucous membranes are moist.  Eyes:     Conjunctiva/sclera: Conjunctivae normal.  Cardiovascular:     Rate and Rhythm: Normal rate and regular rhythm.     Heart sounds: No murmur heard. Pulmonary:     Effort: Pulmonary effort is normal. No respiratory distress.     Breath sounds: Rales present. No wheezing or rhonchi.  Chest:     Chest wall: No tenderness.  Abdominal:     Palpations: Abdomen is soft.     Tenderness: There is no abdominal tenderness.  Musculoskeletal:        General: No swelling. Normal range of motion.     Cervical back: Normal range of motion and neck supple.     Right lower leg: No edema.     Left lower leg: No edema.  Skin:    General: Skin is warm and dry.     Coloration: Skin is not cyanotic or pale.  Neurological:     General: No focal deficit present.     Mental Status: She is alert and oriented to person, place, and time.  Psychiatric:        Mood and Affect: Mood normal.        Behavior: Behavior normal.     ED Results / Procedures / Treatments   Labs (all labs ordered are listed, but only abnormal results are displayed) Labs Reviewed  RESP PANEL BY RT-PCR (RSV, FLU A&B, COVID)  [id number]  COMPREHENSIVE METABOLIC PANEL WITH GFR  BRAIN NATRIURETIC PEPTIDE  CBC  WITH DIFFERENTIAL/PLATELET  MAGNESIUM     EKG None  Radiology No results found.  Procedures Procedures    Medications Ordered in ED Medications - No data to display  ED Course/ Medical Decision Making/ A&P                                 Medical Decision Making  This patient presents to the ED for concern of shortness of breath, this involves an extensive number of treatment options, and is a complaint that carries with it a high risk of complications and morbidity.  The differential diagnosis includes CHF, pneumonia, reactive airway disease, anemia, acidosis   Co morbidities that complicate the patient evaluation  CHF, CAD, CKD, atrial fibrillation, depression, anemia, hypothyroidism   Additional history obtained:  Additional history obtained from patient's daughter External records from outside source obtained and reviewed including EMR   Lab Tests:  I Ordered, and personally interpreted labs.  The pertinent results include:  (pending at time of signout)   Imaging Studies ordered:  I ordered imaging studies including chest x-ray I independently visualized and interpreted imaging which showed (pending at time of signout) I agree with the radiologist interpretation  Problem List / ED Course / Critical interventions / Medication management  Patient presenting for shortness of breath, fatigue, exercise intolerance.  She has known CHF and had Szo blood pressures at baseline.  She is on midodrine  and twice per week Lasix  currently.  She had a recent admission to the hospital in was discharged 10 days ago.  She lives alone and has 2 times per week home health care coming by.  Today, patient states that she knows she is dying from her CHF.  She was advised by her PCP to come in for recheck of lab work and imaging studies.  These were ordered.  Currently, at rest, her breathing is unlabored and she is able to speak in complete sentences.  SpO2 is normal on room air.   Patient and daughter, who accompanies her at bedside, state that she would like to be on hospice.  She is not interested in a facility but they would like more frequent care and assistance at home.  I spoke with Lestine Rathke with hospice care who will come and talk to the patient and daughter.  At time of signout, workup was pending.  Care of patient was signed out to oncoming ED provider.   Social Determinants of Health:  Frequent hospitalizations        Final Clinical Impression(s) / ED Diagnoses Final diagnoses:  Exertional dyspnea    Rx / DC Orders ED Discharge Orders     None         Iva Mariner, MD 09/25/23 [phone]

## 2023-09-25 NOTE — ED Provider Notes (Signed)
  Provider Note MRN:  [id number]  Arrival date & time: 09/25/23    ED Course and Medical Decision Making  Assumed care from Dr Kermit Ped at shift change.  See note from prior team for complete details, in brief:  Clinical Course as of 09/25/23 1936  Sat Sep 25, 2023  [address] 89 yo/f Hx CHF Hospice eval pending Possible hospice admit [SG]  1756 Spoke with [facility] nurse, pt could be placed to hospice as early as Monday/Tuesday of next week. [SG]  1912 She is feeling better, is eager for discharge, will increase lasix  to 20mg  bid for the next 5 days. She will f/u with pcp next week and with hospice early next week re: placement  [SG]    Clinical Course User Index [SG] Teddi Favors, DO   Feels her breathing has improved.  No hypoxia.  Patient does not want to stay in the hospital, she is eager for discharge.  I feel this is reasonable given the chronicity of her condition and desire for initiation of hospice.  Likely acute on chronic CHF, she has very advanced CHF.  Follows with Dr. Alroy Aspen.  Will increase her Lasix  over the next 5 days.  She is considering starting hospice next week.  Also follow-up with her PCP next week.    Patient in no distress and overall condition improved here in the ED. Detailed discussions were had with the patient/guardian regarding current findings, and need for close f/u with PCP or on call doctor. The patient/guardian has been instructed to return immediately if the symptoms worsen in any way for re-evaluation. Patient/guardian verbalized understanding and is in agreement with current care plan. All questions answered prior to discharge.   .Critical Care  Performed by: Teddi Favors, DO Authorized by: Teddi Favors, DO   Critical care provider statement:    Critical care time (minutes):  30   Critical care time was exclusive of:  Separately billable procedures and treating other patients   Critical care was necessary to treat or prevent imminent  or life-threatening deterioration of the following conditions:  Cardiac failure   Critical care was time spent personally by me on the following activities:  Development of treatment plan with patient or surrogate, discussions with consultants, evaluation of patient's response to treatment, examination of patient, ordering and review of laboratory studies, ordering and review of radiographic studies, ordering and performing treatments and interventions, pulse oximetry, re-evaluation of patient's condition, review of old charts and obtaining history from patient or surrogate   Final Clinical Impressions(s) / ED Diagnoses     ICD-10-CM   1. Exertional dyspnea  R06.09     2. Congestive heart failure, unspecified HF chronicity, unspecified heart failure type (HCC)  I50.9       ED Discharge Orders          Ordered    furosemide  (LASIX ) 20 MG tablet  Daily        09/25/23 1903            Discharge Instructions      Your symptoms today likely secondary to congestive heart failure.  Recommend increasing your Lasix  to 20 mg daily as was prescribed today.  Please follow-up with hospice coordinator and PCP next week.  It was a pleasure caring for you today in the emergency department.  Please return to the emergency department for any worsening or worrisome symptoms.       Teddi Favors, DO 09/25/23 [phone]

## 2023-09-25 NOTE — Progress Notes (Signed)
 [facility] Liaison Note  Received request from after hours that family friend Susan [name] had phoned in asking for hospice. Notified patient was in ED.  Presented to room to meet patient and daughter, Susan [name], at bedside. Discussed hospice philosophy, services and team approach to care. Daughter and patient appreciative of visit and would like to hear results of workup before any decisions are made. Informed that I would follow along in chart and touch base tomorrow whether admitted or at home to answer any additional questions or make referral for home hospice if this is the direction patient and family wish to proceed.  Discussed with Dr. Martina Sledge.  Please call with any hospice related questions or concerns.  Thank you, Lestine Rathke, BSN, [facility] [phone]

## 2023-09-28 DIAGNOSIS — D352 Benign neoplasm of pituitary gland: Secondary | ICD-10-CM | POA: Diagnosis not present

## 2023-09-28 DIAGNOSIS — D630 Anemia in neoplastic disease: Secondary | ICD-10-CM | POA: Diagnosis not present

## 2023-09-28 DIAGNOSIS — E785 Hyperlipidemia, unspecified: Secondary | ICD-10-CM | POA: Diagnosis not present

## 2023-09-28 DIAGNOSIS — F32A Depression, unspecified: Secondary | ICD-10-CM | POA: Diagnosis not present

## 2023-09-28 DIAGNOSIS — N1831 Chronic kidney disease, stage 3a: Secondary | ICD-10-CM | POA: Diagnosis not present

## 2023-09-28 DIAGNOSIS — I5023 Acute on chronic systolic (congestive) heart failure: Secondary | ICD-10-CM | POA: Diagnosis not present

## 2023-09-28 DIAGNOSIS — Z955 Presence of coronary angioplasty implant and graft: Secondary | ICD-10-CM | POA: Diagnosis not present

## 2023-09-28 DIAGNOSIS — I080 Rheumatic disorders of both mitral and aortic valves: Secondary | ICD-10-CM | POA: Diagnosis not present

## 2023-09-28 DIAGNOSIS — Z7901 Long term (current) use of anticoagulants: Secondary | ICD-10-CM | POA: Diagnosis not present

## 2023-09-28 DIAGNOSIS — I4719 Other supraventricular tachycardia: Secondary | ICD-10-CM | POA: Diagnosis not present

## 2023-09-28 DIAGNOSIS — I447 Left bundle-branch block, unspecified: Secondary | ICD-10-CM | POA: Diagnosis not present

## 2023-09-28 DIAGNOSIS — E039 Hypothyroidism, unspecified: Secondary | ICD-10-CM | POA: Diagnosis not present

## 2023-09-28 DIAGNOSIS — I480 Paroxysmal atrial fibrillation: Secondary | ICD-10-CM | POA: Diagnosis not present

## 2023-09-28 DIAGNOSIS — J9811 Atelectasis: Secondary | ICD-10-CM | POA: Diagnosis not present

## 2023-09-28 DIAGNOSIS — M810 Age-related osteoporosis without current pathological fracture: Secondary | ICD-10-CM | POA: Diagnosis not present

## 2023-09-28 DIAGNOSIS — R918 Other nonspecific abnormal finding of lung field: Secondary | ICD-10-CM | POA: Diagnosis not present

## 2023-09-28 DIAGNOSIS — Z48812 Encounter for surgical aftercare following surgery on the circulatory system: Secondary | ICD-10-CM | POA: Diagnosis not present

## 2023-09-28 DIAGNOSIS — I493 Ventricular premature depolarization: Secondary | ICD-10-CM | POA: Diagnosis not present

## 2023-09-28 DIAGNOSIS — I2510 Atherosclerotic heart disease of native coronary artery without angina pectoris: Secondary | ICD-10-CM | POA: Diagnosis not present

## 2023-09-28 DIAGNOSIS — Z9181 History of falling: Secondary | ICD-10-CM | POA: Diagnosis not present

## 2023-09-28 DIAGNOSIS — Z7984 Long term (current) use of oral hypoglycemic drugs: Secondary | ICD-10-CM | POA: Diagnosis not present

## 2023-09-28 DIAGNOSIS — D631 Anemia in chronic kidney disease: Secondary | ICD-10-CM | POA: Diagnosis not present

## 2023-09-28 DIAGNOSIS — I700 Atherosclerosis of aorta: Secondary | ICD-10-CM | POA: Diagnosis not present

## 2023-09-28 DIAGNOSIS — Z7982 Long term (current) use of aspirin: Secondary | ICD-10-CM | POA: Diagnosis not present

## 2023-09-30 DIAGNOSIS — E785 Hyperlipidemia, unspecified: Secondary | ICD-10-CM | POA: Diagnosis not present

## 2023-09-30 DIAGNOSIS — J9811 Atelectasis: Secondary | ICD-10-CM | POA: Diagnosis not present

## 2023-09-30 DIAGNOSIS — D352 Benign neoplasm of pituitary gland: Secondary | ICD-10-CM | POA: Diagnosis not present

## 2023-09-30 DIAGNOSIS — Z7901 Long term (current) use of anticoagulants: Secondary | ICD-10-CM | POA: Diagnosis not present

## 2023-09-30 DIAGNOSIS — I480 Paroxysmal atrial fibrillation: Secondary | ICD-10-CM | POA: Diagnosis not present

## 2023-09-30 DIAGNOSIS — Z9181 History of falling: Secondary | ICD-10-CM | POA: Diagnosis not present

## 2023-09-30 DIAGNOSIS — R918 Other nonspecific abnormal finding of lung field: Secondary | ICD-10-CM | POA: Diagnosis not present

## 2023-09-30 DIAGNOSIS — Z955 Presence of coronary angioplasty implant and graft: Secondary | ICD-10-CM | POA: Diagnosis not present

## 2023-09-30 DIAGNOSIS — Z7984 Long term (current) use of oral hypoglycemic drugs: Secondary | ICD-10-CM | POA: Diagnosis not present

## 2023-09-30 DIAGNOSIS — D631 Anemia in chronic kidney disease: Secondary | ICD-10-CM | POA: Diagnosis not present

## 2023-09-30 DIAGNOSIS — Z7982 Long term (current) use of aspirin: Secondary | ICD-10-CM | POA: Diagnosis not present

## 2023-09-30 DIAGNOSIS — I700 Atherosclerosis of aorta: Secondary | ICD-10-CM | POA: Diagnosis not present

## 2023-09-30 DIAGNOSIS — F32A Depression, unspecified: Secondary | ICD-10-CM | POA: Diagnosis not present

## 2023-09-30 DIAGNOSIS — I5023 Acute on chronic systolic (congestive) heart failure: Secondary | ICD-10-CM | POA: Diagnosis not present

## 2023-09-30 DIAGNOSIS — D630 Anemia in neoplastic disease: Secondary | ICD-10-CM | POA: Diagnosis not present

## 2023-09-30 DIAGNOSIS — I080 Rheumatic disorders of both mitral and aortic valves: Secondary | ICD-10-CM | POA: Diagnosis not present

## 2023-09-30 DIAGNOSIS — E039 Hypothyroidism, unspecified: Secondary | ICD-10-CM | POA: Diagnosis not present

## 2023-09-30 DIAGNOSIS — I447 Left bundle-branch block, unspecified: Secondary | ICD-10-CM | POA: Diagnosis not present

## 2023-09-30 DIAGNOSIS — I4719 Other supraventricular tachycardia: Secondary | ICD-10-CM | POA: Diagnosis not present

## 2023-09-30 DIAGNOSIS — M810 Age-related osteoporosis without current pathological fracture: Secondary | ICD-10-CM | POA: Diagnosis not present

## 2023-09-30 DIAGNOSIS — Z48812 Encounter for surgical aftercare following surgery on the circulatory system: Secondary | ICD-10-CM | POA: Diagnosis not present

## 2023-09-30 DIAGNOSIS — I2510 Atherosclerotic heart disease of native coronary artery without angina pectoris: Secondary | ICD-10-CM | POA: Diagnosis not present

## 2023-09-30 DIAGNOSIS — N1831 Chronic kidney disease, stage 3a: Secondary | ICD-10-CM | POA: Diagnosis not present

## 2023-09-30 DIAGNOSIS — I493 Ventricular premature depolarization: Secondary | ICD-10-CM | POA: Diagnosis not present

## 2023-10-01 DIAGNOSIS — Z7984 Long term (current) use of oral hypoglycemic drugs: Secondary | ICD-10-CM | POA: Diagnosis not present

## 2023-10-01 DIAGNOSIS — I5023 Acute on chronic systolic (congestive) heart failure: Secondary | ICD-10-CM | POA: Diagnosis not present

## 2023-10-01 DIAGNOSIS — R918 Other nonspecific abnormal finding of lung field: Secondary | ICD-10-CM | POA: Diagnosis not present

## 2023-10-01 DIAGNOSIS — I080 Rheumatic disorders of both mitral and aortic valves: Secondary | ICD-10-CM | POA: Diagnosis not present

## 2023-10-01 DIAGNOSIS — I447 Left bundle-branch block, unspecified: Secondary | ICD-10-CM | POA: Diagnosis not present

## 2023-10-01 DIAGNOSIS — E785 Hyperlipidemia, unspecified: Secondary | ICD-10-CM | POA: Diagnosis not present

## 2023-10-01 DIAGNOSIS — Z7982 Long term (current) use of aspirin: Secondary | ICD-10-CM | POA: Diagnosis not present

## 2023-10-01 DIAGNOSIS — I480 Paroxysmal atrial fibrillation: Secondary | ICD-10-CM | POA: Diagnosis not present

## 2023-10-01 DIAGNOSIS — E039 Hypothyroidism, unspecified: Secondary | ICD-10-CM | POA: Diagnosis not present

## 2023-10-01 DIAGNOSIS — I4719 Other supraventricular tachycardia: Secondary | ICD-10-CM | POA: Diagnosis not present

## 2023-10-01 DIAGNOSIS — F32A Depression, unspecified: Secondary | ICD-10-CM | POA: Diagnosis not present

## 2023-10-01 DIAGNOSIS — D352 Benign neoplasm of pituitary gland: Secondary | ICD-10-CM | POA: Diagnosis not present

## 2023-10-01 DIAGNOSIS — Z9181 History of falling: Secondary | ICD-10-CM | POA: Diagnosis not present

## 2023-10-01 DIAGNOSIS — Z955 Presence of coronary angioplasty implant and graft: Secondary | ICD-10-CM | POA: Diagnosis not present

## 2023-10-01 DIAGNOSIS — D630 Anemia in neoplastic disease: Secondary | ICD-10-CM | POA: Diagnosis not present

## 2023-10-01 DIAGNOSIS — Z7901 Long term (current) use of anticoagulants: Secondary | ICD-10-CM | POA: Diagnosis not present

## 2023-10-01 DIAGNOSIS — D631 Anemia in chronic kidney disease: Secondary | ICD-10-CM | POA: Diagnosis not present

## 2023-10-01 DIAGNOSIS — I2510 Atherosclerotic heart disease of native coronary artery without angina pectoris: Secondary | ICD-10-CM | POA: Diagnosis not present

## 2023-10-01 DIAGNOSIS — Z48812 Encounter for surgical aftercare following surgery on the circulatory system: Secondary | ICD-10-CM | POA: Diagnosis not present

## 2023-10-01 DIAGNOSIS — N1831 Chronic kidney disease, stage 3a: Secondary | ICD-10-CM | POA: Diagnosis not present

## 2023-10-01 DIAGNOSIS — I700 Atherosclerosis of aorta: Secondary | ICD-10-CM | POA: Diagnosis not present

## 2023-10-01 DIAGNOSIS — J9811 Atelectasis: Secondary | ICD-10-CM | POA: Diagnosis not present

## 2023-10-01 DIAGNOSIS — I493 Ventricular premature depolarization: Secondary | ICD-10-CM | POA: Diagnosis not present

## 2023-10-01 DIAGNOSIS — M810 Age-related osteoporosis without current pathological fracture: Secondary | ICD-10-CM | POA: Diagnosis not present

## 2023-10-04 ENCOUNTER — Other Ambulatory Visit: Payer: Self-pay | Admitting: Internal Medicine

## 2023-10-04 DIAGNOSIS — M810 Age-related osteoporosis without current pathological fracture: Secondary | ICD-10-CM

## 2023-10-04 DIAGNOSIS — E039 Hypothyroidism, unspecified: Secondary | ICD-10-CM

## 2023-10-04 DIAGNOSIS — D6869 Other thrombophilia: Secondary | ICD-10-CM | POA: Diagnosis not present

## 2023-10-04 DIAGNOSIS — K219 Gastro-esophageal reflux disease without esophagitis: Secondary | ICD-10-CM | POA: Diagnosis not present

## 2023-10-05 ENCOUNTER — Ambulatory Visit: Attending: Physician Assistant | Admitting: Physician Assistant

## 2023-10-05 ENCOUNTER — Encounter: Payer: Self-pay | Admitting: Physician Assistant

## 2023-10-05 VITALS — BP 82/60 | HR 49 | Ht 63.0 in | Wt 125.8 lb

## 2023-10-05 DIAGNOSIS — I5042 Chronic combined systolic (congestive) and diastolic (congestive) heart failure: Secondary | ICD-10-CM | POA: Diagnosis not present

## 2023-10-05 DIAGNOSIS — I959 Hypotension, unspecified: Secondary | ICD-10-CM | POA: Diagnosis not present

## 2023-10-05 DIAGNOSIS — E785 Hyperlipidemia, unspecified: Secondary | ICD-10-CM | POA: Diagnosis not present

## 2023-10-05 DIAGNOSIS — I2510 Atherosclerotic heart disease of native coronary artery without angina pectoris: Secondary | ICD-10-CM

## 2023-10-05 MED ORDER — MIDODRINE HCL 10 MG PO TABS: 10.0000 mg | ORAL_TABLET | Freq: Three times a day (TID) | ORAL | 1 refills | Status: AC

## 2023-10-05 NOTE — Patient Instructions (Signed)
 Medication Instructions:  INCREASE MIDODRINE  TO 10 MG THREE TIMES A DAY *If you need a refill on your cardiac medications before your next appointment, please call your pharmacy*  Lab Work: NO LABS If you have labs (blood work) drawn today and your tests are completely normal, you will receive your results only by: MyChart Message (if you have MyChart) OR A paper copy in the mail If you have any lab test that is abnormal or we need to change your treatment, we will call you to review the results.  Testing/Procedures: NO TESTING  Follow-Up: At [facility], you and your health needs are our priority.  As part of our continuing mission to provide you with exceptional heart care, our providers are all part of one team.  This team includes your primary Cardiologist (physician) and Advanced Practice Providers or APPs (Physician Assistants and Nurse Practitioners) who all work together to provide you with the care you need, when you need it.  Your next appointment:   3-4 week(s)  Provider:   Ervin Heath, PA-C      Then, Antoinette Batman, MD will plan to see you again in 3-4 month(s).

## 2023-10-05 NOTE — Progress Notes (Unsigned)
 Cardiology Office Note   Date:  10/05/2023  ID:  Susan, [name] [birth year], MRN [id number] PCP: Aldo Hun, MD  [facility] Providers Cardiologist:  Ahmad Alert, MD { Click to update primary MD,subspecialty MD or APP then REFRESH:1}    History of Present Illness Susan [name] is a 89 y.o. female with past medical history of CAD, LBBB, hypothyroidism and hyperlipidemia.  Patient underwent PTCA and stenting of her RCA in 2005 with a Taxus 2.75 x 16 mm stent.  She underwent repeat cardiac catheterization in 2019 that showed a moderate to severe mid LAD lesion 60 to 70%, severely reduced LV function.  Patient was started on carvedilol .  Repeat echocardiogram in July 2020 showed her EF has improved from 30 to 35% to 40 to 45%.  Patient was admitted to: In July 2021 near syncope and symptoms consistent with orthostatic hypotension.  She also fractured her clavicle.  She had poor oral intake.  Carvedilol  was cut back due to orthostasis.  Echocardiogram in November 2021 showed EF 35 to 40%, grade 2 DD.  Repeat echocardiogram in June 2023 showed EF 30 to 35%.  Heart monitor ordered in March 2024 due to presyncope showed normal sinus rhythm with 37 episode of nonsustained VT, fastest episode lasted for 10.1 seconds with average heart rate of 215.  Her GDMT has been limited due to hypotension.  Recent Myoview  obtained on 07/30/2023 showed no ischemia, the study was high risk was a small defect of moderate reduction in uptake present in the apical anteroseptal location that is fixed, EF 29%.  The fixed defect was felt to be related to underlying left bundle branch block.  Echocardiogram obtained in April 2025 showed EF of 25 to 30%, moderate MR, moderate AI.  She was evaluated by EP and deemed not a candidate for resynchronization therapy.  She was last seen by Dr. Alroy Aspen on 08/17/2023 at which time she complained of occasional pounding sensation in the chest, a 7-day heart monitor was placed.   Heart monitor showed 2 episode of nonsustained VT, longest lasted 7 beats, neither 1 was symptomatic.  She did have 21 episode of nonsustained SVT.  She was started on metoprolol  succinate 12.5 mg daily. Patient was readmitted to the hospital near the end of April due to chest discomfort.  Echocardiogram obtained on 09/09/2023 showed EF 25 to 30%, global hypokinesis, mild mitral annular calcification.  Subsequent cardiac catheterization showed EF 35 to 40%, moderate severe mid LAD lesion unchanged when compared to the previous cardiac catheterization, patent left main, left circumflex artery and RCA with patent RCA stents.  Medical therapy was recommended.  Since discharge, patient was readmitted in May 2025 with acute CHF exacerbation.  CTA showed no PE but moderate to large bilateral pleural effusion and a mild to moderate severity bilateral upper lobe and bilateral lower lobe dependent atelectasis.  See me improved with IV Lasix .  Thoracentesis was attempted however there was insufficient volume of pleural fluid, therefore the procedure was aborted.  She was ultimately discharged on midodrine  5 mg 3 times a day and Lasix  40 mg twice weekly. After her discharge, she returned back to the hospital again on 09/25/2023 with shortness of breath.  Her creatinine was 1.1.  BNP 2302.  She was placed on 20 mg daily of Lasix .  Patient presents today for follow-up.  Blood pressure has been low at home in the 80s to 90s, patient is asymptomatic and denies any dizziness or feeling of passing out.  On manual blood pressure check, her blood pressure was 84/56.  Although initial heart rate was documented as 49 bpm, however on physical exam, patient's heart rate is clearly higher than 49 bpm.  Given low blood pressure, I will increase her midodrine  to 10 mg 3 times a day.  Her primary cardiologist is retiring, she has been seen by Dr. Abel Hoe during the recent hospitalization, I will set her up to follow-up with Dr. Abel Hoe in 3  to 4 months.  Given her frailty, I will try to see her back myself in 3 to 4 weeks for reassessment.  She has been started on potassium supplement 10 mill equivalent twice a weekly dosing by her PCP, Dr. Alfonzo Angst plan to recheck blood work this Thursday.  ROS: ***  Studies Reviewed      *** Risk Assessment/Calculations {Does this patient have ATRIAL FIBRILLATION?[phone]}         Physical Exam VS:  BP (!) 82/60   Pulse 100   Ht 5\' 3"  (1.6 m)   Wt 125 lb 12.8 oz (57.1 kg)   SpO2 (!) 49%   BMI 22.28 kg/m    Wt Readings from Last 3 Encounters:  10/05/23 125 lb 12.8 oz (57.1 kg)  09/25/23 126 lb 9.6 oz (57.4 kg)  09/15/23 126 lb 9.6 oz (57.4 kg)    GEN: Well nourished, well developed in no acute distress NECK: No JVD; No carotid bruits CARDIAC: ***RRR, no murmurs, rubs, gallops RESPIRATORY:  Clear to auscultation without rales, wheezing or rhonchi  ABDOMEN: Soft, non-tender, non-distended EXTREMITIES:  No edema; No deformity   ASSESSMENT AND PLAN ***    {Are you ordering a CV Procedure (e.g. stress test, cath, DCCV, TEE, etc)?   Press F2        :[id number]}  Dispo: ***  Signed, Ervin Heath, PA

## 2023-10-07 DIAGNOSIS — I5023 Acute on chronic systolic (congestive) heart failure: Secondary | ICD-10-CM | POA: Diagnosis not present

## 2023-10-08 ENCOUNTER — Other Ambulatory Visit (HOSPITAL_BASED_OUTPATIENT_CLINIC_OR_DEPARTMENT_OTHER): Payer: Self-pay

## 2023-10-08 MED ORDER — MORPHINE SULFATE (CONCENTRATE) 10 MG /0.5 ML PO SOLN
5.0000 mg | ORAL | 0 refills | Status: DC | PRN
  Filled 2023-10-08: qty 30, 20d supply, fill #0

## 2023-10-08 MED ORDER — LORAZEPAM 0.5 MG PO TABS
0.5000 mg | ORAL_TABLET | ORAL | 0 refills | Status: AC | PRN
  Filled 2023-10-08: qty 31, 6d supply, fill #0
  Filled 2023-11-03: qty 31, 6d supply, fill #1

## 2023-10-09 ENCOUNTER — Other Ambulatory Visit (HOSPITAL_BASED_OUTPATIENT_CLINIC_OR_DEPARTMENT_OTHER): Payer: Self-pay

## 2023-10-12 ENCOUNTER — Other Ambulatory Visit: Payer: Self-pay

## 2023-10-12 ENCOUNTER — Other Ambulatory Visit (HOSPITAL_BASED_OUTPATIENT_CLINIC_OR_DEPARTMENT_OTHER): Payer: Self-pay

## 2023-10-12 MED ORDER — LEVOTHYROXINE SODIUM 75 MCG PO TABS
75.0000 ug | ORAL_TABLET | ORAL | 3 refills | Status: AC
  Filled 2023-10-12: qty 30, 30d supply, fill #0

## 2023-10-12 MED ORDER — MIDODRINE HCL 5 MG PO TABS
5.0000 mg | ORAL_TABLET | Freq: Three times a day (TID) | ORAL | 3 refills | Status: AC
  Filled 2023-10-12: qty 90, 30d supply, fill #0

## 2023-10-12 MED ORDER — METOPROLOL SUCCINATE ER 25 MG PO TB24
12.5000 mg | ORAL_TABLET | Freq: Every day | ORAL | 3 refills | Status: DC
  Filled 2023-10-12: qty 15, 30d supply, fill #0

## 2023-10-12 MED ORDER — FUROSEMIDE 20 MG PO TABS
20.0000 mg | ORAL_TABLET | Freq: Every day | ORAL | 3 refills | Status: AC
  Filled 2023-10-12: qty 30, 30d supply, fill #0

## 2023-10-12 MED ORDER — DAPAGLIFLOZIN PROPANEDIOL 10 MG PO TABS
10.0000 mg | ORAL_TABLET | Freq: Every day | ORAL | 3 refills | Status: DC
  Filled 2023-10-12: qty 30, 30d supply, fill #0

## 2023-10-12 MED ORDER — ELIQUIS 2.5 MG PO TABS
2.5000 mg | ORAL_TABLET | Freq: Two times a day (BID) | ORAL | 3 refills | Status: DC
  Filled 2023-10-12: qty 60, 30d supply, fill #0

## 2023-10-13 ENCOUNTER — Other Ambulatory Visit (HOSPITAL_BASED_OUTPATIENT_CLINIC_OR_DEPARTMENT_OTHER): Payer: Self-pay

## 2023-10-13 MED ORDER — RANOLAZINE ER 500 MG PO TB12
500.0000 mg | ORAL_TABLET | Freq: Two times a day (BID) | ORAL | 3 refills | Status: DC
  Filled 2023-10-13: qty 60, 30d supply, fill #0

## 2023-10-13 MED ORDER — ROSUVASTATIN CALCIUM 40 MG PO TABS
40.0000 mg | ORAL_TABLET | Freq: Every day | ORAL | 3 refills | Status: AC
  Filled 2023-10-13: qty 30, 30d supply, fill #0

## 2023-10-14 ENCOUNTER — Other Ambulatory Visit (HOSPITAL_BASED_OUTPATIENT_CLINIC_OR_DEPARTMENT_OTHER): Payer: Self-pay

## 2023-10-15 ENCOUNTER — Other Ambulatory Visit (HOSPITAL_BASED_OUTPATIENT_CLINIC_OR_DEPARTMENT_OTHER): Payer: Self-pay

## 2023-10-19 DIAGNOSIS — I4891 Unspecified atrial fibrillation: Secondary | ICD-10-CM | POA: Diagnosis not present

## 2023-10-21 DIAGNOSIS — I5023 Acute on chronic systolic (congestive) heart failure: Secondary | ICD-10-CM | POA: Diagnosis not present

## 2023-10-23 ENCOUNTER — Other Ambulatory Visit (HOSPITAL_BASED_OUTPATIENT_CLINIC_OR_DEPARTMENT_OTHER): Payer: Self-pay

## 2023-11-01 ENCOUNTER — Other Ambulatory Visit: Payer: Self-pay

## 2023-11-01 ENCOUNTER — Other Ambulatory Visit (HOSPITAL_BASED_OUTPATIENT_CLINIC_OR_DEPARTMENT_OTHER): Payer: Self-pay

## 2023-11-01 MED ORDER — SIMETHICONE 125 MG PO CHEW
125.0000 mg | CHEWABLE_TABLET | ORAL | 3 refills | Status: AC | PRN
  Filled 2023-11-01: qty 18, 10d supply, fill #0

## 2023-11-02 ENCOUNTER — Other Ambulatory Visit (HOSPITAL_BASED_OUTPATIENT_CLINIC_OR_DEPARTMENT_OTHER): Payer: Self-pay

## 2023-11-03 ENCOUNTER — Other Ambulatory Visit (HOSPITAL_BASED_OUTPATIENT_CLINIC_OR_DEPARTMENT_OTHER): Payer: Self-pay

## 2023-11-03 ENCOUNTER — Other Ambulatory Visit: Payer: Self-pay

## 2023-11-04 ENCOUNTER — Other Ambulatory Visit (HOSPITAL_BASED_OUTPATIENT_CLINIC_OR_DEPARTMENT_OTHER): Payer: Self-pay

## 2023-11-11 ENCOUNTER — Ambulatory Visit: Admitting: Physician Assistant

## 2023-11-25 DIAGNOSIS — I2510 Atherosclerotic heart disease of native coronary artery without angina pectoris: Secondary | ICD-10-CM | POA: Diagnosis not present

## 2023-12-08 ENCOUNTER — Other Ambulatory Visit (HOSPITAL_COMMUNITY): Payer: Self-pay | Admitting: *Deleted

## 2023-12-09 ENCOUNTER — Ambulatory Visit (HOSPITAL_COMMUNITY)
Admission: RE | Admit: 2023-12-09 | Discharge: 2023-12-09 | Disposition: A | Discharge status: Home / Self Care | Source: Ambulatory Visit | Attending: Internal Medicine | Admitting: Internal Medicine

## 2023-12-09 DIAGNOSIS — M810 Age-related osteoporosis without current pathological fracture: Secondary | ICD-10-CM | POA: Insufficient documentation

## 2023-12-09 MED ORDER — DENOSUMAB 60 MG/ML ~~LOC~~ SOSY
60.0000 mg | PREFILLED_SYRINGE | Freq: Once | SUBCUTANEOUS | Status: AC
  Administered 2023-12-09: 60 mg via SUBCUTANEOUS

## 2023-12-09 MED ORDER — DENOSUMAB 60 MG/ML ~~LOC~~ SOSY
PREFILLED_SYRINGE | SUBCUTANEOUS | Status: AC
  Filled 2023-12-09: qty 1

## 2023-12-27 DIAGNOSIS — E559 Vitamin D deficiency, unspecified: Secondary | ICD-10-CM | POA: Diagnosis not present

## 2023-12-27 DIAGNOSIS — E538 Deficiency of other specified B group vitamins: Secondary | ICD-10-CM | POA: Diagnosis not present

## 2023-12-27 DIAGNOSIS — E785 Hyperlipidemia, unspecified: Secondary | ICD-10-CM | POA: Diagnosis not present

## 2023-12-27 DIAGNOSIS — E039 Hypothyroidism, unspecified: Secondary | ICD-10-CM | POA: Diagnosis not present

## 2024-01-04 DIAGNOSIS — I2510 Atherosclerotic heart disease of native coronary artery without angina pectoris: Secondary | ICD-10-CM | POA: Diagnosis not present

## 2024-01-04 DIAGNOSIS — Z23 Encounter for immunization: Secondary | ICD-10-CM | POA: Diagnosis not present

## 2024-01-04 DIAGNOSIS — Z0000 Encounter for general adult medical examination without abnormal findings: Secondary | ICD-10-CM | POA: Diagnosis not present

## 2024-01-12 NOTE — Progress Notes (Unsigned)
 No chief complaint on file.  History of Present Illness: 89 yo female with history of CAD, LBBB, chronic systolic CHF, HLD and hypothyroidism who is here today for follow up. She has been followed in our office by Dr. Alveta. She had stenting of her RCA with a first generation Taxus DES in 2005. Cardiac cath in May 2025 with moderate LAD stenosis not felt to be flow limiting. She has LV systolic dysfunction with reduced LVEF dating back prior to 2018. Echo in May 2025 with LVEF=25-30%. No valve disease. Cardiac monitor in April 2025 with sinus with NSVT. She has been seen by EP and not felt to be a candidate for BiV ICD. She has orthostatic hypotension. She was admitted to [facility] in May 2025 with acute CHF exacerbation. She was diuresed and discharged on midodrine .   *** ? eliquis   She is here today for follow up. The patient denies any chest pain, dyspnea, palpitations, lower extremity edema, orthopnea, PND, dizziness, near syncope or syncope.   Primary Care Physician: Shayne Anes, MD   Past Medical History:  Diagnosis Date   Chronic HFrEF (heart failure with reduced ejection fraction) (HCC)    Coronary artery disease    PTCA/stenting of RCA 2005, moderate residual 70% stenosis of LAD stable by last cath 2021   Frequent PVCs    Hyperlipidemia    Hypothyroidism    LBBB (left bundle branch block)    Mild carotid artery disease (HCC)    Osteoporosis    Syncope     Past Surgical History:  Procedure Laterality Date   APPENDECTOMY  1970s?   ruptured    BACK SURGERY     CATARACT EXTRACTION W/ INTRAOCULAR LENS  IMPLANT, BILATERAL Bilateral    CORONARY ANGIOPLASTY WITH STENT PLACEMENT  ~ 2009   2 stents   FRACTURE SURGERY     IR VERTEBROPLASTY CERV/THOR BX INC UNI/BIL INC/INJECT/IMAGING  02/07/2018   KNEE ARTHROSCOPY Right     torn meniscus   LEFT HEART CATH AND CORONARY ANGIOGRAPHY N/A 03/21/2020   Procedure: LEFT HEART CATH AND CORONARY ANGIOGRAPHY;  Surgeon: Court Dorn PARAS,  MD;  Location: [facility] INVASIVE CV LAB;  Service: Cardiovascular;  Laterality: N/A;   LEFT HEART CATH AND CORONARY ANGIOGRAPHY N/A 09/07/2023   Procedure: LEFT HEART CATH AND CORONARY ANGIOGRAPHY;  Surgeon: Wonda Sharper, MD;  Location: [facility] INVASIVE CV LAB;  Service: Cardiovascular;  Laterality: N/A;   LUMBAR LAMINECTOMY  1978   OVARIAN CYST SURGERY  1970s?   ruptured    RIGHT/LEFT HEART CATH AND CORONARY ANGIOGRAPHY N/A 10/08/2017   Procedure: RIGHT/LEFT HEART CATH AND CORONARY ANGIOGRAPHY;  Surgeon: Mady Bruckner, MD;  Location: [facility] INVASIVE CV LAB;  Service: Cardiovascular;  Laterality: N/A;   WRIST FRACTURE SURGERY Bilateral    2 surgeries for 2 breaks on left side; 1 surgery for 1 break on right wrist    Current Outpatient Medications  Medication Sig Dispense Refill   acetaminophen  (TYLENOL ) 500 MG tablet Take 2 tablets (1,000 mg total) by mouth every 8 (eight) hours as needed.     apixaban  (ELIQUIS ) 2.5 MG TABS tablet Take 1 tablet (2.5 mg total) by mouth 2 (two) times daily. 60 tablet 3   apixaban  (ELIQUIS ) 2.5 MG TABS tablet Take 1 tablet (2.5 mg total) by mouth 2 (two) times daily for anticoagulant. 60 tablet 3   Calcium  Carbonate-Vitamin D  (CALCIUM  + D PO) Take 1 tablet by mouth 2 (two) times daily.     cholecalciferol  (VITAMIN D3) 25  MCG (1000 UT) tablet Take 1,000 Units by mouth daily after breakfast.     dapagliflozin  propanediol (FARXIGA ) 10 MG TABS tablet Take 1 tablet (10 mg total) by mouth daily. 30 tablet 3   dapagliflozin  propanediol (FARXIGA ) 10 MG TABS tablet Take 1 tablet (10 mg total) by mouth daily. 30 tablet 3   denosumab  (PROLIA ) 60 MG/ML SOSY injection Inject 60 mg into the skin every 6 (six) months.     furosemide  (LASIX ) 20 MG tablet Take 1 tablet (20 mg total) by mouth daily for 5 days. 5 tablet 0   furosemide  (LASIX ) 20 MG tablet Take 1 tablet (20 mg total) by mouth daily for CHF 30 tablet 3   levothyroxine  (SYNTHROID ) 75 MCG tablet Take 1 tablet by mouth once a  day for hypothyroidism. Except Sunday- take 1/2 tablet on Sunday 30 tablet 3   levothyroxine  (SYNTHROID , LEVOTHROID) 75 MCG tablet Take 37.5-75 mcg by mouth See admin instructions. Take 1 tablet by mouth daily, except on Sunday, take 1/2 tablet  0   LORazepam  (ATIVAN ) 0.5 MG tablet Take 1 tablet (0.5 mg total) by mouth every 4 (four) hours as needed for anxiety. 90 tablet 0   metoprolol  succinate (TOPROL -XL) 25 MG 24 hr tablet Take 0.5 tablets (12.5 mg total) by mouth daily. 30 tablet 3   metoprolol  succinate (TOPROL -XL) 25 MG 24 hr tablet Take 0.5 tablets (12.5 mg total) by mouth daily for hypertension. 15 tablet 3   midodrine  (PROAMATINE ) 10 MG tablet Take 1 tablet (10 mg total) by mouth 3 (three) times daily with meals. 90 tablet 1   midodrine  (PROAMATINE ) 5 MG tablet Take 1 tablet (5 mg total) by mouth 3 (three) times daily for orthostatic hypotension. 90 tablet 3   Morphine  Sulfate (MORPHINE  CONCENTRATE) 10 mg / 0.5 ml concentrated solution Take 0.25 mLs (5 mg total) by mouth every 4 (four) hours as needed for shortness of breath/chest pain. 30 mL 0   multivitamin-lutein (OCUVITE-LUTEIN) CAPS capsule Take 1 capsule by mouth 2 (two) times a day.      nitroGLYCERIN  (NITROSTAT ) 0.4 MG SL tablet Place 1 tablet (0.4 mg total) under the tongue every 5 (five) minutes as needed. (Patient taking differently: Place 0.4 mg under the tongue every 5 (five) minutes as needed for chest pain.) 25 tablet 3   potassium chloride  (KLOR-CON ) 10 MEQ tablet 1 tablet with food Orally once a day on sundays and wednesdays (2 days a week) for 90 days     ranolazine  (RANEXA ) 500 MG 12 hr tablet Take 1 tablet (500 mg total) by mouth 2 (two) times daily. 180 tablet 0   ranolazine  (RANEXA ) 500 MG 12 hr tablet Take 1 tablet (500 mg total) by mouth 2 (two) times daily for Angina 60 tablet 3   rosuvastatin  (CRESTOR ) 40 MG tablet Take 1 tablet (40 mg total) by mouth daily. 90 tablet 3   rosuvastatin  (CRESTOR ) 40 MG tablet Take 1  tablet (40 mg total) by mouth daily High Cholesterol 30 tablet 3   simethicone  (MYLICON) 125 MG chewable tablet Take 1 tablet by mouth as directed as needed symptoms of extra gas 18 tablet 3   venlafaxine  XR (EFFEXOR -XR) 150 MG 24 hr capsule Take 1 capsule (150 mg total) by mouth daily with breakfast. 90 capsule 2   vitamin B-12 (CYANOCOBALAMIN ) 1000 MCG tablet Take 2,000 mcg by mouth daily.      No current facility-administered medications for this visit.    No Known Allergies  Social History  Socioeconomic History   Marital status: Widowed    Spouse name: Not on file   Number of children: Not on file   Years of education: Not on file   Highest education level: Not on file  Occupational History   Not on file  Tobacco Use   Smoking status: Never   Smokeless tobacco: Never  Vaping Use   Vaping status: Never Used  Substance and Sexual Activity   Alcohol  use: Not Currently    Alcohol /week: 1.0 standard drink of alcohol     Types: 1 Glasses of wine per week   Drug use: Never   Sexual activity: Not on file  Other Topics Concern   Not on file  Social History Narrative   Not on file   Social Drivers of Health   Financial Resource Strain: Not on file  Food Insecurity: No Food Insecurity (09/14/2023)   Hunger Vital Sign    Worried About Running Out of Food in the Last Year: Never true    Ran Out of Food in the Last Year: Never true  Transportation Needs: No Transportation Needs (09/14/2023)   PRAPARE - Administrator, Civil Service (Medical): No    Lack of Transportation (Non-Medical): No  Physical Activity: Not on file  Stress: Not on file  Social Connections: Patient Declined (09/14/2023)   Social Connection and Isolation Panel    Frequency of Communication with Friends and Family: Patient declined    Frequency of Social Gatherings with Friends and Family: Patient declined    Attends Religious Services: Patient declined    Database administrator or  Organizations: Patient declined    Attends Banker Meetings: Patient declined    Marital Status: Patient declined  Intimate Partner Violence: Not At Risk (09/14/2023)   Humiliation, Afraid, Rape, and Kick questionnaire    Fear of Current or Ex-Partner: No    Emotionally Abused: No    Physically Abused: No    Sexually Abused: No    Family History  Problem Relation Age of Onset   Alzheimer's disease Mother    Heart attack Father    Aneurysm Sister     Review of Systems:  As stated in the HPI and otherwise negative.   There were no vitals taken for this visit.  Physical Examination: General: Well developed, well nourished, NAD  HEENT: OP clear, mucus membranes moist  SKIN: warm, dry. No rashes. Neuro: No focal deficits  Musculoskeletal: Muscle strength 5/5 all ext  Psychiatric: Mood and affect normal  Neck: No JVD, no carotid bruits, no thyromegaly, no lymphadenopathy.  Lungs:Clear bilaterally, no wheezes, rhonci, crackles Cardiovascular: Regular rate and rhythm. No murmurs, gallops or rubs. Abdomen:Soft. Bowel sounds present. Non-tender.  Extremities: No lower extremity edema. Pulses are 2 + in the bilateral DP/PT.  EKG:  EKG {ACTION; IS/IS WNU:[id number]} ordered today. The ekg ordered today demonstrates ***  Recent Labs: 09/13/2023: TSH 1.737 09/25/2023: ALT 23; B Natriuretic Peptide 2,302.9; BUN 22; Creatinine, Ser 1.10; Hemoglobin 13.0; Magnesium  2.8; Platelets 293; Potassium 4.5; Sodium 140   Lipid Panel    Component Value Date/Time   CHOL 122 08/31/2023 0443   TRIG 71 08/31/2023 0443   HDL 56 08/31/2023 0443   CHOLHDL 2.2 08/31/2023 0443   VLDL 14 08/31/2023 0443   LDLCALC 52 08/31/2023 0443     Wt Readings from Last 3 Encounters:  10/05/23 125 lb 12.8 oz (57.1 kg)  09/25/23 126 lb 9.6 oz (57.4 kg)  09/15/23 126 lb 9.6 oz (  57.4 kg)    Assessment and Plan:   1. CAD without angina: No chest pain. Continue ***  2. NICM/Chronic combined systolic  and diastolic CHF: LVEF=25-30% by echo in May 2025.   Labs/ tests ordered today include:  No orders of the defined types were placed in this encounter.    Disposition:   F/U with me in ***    Signed, Lonni Cash, MD, [facility] 01/12/2024 1:38 PM    [facility] [address] [place], [place], [place]  [address] Phone: [phone]; Fax: [phone]

## 2024-01-13 ENCOUNTER — Ambulatory Visit: Attending: Cardiovascular Disease | Admitting: Cardiovascular Disease

## 2024-01-13 ENCOUNTER — Encounter: Payer: Self-pay | Admitting: Cardiovascular Disease

## 2024-01-13 VITALS — BP 110/60 | HR 80 | Ht 63.5 in | Wt 115.0 lb

## 2024-01-13 DIAGNOSIS — I429 Cardiomyopathy, unspecified: Secondary | ICD-10-CM | POA: Diagnosis not present

## 2024-01-13 DIAGNOSIS — I5042 Chronic combined systolic (congestive) and diastolic (congestive) heart failure: Secondary | ICD-10-CM | POA: Diagnosis not present

## 2024-01-13 DIAGNOSIS — I951 Orthostatic hypotension: Secondary | ICD-10-CM

## 2024-01-13 DIAGNOSIS — E785 Hyperlipidemia, unspecified: Secondary | ICD-10-CM | POA: Diagnosis not present

## 2024-01-13 DIAGNOSIS — I2510 Atherosclerotic heart disease of native coronary artery without angina pectoris: Secondary | ICD-10-CM

## 2024-01-13 NOTE — Patient Instructions (Signed)
 Medication Instructions:  No medication changes were made at this visit. Continue current regimen.   *If you need a refill on your cardiac medications before your next appointment, please call your pharmacy*  Lab Work: None ordered today. If you have labs (blood work) drawn today and your tests are completely normal, you will receive your results only by: MyChart Message (if you have MyChart) OR A paper copy in the mail If you have any lab test that is abnormal or we need to change your treatment, we will call you to review the results.  Testing/Procedures: None ordered today.  Follow-Up: At [facility], you and your health needs are our priority.  As part of our continuing mission to provide you with exceptional heart care, our providers are all part of one team.  This team includes your primary Cardiologist (physician) and Advanced Practice Providers or APPs (Physician Assistants and Nurse Practitioners) who all work together to provide you with the care you need, when you need it.  Your next appointment:   4 month(s)  Provider:   Lonni Cash, MD

## 2024-03-03 ENCOUNTER — Other Ambulatory Visit (HOSPITAL_COMMUNITY): Payer: Self-pay

## 2024-03-03 MED ORDER — DAPAGLIFLOZIN PROPANEDIOL 10 MG PO TABS
10.0000 mg | ORAL_TABLET | Freq: Every day | ORAL | 4 refills | Status: AC
  Filled 2024-03-03 – 2024-04-06 (×3): qty 30, 30d supply, fill #0
  Filled 2024-04-06 – 2024-05-03 (×2): qty 30, 30d supply, fill #1

## 2024-03-06 ENCOUNTER — Other Ambulatory Visit: Payer: Self-pay

## 2024-03-06 ENCOUNTER — Other Ambulatory Visit (HOSPITAL_COMMUNITY): Payer: Self-pay

## 2024-03-06 ENCOUNTER — Other Ambulatory Visit (HOSPITAL_BASED_OUTPATIENT_CLINIC_OR_DEPARTMENT_OTHER): Payer: Self-pay

## 2024-03-07 ENCOUNTER — Other Ambulatory Visit: Payer: Self-pay

## 2024-03-17 ENCOUNTER — Other Ambulatory Visit (HOSPITAL_BASED_OUTPATIENT_CLINIC_OR_DEPARTMENT_OTHER): Payer: Self-pay

## 2024-03-17 MED ORDER — ELIQUIS 2.5 MG PO TABS
2.5000 mg | ORAL_TABLET | Freq: Two times a day (BID) | ORAL | 4 refills | Status: AC
  Filled 2024-03-17: qty 28, 14d supply, fill #0
  Filled 2024-04-03: qty 28, 14d supply, fill #1
  Filled 2024-04-21: qty 28, 14d supply, fill #2
  Filled 2024-05-10: qty 28, 14d supply, fill #3

## 2024-03-23 ENCOUNTER — Other Ambulatory Visit (HOSPITAL_BASED_OUTPATIENT_CLINIC_OR_DEPARTMENT_OTHER): Payer: Self-pay

## 2024-03-23 ENCOUNTER — Other Ambulatory Visit (HOSPITAL_COMMUNITY): Payer: Self-pay

## 2024-03-28 ENCOUNTER — Encounter: Payer: Self-pay | Admitting: Cardiovascular Disease

## 2024-04-03 ENCOUNTER — Other Ambulatory Visit (HOSPITAL_BASED_OUTPATIENT_CLINIC_OR_DEPARTMENT_OTHER): Payer: Self-pay

## 2024-04-06 ENCOUNTER — Other Ambulatory Visit (HOSPITAL_BASED_OUTPATIENT_CLINIC_OR_DEPARTMENT_OTHER): Payer: Self-pay

## 2024-04-21 ENCOUNTER — Other Ambulatory Visit (HOSPITAL_BASED_OUTPATIENT_CLINIC_OR_DEPARTMENT_OTHER): Payer: Self-pay

## 2024-05-03 ENCOUNTER — Other Ambulatory Visit (HOSPITAL_BASED_OUTPATIENT_CLINIC_OR_DEPARTMENT_OTHER): Payer: Self-pay

## 2024-05-10 ENCOUNTER — Other Ambulatory Visit (HOSPITAL_BASED_OUTPATIENT_CLINIC_OR_DEPARTMENT_OTHER): Payer: Self-pay

## 2024-05-29 ENCOUNTER — Ambulatory Visit: Admitting: Cardiology

## 2024-06-05 ENCOUNTER — Ambulatory Visit: Admitting: Cardiovascular Disease

## 2024-06-06 ENCOUNTER — Other Ambulatory Visit (HOSPITAL_BASED_OUTPATIENT_CLINIC_OR_DEPARTMENT_OTHER): Payer: Self-pay

## 2024-06-06 MED ORDER — DAPAGLIFLOZIN PROPANEDIOL 10 MG PO TABS
10.0000 mg | ORAL_TABLET | Freq: Every day | ORAL | 4 refills | Status: AC
  Filled 2024-06-06: qty 30, 30d supply, fill #0

## 2024-06-08 ENCOUNTER — Telehealth: Payer: Self-pay | Admitting: Cardiovascular Disease

## 2024-06-08 ENCOUNTER — Other Ambulatory Visit (HOSPITAL_BASED_OUTPATIENT_CLINIC_OR_DEPARTMENT_OTHER): Payer: Self-pay

## 2024-06-08 NOTE — Telephone Encounter (Signed)
 Patient says she is beginning hospice. She insists she must see Dr. Verlin only, not an APP. 02/02 appointment was cancelled due to the office being closed. She says she already waited a while for previous appointment and would like to know if she can be worked in. Please advise.

## 2024-06-09 ENCOUNTER — Other Ambulatory Visit (HOSPITAL_COMMUNITY): Payer: Self-pay | Admitting: Internal Medicine

## 2024-06-09 ENCOUNTER — Telehealth (HOSPITAL_COMMUNITY): Payer: Self-pay | Admitting: Pharmacy Technician

## 2024-06-09 NOTE — Telephone Encounter (Signed)
 Auth Submission: NO AUTH NEEDED Site of care: [facility] Payer: BCBS MEDICARE Medication & CPT/J Code(s) submitted: Stoboclo (denosumab -bmwo) [id number] Diagnosis Code: M81.0 Route of submission (phone, fax, portal):  Phone # Fax # Auth type: Buy/Bill HB Units/visits requested: 60mg  x 2 doses, q 6 months Reference number:  Approval from: 06/09/2024 to 05/03/25    Dagoberto Armour, CPhT [facility] Phone: [phone] 06/09/2024

## 2024-06-09 NOTE — Progress Notes (Signed)
 Received updated denosumab  orders  NOTE: Per [facility] practice, MD approved biosimilars switch for Prolia  per payor preferences.  Last dose 12/09/2023 Next dose due 07/08/2024  Calcium  wnl on 05/31/2024 (attached to referral)  Sherry Pennant, PharmD, MPH, BCPS, CPP Clinical Pharmacist

## 2024-06-09 NOTE — Addendum Note (Signed)
 Addended by: DAYNE SHERRY RAMAN on: 06/09/2024 04:04 PM   Modules accepted: Orders

## 2024-09-12 ENCOUNTER — Ambulatory Visit: Admitting: Cardiovascular Disease
